# Patient Record
Sex: Male | Born: 1950 | Race: Black or African American | Hispanic: No | Marital: Married | Smoking: Former smoker
Health system: Southern US, Community
[De-identification: ages and names within clinical notes are randomized; demographics above are authoritative.]

## PROBLEM LIST (undated history)

## (undated) DIAGNOSIS — I509 Heart failure, unspecified: Secondary | ICD-10-CM

## (undated) DIAGNOSIS — K5792 Diverticulitis of intestine, part unspecified, without perforation or abscess without bleeding: Secondary | ICD-10-CM

## (undated) DIAGNOSIS — C801 Malignant (primary) neoplasm, unspecified: Secondary | ICD-10-CM

## (undated) DIAGNOSIS — I10 Essential (primary) hypertension: Secondary | ICD-10-CM

## (undated) DIAGNOSIS — I452 Bifascicular block: Secondary | ICD-10-CM

## (undated) DIAGNOSIS — K635 Polyp of colon: Secondary | ICD-10-CM

## (undated) DIAGNOSIS — M1990 Unspecified osteoarthritis, unspecified site: Secondary | ICD-10-CM

## (undated) DIAGNOSIS — I2510 Atherosclerotic heart disease of native coronary artery without angina pectoris: Secondary | ICD-10-CM

## (undated) DIAGNOSIS — Z87442 Personal history of urinary calculi: Secondary | ICD-10-CM

## (undated) DIAGNOSIS — K589 Irritable bowel syndrome without diarrhea: Secondary | ICD-10-CM

## (undated) DIAGNOSIS — I200 Unstable angina: Secondary | ICD-10-CM

## (undated) DIAGNOSIS — E119 Type 2 diabetes mellitus without complications: Secondary | ICD-10-CM

## (undated) HISTORY — DX: Polyp of colon: K63.5

## (undated) HISTORY — DX: Diverticulitis of intestine, part unspecified, without perforation or abscess without bleeding: K57.92

## (undated) HISTORY — DX: Essential (primary) hypertension: I10

## (undated) HISTORY — PX: OTHER SURGICAL HISTORY: SHX169

## (undated) HISTORY — PX: BREAST SURGERY: SHX581

## (undated) HISTORY — DX: Irritable bowel syndrome, unspecified: K58.9

## (undated) HISTORY — DX: Atherosclerotic heart disease of native coronary artery without angina pectoris: I25.10

## (undated) HISTORY — PX: PENILE PROSTHESIS IMPLANT: SHX240

## (undated) HISTORY — PX: APPENDECTOMY: SHX54

## (undated) HISTORY — PX: COLON SURGERY: SHX602

## (undated) HISTORY — PX: ABDOMINAL SURGERY: SHX537

---

## 1998-08-24 ENCOUNTER — Encounter: Admission: RE | Admit: 1998-08-24 | Discharge: 1998-11-22 | Payer: Self-pay | Admitting: Anesthesiology

## 2003-01-04 ENCOUNTER — Inpatient Hospital Stay (HOSPITAL_COMMUNITY): Admission: AD | Admit: 2003-01-04 | Discharge: 2003-01-07 | Payer: Self-pay | Admitting: Internal Medicine

## 2003-02-20 ENCOUNTER — Ambulatory Visit (HOSPITAL_COMMUNITY): Admission: RE | Admit: 2003-02-20 | Discharge: 2003-02-20 | Payer: Self-pay | Admitting: Internal Medicine

## 2004-11-12 ENCOUNTER — Ambulatory Visit: Payer: Self-pay | Admitting: Internal Medicine

## 2004-11-18 ENCOUNTER — Ambulatory Visit: Payer: Self-pay | Admitting: Internal Medicine

## 2004-12-06 ENCOUNTER — Ambulatory Visit: Payer: Self-pay | Admitting: Internal Medicine

## 2004-12-06 ENCOUNTER — Ambulatory Visit (HOSPITAL_COMMUNITY): Admission: RE | Admit: 2004-12-06 | Discharge: 2004-12-06 | Payer: Self-pay | Admitting: Internal Medicine

## 2004-12-20 ENCOUNTER — Ambulatory Visit: Payer: Self-pay | Admitting: Internal Medicine

## 2005-12-22 ENCOUNTER — Encounter: Payer: Self-pay | Admitting: Internal Medicine

## 2005-12-25 ENCOUNTER — Ambulatory Visit: Payer: Self-pay | Admitting: Cardiology

## 2005-12-30 ENCOUNTER — Ambulatory Visit: Payer: Self-pay | Admitting: Cardiology

## 2006-01-12 ENCOUNTER — Ambulatory Visit: Payer: Self-pay | Admitting: Cardiology

## 2006-01-29 ENCOUNTER — Ambulatory Visit: Payer: Self-pay | Admitting: Cardiology

## 2007-10-08 ENCOUNTER — Encounter: Payer: Self-pay | Admitting: Family Medicine

## 2011-03-06 ENCOUNTER — Ambulatory Visit (INDEPENDENT_AMBULATORY_CARE_PROVIDER_SITE_OTHER): Payer: BC Managed Care – PPO | Admitting: Internal Medicine

## 2011-03-06 ENCOUNTER — Encounter (INDEPENDENT_AMBULATORY_CARE_PROVIDER_SITE_OTHER): Payer: Self-pay | Admitting: Internal Medicine

## 2011-03-06 ENCOUNTER — Telehealth (INDEPENDENT_AMBULATORY_CARE_PROVIDER_SITE_OTHER): Payer: Self-pay | Admitting: *Deleted

## 2011-03-06 ENCOUNTER — Other Ambulatory Visit (INDEPENDENT_AMBULATORY_CARE_PROVIDER_SITE_OTHER): Payer: Self-pay | Admitting: *Deleted

## 2011-03-06 ENCOUNTER — Encounter (INDEPENDENT_AMBULATORY_CARE_PROVIDER_SITE_OTHER): Payer: Self-pay | Admitting: *Deleted

## 2011-03-06 VITALS — BP 102/62 | Temp 98.1°F | Ht 67.5 in | Wt 173.4 lb

## 2011-03-06 DIAGNOSIS — K625 Hemorrhage of anus and rectum: Secondary | ICD-10-CM

## 2011-03-06 DIAGNOSIS — D126 Benign neoplasm of colon, unspecified: Secondary | ICD-10-CM

## 2011-03-06 DIAGNOSIS — Z800 Family history of malignant neoplasm of digestive organs: Secondary | ICD-10-CM

## 2011-03-06 DIAGNOSIS — K635 Polyp of colon: Secondary | ICD-10-CM

## 2011-03-06 MED ORDER — PEG-KCL-NACL-NASULF-NA ASC-C 100 G PO SOLR: 1.0000 | Freq: Once | ORAL | Status: DC

## 2011-03-06 NOTE — Progress Notes (Signed)
Subjective:     Patient ID: Ronald Faulkner, male   DOB: 1950-09-21, 61 y.o.   MRN: [id number]  HPI Ronald Faulkner is a 60 yr old male referred to our office by Samuel Jester for rectal bleeding. He tells me he has seen a streak of blood down his stool and he has seen blood when he wiped. N Sometimes hiis stools are very hard. Noticed 2-3 months ago and last for a couple of days.  He saw the blood a couple of days ago.  There is a family hx of colon cancer in a first degree relative ((mother age 74).  He tells me that 2007 he has 12 inches of his colon removed due to diverticulitis by Dr. Cleotis Nipper.   His last colonoscopy was in 2008: no colonic or rectal polyps. Normal colon otherwise. Mild diverticulosis in the transverse and left colon. .  He has a hx of colon adenomas polyps.   Appetite is good. No weight loss. Sometimes if he eats he will become nauseated and sometimes if he doesn't eat he becomes nauseated. His BMs are normal. Stools are normal caliber.  Sometimes it hurts to have a BM.  He occasionally has diarrhea.  Review of Systems  See hpi Current Outpatient Prescriptions  Medication Sig Dispense Refill  . diphenhydrAMINE (BENADRYL) 25 mg capsule Take 25 mg by mouth every 6 (six) hours as needed.        Marland Kitchen ibuprofen (ADVIL,MOTRIN) 200 MG tablet Take 200 mg by mouth every 6 (six) hours as needed.        Marland Kitchen olmesartan-hydrochlorothiazide (BENICAR HCT) 20-12.5 MG per tablet Take 1 tablet by mouth daily.         Past Surgical History  Procedure Date  . Appendectomy   . Colon surgery   . Breast surgery     benign   Past Medical History  Diagnosis Date  . Hypertension    No family history on file. Family Status  Relation Status Death Age  . Mother Deceased     colon cancer  . Father Deceased     COPD  . Sister Alive     good health  . Brother Deceased     MVC  . Child Alive     son is a diabetic   History   Social History  . Marital Status: Married    Spouse Name: N/A    Number  of Children: N/A  . Years of Education: N/A   Occupational History  . Not on file.   Social History Main Topics  . Smoking status: Never Smoker   . Smokeless tobacco: Not on file  . Alcohol Use: Yes     One beer a day  . Drug Use: No  . Sexually Active: Not on file   Other Topics Concern  . Not on file   Social History Narrative  . No narrative on file   Allergies  Allergen Reactions  . Fish Allergy        Objective:   Physical Exam   Filed Vitals:   03/06/11 1538  BP: 102/62  Temp: 98.1 F (36.7 C)  Height: 5' 7.5" (1.715 m)  Weight: 173 lb 6.4 oz (78.654 kg)    Alert and oriented. Skin warm and dry. Oral mucosa is moist.  Dentures. Sclera anicteric, conjunctivae is pink. Thyroid not enlarged. No cervical lymphadenopathy. Lungs clear. Heart regular rate and rhythm.  Abdomen is soft. Bowel sounds are positive. No hepatomegaly. No abdominal masses felt.  Stool brown and guaiac negative. No tenderness.  No edema to lower extremities. Patient is alert and oriented.     Assessment:    Rectal bleeding. Family hx of colon cancer. Colonic neoplasm needs to be ruled out. Also has hx of colonic polyps.     Plan:      Colonoscopy.   The risks and benefits such as perforation, bleeding, and infection were reviewed with the patient and is agreeable.   Patient states he is satisfied with the care he received today.

## 2011-03-07 NOTE — Telephone Encounter (Signed)
rx filled by Delrae Rend

## 2011-03-13 ENCOUNTER — Encounter (INDEPENDENT_AMBULATORY_CARE_PROVIDER_SITE_OTHER): Payer: Self-pay | Admitting: *Deleted

## 2011-03-13 ENCOUNTER — Other Ambulatory Visit (INDEPENDENT_AMBULATORY_CARE_PROVIDER_SITE_OTHER): Payer: Self-pay | Admitting: Internal Medicine

## 2011-03-13 MED ORDER — SODIUM CHLORIDE 0.45 % IV SOLN: Freq: Once | INTRAVENOUS | Status: DC

## 2011-03-13 MED ORDER — PEG-KCL-NACL-NASULF-NA ASC-C 100 G PO SOLR: 1.0000 | Freq: Once | ORAL | Status: DC

## 2011-03-13 NOTE — Telephone Encounter (Signed)
This encounter was created in error - please disregard.

## 2011-03-14 ENCOUNTER — Encounter (HOSPITAL_COMMUNITY): Payer: Self-pay | Admitting: *Deleted

## 2011-03-14 ENCOUNTER — Ambulatory Visit (HOSPITAL_COMMUNITY)
Admission: RE | Admit: 2011-03-14 | Discharge: 2011-03-14 | Disposition: A | Discharge status: Home / Self Care | Payer: BC Managed Care – PPO | Source: Ambulatory Visit | Attending: Internal Medicine | Admitting: Internal Medicine

## 2011-03-14 ENCOUNTER — Encounter (HOSPITAL_COMMUNITY)
Admission: RE | Disposition: A | Discharge status: Home / Self Care | Payer: Self-pay | Source: Ambulatory Visit | Attending: Internal Medicine

## 2011-03-14 DIAGNOSIS — K5730 Diverticulosis of large intestine without perforation or abscess without bleeding: Secondary | ICD-10-CM

## 2011-03-14 DIAGNOSIS — Z860100 Personal history of colon polyps, unspecified: Secondary | ICD-10-CM | POA: Insufficient documentation

## 2011-03-14 DIAGNOSIS — I10 Essential (primary) hypertension: Secondary | ICD-10-CM | POA: Insufficient documentation

## 2011-03-14 DIAGNOSIS — Z800 Family history of malignant neoplasm of digestive organs: Secondary | ICD-10-CM

## 2011-03-14 DIAGNOSIS — K921 Melena: Secondary | ICD-10-CM | POA: Insufficient documentation

## 2011-03-14 DIAGNOSIS — K625 Hemorrhage of anus and rectum: Secondary | ICD-10-CM

## 2011-03-14 DIAGNOSIS — Z79899 Other long term (current) drug therapy: Secondary | ICD-10-CM | POA: Insufficient documentation

## 2011-03-14 DIAGNOSIS — K644 Residual hemorrhoidal skin tags: Secondary | ICD-10-CM

## 2011-03-14 DIAGNOSIS — K648 Other hemorrhoids: Secondary | ICD-10-CM | POA: Insufficient documentation

## 2011-03-14 HISTORY — DX: Unspecified osteoarthritis, unspecified site: M19.90

## 2011-03-14 HISTORY — PX: COLONOSCOPY: SHX5424

## 2011-03-14 SURGERY — COLONOSCOPY
Anesthesia: Moderate Sedation

## 2011-03-14 MED ORDER — MEPERIDINE HCL 50 MG/ML IJ SOLN
INTRAMUSCULAR | Status: AC
  Filled 2011-03-14: qty 1

## 2011-03-14 MED ORDER — MIDAZOLAM HCL 5 MG/5ML IJ SOLN
INTRAMUSCULAR | Status: AC
  Filled 2011-03-14: qty 10

## 2011-03-14 MED ORDER — STERILE WATER FOR IRRIGATION IR SOLN
Status: DC | PRN
  Administered 2011-03-14: 15:00:00

## 2011-03-14 MED ORDER — MEPERIDINE HCL 50 MG/ML IJ SOLN
INTRAMUSCULAR | Status: DC | PRN
  Administered 2011-03-14 (×2): 25 mg via INTRAVENOUS

## 2011-03-14 MED ORDER — MIDAZOLAM HCL 5 MG/5ML IJ SOLN
INTRAMUSCULAR | Status: DC | PRN
  Administered 2011-03-14 (×4): 2 mg via INTRAVENOUS

## 2011-03-14 NOTE — Op Note (Signed)
COLONOSCOPY PROCEDURE REPORT  PATIENT:  Ronald Faulkner  MR#:  [id number] Birthdate:  02-22-1951, 60 y.o., male Endoscopist:  Dr. Malissa Hippo, MD Referred By:  Dr. Samuel Jester,  DO Procedure Date: 03/14/2011  Procedure:   Colonoscopy  Indications:  Patient is 60 year old African American male with intermittent hematochezia history of colonic polyps and family history of colon carcinoma. His last colonoscopy was about 4 years ago.  Informed Consent: Procedure and risks were reviewed with the patient. His questions were answered and informed consent was obtained. Medications:  Demerol 50 mg IV Versed 10 mg IV  Description of procedure:  After a digital rectal exam was performed, that colonoscope was advanced from the anus through the rectum and colon to the area of the cecum, ileocecal valve and appendiceal orifice. The cecum was deeply intubated. These structures were well-seen and photographed for the record. From the level of the cecum and ileocecal valve, the scope was slowly and cautiously withdrawn. The mucosal surfaces were carefully surveyed utilizing scope tip to flexion to facilitate fold flattening as needed. The scope was pulled down into the rectum where a thorough exam including retroflexion was performed. TI was also examined.  Findings:   Prep excellent. Normal terminal ileum. Scattered diverticula throughout the colon. Wide open colonic anastomosis at 18 cm from the anal margin. Hemorrhoids below the dentate line.  Therapeutic/Diagnostic Maneuvers Performed:  None  Complications:  None  Cecal Withdrawal Time:  8 minutes  Impression:  Normal terminal ileum.[place] colonic diverticulosis. Wide open colonic anastomosis at 18 cm from anal margin. External hemorrhoids.  Recommendations:  Standard instructions given. High fiber diet plus fiber supplement 3-4 g daily. Next colonoscopy would be in 5 years.  REHMAN,NAJEEB U  03/14/2011 2:52 PM  CC: Dr.  Samuel Jester, DO, DO & Dr. No ref. provider found

## 2011-03-14 NOTE — H&P (Signed)
This is an update to history and physical from last week.. Since medications are history not changed since his last visit. He has intermittent hematochezia constipation, or street of colonic polyps and family history of colon carcinoma. His mother died at age 61 within 3 months of diagnosis was she had advanced disease

## 2011-03-25 ENCOUNTER — Encounter (HOSPITAL_COMMUNITY): Payer: Self-pay | Admitting: Internal Medicine

## 2011-10-13 ENCOUNTER — Encounter (INDEPENDENT_AMBULATORY_CARE_PROVIDER_SITE_OTHER): Payer: Self-pay

## 2011-11-03 ENCOUNTER — Other Ambulatory Visit: Payer: Self-pay | Admitting: *Deleted

## 2011-12-08 ENCOUNTER — Encounter: Payer: Self-pay | Admitting: Cardiology

## 2011-12-08 ENCOUNTER — Ambulatory Visit (INDEPENDENT_AMBULATORY_CARE_PROVIDER_SITE_OTHER): Payer: BC Managed Care – PPO | Admitting: Cardiology

## 2011-12-08 ENCOUNTER — Encounter: Payer: Self-pay | Admitting: *Deleted

## 2011-12-08 VITALS — BP 123/83 | HR 103 | Ht 67.5 in | Wt 176.8 lb

## 2011-12-08 DIAGNOSIS — R079 Chest pain, unspecified: Secondary | ICD-10-CM

## 2011-12-08 DIAGNOSIS — I10 Essential (primary) hypertension: Secondary | ICD-10-CM | POA: Insufficient documentation

## 2011-12-08 DIAGNOSIS — Z136 Encounter for screening for cardiovascular disorders: Secondary | ICD-10-CM

## 2011-12-08 DIAGNOSIS — R0789 Other chest pain: Secondary | ICD-10-CM

## 2011-12-08 DIAGNOSIS — R9431 Abnormal electrocardiogram [ECG] [EKG]: Secondary | ICD-10-CM

## 2011-12-08 HISTORY — DX: Abnormal electrocardiogram (ECG) (EKG): R94.31

## 2011-12-08 HISTORY — DX: Other chest pain: R07.89

## 2011-12-08 HISTORY — DX: Essential (primary) hypertension: I10

## 2011-12-08 NOTE — Assessment & Plan Note (Signed)
I will have a low threshold for an echocardiogram given the abnormal EKG.

## 2011-12-08 NOTE — Assessment & Plan Note (Signed)
The blood pressure is at target. No change in medications is indicated. We will continue with therapeutic lifestyle changes (TLC).  

## 2011-12-08 NOTE — Assessment & Plan Note (Signed)
His chest pain has some atypical greater than typical features. He does need screening with a stress test. Given the baseline abnormal EKG he will need a Lexiscan Myoview.

## 2011-12-08 NOTE — Progress Notes (Signed)
HPI The patient presents for evaluation of chest discomfort. His only past history includes palpitations and arrhythmia some years ago. He reports wearing the monitor in the past. He is referred now for evaluation of chest discomfort. This has been going on for a few weeks. He describes a left-sided pain under his left breast and into the left axilla.  This happens sporadically. He had a severe episode of a Saturday night. It lasted for about 5 minutes. He was a little bit nauseated and diaphoretic. He has some left hand numbness. 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 He thinks this is slowly progressive.  He does not describe substernal pressure, shoulder or arm discomfort or jaw discomfort. He does get some dyspnea with activity but is not describing PND or orthopnea. She's not noticing any palpitations, presyncope or syncope.  Allergies  Allergen Reactions  . Bee Venom   . Fish Allergy   . Ivp Dye (Iodinated Diagnostic Agents) Swelling    Current Outpatient Prescriptions  Medication Sig Dispense Refill  . allopurinol (ZYLOPRIM) 100 MG tablet Take 100 mg by mouth daily.       . Cholecalciferol 5000 UNITS TABS Take 5,000 Units by mouth daily.      . diphenhydrAMINE (BENADRYL) 25 mg capsule Take 50 mg by mouth as needed.       Marland Kitchen ibuprofen (ADVIL,MOTRIN) 200 MG tablet Take 200 mg by mouth every 6 (six) hours as needed.        Marland Kitchen olmesartan-hydrochlorothiazide (BENICAR HCT) 20-12.5 MG per tablet Take 1 tablet by mouth daily.        . simvastatin (ZOCOR) 40 MG tablet Take 40 mg by mouth at bedtime.         Past Medical History  Diagnosis Date  . Hypertension   . Arthritis   . Colon polyps   . Diverticulitis   . IBS (irritable bowel syndrome)     Past Surgical History  Procedure Date  . Appendectomy   . Colon surgery   . Breast surgery     benign lump at lt side  . Colonoscopy  03/14/2011    Procedure: COLONOSCOPY;  Surgeon: Malissa Hippo, MD;  Location: [facility];  Service: Endoscopy;  Laterality: N/A;  1:00    Family History  Problem Relation Age of Onset  . Colon cancer Mother   . Anesthesia problems Neg Hx   . Hypotension Neg Hx   . Malignant hyperthermia Neg Hx   . Pseudochol deficiency Neg Hx   . Coronary artery disease Maternal Grandmother     History   Social History  . Marital Status: Married    Spouse Name: N/A    Number of Children: 2  . Years of Education: N/A   Occupational History  .  [employer]   Social History Main Topics  . Smoking status: Former Smoker -- 1.0 packs/day for 35 years    Types: Cigarettes    Quit date: 06/03/1999  . Smokeless tobacco: Not on file  . Alcohol Use: 5.4 oz/week    5 Cans of beer, 1 Glasses of wine, 3 Shots of liquor per week  . Drug Use: No     hx of use in his colege days  . Sexually Active: Not on file   Other Topics Concern  . Not on file   Social History Narrative   Lives at home with  wife and daughter and her three children.      ROS:  As stated in the HPI and negative for all other systems.   PHYSICAL EXAM BP 123/83  Pulse 103  Ht 5' 7.5" (1.715 m)  Wt 176 lb 12.8 oz (80.196 kg)  BMI 27.28 kg/m2  SpO2 96% GENERAL:  Well appearing HEENT:  Pupils equal round and reactive, fundi not visualized, oral mucosa unremarkable, , upper denture NECK:  No jugular venous distention, waveform within normal limits, carotid upstroke brisk and symmetric, no bruits, no thyromegaly LYMPHATICS:  No cervical, inguinal adenopathy LUNGS:  Clear to auscultation bilaterally BACK:  No CVA tenderness CHEST:  Unremarkable HEART:  PMI not displaced or sustained,S1 and S2 within normal limits, no S3, no S4, no clicks, no rubs, no murmurs ABD:  Flat, positive bowel sounds normal in frequency in pitch, no bruits, no rebound, no guarding, no midline pulsatile mass, no hepatomegaly, no  splenomegaly EXT:  2 plus pulses throughout, no edema, no cyanosis no clubbing SKIN:  No rashes no nodules NEURO:  Cranial nerves II through XII grossly intact, motor grossly intact throughout [facility]:  Cognitively intact, oriented to person place and time  EKG:  NSR rate 103, RBBB.  12/08/2011  ASSESSMENT AND PLAN

## 2011-12-08 NOTE — Patient Instructions (Addendum)
Your physician recommends that you continue on your current medications as directed. Please refer to the Current Medication list given to you today. Your physician has requested that you have a lexiscan myoview. For further information please visit [email or web address]. Please follow instruction sheet, as given. We will call you with your results. 

## 2011-12-09 ENCOUNTER — Telehealth: Payer: Self-pay

## 2011-12-09 ENCOUNTER — Other Ambulatory Visit: Payer: Self-pay | Admitting: Cardiology

## 2011-12-09 DIAGNOSIS — R079 Chest pain, unspecified: Secondary | ICD-10-CM

## 2011-12-09 DIAGNOSIS — R9431 Abnormal electrocardiogram [ECG] [EKG]: Secondary | ICD-10-CM

## 2011-12-09 NOTE — Telephone Encounter (Signed)
LEXISCAN MYOVIEW scheduled for 7-[facility] Checking percert

## 2011-12-10 NOTE — Telephone Encounter (Signed)
Auth # [id number] exp 12/10/12. Per [place] @ [phone].

## 2011-12-11 DIAGNOSIS — R079 Chest pain, unspecified: Secondary | ICD-10-CM

## 2011-12-16 ENCOUNTER — Telehealth: Payer: Self-pay | Admitting: *Deleted

## 2011-12-16 DIAGNOSIS — R079 Chest pain, unspecified: Secondary | ICD-10-CM

## 2011-12-16 DIAGNOSIS — R9431 Abnormal electrocardiogram [ECG] [EKG]: Secondary | ICD-10-CM

## 2011-12-16 NOTE — Telephone Encounter (Signed)
Left message for patient to call office.  

## 2011-12-16 NOTE — Telephone Encounter (Signed)
Patient informed. Awaiting echo to be scheduled.

## 2011-12-16 NOTE — Telephone Encounter (Signed)
Message copied by Eustace Moore on Tue Dec 16, 2011 10:56 AM ------      Message from: Rollene Rotunda      Created: Fri Dec 12, 2011  4:08 PM       No evidence of ischemia.  However, abnormal EKG and possible RVH on myoview.  Check echocardiogram.

## 2011-12-31 ENCOUNTER — Other Ambulatory Visit: Payer: Self-pay

## 2011-12-31 ENCOUNTER — Other Ambulatory Visit (INDEPENDENT_AMBULATORY_CARE_PROVIDER_SITE_OTHER): Payer: BC Managed Care – PPO

## 2011-12-31 DIAGNOSIS — R9431 Abnormal electrocardiogram [ECG] [EKG]: Secondary | ICD-10-CM

## 2011-12-31 DIAGNOSIS — R079 Chest pain, unspecified: Secondary | ICD-10-CM

## 2012-04-05 ENCOUNTER — Ambulatory Visit (INDEPENDENT_AMBULATORY_CARE_PROVIDER_SITE_OTHER): Payer: BC Managed Care – PPO | Admitting: Internal Medicine

## 2012-04-05 ENCOUNTER — Encounter (INDEPENDENT_AMBULATORY_CARE_PROVIDER_SITE_OTHER): Payer: Self-pay | Admitting: Internal Medicine

## 2012-04-05 VITALS — BP 102/74 | HR 80 | Temp 97.6°F | Ht 67.5 in | Wt 174.8 lb

## 2012-04-05 DIAGNOSIS — K625 Hemorrhage of anus and rectum: Secondary | ICD-10-CM

## 2012-04-05 DIAGNOSIS — K219 Gastro-esophageal reflux disease without esophagitis: Secondary | ICD-10-CM

## 2012-04-05 HISTORY — DX: Hemorrhage of anus and rectum: K62.5

## 2012-04-05 MED ORDER — LANSOPRAZOLE 30 MG PO CPDR: 30.0000 mg | DELAYED_RELEASE_CAPSULE | Freq: Every day | ORAL | Status: DC

## 2012-04-05 MED ORDER — HYDROCORTISONE ACETATE 25 MG RE SUPP: 25.0000 mg | Freq: Two times a day (BID) | RECTAL | Status: AC

## 2012-04-05 NOTE — Patient Instructions (Addendum)
OV in 3 weeks. Anusol supp x 2 weeks. Stool diary.

## 2012-04-05 NOTE — Progress Notes (Signed)
Subjective:     Patient ID: Ronald Faulkner, male   DOB: Mar 27, 1951, 61 y.o.   MRN: [id number]  HPIPresents today with c/o that he has been seeing blood streaks in his stool. He also says when he wipes he sometimes sees blood. He has seen streaks of blood in his stools for a couple of months. This comes and goes. Stools are dark brown to light brown.  He has left lower quadrant pain off and on . He has the pain once or twice a week. No fever associated with the pain. He also has lower back pain. Appetite is good. No weight loss. Sometimes when he eats he becomes nauseated. He also tells me when he has a good bowel movement he becomes hot. He then may have diarrhea. He has a hx of polyps. Also c/o acid reflux. Mother deceased from colon cancer at age 4.  03/14/2011 Colonoscopy for rectal bleeding: Findings:  Prep excellent.  Normal terminal ileum.  Scattered diverticula throughout the colon.  Wide open colonic anastomosis at 18 cm from the anal margin.  Hemorrhoids below the dentate line.   He tells me that 2007 he has 12 inches of his colon removed due to diverticulitis by Dr. Cleotis Nipper.      Review of Systems see hpi Current Outpatient Prescriptions  Medication Sig Dispense Refill  . allopurinol (ZYLOPRIM) 100 MG tablet Take 100 mg by mouth daily.       . diphenhydrAMINE (BENADRYL) 25 mg capsule Take 50 mg by mouth as needed.       Marland Kitchen ibuprofen (ADVIL,MOTRIN) 200 MG tablet Take 200 mg by mouth every 6 (six) hours as needed.        Marland Kitchen olmesartan-hydrochlorothiazide (BENICAR HCT) 20-12.5 MG per tablet Take 1 tablet by mouth daily.        . Cholecalciferol 5000 UNITS TABS Take 5,000 Units by mouth daily.      . simvastatin (ZOCOR) 40 MG tablet Take 40 mg by mouth at bedtime.        Past Medical History  Diagnosis Date  . Hypertension   . Arthritis   . Colon polyps   . Diverticulitis   . IBS (irritable bowel syndrome)    History   Social History  . Marital Status: Married   Spouse Name: N/A    Number of Children: 2  . Years of Education: N/A   Occupational History  .  [employer]   Social History Main Topics  . Smoking status: Former Smoker -- 1.0 packs/day for 35 years    Types: Cigarettes    Quit date: 06/03/1999  . Smokeless tobacco: Not on file  . Alcohol Use: 5.4 oz/week    5 Cans of beer, 1 Glasses of wine, 3 Shots of liquor per week  . Drug Use: No     Comment: hx of use in his colege days  . Sexually Active: Not on file   Other Topics Concern  . Not on file   Social History Narrative   Lives at home with wife and daughter and her three children.     Allergies  Allergen Reactions  . Bee Venom   . Fish Allergy   . Ivp Dye (Iodinated Diagnostic Agents) Swelling   Family Status  Relation Status Death Age  . Mother Deceased     colon cancer  . Father Deceased     COPD  . Sister Alive     good health  . Brother Deceased  MVC  . Child Alive     son is a diabetic        Objective:   Physical Exam  Filed Vitals:   04/05/12 1120  BP: 102/74  Pulse: 80  Temp: 97.6 F (36.4 C)  Height: 5' 7.5" (1.715 m)  Weight: 174 lb 12.8 oz (79.289 kg)   Alert and oriented. Skin warm and dry. Oral mucosa is moist.   . Sclera anicteric, conjunctivae is pink. Thyroid not enlarged. No cervical lymphadenopathy. Lungs clear. Heart regular rate and rhythm.  Abdomen is soft. Bowel sounds are positive. No hepatomegaly. No abdominal masses felt. No tenderness.  No edema to lower extremities.      Assessment:    Rectal bleeding on and off. Possible hemorrhoidal. I discussed with Dr Karilyn Cota   GERD Plan:    Rx for Prevacid to his pharmacy. Anusol supp x 2 weeks. Stool diary. If remains asymptomatic will try Canasa. OV in 3 weeks.

## 2012-04-26 ENCOUNTER — Ambulatory Visit (INDEPENDENT_AMBULATORY_CARE_PROVIDER_SITE_OTHER): Payer: BC Managed Care – PPO | Admitting: Internal Medicine

## 2013-03-02 ENCOUNTER — Other Ambulatory Visit: Payer: Self-pay | Admitting: *Deleted

## 2013-03-02 DIAGNOSIS — M542 Cervicalgia: Secondary | ICD-10-CM

## 2013-03-02 DIAGNOSIS — M5126 Other intervertebral disc displacement, lumbar region: Secondary | ICD-10-CM

## 2013-03-02 DIAGNOSIS — M545 Low back pain, unspecified: Secondary | ICD-10-CM

## 2013-03-08 ENCOUNTER — Ambulatory Visit
Admission: RE | Admit: 2013-03-08 | Discharge: 2013-03-08 | Disposition: A | Discharge status: Home / Self Care | Payer: BC Managed Care – PPO | Source: Ambulatory Visit | Attending: *Deleted | Admitting: *Deleted

## 2013-03-08 DIAGNOSIS — M545 Low back pain, unspecified: Secondary | ICD-10-CM

## 2013-03-08 DIAGNOSIS — M5126 Other intervertebral disc displacement, lumbar region: Secondary | ICD-10-CM

## 2013-03-08 DIAGNOSIS — M542 Cervicalgia: Secondary | ICD-10-CM

## 2014-04-17 DIAGNOSIS — I452 Bifascicular block: Secondary | ICD-10-CM | POA: Insufficient documentation

## 2014-04-21 DIAGNOSIS — C61 Malignant neoplasm of prostate: Secondary | ICD-10-CM | POA: Insufficient documentation

## 2014-05-26 ENCOUNTER — Emergency Department (HOSPITAL_COMMUNITY)
Admission: EM | Admit: 2014-05-26 | Discharge: 2014-05-26 | Disposition: A | Discharge status: Home / Self Care | Payer: BC Managed Care – PPO | Attending: Emergency Medicine | Admitting: Emergency Medicine

## 2014-05-26 ENCOUNTER — Encounter (HOSPITAL_COMMUNITY): Payer: Self-pay | Admitting: Emergency Medicine

## 2014-05-26 DIAGNOSIS — E119 Type 2 diabetes mellitus without complications: Secondary | ICD-10-CM | POA: Diagnosis not present

## 2014-05-26 DIAGNOSIS — I10 Essential (primary) hypertension: Secondary | ICD-10-CM | POA: Insufficient documentation

## 2014-05-26 DIAGNOSIS — Z8719 Personal history of other diseases of the digestive system: Secondary | ICD-10-CM | POA: Diagnosis not present

## 2014-05-26 DIAGNOSIS — Z86010 Personal history of colonic polyps: Secondary | ICD-10-CM | POA: Insufficient documentation

## 2014-05-26 DIAGNOSIS — T814XXA Infection following a procedure, initial encounter: Secondary | ICD-10-CM | POA: Insufficient documentation

## 2014-05-26 DIAGNOSIS — Z87891 Personal history of nicotine dependence: Secondary | ICD-10-CM | POA: Diagnosis not present

## 2014-05-26 DIAGNOSIS — Y838 Other surgical procedures as the cause of abnormal reaction of the patient, or of later complication, without mention of misadventure at the time of the procedure: Secondary | ICD-10-CM | POA: Diagnosis not present

## 2014-05-26 DIAGNOSIS — Z79899 Other long term (current) drug therapy: Secondary | ICD-10-CM | POA: Diagnosis not present

## 2014-05-26 DIAGNOSIS — M1990 Unspecified osteoarthritis, unspecified site: Secondary | ICD-10-CM | POA: Diagnosis not present

## 2014-05-26 DIAGNOSIS — Z859 Personal history of malignant neoplasm, unspecified: Secondary | ICD-10-CM | POA: Insufficient documentation

## 2014-05-26 DIAGNOSIS — G8918 Other acute postprocedural pain: Secondary | ICD-10-CM | POA: Diagnosis present

## 2014-05-26 DIAGNOSIS — IMO0001 Reserved for inherently not codable concepts without codable children: Secondary | ICD-10-CM

## 2014-05-26 HISTORY — DX: Type 2 diabetes mellitus without complications: E11.9

## 2014-05-26 HISTORY — DX: Malignant (primary) neoplasm, unspecified: C80.1

## 2014-05-26 LAB — CBG MONITORING, ED: Glucose-Capillary: 118 mg/dL — ABNORMAL HIGH (ref 70–99)

## 2014-05-26 MED ORDER — DOXYCYCLINE HYCLATE 100 MG PO CAPS: 100.0000 mg | ORAL_CAPSULE | Freq: Two times a day (BID) | ORAL | Status: DC

## 2014-05-26 NOTE — Discharge Instructions (Signed)

## 2014-05-26 NOTE — ED Provider Notes (Signed)
CSN: [id number]     Arrival date & time 05/26/14  1359 History  This chart was scribed for [employer]. Ronald Chapel, MD by Ronald Faulkner, ED Scribe. This patient was seen in room [facility] and the patient's care was started at 3:04 PM.    Chief Complaint  Patient presents with  . Post-op Problem   The history is provided by the patient. No language interpreter was used.    HPI Comments: Ronald Faulkner is a 63 y.o. male who presents to the Emergency Department complaining of abdominal pain to a surgical site just above his naval status post laparoscopic surgery to treat prostate problem on 11/20. He states he has been doing well until he he had a sharp pain 3 days ago; he reports gradually increasing erythema and tenderness since then. He states there was a scab that fell off, after which he cleaned with antibiotic ointment, and then another scab formed; he states he had purulent drainage from it 2 days ago. He reports history of diabetes and states his blood sugar readings have been good recently. He states he has not recently used any antibiotics though he finished a 30 day course of Cipro when first diagnosed with cancer some time ago. He denies fever.   Past Medical History  Diagnosis Date  . Hypertension   . Arthritis   . Colon polyps   . Diverticulitis   . IBS (irritable bowel syndrome)   . Cancer   . Diabetes mellitus without complication    Past Surgical History  Procedure Laterality Date  . Appendectomy    . Colon surgery    . Breast surgery      benign lump at lt side  . Colonoscopy  03/14/2011    Procedure: COLONOSCOPY;  Surgeon: Ronald Houston, MD;  Location: [facility];  Service: Endoscopy;  Laterality: N/A;  1:00  . Abdominal surgery     Family History  Problem Relation Age of Onset  . Colon cancer Mother   . Anesthesia problems Neg Hx   . Hypotension Neg Hx   . Malignant hyperthermia Neg Hx   . Pseudochol deficiency Neg Hx   . Coronary artery disease Maternal  Grandmother    History  Substance Use Topics  . Smoking status: Former Smoker -- 1.00 packs/day for 35 years    Types: Cigarettes    Quit date: 06/03/1999  . Smokeless tobacco: Never Used  . Alcohol Use: 5.4 oz/week    1 Glasses of wine, 5 Cans of beer, 3 Shots of liquor per week    Review of Systems  Constitutional: Negative for fever and fatigue.  Respiratory: Negative for shortness of breath.   Gastrointestinal: Positive for abdominal pain. Negative for nausea and vomiting.  Skin: Positive for color change and wound.  All other systems reviewed and are negative.     Allergies  Bee venom; Fish allergy; and Ivp dye  Home Medications   Prior to Admission medications   Medication Sig Start Date End Date Taking? Authorizing Provider  allopurinol (ZYLOPRIM) 100 MG tablet Take 100 mg by mouth at bedtime.  11/10/11  Yes Historical Provider, MD  HYDROcodone-acetaminophen (NORCO/VICODIN) 5-325 MG per tablet Take 1 tablet by mouth daily as needed for moderate pain.  04/25/14  Yes Historical Provider, MD  ibuprofen (ADVIL,MOTRIN) 200 MG tablet Take 200 mg by mouth every 6 (six) hours as needed for moderate pain.    Yes Historical Provider, MD  metFORMIN (GLUCOPHAGE) 500 MG tablet Take 1 tablet  by mouth at bedtime.  05/21/14  Yes Historical Provider, MD  valsartan-hydrochlorothiazide (DIOVAN-HCT) 160-12.5 MG per tablet Take 1 tablet by mouth at bedtime.  05/13/14  Yes Historical Provider, MD  lansoprazole (PREVACID) 30 MG capsule Take 1 capsule (30 mg total) by mouth daily. Patient not taking: Reported on 05/26/2014 04/05/12   Ronald Penny, NP  simvastatin (ZOCOR) 40 MG tablet Take 40 mg by mouth at bedtime.  11/10/11   Historical Provider, MD   BP 122/76 mmHg  Pulse 99  Temp(Src) 97.8 F (36.6 C) (Oral)  Resp 16  SpO2 100% Physical Exam  Constitutional: He appears well-developed.  Cardiovascular: Normal rate.   Abdominal: Soft. There is no tenderness.  Skin:  Scab in  supraumbilical area. Mild purulent drainage. No fluctuance. Slight fullness in the area.     ED Course  Procedures (including critical care time)  COORDINATION OF CARE: 3:13 PM Discussed treatment plan to treat with antibiotics with patient at beside. Instructed him to use warm soaks and apply mild pressure to remove pus. Discussed with him that his lab results are not acute and that I do not think further workup is necessary. The patient agrees with the plan and has no further questions at this time.  Labs Review Labs Reviewed  CBG MONITORING, ED - Abnormal; Notable for the following:    Glucose-Capillary 118 (*)    All other components within normal limits    Imaging Review No results found.   EKG Interpretation None      MDM   Final diagnoses:  None    Patient with infection at one of the sites from his prostate surgery. Some slight fullness and slight purulent drainage of which culture was sent. Does not appear to be a large abscess. There is slight surrounding redness. Will treat with antibiotics. Appears to be superficially on the skin and doubt this is very deep. Will follow-up as needed with his surgeon.  I personally performed the services described in this documentation, which was scribed in my presence. The recorded information has been reviewed and is accurate.     Ronald Faulkner. Ronald Chapel, MD 05/26/14 [phone]

## 2014-05-26 NOTE — ED Notes (Signed)
Patient c/o pain in abd above naval. Per patient had surgery in laparoscopic surgery in that area in Novermber. Healing wound noted. Area red, warm, and tender with touch. Patient does report some drainage.

## 2014-05-29 LAB — WOUND CULTURE

## 2014-05-30 ENCOUNTER — Telehealth (HOSPITAL_BASED_OUTPATIENT_CLINIC_OR_DEPARTMENT_OTHER): Payer: Self-pay | Admitting: Emergency Medicine

## 2014-05-30 NOTE — Telephone Encounter (Signed)
Post ED Visit - Positive Culture Follow-up  Culture report reviewed by antimicrobial stewardship pharmacist: []  Wes Dulaney, Pharm.D., BCPS [x]  Heide Guile, Pharm.D., BCPS []  Alycia Rossetti, Pharm.D., BCPS []  [place], Pharm.D., BCPS, AAHIVP []  Legrand Como, Pharm.D., BCPS, AAHIVP []  [name], Pharm.D., BCPS  Positive wound culture Staph Aureus Treated with doxycycline, organism sensitive to the same and no further patient follow-up is required at this time.  Hazle Nordmann 05/30/2014, 11:55 AM

## 2015-01-23 DIAGNOSIS — N393 Stress incontinence (female) (male): Secondary | ICD-10-CM | POA: Insufficient documentation

## 2015-01-23 DIAGNOSIS — N5231 Erectile dysfunction following radical prostatectomy: Secondary | ICD-10-CM | POA: Insufficient documentation

## 2015-04-13 DIAGNOSIS — J309 Allergic rhinitis, unspecified: Secondary | ICD-10-CM | POA: Insufficient documentation

## 2015-06-12 DIAGNOSIS — M5030 Other cervical disc degeneration, unspecified cervical region: Secondary | ICD-10-CM | POA: Diagnosis not present

## 2015-06-12 DIAGNOSIS — K589 Irritable bowel syndrome without diarrhea: Secondary | ICD-10-CM | POA: Diagnosis not present

## 2015-06-12 DIAGNOSIS — E785 Hyperlipidemia, unspecified: Secondary | ICD-10-CM | POA: Diagnosis not present

## 2015-06-12 DIAGNOSIS — I10 Essential (primary) hypertension: Secondary | ICD-10-CM | POA: Diagnosis not present

## 2015-06-12 DIAGNOSIS — E119 Type 2 diabetes mellitus without complications: Secondary | ICD-10-CM | POA: Diagnosis not present

## 2015-06-12 DIAGNOSIS — K219 Gastro-esophageal reflux disease without esophagitis: Secondary | ICD-10-CM | POA: Diagnosis not present

## 2015-06-12 DIAGNOSIS — M9901 Segmental and somatic dysfunction of cervical region: Secondary | ICD-10-CM | POA: Diagnosis not present

## 2015-06-12 DIAGNOSIS — M109 Gout, unspecified: Secondary | ICD-10-CM | POA: Diagnosis not present

## 2015-06-19 DIAGNOSIS — M2550 Pain in unspecified joint: Secondary | ICD-10-CM | POA: Diagnosis not present

## 2015-06-19 DIAGNOSIS — M79671 Pain in right foot: Secondary | ICD-10-CM | POA: Diagnosis not present

## 2015-06-19 DIAGNOSIS — M1A09X0 Idiopathic chronic gout, multiple sites, without tophus (tophi): Secondary | ICD-10-CM | POA: Diagnosis not present

## 2015-06-19 DIAGNOSIS — M79672 Pain in left foot: Secondary | ICD-10-CM | POA: Diagnosis not present

## 2015-07-03 DIAGNOSIS — M1A09X0 Idiopathic chronic gout, multiple sites, without tophus (tophi): Secondary | ICD-10-CM | POA: Diagnosis not present

## 2015-07-03 DIAGNOSIS — M79671 Pain in right foot: Secondary | ICD-10-CM | POA: Diagnosis not present

## 2015-07-03 DIAGNOSIS — M79672 Pain in left foot: Secondary | ICD-10-CM | POA: Diagnosis not present

## 2015-07-03 DIAGNOSIS — M2550 Pain in unspecified joint: Secondary | ICD-10-CM | POA: Diagnosis not present

## 2015-07-31 DIAGNOSIS — M2550 Pain in unspecified joint: Secondary | ICD-10-CM | POA: Diagnosis not present

## 2015-07-31 DIAGNOSIS — M79672 Pain in left foot: Secondary | ICD-10-CM | POA: Diagnosis not present

## 2015-07-31 DIAGNOSIS — M1A09X0 Idiopathic chronic gout, multiple sites, without tophus (tophi): Secondary | ICD-10-CM | POA: Diagnosis not present

## 2015-07-31 DIAGNOSIS — M79671 Pain in right foot: Secondary | ICD-10-CM | POA: Diagnosis not present

## 2015-08-28 DIAGNOSIS — I10 Essential (primary) hypertension: Secondary | ICD-10-CM | POA: Diagnosis not present

## 2015-08-28 DIAGNOSIS — M5136 Other intervertebral disc degeneration, lumbar region: Secondary | ICD-10-CM | POA: Diagnosis not present

## 2015-08-28 DIAGNOSIS — C61 Malignant neoplasm of prostate: Secondary | ICD-10-CM | POA: Diagnosis not present

## 2015-08-28 DIAGNOSIS — M109 Gout, unspecified: Secondary | ICD-10-CM | POA: Diagnosis not present

## 2015-09-18 ENCOUNTER — Ambulatory Visit (INDEPENDENT_AMBULATORY_CARE_PROVIDER_SITE_OTHER): Payer: PPO | Admitting: Internal Medicine

## 2015-09-18 ENCOUNTER — Other Ambulatory Visit (INDEPENDENT_AMBULATORY_CARE_PROVIDER_SITE_OTHER): Payer: Self-pay | Admitting: Internal Medicine

## 2015-09-18 ENCOUNTER — Encounter (INDEPENDENT_AMBULATORY_CARE_PROVIDER_SITE_OTHER): Payer: Self-pay | Admitting: Internal Medicine

## 2015-09-18 VITALS — BP 122/72 | HR 60 | Temp 97.8°F | Ht 67.0 in | Wt 168.5 lb

## 2015-09-18 DIAGNOSIS — N393 Stress incontinence (female) (male): Secondary | ICD-10-CM | POA: Diagnosis not present

## 2015-09-18 DIAGNOSIS — N5231 Erectile dysfunction following radical prostatectomy: Secondary | ICD-10-CM | POA: Diagnosis not present

## 2015-09-18 DIAGNOSIS — Z800 Family history of malignant neoplasm of digestive organs: Secondary | ICD-10-CM

## 2015-09-18 DIAGNOSIS — E119 Type 2 diabetes mellitus without complications: Secondary | ICD-10-CM | POA: Diagnosis not present

## 2015-09-18 DIAGNOSIS — Z86010 Personal history of colonic polyps: Secondary | ICD-10-CM

## 2015-09-18 DIAGNOSIS — K921 Melena: Secondary | ICD-10-CM | POA: Diagnosis not present

## 2015-09-18 DIAGNOSIS — Z8546 Personal history of malignant neoplasm of prostate: Secondary | ICD-10-CM | POA: Diagnosis not present

## 2015-09-18 DIAGNOSIS — M109 Gout, unspecified: Secondary | ICD-10-CM | POA: Insufficient documentation

## 2015-09-18 NOTE — Progress Notes (Signed)
Subjective:    Patient ID: Ronald Faulkner, male    DOB: 01/14/1951, 65 y.o.   MRN: [id number]  HPI  Here today for scheduled visit. His last colonoscopy was in 2012. Family hx of colon cancer. He tells me he has hx of colonic polyps. He is very concerned because mother had colon cancer at age 69.  He tells me that 2007 he has 12 inches of his colon removed due to diverticulitis by Dr. Lindalou Hose.     There is a family hx of colon cancer in a first degree relative ((mother age 78).   Appetite is good.  He does have some nausea after eating. Occasionally has acid reflux He usually has a BM daily if he forces himself. He alternates between constipation and diarrhea. If he eats a big salad he may have a good BM.   His stools are usually normal size and sometimes they look like little slivers.  No melena or BRRB.   03/14/2011   Colonoscopy  Indications:  Patient is 65 year old African American male with intermittent hematochezia history of colonic polyps and family history of colon carcinoma. His last colonoscopy was about 4 years ago.  Impression:   Normal terminal ileum.Linward Headland colonic diverticulosis. Wide open colonic anastomosis at 18 cm from anal margin. External hemorrhoids.     Review of Systems Past Medical History  Diagnosis Date  . Hypertension   . Arthritis   . Colon polyps   . Diverticulitis   . IBS (irritable bowel syndrome)   . Cancer ([place])   . Diabetes mellitus without complication [facility])     Past Surgical History  Procedure Laterality Date  . Appendectomy    . Colon surgery    . Breast surgery      benign lump at lt side  . Colonoscopy  03/14/2011    Procedure: COLONOSCOPY;  Surgeon: [name], MD;  [facility];  Service: Endoscopy;  Laterality: N/A;  1:00  . Abdominal surgery    . Prostate cancer      2015. prostectomy  . Penile prosthesis implant      2016     Allergies  Allergen Reactions  . Bee Venom   . Fish Allergy   . Ivp Dye  [Iodinated Diagnostic Agents] Swelling    Current Outpatient Prescriptions on File Prior to Visit  Medication Sig Dispense Refill  . allopurinol (ZYLOPRIM) 100 MG tablet Take 100 mg by mouth at bedtime.     Marland Kitchen HYDROcodone-acetaminophen (NORCO/VICODIN) 5-325 MG per tablet Take 1 tablet by mouth daily as needed for moderate pain.     Marland Kitchen ibuprofen (ADVIL,MOTRIN) 200 MG tablet Take 200 mg by mouth every 6 (six) hours as needed for moderate pain.     . metFORMIN (GLUCOPHAGE) 500 MG tablet Take 1 tablet by mouth 2 (two) times daily with a meal.     . valsartan-hydrochlorothiazide (DIOVAN-HCT) 160-12.5 MG per tablet Take 1 tablet by mouth at bedtime.     . lansoprazole (PREVACID) 30 MG capsule Take 1 capsule (30 mg total) by mouth daily. (Patient not taking: Reported on 05/26/2014) 90 capsule 3   No current facility-administered medications on file prior to visit.        Objective:   Physical Exam Blood pressure 122/72, pulse 60, temperature 97.8 F (36.6 C), height 5\' 7"  (1.702 m), weight 168 lb 8 oz (76.431 kg). Alert and oriented. Skin warm and dry. Oral mucosa is moist.   . Sclera anicteric, conjunctivae  is pink. Thyroid not enlarged. No cervical lymphadenopathy. Lungs clear. Heart regular rate and rhythm.  Abdomen is soft. Bowel sounds are positive. No hepatomegaly. No abdominal masses felt. No tenderness.  No edema to lower extremities.           Assessment & Plan:  Family hx of colon cancer. Needs surveillance. Constipation. Will give him samples of Linzess and see if he helps.

## 2015-09-18 NOTE — Patient Instructions (Addendum)
Colonoscopy. The risks and benefits such as perforation, bleeding, and infection were reviewed with the patient and is agreeable. Samples of Liness given to patient. Take one a day.

## 2015-09-19 ENCOUNTER — Other Ambulatory Visit (INDEPENDENT_AMBULATORY_CARE_PROVIDER_SITE_OTHER): Payer: Self-pay | Admitting: *Deleted

## 2015-09-19 ENCOUNTER — Encounter (INDEPENDENT_AMBULATORY_CARE_PROVIDER_SITE_OTHER): Payer: Self-pay | Admitting: *Deleted

## 2015-09-19 NOTE — Telephone Encounter (Signed)
Patient needs trilyte 

## 2015-09-25 MED ORDER — PEG 3350-KCL-NA BICARB-NACL 420 G PO SOLR: 4000.0000 mL | Freq: Once | ORAL | Status: DC

## 2015-09-28 DIAGNOSIS — G609 Hereditary and idiopathic neuropathy, unspecified: Secondary | ICD-10-CM | POA: Diagnosis not present

## 2015-09-28 DIAGNOSIS — M1A09X0 Idiopathic chronic gout, multiple sites, without tophus (tophi): Secondary | ICD-10-CM | POA: Diagnosis not present

## 2015-09-28 DIAGNOSIS — M2550 Pain in unspecified joint: Secondary | ICD-10-CM | POA: Diagnosis not present

## 2015-09-28 DIAGNOSIS — K210 Gastro-esophageal reflux disease with esophagitis: Secondary | ICD-10-CM | POA: Diagnosis not present

## 2015-10-16 ENCOUNTER — Encounter (HOSPITAL_COMMUNITY): Payer: Self-pay

## 2015-10-16 ENCOUNTER — Emergency Department (HOSPITAL_COMMUNITY)
Admission: EM | Admit: 2015-10-16 | Discharge: 2015-10-16 | Disposition: A | Discharge status: Home / Self Care | Payer: PPO | Attending: Emergency Medicine | Admitting: Emergency Medicine

## 2015-10-16 DIAGNOSIS — Z791 Long term (current) use of non-steroidal anti-inflammatories (NSAID): Secondary | ICD-10-CM | POA: Diagnosis not present

## 2015-10-16 DIAGNOSIS — M542 Cervicalgia: Secondary | ICD-10-CM | POA: Diagnosis not present

## 2015-10-16 DIAGNOSIS — I10 Essential (primary) hypertension: Secondary | ICD-10-CM | POA: Insufficient documentation

## 2015-10-16 DIAGNOSIS — M1990 Unspecified osteoarthritis, unspecified site: Secondary | ICD-10-CM | POA: Insufficient documentation

## 2015-10-16 DIAGNOSIS — E119 Type 2 diabetes mellitus without complications: Secondary | ICD-10-CM | POA: Insufficient documentation

## 2015-10-16 DIAGNOSIS — M5030 Other cervical disc degeneration, unspecified cervical region: Secondary | ICD-10-CM

## 2015-10-16 DIAGNOSIS — Z7984 Long term (current) use of oral hypoglycemic drugs: Secondary | ICD-10-CM | POA: Diagnosis not present

## 2015-10-16 DIAGNOSIS — Z79899 Other long term (current) drug therapy: Secondary | ICD-10-CM | POA: Insufficient documentation

## 2015-10-16 DIAGNOSIS — M5412 Radiculopathy, cervical region: Secondary | ICD-10-CM | POA: Diagnosis not present

## 2015-10-16 DIAGNOSIS — Z87891 Personal history of nicotine dependence: Secondary | ICD-10-CM | POA: Insufficient documentation

## 2015-10-16 MED ORDER — DIAZEPAM 10 MG PO TABS: 10.0000 mg | ORAL_TABLET | Freq: Four times a day (QID) | ORAL | Status: DC | PRN

## 2015-10-16 MED ORDER — PREDNISONE 20 MG PO TABS: 20.0000 mg | ORAL_TABLET | Freq: Two times a day (BID) | ORAL | Status: DC

## 2015-10-16 MED ORDER — HYDROCODONE-ACETAMINOPHEN 5-325 MG PO TABS: 1.0000 | ORAL_TABLET | ORAL | Status: DC | PRN

## 2015-10-16 NOTE — ED Notes (Signed)
EDP at bedside  

## 2015-10-16 NOTE — ED Provider Notes (Signed)
CSN: [id number]     Arrival date & time 10/16/15  1322 History   First MD Initiated Contact with Patient 10/16/15 1409     Chief Complaint  Patient presents with  . Neck Pain     (Consider location/radiation/quality/duration/timing/severity/associated sxs/prior Treatment) HPI   Ronald Faulkner is a 65 y.o. male was here for evaluation of left-sided neck pain radiating to the left arm and left upper chest, for 4 days. The pain is persistent. The pain is worse with movement of his neck but not his left shoulder. No recent trauma. He has been told that he has degenerative changes in the cervical spine. No prior neck surgery. No recent fever or chills. He has not tried anything for the pain, yet. There are no other known modifying factors.   Past Medical History  Diagnosis Date  . Hypertension   . Arthritis   . Colon polyps   . Diverticulitis   . IBS (irritable bowel syndrome)   . Cancer ([place])   . Diabetes mellitus without complication [facility])    Past Surgical History  Procedure Laterality Date  . Appendectomy    . Colon surgery    . Breast surgery      benign lump at lt side  . Colonoscopy  03/14/2011    Procedure: COLONOSCOPY;  Surgeon: [name], MD;  Location[facility];  Service: Endoscopy;  Laterality: N/A;  1:00  . Abdominal surgery    . Prostate cancer      2015. prostectomy  . Penile prosthesis implant      2016    Family History  Problem Relation Age of Onset  . Colon cancer Mother   . Anesthesia problems Neg Hx   . Hypotension Neg Hx   . Malignant hyperthermia Neg Hx   . Pseudochol deficiency Neg Hx   . Coronary artery disease Maternal Grandmother    Social History  Substance Use Topics  . Smoking status: Former Smoker -- 1.00 packs/day for 35 years    Types: Cigarettes    Quit date: 06/03/1999  . Smokeless tobacco: Never Used  . Alcohol Use: Yes     Comment: occ    Review of Systems  All other systems reviewed and are  negative.     Allergies  Bee venom; Fish allergy; and Ivp dye  Home Medications   Prior to Admission medications   Medication Sig Start Date End Date Taking? Authorizing Provider  allopurinol (ZYLOPRIM) 100 MG tablet Take 300 mg by mouth 2 (two) times daily.  11/10/11  Yes Historical Provider, MD  gabapentin (NEURONTIN) 100 MG capsule Take 300 mg by mouth 2 (two) times daily.    Yes Historical Provider, MD  ibuprofen (ADVIL,MOTRIN) 200 MG tablet Take 200 mg by mouth every 6 (six) hours as needed for moderate pain.    Yes Historical Provider, MD  linaclotide (LINZESS) 145 MCG CAPS capsule Take 145 mcg by mouth daily before breakfast.   Yes Historical Provider, MD  loratadine (CLARITIN) 10 MG tablet Take 10 mg by mouth daily as needed for allergies.   Yes Historical Provider, MD  metFORMIN (GLUCOPHAGE) 500 MG tablet Take 1 tablet by mouth 2 (two) times daily with a meal.  05/21/14  Yes Historical Provider, MD  oxymetazoline (12 HOUR NASAL SPRAY) 0.05 % nasal spray Place 1 spray into both nostrils 2 (two) times daily.    Yes Historical Provider, MD  valsartan-hydrochlorothiazide (DIOVAN-HCT) 160-12.5 MG per tablet Take 1 tablet by mouth daily.  05/13/14  Yes Historical Provider, MD  diazepam (VALIUM) 10 MG tablet Take 1 tablet (10 mg total) by mouth every 6 (six) hours as needed (Muscle spasm). 10/16/15   Daleen Bo, MD  HYDROcodone-acetaminophen (NORCO) 5-325 MG tablet Take 1 tablet by mouth every 4 (four) hours as needed. 10/16/15   Daleen Bo, MD  lansoprazole (PREVACID) 30 MG capsule Take 1 capsule (30 mg total) by mouth daily. Patient not taking: Reported on 05/26/2014 04/05/12   Butch Penny, NP  polyethylene glycol-electrolytes (NULYTELY/GOLYTELY) 420 g solution Take 4,000 mLs by mouth once. 09/25/15   Butch Penny, NP  predniSONE (DELTASONE) 20 MG tablet Take 1 tablet (20 mg total) by mouth 2 (two) times daily. 10/16/15   Daleen Bo, MD   BP 132/84 mmHg  Pulse 84  Temp(Src)  98.4 F (36.9 C) (Temporal)  Resp 16  Ht 5\' 7"  (1.702 m)  Wt 168 lb (76.204 kg)  BMI 26.31 kg/m2  SpO2 96% Physical Exam  Constitutional: He is oriented to person, place, and time. He appears well-developed and well-nourished. No distress.  HENT:  Head: Normocephalic and atraumatic.  Right Ear: External ear normal.  Left Ear: External ear normal.  Eyes: Conjunctivae and EOM are normal. Pupils are equal, round, and reactive to light.  Neck: Normal range of motion and phonation normal. Neck supple.  Cardiovascular: Normal rate, regular rhythm and normal heart sounds.   Pulmonary/Chest: Effort normal and breath sounds normal. He exhibits no bony tenderness.  Abdominal: Soft. There is no tenderness.  Musculoskeletal: Normal range of motion.  Mild tenderness of the left trapezius, and left deltoid. Somewhat diminished range of motion of the neck with left rotation, and bending. Normal range of motion, arms and legs bilaterally.  Neurological: He is alert and oriented to person, place, and time. No cranial nerve deficit or sensory deficit. He exhibits normal muscle tone. Coordination normal.  Normal sensation left hand.  Skin: Skin is warm, dry and intact.  Psychiatric: He has a normal mood and affect. His behavior is normal. Judgment and thought content normal.  Nursing note and vitals reviewed.   ED Course  Procedures (including critical care time)  Medications - No data to display  Patient Vitals for the past 24 hrs:  BP Temp Temp src Pulse Resp SpO2 Height Weight  10/16/15 1610 132/84 mmHg - - 84 16 96 % - -  10/16/15 1400 127/80 mmHg - - 106 17 98 % - -  10/16/15 1327 134/92 mmHg 98.4 F (36.9 C) Temporal 108 20 99 % 5\' 7"  (1.702 m) 168 lb (76.204 kg)    4:25 PM Reevaluation with update and discussion. After initial assessment and treatment, an updated evaluation reveals No further complaints. Findings discussed with patient in a male companion, all questions answered.  [place] Review Labs Reviewed - No data to display  Imaging Review No results found. I have personally reviewed and evaluated these images and lab results as part of my medical decision-making.   EKG Interpretation   Date/Time:  Tuesday Oct 16 2015 13:33:59 EDT Ventricular Rate:  105 PR Interval:  151 QRS Duration: 161 QT Interval:  375 QTC Calculation: 496 R Axis:   124 Text Interpretation:  Sinus tachycardia RBBB and LPFB Baseline wander in  lead(s) III aVF No old tracing to compare Confirmed by [facility]  MD, Creola Krotz  [phone]) on 10/16/2015 2:11:08 PM      MDM   Final diagnoses:  Cervical radiculopathy  Degenerative disc disease, cervical    Left neck and arm pain, likely related to cervical radiculopathy, with known degenerative disc disease and foraminal narrowing, cervical. Doubt spinal myelopathy, discitis, fracture.  Nursing Notes Reviewed/ Care Coordinated Applicable Imaging Reviewed Interpretation of Laboratory Data incorporated into ED treatment  The patient appears reasonably screened and/or stabilized for discharge and I doubt any other medical condition or other [facility] requiring further screening, evaluation, or treatment in the ED at this time prior to discharge.  Plan: Home Medications- Valium, Norco, prednisone; Home Treatments- heat treatments; return here if the recommended treatment, does not improve the symptoms; Recommended follow up- PCP when necessary with possible referral for advanced treatment with PT, spinal injection or surgery.    Daleen Bo, MD 10/16/15 1630

## 2015-10-16 NOTE — Discharge Instructions (Signed)
Use heat on the sore area 3 or 4 times a day.   Cervical Radiculopathy Cervical radiculopathy happens when a nerve in the neck (cervical nerve) is pinched or bruised. This condition can develop because of an injury or as part of the normal aging process. Pressure on the cervical nerves can cause pain or numbness that runs from the neck all the way down into the arm and fingers. Usually, this condition gets better with rest. Treatment may be needed if the condition does not improve.  CAUSES This condition may be caused by:  Injury.  Slipped (herniated) disk.  Muscle tightness in the neck because of overuse.  Arthritis.  Breakdown or degeneration in the bones and joints of the spine (spondylosis) due to aging.  Bone spurs that may develop near the cervical nerves. SYMPTOMS Symptoms of this condition include:  Pain that runs from the neck to the arm and hand. The pain can be severe or irritating. It may be worse when the neck is moved.  Numbness or weakness in the affected arm and hand. DIAGNOSIS This condition may be diagnosed based on symptoms, medical history, and a physical exam. You may also have tests, including:  X-rays.  CT scan.  MRI.  Electromyogram (EMG).  Nerve conduction tests. TREATMENT In many cases, treatment is not needed for this condition. With rest, the condition usually gets better over time. If treatment is needed, options may include:  Wearing a soft neck collar for short periods of time.  Physical therapy to strengthen your neck muscles.  Medicines, such as NSAIDs, oral corticosteroids, or spinal injections.  Surgery. This may be needed if other treatments do not help. Various types of surgery may be done depending on the cause of your problems. HOME CARE INSTRUCTIONS Managing Pain  Take over-the-counter and prescription medicines only as told by your health care provider.  If directed, apply ice to the affected area.  Put ice in a plastic  bag.  Place a towel between your skin and the bag.  Leave the ice on for 20 minutes, 2-3 times per day.  If ice does not help, you can try using heat. Take a warm shower or warm bath, or use a heat pack as told by your health care provider.  Try a gentle neck and shoulder massage to help relieve symptoms. Activity  Rest as needed. Follow instructions from your health care provider about any restrictions on activities.  Do stretching and strengthening exercises as told by your health care provider or physical therapist. General Instructions  If you were given a soft collar, wear it as told by your health care provider.  Use a flat pillow when you sleep.  Keep all follow-up visits as told by your health care provider. This is important. SEEK MEDICAL CARE IF:  Your condition does not improve with treatment. SEEK IMMEDIATE MEDICAL CARE IF:  Your pain gets much worse and cannot be controlled with medicines.  You have weakness or numbness in your hand, arm, face, or leg.  You have a high fever.  You have a stiff, rigid neck.  You lose control of your bowels or your bladder (have incontinence).  You have trouble with walking, balance, or speaking.   This information is not intended to replace advice given to you by your health care provider. Make sure you discuss any questions you have with your health care provider.   Document Released: 02/11/2001 Document Revised: 02/07/2015 Document Reviewed: 07/13/2014 Elsevier Interactive Patient Education [employer].  Degenerative Disk Disease Degenerative disk disease is a condition caused by the changes that occur in spinal disks as you grow older. Spinal disks are soft and compressible disks located between the bones of your spine (vertebrae). These disks act like shock absorbers. Degenerative disk disease can affect the whole spine. However, the neck and lower back are most commonly affected. Many changes can occur in the spinal  disks with aging, such as:  The spinal disks may dry and shrink.  Small tears may occur in the tough, outer covering of the disk (annulus).  The disk space may become smaller due to loss of water.  Abnormal growths in the bone (spurs) may occur. This can put pressure on the nerve roots exiting the spinal canal, causing pain.  The spinal canal may become narrowed. RISK FACTORS   Being overweight.  Having a family history of degenerative disk disease.  Smoking.  There is increased risk if you are doing heavy lifting or have a sudden injury. SIGNS AND SYMPTOMS  Symptoms vary from person to person and may include:  Pain that varies in intensity. Some people have no pain, while others have severe pain. The location of the pain depends on the part of your backbone that is affected.  You will have neck or arm pain if a disk in the neck area is affected.  You will have pain in your back, buttocks, or legs if a disk in the lower back is affected.  Pain that becomes worse while bending, reaching up, or with twisting movements.  Pain that may start gradually and then get worse as time passes. It may also start after a major or minor injury.  Numbness or tingling in the arms or legs. DIAGNOSIS  Your health care provider will ask you about your symptoms and about activities or habits that may cause the pain. He or she may also ask about any injuries, diseases, or treatments you have had. Your health care provider will examine you to check for the range of movement that is possible in the affected area, to check for strength in your extremities, and to check for sensation in the areas of the arms and legs supplied by different nerve roots. You may also have:   An X-ray of the spine.  Other imaging tests, such as MRI. TREATMENT  Your health care provider will advise you on the best plan for treatment. Treatment may include:  Medicines.  Rehabilitation exercises. HOME CARE INSTRUCTIONS     Follow proper lifting and walking techniques as advised by your health care provider.  Maintain good posture.  Exercise regularly as advised by your health care provider.  Perform relaxation exercises.  Change your sitting, standing, and sleeping habits as advised by your health care provider.  Change positions frequently.  Lose weight or maintain a healthy weight as advised by your health care provider.  Do not use any tobacco products, including cigarettes, chewing tobacco, or electronic cigarettes. If you need help quitting, ask your health care provider.  Wear supportive footwear.  Take medicines only as directed by your health care provider. SEEK MEDICAL CARE IF:   Your pain does not go away within 1-4 weeks.  You have significant appetite or weight loss. SEEK IMMEDIATE MEDICAL CARE IF:   Your pain is severe.  You notice weakness in your arms, hands, or legs.  You begin to lose control of your bladder or bowel movements.  You have fevers or night sweats. MAKE SURE YOU:  Understand these instructions.  Will watch your condition.  Will get help right away if you are not doing well or get worse.   This information is not intended to replace advice given to you by your health care provider. Make sure you discuss any questions you have with your health care provider.   Document Released: 03/16/2007 Document Revised: 06/09/2014 Document Reviewed: 09/20/2013 Elsevier Interactive Patient Education [employer].

## 2015-10-16 NOTE — ED Notes (Signed)
Pt reports pain in left side of neck radiating down left arm x 4 days.  Reports intermittent chest pain.  Pt says feels better when he lays down.  Reports HR has also been elevated recently.

## 2015-10-19 DIAGNOSIS — C61 Malignant neoplasm of prostate: Secondary | ICD-10-CM | POA: Diagnosis not present

## 2015-10-19 DIAGNOSIS — M5136 Other intervertebral disc degeneration, lumbar region: Secondary | ICD-10-CM | POA: Diagnosis not present

## 2015-10-19 DIAGNOSIS — E119 Type 2 diabetes mellitus without complications: Secondary | ICD-10-CM | POA: Diagnosis not present

## 2015-10-19 DIAGNOSIS — M109 Gout, unspecified: Secondary | ICD-10-CM | POA: Diagnosis not present

## 2015-10-19 DIAGNOSIS — T402X5A Adverse effect of other opioids, initial encounter: Secondary | ICD-10-CM | POA: Diagnosis not present

## 2015-10-19 DIAGNOSIS — K219 Gastro-esophageal reflux disease without esophagitis: Secondary | ICD-10-CM | POA: Diagnosis not present

## 2015-10-19 DIAGNOSIS — K589 Irritable bowel syndrome without diarrhea: Secondary | ICD-10-CM | POA: Diagnosis not present

## 2015-10-19 DIAGNOSIS — F5101 Primary insomnia: Secondary | ICD-10-CM | POA: Diagnosis not present

## 2015-10-19 DIAGNOSIS — E785 Hyperlipidemia, unspecified: Secondary | ICD-10-CM | POA: Diagnosis not present

## 2015-10-19 DIAGNOSIS — M5030 Other cervical disc degeneration, unspecified cervical region: Secondary | ICD-10-CM | POA: Diagnosis not present

## 2015-10-19 DIAGNOSIS — I10 Essential (primary) hypertension: Secondary | ICD-10-CM | POA: Diagnosis not present

## 2015-10-25 DIAGNOSIS — R000 Tachycardia, unspecified: Secondary | ICD-10-CM | POA: Diagnosis not present

## 2015-10-25 DIAGNOSIS — M9901 Segmental and somatic dysfunction of cervical region: Secondary | ICD-10-CM | POA: Diagnosis not present

## 2015-10-25 DIAGNOSIS — E785 Hyperlipidemia, unspecified: Secondary | ICD-10-CM | POA: Diagnosis not present

## 2015-10-25 DIAGNOSIS — I10 Essential (primary) hypertension: Secondary | ICD-10-CM | POA: Diagnosis not present

## 2015-10-25 DIAGNOSIS — C61 Malignant neoplasm of prostate: Secondary | ICD-10-CM | POA: Diagnosis not present

## 2015-10-25 DIAGNOSIS — M109 Gout, unspecified: Secondary | ICD-10-CM | POA: Diagnosis not present

## 2015-11-08 ENCOUNTER — Encounter (INDEPENDENT_AMBULATORY_CARE_PROVIDER_SITE_OTHER): Payer: Self-pay | Admitting: *Deleted

## 2015-11-15 ENCOUNTER — Encounter (HOSPITAL_COMMUNITY)
Admission: RE | Disposition: A | Discharge status: Home / Self Care | Payer: Self-pay | Source: Ambulatory Visit | Attending: Internal Medicine

## 2015-11-15 ENCOUNTER — Encounter (HOSPITAL_COMMUNITY): Payer: Self-pay | Admitting: *Deleted

## 2015-11-15 ENCOUNTER — Ambulatory Visit (HOSPITAL_COMMUNITY)
Admission: RE | Admit: 2015-11-15 | Discharge: 2015-11-15 | Disposition: A | Discharge status: Home / Self Care | Payer: PPO | Source: Ambulatory Visit | Attending: Internal Medicine | Admitting: Internal Medicine

## 2015-11-15 DIAGNOSIS — Z79899 Other long term (current) drug therapy: Secondary | ICD-10-CM | POA: Diagnosis not present

## 2015-11-15 DIAGNOSIS — Z7984 Long term (current) use of oral hypoglycemic drugs: Secondary | ICD-10-CM | POA: Diagnosis not present

## 2015-11-15 DIAGNOSIS — Z87891 Personal history of nicotine dependence: Secondary | ICD-10-CM | POA: Insufficient documentation

## 2015-11-15 DIAGNOSIS — Z86010 Personal history of colonic polyps: Secondary | ICD-10-CM | POA: Diagnosis not present

## 2015-11-15 DIAGNOSIS — K644 Residual hemorrhoidal skin tags: Secondary | ICD-10-CM | POA: Insufficient documentation

## 2015-11-15 DIAGNOSIS — Z800 Family history of malignant neoplasm of digestive organs: Secondary | ICD-10-CM | POA: Insufficient documentation

## 2015-11-15 DIAGNOSIS — Z09 Encounter for follow-up examination after completed treatment for conditions other than malignant neoplasm: Secondary | ICD-10-CM | POA: Diagnosis not present

## 2015-11-15 DIAGNOSIS — M1990 Unspecified osteoarthritis, unspecified site: Secondary | ICD-10-CM | POA: Diagnosis not present

## 2015-11-15 DIAGNOSIS — Z1211 Encounter for screening for malignant neoplasm of colon: Secondary | ICD-10-CM | POA: Diagnosis not present

## 2015-11-15 DIAGNOSIS — K5730 Diverticulosis of large intestine without perforation or abscess without bleeding: Secondary | ICD-10-CM | POA: Diagnosis not present

## 2015-11-15 DIAGNOSIS — E119 Type 2 diabetes mellitus without complications: Secondary | ICD-10-CM | POA: Diagnosis not present

## 2015-11-15 DIAGNOSIS — Z8546 Personal history of malignant neoplasm of prostate: Secondary | ICD-10-CM | POA: Insufficient documentation

## 2015-11-15 DIAGNOSIS — I10 Essential (primary) hypertension: Secondary | ICD-10-CM | POA: Insufficient documentation

## 2015-11-15 DIAGNOSIS — K633 Ulcer of intestine: Secondary | ICD-10-CM

## 2015-11-15 HISTORY — PX: COLONOSCOPY: SHX5424

## 2015-11-15 LAB — GLUCOSE, CAPILLARY: GLUCOSE-CAPILLARY: 115 mg/dL — AB (ref 65–99)

## 2015-11-15 SURGERY — COLONOSCOPY
Anesthesia: Moderate Sedation

## 2015-11-15 MED ORDER — MIDAZOLAM HCL 5 MG/5ML IJ SOLN
INTRAMUSCULAR | Status: DC | PRN
  Administered 2015-11-15: 2 mg via INTRAVENOUS
  Administered 2015-11-15: 3 mg via INTRAVENOUS

## 2015-11-15 MED ORDER — MEPERIDINE HCL 50 MG/ML IJ SOLN
INTRAMUSCULAR | Status: AC
  Filled 2015-11-15: qty 1

## 2015-11-15 MED ORDER — MIDAZOLAM HCL 5 MG/5ML IJ SOLN
INTRAMUSCULAR | Status: AC
  Filled 2015-11-15: qty 10

## 2015-11-15 MED ORDER — MEPERIDINE HCL 50 MG/ML IJ SOLN
INTRAMUSCULAR | Status: DC | PRN
  Administered 2015-11-15 (×2): 25 mg

## 2015-11-15 MED ORDER — SODIUM CHLORIDE 0.9 % IV SOLN
INTRAVENOUS | Status: DC
  Administered 2015-11-15: 1000 mL via INTRAVENOUS

## 2015-11-15 NOTE — Progress Notes (Signed)
Please excuse Ronald Faulkner from work on 11/15/2015 and 11/16/2015. He may return to work on 11/17/2015.  He may not drive, operate heavy machinery or sign legal documents for 24 hours.

## 2015-11-15 NOTE — Discharge Instructions (Signed)
Resume usual medications and high fiber diet. Keep ibuprofen use to minimum. No driving for 24 hours. Next colonoscopy in 5 years.       Colonoscopy, Care After These instructions give you information on caring for yourself after your procedure. Your doctor may also give you more specific instructions. Call your doctor if you have any problems or questions after your procedure. HOME CARE  Do not drive for 24 hours.  Do not sign important papers or use machinery for 24 hours.  You may shower.  You may go back to your usual activities, but go slower for the first 24 hours.  Take rest breaks often during the first 24 hours.  Walk around or use warm packs on your belly (abdomen) if you have belly cramping or gas.  Drink enough fluids to keep your pee (urine) clear or pale yellow.  Resume your normal diet. Avoid heavy or fried foods.  Avoid drinking alcohol for 24 hours or as told by your doctor.  Only take medicines as told by your doctor. If a tissue sample (biopsy) was taken during the procedure:   Do not take aspirin or blood thinners for 7 days, or as told by your doctor.  Do not drink alcohol for 7 days, or as told by your doctor.  Eat soft foods for the first 24 hours. GET HELP IF: You still have a small amount of blood in your poop (stool) 2-3 days after the procedure. GET HELP RIGHT AWAY IF:  You have more than a small amount of blood in your poop.  You see clumps of tissue (blood clots) in your poop.  Your belly is puffy (swollen).  You feel sick to your stomach (nauseous) or throw up (vomit).  You have a fever.  You have belly pain that gets worse and medicine does not help. MAKE SURE YOU:  Understand these instructions.  Will watch your condition.  Will get help right away if you are not doing well or get worse.   This information is not intended to replace advice given to you by your health care provider. Make sure you discuss any questions you  have with your health care provider.   Document Released: 06/21/2010 Document Revised: 05/24/2013 Document Reviewed: 01/24/2013 Elsevier Interactive Patient Education [employer].   Diverticulosis Diverticulosis is the condition that develops when small pouches (diverticula) form in the wall of your colon. Your colon, or large intestine, is where water is absorbed and stool is formed. The pouches form when the inside layer of your colon pushes through weak spots in the outer layers of your colon. CAUSES  No one knows exactly what causes diverticulosis. RISK FACTORS  Being older than 39. Your risk for this condition increases with age. Diverticulosis is rare in people younger than 40 years. By age 44, almost everyone has it.  Eating a low-fiber diet.  Being frequently constipated.  Being overweight.  Not getting enough exercise.  Smoking.  Taking over-the-counter pain medicines, like aspirin and ibuprofen. SYMPTOMS  Most people with diverticulosis do not have symptoms. DIAGNOSIS  Because diverticulosis often has no symptoms, health care providers often discover the condition during an exam for other colon problems. In many cases, a health care provider will diagnose diverticulosis while using a flexible scope to examine the colon (colonoscopy). TREATMENT  If you have never developed an infection related to diverticulosis, you may not need treatment. If you have had an infection before, treatment may include:  Eating more fruits, vegetables,  and grains.  Taking a fiber supplement.  Taking a live bacteria supplement (probiotic).  Taking medicine to relax your colon. HOME CARE INSTRUCTIONS   Drink at least 6-8 glasses of water each day to prevent constipation.  Try not to strain when you have a bowel movement.  Keep all follow-up appointments. If you have had an infection before:  Increase the fiber in your diet as directed by your health care provider or  dietitian.  Take a dietary fiber supplement if your health care provider approves.  Only take medicines as directed by your health care provider. SEEK MEDICAL CARE IF:   You have abdominal pain.  You have bloating.  You have cramps.  You have not gone to the bathroom in 3 days. SEEK IMMEDIATE MEDICAL CARE IF:   Your pain gets worse.  Yourbloating becomes very bad.  You have a fever or chills, and your symptoms suddenly get worse.  You begin vomiting.  You have bowel movements that are bloody or black. MAKE SURE YOU:  Understand these instructions.  Will watch your condition.  Will get help right away if you are not doing well or get worse.   This information is not intended to replace advice given to you by your health care provider. Make sure you discuss any questions you have with your health care provider.   Document Released: 02/14/2004 Document Revised: 05/24/2013 Document Reviewed: 04/13/2013 Elsevier Interactive Patient Education 2016 Elsevier Inc.   High-Fiber Diet Fiber, also called dietary fiber, is a type of carbohydrate found in fruits, vegetables, whole grains, and beans. A high-fiber diet can have many health benefits. Your health care provider may recommend a high-fiber diet to help:  Prevent constipation. Fiber can make your bowel movements more regular.  Lower your cholesterol.  Relieve hemorrhoids, uncomplicated diverticulosis, or irritable bowel syndrome.  Prevent overeating as part of a weight-loss plan.  Prevent heart disease, type 2 diabetes, and certain cancers. WHAT IS MY PLAN? The recommended daily intake of fiber includes:  38 grams for men under age 77.  79 grams for men over age 59.  42 grams for women under age 43.  68 grams for women over age 44. You can get the recommended daily intake of dietary fiber by eating a variety of fruits, vegetables, grains, and beans. Your health care provider may also recommend a fiber  supplement if it is not possible to get enough fiber through your diet. WHAT DO I NEED TO KNOW ABOUT A HIGH-FIBER DIET?  Fiber supplements have not been widely studied for their effectiveness, so it is better to get fiber through food sources.  Always check the fiber content on thenutrition facts label of any prepackaged food. Look for foods that contain at least 5 grams of fiber per serving.  Ask your dietitian if you have questions about specific foods that are related to your condition, especially if those foods are not listed in the following section.  Increase your daily fiber consumption gradually. Increasing your intake of dietary fiber too quickly may cause bloating, cramping, or gas.  Drink plenty of water. Water helps you to digest fiber. WHAT FOODS CAN I EAT? Grains Whole-grain breads. Multigrain cereal. Oats and oatmeal. Brown rice. Barley. Bulgur wheat. [place]. Bran muffins. Popcorn. Rye wafer crackers. Vegetables Sweet potatoes. Spinach. Kale. Artichokes. Cabbage. Broccoli. Green peas. Carrots. Squash. Fruits Berries. Pears. Apples. Oranges. Avocados. Prunes and raisins. Dried figs. Meats and Other Protein Sources Navy, kidney, pinto, and soy beans. Split peas. Lentils. Nuts  and seeds. Dairy Fiber-fortified yogurt. Beverages Fiber-fortified soy milk. Fiber-fortified orange juice. Other Fiber bars. The items listed above may not be a complete list of recommended foods or beverages. Contact your dietitian for more options. WHAT FOODS ARE NOT RECOMMENDED? Grains White bread. Pasta made with refined flour. White rice. Vegetables Fried potatoes. Canned vegetables. Well-cooked vegetables.  Fruits Fruit juice. Cooked, strained fruit. Meats and Other Protein Sources Fatty cuts of meat. Fried Sales executive or fried fish. Dairy Milk. Yogurt. Cream cheese. Sour cream. Beverages Soft drinks. Other Cakes and pastries. Butter and oils. The items listed above may not be a  complete list of foods and beverages to avoid. Contact your dietitian for more information. WHAT ARE SOME TIPS FOR INCLUDING HIGH-FIBER FOODS IN MY DIET?  Eat a wide variety of high-fiber foods.  Make sure that half of all grains consumed each day are whole grains.  Replace breads and cereals made from refined flour or white flour with whole-grain breads and cereals.  Replace white rice with brown rice, bulgur wheat, or millet.  Start the day with a breakfast that is high in fiber, such as a cereal that contains at least 5 grams of fiber per serving.  Use beans in place of meat in soups, salads, or pasta.  Eat high-fiber snacks, such as berries, raw vegetables, nuts, or popcorn.   This information is not intended to replace advice given to you by your health care provider. Make sure you discuss any questions you have with your health care provider.   Document Released: 05/19/2005 Document Revised: 06/09/2014 Document Reviewed: 11/01/2013 Elsevier Interactive Patient Education [employer].

## 2015-11-15 NOTE — H&P (Signed)
Ronald Faulkner is an 66 y.o. male.   Chief Complaint: Patient is here for colonoscopy. HPI: She is 65 year old African-American male who has history of tonic adenomas and family history of CRC and is here for surveillance colonoscopy. He has chronic constipation. He is on low-dose Linzess which is helping. He denies rectal bleeding. He did not have colonic polyps on his last exam which was in October 2012. GI history is also significant for a sigmoid colon resection in 2007 4 diverticulitis. History significant for CRC in mother was 61 at the time of diagnosis and died within 3 months due to complications of chemotherapy.   Past Medical History  Diagnosis Date  . Hypertension   . Arthritis   . Colon polyps   . Diverticulitis   . IBS (irritable bowel syndrome)   . Cancer ([place])   . Diabetes mellitus without complication [facility])     Past Surgical History  Procedure Laterality Date  . Appendectomy    . Colon surgery    . Breast surgery      benign lump at lt side  . Colonoscopy  03/14/2011    Procedure: COLONOSCOPY;  Surgeon: [name], MD;  Location: [facility];  Service: Endoscopy;  Laterality: N/A;  1:00  . Abdominal surgery    . Prostate cancer      2015. prostectomy  . Penile prosthesis implant      2016     Family History  Problem Relation Age of Onset  . Colon cancer Mother   . Anesthesia problems Neg Hx   . Hypotension Neg Hx   . Malignant hyperthermia Neg Hx   . Pseudochol deficiency Neg Hx   . Coronary artery disease Maternal Grandmother    Social History:  reports that he quit smoking about 16 years ago. His smoking use included Cigarettes. He has a 35 pack-year smoking history. He has never used smokeless tobacco. He reports that he drinks alcohol. He reports that he does not use illicit drugs.  Allergies:  Allergies  Allergen Reactions  . Bee Venom   . Fish Allergy   . Ivp Dye [Iodinated Diagnostic Agents] Swelling    Medications Prior to Admission   Medication Sig Dispense Refill  . allopurinol (ZYLOPRIM) 100 MG tablet Take 300 mg by mouth 2 (two) times daily.     . diazepam (VALIUM) 10 MG tablet Take 1 tablet (10 mg total) by mouth every 6 (six) hours as needed (Muscle spasm). 20 tablet 0  . gabapentin (NEURONTIN) 100 MG capsule Take 300 mg by mouth 2 (two) times daily.     Marland Kitchen HYDROcodone-acetaminophen (NORCO) 5-325 MG tablet Take 1 tablet by mouth every 4 (four) hours as needed. 20 tablet 0  . linaclotide (LINZESS) 145 MCG CAPS capsule Take 145 mcg by mouth daily before breakfast.    . metFORMIN (GLUCOPHAGE) 500 MG tablet Take 1 tablet by mouth 2 (two) times daily with a meal.     . Na Sulfate-K Sulfate-Mg Sulf (SUPREP BOWEL PREP KIT) 17.5-3.13-1.6 GM/180ML SOLN Take by mouth.    Marland Kitchen oxymetazoline (12 HOUR NASAL SPRAY) 0.05 % nasal spray Place 1 spray into both nostrils 2 (two) times daily.     . valsartan-hydrochlorothiazide (DIOVAN-HCT) 160-12.5 MG per tablet Take 1 tablet by mouth daily.     Marland Kitchen ibuprofen (ADVIL,MOTRIN) 200 MG tablet Take 200 mg by mouth every 6 (six) hours as needed for moderate pain.     Marland Kitchen loratadine (CLARITIN) 10 MG tablet Take 10  mg by mouth daily as needed for allergies.    . polyethylene glycol-electrolytes (NULYTELY/GOLYTELY) 420 g solution Take 4,000 mLs by mouth once. 4000 mL 0  . predniSONE (DELTASONE) 20 MG tablet Take 1 tablet (20 mg total) by mouth 2 (two) times daily. (Patient not taking: Reported on 11/08/2015) 10 tablet 0    Results for orders placed or performed during the hospital encounter of 11/15/15 (from the past 48 hour(s))  Glucose, capillary     Status: Abnormal   Collection Time: 11/15/15 10:59 AM  Result Value Ref Range   Glucose-Capillary 115 (H) 65 - 99 mg/dL   No results found.  ROS  Blood pressure 130/91, pulse 103, temperature 98.3 F (36.8 C), temperature source Oral, resp. rate 14, height '5\' 7"'  (1.702 m), weight 164 lb (74.39 kg), SpO2 100 %. Physical Exam  Constitutional: He  appears well-developed and well-nourished.  HENT:  Mouth/Throat: Oropharynx is clear and moist.  Eyes: Conjunctivae are normal. No scleral icterus.  Neck: No thyromegaly present.  Cardiovascular: Normal rate, regular rhythm and normal heart sounds.   No murmur heard. Respiratory: Effort normal and breath sounds normal.  GI:  Full abdomen with upper midline scar. Small hernia middle of the scar. Abdomen soft and nontender without organomegaly or masses.  Musculoskeletal: He exhibits no edema.  Lymphadenopathy:    He has no cervical adenopathy.  Neurological: He is alert.  Skin: Skin is warm and dry.     Assessment/Plan History of colonic polyps. Family history of CRC in mother.  Hildred Laser, MD 11/15/2015, 11:38 AM

## 2015-11-15 NOTE — Op Note (Signed)
[facility] Patient Name: Ronald Faulkner Procedure Date: 11/15/2015 11:32 AM MRN: [id number] Date of Birth: 1951/01/06 Attending MD: Hildred Laser , MD CSN: [id number] Age: 65 Admit Type: Outpatient Procedure:                Colonoscopy Indications:              High risk colon cancer surveillance: Personal                            history of colonic polyps, Family history of colon                            cancer in a first-degree relative Providers:                Hildred Laser, MD, Janeece Riggers, RN, Bonnetta Barry,                            Technician Referring MD:             [name], DO Medicines:                Meperidine 50 mg IV, Midazolam 5 mg IV Complications:            No immediate complications. Estimated Blood Loss:     Estimated blood loss: none. Procedure:                Pre-Anesthesia Assessment:                           - Prior to the procedure, a History and Physical                            was performed, and patient medications and                            allergies were reviewed. The patient's tolerance of                            previous anesthesia was also reviewed. The risks                            and benefits of the procedure and the sedation                            options and risks were discussed with the patient.                            All questions were answered, and informed consent                            was obtained. Prior Anticoagulants: The patient                            last took ibuprofen 14 days prior to the procedure.  ASA Grade Assessment: III - A patient with severe                            systemic disease. After reviewing the risks and                            benefits, the patient was deemed in satisfactory                            condition to undergo the procedure.                           After obtaining informed consent, the colonoscope                            was  passed under direct vision. Throughout the                            procedure, the patient's blood pressure, pulse, and                            oxygen saturations were monitored continuously. The                            was introduced through the anus and advanced to the                            the terminal ileum. The colonoscopy was performed                            without difficulty. The patient tolerated the                            procedure well. The quality of the bowel                            preparation was excellent. The terminal ileum,                            ileocecal valve, appendiceal orifice, and rectum                            were photographed. Scope In: 11:51:32 AM Scope Out: 12:05:42 PM Scope Withdrawal Time: 0 hours 11 minutes 59 seconds  Total Procedure Duration: 0 hours 14 minutes 10 seconds  Findings:      The terminal ileum appeared normal.      A single non-bleeding erosion was found in the cecum. No stigmata of       recent bleeding were seen. single erosion most like secondary to NSAID       use.      Scattered small and large-mouthed diverticula were found in the       descending colon, transverse colon and ascending colon.      External hemorrhoids were found during retroflexion. The hemorrhoids  were small. Impression:               - The examined portion of the ileum was normal.                           - A single erosion in the cecum most likely                            secondary to NSAID use.                           - Diverticulosis in the descending colon, in the                            transverse colon and in the ascending colon.                           - External hemorrhoids.                           - No specimens collected. Moderate Sedation:      Moderate (conscious) sedation was administered by the endoscopy nurse       and supervised by the endoscopist. The following parameters were       monitored:  oxygen saturation, heart rate, blood pressure, CO2       capnography and response to care. Total physician intraservice time was       20 minutes. Recommendation:           - Patient has a contact number available for                            emergencies. The signs and symptoms of potential                            delayed complications were discussed with the                            patient. Return to normal activities tomorrow.                            Written discharge instructions were provided to the                            patient.                           - High fiber diet today.                           - Continue present medications.                           - Repeat colonoscopy in 5 years for surveillance. Procedure Code(s):        --- Professional ---  [phone], Colonoscopy, flexible; diagnostic, including                            collection of specimen(s) by brushing or washing,                            when performed (separate procedure)                           99152, Moderate sedation services provided by the                            same physician or other qualified health care                            professional performing the diagnostic or                            therapeutic service that the sedation supports,                            requiring the presence of an independent trained                            observer to assist in the monitoring of the                            patient's level of consciousness and physiological                            status; initial 15 minutes of intraservice time,                            patient age 59 years or older Diagnosis Code(s):        --- Professional ---                           Z86.010, Personal history of colonic polyps                           K64.4, Residual hemorrhoidal skin tags                           K63.3, Ulcer of intestine                            Z80.0, Family history of malignant neoplasm of                            digestive organs                           K57.30, Diverticulosis of large intestine without                            perforation or  abscess without bleeding CPT copyright 2016 [employer]. All rights reserved. The codes documented in this report are preliminary and upon coder review may  be revised to meet current compliance requirements. Hildred Laser, MD Hildred Laser, MD 11/15/2015 12:19:27 PM This report has been signed electronically. Number of Addenda: 0

## 2015-11-16 ENCOUNTER — Encounter (HOSPITAL_COMMUNITY): Payer: Self-pay | Admitting: Internal Medicine

## 2015-11-28 DIAGNOSIS — C61 Malignant neoplasm of prostate: Secondary | ICD-10-CM | POA: Diagnosis not present

## 2015-12-06 DIAGNOSIS — N393 Stress incontinence (female) (male): Secondary | ICD-10-CM | POA: Diagnosis not present

## 2015-12-06 DIAGNOSIS — N528 Other male erectile dysfunction: Secondary | ICD-10-CM | POA: Diagnosis not present

## 2015-12-06 DIAGNOSIS — C61 Malignant neoplasm of prostate: Secondary | ICD-10-CM | POA: Diagnosis not present

## 2015-12-17 DIAGNOSIS — C61 Malignant neoplasm of prostate: Secondary | ICD-10-CM | POA: Diagnosis not present

## 2015-12-17 DIAGNOSIS — M109 Gout, unspecified: Secondary | ICD-10-CM | POA: Diagnosis not present

## 2015-12-17 DIAGNOSIS — E785 Hyperlipidemia, unspecified: Secondary | ICD-10-CM | POA: Diagnosis not present

## 2015-12-17 DIAGNOSIS — I10 Essential (primary) hypertension: Secondary | ICD-10-CM | POA: Diagnosis not present

## 2015-12-28 DIAGNOSIS — M1A09X0 Idiopathic chronic gout, multiple sites, without tophus (tophi): Secondary | ICD-10-CM | POA: Diagnosis not present

## 2015-12-28 DIAGNOSIS — M2550 Pain in unspecified joint: Secondary | ICD-10-CM | POA: Diagnosis not present

## 2015-12-28 DIAGNOSIS — G609 Hereditary and idiopathic neuropathy, unspecified: Secondary | ICD-10-CM | POA: Diagnosis not present

## 2015-12-28 DIAGNOSIS — M5136 Other intervertebral disc degeneration, lumbar region: Secondary | ICD-10-CM | POA: Diagnosis not present

## 2015-12-28 DIAGNOSIS — G894 Chronic pain syndrome: Secondary | ICD-10-CM | POA: Diagnosis not present

## 2015-12-28 DIAGNOSIS — K210 Gastro-esophageal reflux disease with esophagitis: Secondary | ICD-10-CM | POA: Diagnosis not present

## 2016-01-07 DIAGNOSIS — S098XXA Other specified injuries of head, initial encounter: Secondary | ICD-10-CM | POA: Diagnosis not present

## 2016-01-07 DIAGNOSIS — G4489 Other headache syndrome: Secondary | ICD-10-CM | POA: Diagnosis not present

## 2016-01-09 DIAGNOSIS — S161XXD Strain of muscle, fascia and tendon at neck level, subsequent encounter: Secondary | ICD-10-CM | POA: Diagnosis not present

## 2016-03-11 DIAGNOSIS — C61 Malignant neoplasm of prostate: Secondary | ICD-10-CM | POA: Diagnosis not present

## 2016-03-25 DIAGNOSIS — C61 Malignant neoplasm of prostate: Secondary | ICD-10-CM | POA: Diagnosis not present

## 2016-03-31 DIAGNOSIS — M2550 Pain in unspecified joint: Secondary | ICD-10-CM | POA: Diagnosis not present

## 2016-03-31 DIAGNOSIS — G894 Chronic pain syndrome: Secondary | ICD-10-CM | POA: Diagnosis not present

## 2016-03-31 DIAGNOSIS — M1A09X0 Idiopathic chronic gout, multiple sites, without tophus (tophi): Secondary | ICD-10-CM | POA: Diagnosis not present

## 2016-03-31 DIAGNOSIS — G609 Hereditary and idiopathic neuropathy, unspecified: Secondary | ICD-10-CM | POA: Diagnosis not present

## 2016-03-31 DIAGNOSIS — K210 Gastro-esophageal reflux disease with esophagitis: Secondary | ICD-10-CM | POA: Diagnosis not present

## 2016-03-31 DIAGNOSIS — M5136 Other intervertebral disc degeneration, lumbar region: Secondary | ICD-10-CM | POA: Diagnosis not present

## 2016-05-06 DIAGNOSIS — F5101 Primary insomnia: Secondary | ICD-10-CM | POA: Diagnosis not present

## 2016-05-06 DIAGNOSIS — G629 Polyneuropathy, unspecified: Secondary | ICD-10-CM | POA: Diagnosis not present

## 2016-05-06 DIAGNOSIS — K5901 Slow transit constipation: Secondary | ICD-10-CM | POA: Diagnosis not present

## 2016-05-06 DIAGNOSIS — M5136 Other intervertebral disc degeneration, lumbar region: Secondary | ICD-10-CM | POA: Diagnosis not present

## 2016-05-06 DIAGNOSIS — M797 Fibromyalgia: Secondary | ICD-10-CM | POA: Diagnosis not present

## 2016-05-06 DIAGNOSIS — M109 Gout, unspecified: Secondary | ICD-10-CM | POA: Diagnosis not present

## 2016-05-06 DIAGNOSIS — E785 Hyperlipidemia, unspecified: Secondary | ICD-10-CM | POA: Diagnosis not present

## 2016-05-06 DIAGNOSIS — K219 Gastro-esophageal reflux disease without esophagitis: Secondary | ICD-10-CM | POA: Diagnosis not present

## 2016-05-06 DIAGNOSIS — K589 Irritable bowel syndrome without diarrhea: Secondary | ICD-10-CM | POA: Diagnosis not present

## 2016-05-06 DIAGNOSIS — M5030 Other cervical disc degeneration, unspecified cervical region: Secondary | ICD-10-CM | POA: Diagnosis not present

## 2016-05-06 DIAGNOSIS — I10 Essential (primary) hypertension: Secondary | ICD-10-CM | POA: Diagnosis not present

## 2016-06-06 DIAGNOSIS — J40 Bronchitis, not specified as acute or chronic: Secondary | ICD-10-CM | POA: Diagnosis not present

## 2016-06-06 DIAGNOSIS — J069 Acute upper respiratory infection, unspecified: Secondary | ICD-10-CM | POA: Diagnosis not present

## 2016-07-24 DIAGNOSIS — E119 Type 2 diabetes mellitus without complications: Secondary | ICD-10-CM | POA: Diagnosis not present

## 2016-07-24 DIAGNOSIS — Z79899 Other long term (current) drug therapy: Secondary | ICD-10-CM | POA: Diagnosis not present

## 2016-07-24 DIAGNOSIS — M109 Gout, unspecified: Secondary | ICD-10-CM | POA: Diagnosis not present

## 2016-07-24 DIAGNOSIS — R5382 Chronic fatigue, unspecified: Secondary | ICD-10-CM | POA: Diagnosis not present

## 2016-08-26 DIAGNOSIS — E663 Overweight: Secondary | ICD-10-CM | POA: Diagnosis not present

## 2016-08-26 DIAGNOSIS — G609 Hereditary and idiopathic neuropathy, unspecified: Secondary | ICD-10-CM | POA: Diagnosis not present

## 2016-08-26 DIAGNOSIS — Z79899 Other long term (current) drug therapy: Secondary | ICD-10-CM | POA: Diagnosis not present

## 2016-08-26 DIAGNOSIS — M1A09X0 Idiopathic chronic gout, multiple sites, without tophus (tophi): Secondary | ICD-10-CM | POA: Diagnosis not present

## 2016-08-26 DIAGNOSIS — M2550 Pain in unspecified joint: Secondary | ICD-10-CM | POA: Diagnosis not present

## 2016-08-26 DIAGNOSIS — Z6826 Body mass index (BMI) 26.0-26.9, adult: Secondary | ICD-10-CM | POA: Diagnosis not present

## 2016-08-26 DIAGNOSIS — G894 Chronic pain syndrome: Secondary | ICD-10-CM | POA: Diagnosis not present

## 2016-09-03 DIAGNOSIS — K219 Gastro-esophageal reflux disease without esophagitis: Secondary | ICD-10-CM | POA: Diagnosis not present

## 2016-09-03 DIAGNOSIS — I10 Essential (primary) hypertension: Secondary | ICD-10-CM | POA: Diagnosis not present

## 2016-09-03 DIAGNOSIS — C61 Malignant neoplasm of prostate: Secondary | ICD-10-CM | POA: Diagnosis not present

## 2016-09-03 DIAGNOSIS — M5136 Other intervertebral disc degeneration, lumbar region: Secondary | ICD-10-CM | POA: Diagnosis not present

## 2016-09-03 DIAGNOSIS — Z6826 Body mass index (BMI) 26.0-26.9, adult: Secondary | ICD-10-CM | POA: Diagnosis not present

## 2016-09-03 DIAGNOSIS — K5901 Slow transit constipation: Secondary | ICD-10-CM | POA: Diagnosis not present

## 2016-09-03 DIAGNOSIS — Z7984 Long term (current) use of oral hypoglycemic drugs: Secondary | ICD-10-CM | POA: Diagnosis not present

## 2016-09-03 DIAGNOSIS — M1A0790 Idiopathic chronic gout, unspecified ankle and foot, without tophus (tophi): Secondary | ICD-10-CM | POA: Diagnosis not present

## 2016-09-03 DIAGNOSIS — E109 Type 1 diabetes mellitus without complications: Secondary | ICD-10-CM | POA: Diagnosis not present

## 2016-09-03 DIAGNOSIS — E119 Type 2 diabetes mellitus without complications: Secondary | ICD-10-CM | POA: Diagnosis not present

## 2016-09-18 DIAGNOSIS — N5231 Erectile dysfunction following radical prostatectomy: Secondary | ICD-10-CM | POA: Diagnosis not present

## 2016-09-18 DIAGNOSIS — N393 Stress incontinence (female) (male): Secondary | ICD-10-CM | POA: Diagnosis not present

## 2016-09-18 DIAGNOSIS — C61 Malignant neoplasm of prostate: Secondary | ICD-10-CM | POA: Diagnosis not present

## 2016-12-11 DIAGNOSIS — K219 Gastro-esophageal reflux disease without esophagitis: Secondary | ICD-10-CM | POA: Diagnosis not present

## 2016-12-11 DIAGNOSIS — Z7984 Long term (current) use of oral hypoglycemic drugs: Secondary | ICD-10-CM | POA: Diagnosis not present

## 2016-12-11 DIAGNOSIS — E782 Mixed hyperlipidemia: Secondary | ICD-10-CM | POA: Diagnosis not present

## 2016-12-11 DIAGNOSIS — E119 Type 2 diabetes mellitus without complications: Secondary | ICD-10-CM | POA: Diagnosis not present

## 2016-12-16 DIAGNOSIS — K219 Gastro-esophageal reflux disease without esophagitis: Secondary | ICD-10-CM | POA: Diagnosis not present

## 2016-12-16 DIAGNOSIS — Z7984 Long term (current) use of oral hypoglycemic drugs: Secondary | ICD-10-CM | POA: Diagnosis not present

## 2016-12-16 DIAGNOSIS — Z6826 Body mass index (BMI) 26.0-26.9, adult: Secondary | ICD-10-CM | POA: Diagnosis not present

## 2016-12-16 DIAGNOSIS — E119 Type 2 diabetes mellitus without complications: Secondary | ICD-10-CM | POA: Diagnosis not present

## 2016-12-16 DIAGNOSIS — M5136 Other intervertebral disc degeneration, lumbar region: Secondary | ICD-10-CM | POA: Diagnosis not present

## 2016-12-16 DIAGNOSIS — C61 Malignant neoplasm of prostate: Secondary | ICD-10-CM | POA: Diagnosis not present

## 2016-12-16 DIAGNOSIS — I10 Essential (primary) hypertension: Secondary | ICD-10-CM | POA: Diagnosis not present

## 2016-12-16 DIAGNOSIS — E109 Type 1 diabetes mellitus without complications: Secondary | ICD-10-CM | POA: Diagnosis not present

## 2016-12-16 DIAGNOSIS — M1A0790 Idiopathic chronic gout, unspecified ankle and foot, without tophus (tophi): Secondary | ICD-10-CM | POA: Diagnosis not present

## 2016-12-16 DIAGNOSIS — E782 Mixed hyperlipidemia: Secondary | ICD-10-CM | POA: Diagnosis not present

## 2016-12-16 DIAGNOSIS — K5901 Slow transit constipation: Secondary | ICD-10-CM | POA: Diagnosis not present

## 2016-12-16 DIAGNOSIS — J301 Allergic rhinitis due to pollen: Secondary | ICD-10-CM | POA: Diagnosis not present

## 2017-02-26 DIAGNOSIS — G609 Hereditary and idiopathic neuropathy, unspecified: Secondary | ICD-10-CM | POA: Diagnosis not present

## 2017-02-26 DIAGNOSIS — M2550 Pain in unspecified joint: Secondary | ICD-10-CM | POA: Diagnosis not present

## 2017-02-26 DIAGNOSIS — M1A09X0 Idiopathic chronic gout, multiple sites, without tophus (tophi): Secondary | ICD-10-CM | POA: Diagnosis not present

## 2017-02-26 DIAGNOSIS — Z79899 Other long term (current) drug therapy: Secondary | ICD-10-CM | POA: Diagnosis not present

## 2017-02-26 DIAGNOSIS — G894 Chronic pain syndrome: Secondary | ICD-10-CM | POA: Diagnosis not present

## 2017-02-26 DIAGNOSIS — Z6824 Body mass index (BMI) 24.0-24.9, adult: Secondary | ICD-10-CM | POA: Diagnosis not present

## 2017-02-26 DIAGNOSIS — E663 Overweight: Secondary | ICD-10-CM | POA: Diagnosis not present

## 2017-03-18 DIAGNOSIS — Z7984 Long term (current) use of oral hypoglycemic drugs: Secondary | ICD-10-CM | POA: Diagnosis not present

## 2017-03-18 DIAGNOSIS — I10 Essential (primary) hypertension: Secondary | ICD-10-CM | POA: Diagnosis not present

## 2017-03-18 DIAGNOSIS — M5136 Other intervertebral disc degeneration, lumbar region: Secondary | ICD-10-CM | POA: Diagnosis not present

## 2017-03-18 DIAGNOSIS — E782 Mixed hyperlipidemia: Secondary | ICD-10-CM | POA: Diagnosis not present

## 2017-03-18 DIAGNOSIS — E109 Type 1 diabetes mellitus without complications: Secondary | ICD-10-CM | POA: Diagnosis not present

## 2017-03-18 DIAGNOSIS — K219 Gastro-esophageal reflux disease without esophagitis: Secondary | ICD-10-CM | POA: Diagnosis not present

## 2017-03-18 DIAGNOSIS — M1A0790 Idiopathic chronic gout, unspecified ankle and foot, without tophus (tophi): Secondary | ICD-10-CM | POA: Diagnosis not present

## 2017-03-18 DIAGNOSIS — K5901 Slow transit constipation: Secondary | ICD-10-CM | POA: Diagnosis not present

## 2017-03-18 DIAGNOSIS — C61 Malignant neoplasm of prostate: Secondary | ICD-10-CM | POA: Diagnosis not present

## 2017-03-18 DIAGNOSIS — J301 Allergic rhinitis due to pollen: Secondary | ICD-10-CM | POA: Diagnosis not present

## 2017-03-18 DIAGNOSIS — Z6826 Body mass index (BMI) 26.0-26.9, adult: Secondary | ICD-10-CM | POA: Diagnosis not present

## 2017-03-18 DIAGNOSIS — E119 Type 2 diabetes mellitus without complications: Secondary | ICD-10-CM | POA: Diagnosis not present

## 2017-03-23 DIAGNOSIS — Z8546 Personal history of malignant neoplasm of prostate: Secondary | ICD-10-CM | POA: Diagnosis not present

## 2017-04-09 DIAGNOSIS — Z8546 Personal history of malignant neoplasm of prostate: Secondary | ICD-10-CM | POA: Diagnosis not present

## 2017-04-09 DIAGNOSIS — E119 Type 2 diabetes mellitus without complications: Secondary | ICD-10-CM | POA: Diagnosis not present

## 2017-04-09 DIAGNOSIS — N5231 Erectile dysfunction following radical prostatectomy: Secondary | ICD-10-CM | POA: Diagnosis not present

## 2017-04-09 DIAGNOSIS — N393 Stress incontinence (female) (male): Secondary | ICD-10-CM | POA: Diagnosis not present

## 2017-05-26 ENCOUNTER — Emergency Department (HOSPITAL_COMMUNITY): Payer: PPO

## 2017-05-26 ENCOUNTER — Observation Stay (HOSPITAL_COMMUNITY)
Admission: EM | Admit: 2017-05-26 | Discharge: 2017-05-27 | Disposition: A | Discharge status: Home / Self Care | Payer: PPO | Attending: Internal Medicine | Admitting: Internal Medicine

## 2017-05-26 ENCOUNTER — Encounter (HOSPITAL_COMMUNITY): Payer: Self-pay

## 2017-05-26 ENCOUNTER — Other Ambulatory Visit: Payer: Self-pay

## 2017-05-26 DIAGNOSIS — Z992 Dependence on renal dialysis: Secondary | ICD-10-CM

## 2017-05-26 DIAGNOSIS — Z23 Encounter for immunization: Secondary | ICD-10-CM | POA: Insufficient documentation

## 2017-05-26 DIAGNOSIS — N186 End stage renal disease: Secondary | ICD-10-CM

## 2017-05-26 DIAGNOSIS — I10 Essential (primary) hypertension: Secondary | ICD-10-CM | POA: Diagnosis not present

## 2017-05-26 DIAGNOSIS — Z79899 Other long term (current) drug therapy: Secondary | ICD-10-CM | POA: Insufficient documentation

## 2017-05-26 DIAGNOSIS — Z87891 Personal history of nicotine dependence: Secondary | ICD-10-CM | POA: Diagnosis not present

## 2017-05-26 DIAGNOSIS — R0602 Shortness of breath: Secondary | ICD-10-CM | POA: Diagnosis not present

## 2017-05-26 DIAGNOSIS — R079 Chest pain, unspecified: Secondary | ICD-10-CM | POA: Diagnosis not present

## 2017-05-26 DIAGNOSIS — Z7984 Long term (current) use of oral hypoglycemic drugs: Secondary | ICD-10-CM | POA: Diagnosis not present

## 2017-05-26 DIAGNOSIS — M109 Gout, unspecified: Secondary | ICD-10-CM | POA: Diagnosis present

## 2017-05-26 DIAGNOSIS — R600 Localized edema: Secondary | ICD-10-CM | POA: Diagnosis not present

## 2017-05-26 DIAGNOSIS — R0789 Other chest pain: Principal | ICD-10-CM

## 2017-05-26 DIAGNOSIS — R0609 Other forms of dyspnea: Secondary | ICD-10-CM

## 2017-05-26 DIAGNOSIS — E119 Type 2 diabetes mellitus without complications: Secondary | ICD-10-CM | POA: Insufficient documentation

## 2017-05-26 DIAGNOSIS — E1122 Type 2 diabetes mellitus with diabetic chronic kidney disease: Secondary | ICD-10-CM

## 2017-05-26 LAB — I-STAT TROPONIN, ED
Troponin i, poc: 0 ng/mL (ref 0.00–0.08)
Troponin i, poc: 0 ng/mL (ref 0.00–0.08)

## 2017-05-26 LAB — CBC
HEMATOCRIT: 38 % — AB (ref 39.0–52.0)
Hemoglobin: 12.5 g/dL — ABNORMAL LOW (ref 13.0–17.0)
MCH: 29.1 pg (ref 26.0–34.0)
MCHC: 32.9 g/dL (ref 30.0–36.0)
MCV: 88.6 fL (ref 78.0–100.0)
Platelets: 256 10*3/uL (ref 150–400)
RBC: 4.29 MIL/uL (ref 4.22–5.81)
RDW: 15.4 % (ref 11.5–15.5)
WBC: 6.1 10*3/uL (ref 4.0–10.5)

## 2017-05-26 LAB — BASIC METABOLIC PANEL
Anion gap: 10 (ref 5–15)
BUN: 11 mg/dL (ref 6–20)
CHLORIDE: 108 mmol/L (ref 101–111)
CO2: 24 mmol/L (ref 22–32)
Calcium: 9.1 mg/dL (ref 8.9–10.3)
Creatinine, Ser: 0.97 mg/dL (ref 0.61–1.24)
GFR calc Af Amer: >60 mL/min (ref >60)
GFR calc non Af Amer: >60 mL/min (ref >60)
GLUCOSE: 103 mg/dL — AB (ref 65–99)
POTASSIUM: 3.5 mmol/L (ref 3.5–5.1)
Sodium: 142 mmol/L (ref 135–145)

## 2017-05-26 LAB — GLUCOSE, CAPILLARY: Glucose-Capillary: 78 mg/dL (ref 65–99)

## 2017-05-26 LAB — BRAIN NATRIURETIC PEPTIDE: B Natriuretic Peptide: 64.4 pg/mL (ref 0.0–100.0)

## 2017-05-26 LAB — TROPONIN I: Troponin I: <0.03 ng/mL (ref <0.03)

## 2017-05-26 MED ORDER — SODIUM CHLORIDE 0.9% FLUSH: 3.0000 mL | INTRAVENOUS | Status: DC | PRN

## 2017-05-26 MED ORDER — ACETAMINOPHEN 325 MG PO TABS: 650.0000 mg | ORAL_TABLET | Freq: Four times a day (QID) | ORAL | Status: DC | PRN

## 2017-05-26 MED ORDER — HYDROCHLOROTHIAZIDE 12.5 MG PO CAPS
12.5000 mg | ORAL_CAPSULE | Freq: Every day | ORAL | Status: DC
  Administered 2017-05-26 – 2017-05-27 (×2): 12.5 mg via ORAL
  Filled 2017-05-26 (×2): qty 1

## 2017-05-26 MED ORDER — ALLOPURINOL 300 MG PO TABS
300.0000 mg | ORAL_TABLET | Freq: Two times a day (BID) | ORAL | Status: DC
  Administered 2017-05-26 – 2017-05-27 (×2): 300 mg via ORAL
  Filled 2017-05-26 (×2): qty 1

## 2017-05-26 MED ORDER — INSULIN ASPART 100 UNIT/ML ~~LOC~~ SOLN: 0.0000 [IU] | Freq: Three times a day (TID) | SUBCUTANEOUS | Status: DC

## 2017-05-26 MED ORDER — LINACLOTIDE 145 MCG PO CAPS
145.0000 ug | ORAL_CAPSULE | Freq: Every day | ORAL | Status: DC
  Filled 2017-05-26: qty 1

## 2017-05-26 MED ORDER — ONDANSETRON HCL 4 MG/2ML IJ SOLN: 4.0000 mg | Freq: Four times a day (QID) | INTRAMUSCULAR | Status: DC | PRN

## 2017-05-26 MED ORDER — LORATADINE 10 MG PO TABS
10.0000 mg | ORAL_TABLET | Freq: Every day | ORAL | Status: DC | PRN
  Administered 2017-05-26: 10 mg via ORAL
  Filled 2017-05-26: qty 1

## 2017-05-26 MED ORDER — SODIUM CHLORIDE 0.9% FLUSH
3.0000 mL | Freq: Two times a day (BID) | INTRAVENOUS | Status: DC
  Administered 2017-05-26 – 2017-05-27 (×2): 3 mL via INTRAVENOUS

## 2017-05-26 MED ORDER — INFLUENZA VAC SPLIT HIGH-DOSE 0.5 ML IM SUSY
0.5000 mL | PREFILLED_SYRINGE | INTRAMUSCULAR | Status: AC
  Administered 2017-05-27: 0.5 mL via INTRAMUSCULAR
  Filled 2017-05-26: qty 0.5

## 2017-05-26 MED ORDER — ZOLPIDEM TARTRATE 5 MG PO TABS: 5.0000 mg | ORAL_TABLET | Freq: Every evening | ORAL | Status: DC | PRN

## 2017-05-26 MED ORDER — HYDROCODONE-ACETAMINOPHEN 5-325 MG PO TABS: 1.0000 | ORAL_TABLET | ORAL | Status: DC | PRN

## 2017-05-26 MED ORDER — DIAZEPAM 5 MG PO TABS: 10.0000 mg | ORAL_TABLET | Freq: Four times a day (QID) | ORAL | Status: DC | PRN

## 2017-05-26 MED ORDER — ASPIRIN 325 MG PO TABS
325.0000 mg | ORAL_TABLET | Freq: Every day | ORAL | Status: DC
  Administered 2017-05-26 – 2017-05-27 (×2): 325 mg via ORAL
  Filled 2017-05-26 (×2): qty 1

## 2017-05-26 MED ORDER — ONDANSETRON HCL 4 MG PO TABS: 4.0000 mg | ORAL_TABLET | Freq: Four times a day (QID) | ORAL | Status: DC | PRN

## 2017-05-26 MED ORDER — ENOXAPARIN SODIUM 40 MG/0.4ML ~~LOC~~ SOLN
40.0000 mg | SUBCUTANEOUS | Status: DC
  Administered 2017-05-26: 40 mg via SUBCUTANEOUS
  Filled 2017-05-26: qty 0.4

## 2017-05-26 MED ORDER — OXYMETAZOLINE HCL 0.05 % NA SOLN: 1.0000 | Freq: Two times a day (BID) | NASAL | Status: DC

## 2017-05-26 MED ORDER — SODIUM CHLORIDE 0.9 % IV SOLN: 250.0000 mL | INTRAVENOUS | Status: DC | PRN

## 2017-05-26 MED ORDER — METFORMIN HCL 500 MG PO TABS
500.0000 mg | ORAL_TABLET | Freq: Two times a day (BID) | ORAL | Status: DC
  Administered 2017-05-27: 500 mg via ORAL
  Filled 2017-05-26 (×2): qty 1

## 2017-05-26 MED ORDER — IRBESARTAN 150 MG PO TABS
150.0000 mg | ORAL_TABLET | Freq: Every day | ORAL | Status: DC
  Administered 2017-05-26 – 2017-05-27 (×2): 150 mg via ORAL
  Filled 2017-05-26 (×2): qty 1

## 2017-05-26 MED ORDER — ACETAMINOPHEN 650 MG RE SUPP: 650.0000 mg | Freq: Four times a day (QID) | RECTAL | Status: DC | PRN

## 2017-05-26 MED ORDER — LOSARTAN POTASSIUM-HCTZ 100-25 MG PO TABS: 1.0000 | ORAL_TABLET | Freq: Every day | ORAL | Status: DC

## 2017-05-26 NOTE — ED Provider Notes (Signed)
[facility] EMERGENCY DEPARTMENT Provider Note   CSN: [id number] Arrival date & time: 05/26/17  1430     History   Chief Complaint Chief Complaint  Patient presents with  . Chest Pain    HPI Ronald Faulkner is a 66 y.o. male with history of arthritis, prostate cancer status post prostatectomy, colon polyps and diverticulitis status post partial colectomy, DM, HTN, HLD presents today with chief complaint acute onset, progressively worsening shortness of breath for 4 weeks and  intermittent chest pain.  He notes most recently he developed left-sided substernal chest pain which he describes as a stabbing burning sensation at around noon today.  He states the pain radiates to his back.  No aggravating or alleviating factors noted.  He denies associated diaphoresis, nausea, lightheadedness, or vomiting.  He did not do anything for his symptoms when they began.  He also notes bilateral lower extremity edema which began earlier today.  Shortness of breath worsens with exertion and he also endorses orthopnea and PND.  He denies recent travel or surgeries, hemoptysis, or prior history of DVT or PE.  He has not taken his blood pressure medications for 3 weeks now due to waiting for his new insurance to be active in the new year.  He states that he drinks approximately 2-4 liquor beverages daily.  He is a former smoker and quit 2 decades ago.  He states he does drink a fair amount of coffee and sodas daily.  He was evaluated by cardiology several years ago but has never had a stress test.  He does state that he was told that he had a "blockage in my heart "during preop clearance for his partial colectomy. He has chronic abdominal pain which he states is unchanged today.  The history is provided by the patient.    Past Medical History:  Diagnosis Date  . Arthritis   . Cancer ([place])   . Colon polyps   . Diabetes mellitus without complication ([place])   . Diverticulitis   . Hypertension     . IBS (irritable bowel syndrome)     Patient Active Problem List   Diagnosis Date Noted  . Family hx of colon cancer 09/18/2015  . Gout 09/18/2015  . Rectal bleeding 04/05/2012  . GERD (gastroesophageal reflux disease) 04/05/2012  . Chest pain 12/08/2011  . Abnormal EKG 12/08/2011  . HTN (hypertension) 12/08/2011    Past Surgical History:  Procedure Laterality Date  . ABDOMINAL SURGERY    . APPENDECTOMY    . BREAST SURGERY     benign lump at lt side  . COLON SURGERY    . COLONOSCOPY  03/14/2011   Procedure: COLONOSCOPY;  Surgeon: [name], MD;  Location[facility];  Service: Endoscopy;  Laterality: N/A;  1:00  . COLONOSCOPY N/A 11/15/2015   Procedure: COLONOSCOPY;  Surgeon: [name], MD;  Location: [facility];  Service: Endoscopy;  Laterality: N/A;  11:15  . PENILE PROSTHESIS IMPLANT     2016   . prostate cancer     2015. prostectomy       Home Medications    Prior to Admission medications   Medication Sig Start Date End Date Taking? Authorizing Provider  allopurinol (ZYLOPRIM) 300 MG tablet Take 300 mg by mouth 2 (two) times daily.  11/10/11  Yes [provider]  ibuprofen (ADVIL,MOTRIN) 800 MG tablet Take 800 mg by mouth every 6 (six) hours as needed for moderate pain.    Yes  [provider]  linaclotide (LINZESS) 145 MCG CAPS capsule Take 145 mcg by mouth daily before breakfast.   Yes [provider]  losartan-hydrochlorothiazide (HYZAAR) 100-25 MG tablet Take 1 tablet by mouth daily. 04/01/17  Yes [provider]  metFORMIN (GLUCOPHAGE) 500 MG tablet Take 1 tablet by mouth 2 (two) times daily with a meal.  05/21/14  Yes [provider]  omeprazole (PRILOSEC) 20 MG capsule Take 20 mg by mouth daily. 03/27/17  Yes [provider]  oxymetazoline (12 HOUR NASAL SPRAY) 0.05 % nasal spray Place 1 spray into both nostrils 2 (two) times daily.    Yes [provider]  diazepam (VALIUM) 10 MG  tablet Take 1 tablet (10 mg total) by mouth every 6 (six) hours as needed (Muscle spasm). Patient not taking: Reported on 05/26/2017 10/16/15   Daleen Bo, MD  HYDROcodone-acetaminophen [facility]) 5-325 MG tablet Take 1 tablet by mouth every 4 (four) hours as needed. Patient not taking: Reported on 05/26/2017 10/16/15   Daleen Bo, MD  polyethylene glycol-electrolytes (NULYTELY/GOLYTELY) 420 g solution Take 4,000 mLs by mouth once. Patient not taking: Reported on 05/26/2017 09/25/15   Butch Penny, NP    Family History Family History  Problem Relation Age of Onset  . Colon cancer Mother   . Coronary artery disease Maternal Grandmother   . Anesthesia problems Neg Hx   . Hypotension Neg Hx   . Malignant hyperthermia Neg Hx   . Pseudochol deficiency Neg Hx     Social History Social History   Tobacco Use  . Smoking status: Former Smoker    Packs/day: 1.00    Years: 35.00    Pack years: 35.00    Types: Cigarettes    Last attempt to quit: 06/03/1999    Years since quitting: 17.9  . Smokeless tobacco: Never Used  . Tobacco comment: quit 16 years ago  Substance Use Topics  . Alcohol use: Yes    Comment: occ  . Drug use: No    Comment: hx of use in his colege days     Allergies   Bee venom; Fish allergy; and Ivp dye [iodinated diagnostic agents]   Review of Systems Review of Systems  Constitutional: Negative for chills, diaphoresis and fever.  Respiratory: Positive for shortness of breath. Negative for cough.   Cardiovascular: Positive for chest pain and leg swelling. Negative for palpitations.  Gastrointestinal: Positive for abdominal pain (chronic, unchanged). Negative for nausea and vomiting.  Neurological: Negative for syncope and light-headedness.  All other systems reviewed and are negative.    Physical Exam Updated Vital Signs BP (!) 165/92   Pulse 70   Temp 98.2 F (36.8 C) (Oral)   Resp 15   Ht 5\' 7"  (1.702 m)   Wt 74.8 kg (165 lb)   SpO2 100%   BMI  25.84 kg/m   Physical Exam  Constitutional: He appears well-developed and well-nourished. No distress.  HENT:  Head: Normocephalic and atraumatic.  Eyes: Conjunctivae are normal. Right eye exhibits no discharge. Left eye exhibits no discharge.  Neck: Normal range of motion. Neck supple. No JVD present. No tracheal deviation present.  Cardiovascular: Normal rate and regular rhythm.  Pulses:      Radial pulses are 2+ on the right side, and 2+ on the left side.       Dorsalis pedis pulses are 2+ on the right side, and 2+ on the left side.       Posterior tibial pulses are 2+ on the  right side, and 2+ on the left side.  2+ radial and DP/PT pulses bl, negative Homan's bl, no pain with calf squeeze   Pulmonary/Chest: Effort normal.  Abdominal: Soft. Bowel sounds are normal. He exhibits no distension. There is tenderness.  Mild LUQ, epigastric, and RLQ pain. Patient states this is chronic and unchanged.   Musculoskeletal:       Right lower leg: He exhibits edema.       Left lower leg: He exhibits edema.  1+ pitting edema of the bilateral lower extremities around the ankles, right worse than left  Neurological: He is alert.  Skin: Skin is warm and dry. No erythema.  Psychiatric: He has a normal mood and affect. His behavior is normal.  Nursing note and vitals reviewed.    ED Treatments / Results  Labs (all labs ordered are listed, but only abnormal results are displayed) Labs Reviewed  BASIC METABOLIC PANEL - Abnormal; Notable for the following components:      Result Value   Glucose, Bld 103 (*)    All other components within normal limits  CBC - Abnormal; Notable for the following components:   Hemoglobin 12.5 (*)    HCT 38.0 (*)    All other components within normal limits  BRAIN NATRIURETIC PEPTIDE  I-STAT TROPONIN, ED  I-STAT TROPONIN, ED    EKG  EKG Interpretation  Date/Time:  Tuesday May 26 2017 14:37:11 EST Ventricular Rate:  77 PR Interval:  152 QRS  Duration: 148 QT Interval:  394 QTC Calculation: 445 R Axis:   122 Text Interpretation:  Normal sinus rhythm with sinus arrhythmia Right bundle branch block Left posterior fascicular block Abnormal ECG No significant change since last tracing Confirmed by Duffy Bruce [phone]) on 05/26/2017 6:57:08 PM       Radiology Dg Chest 2 View  Result Date: 05/26/2017 CLINICAL DATA:  Bilateral lower extremity swelling left chest pain. Patient reports symptoms for "a while". EXAM: CHEST  2 VIEW COMPARISON:  None. FINDINGS: Minimal linear atelectasis or scar is present in the lung bases. Lungs otherwise clear. No pneumothorax or pleural effusion. Heart size is upper normal. No acute bony abnormality. IMPRESSION: No acute disease. Electronically Signed   By: Inge Rise M.D.   On: 05/26/2017 15:25    Procedures Procedures (including critical care time)  Medications Ordered in ED Medications  irbesartan (AVAPRO) tablet 150 mg (150 mg Oral Given 05/26/17 1942)  hydrochlorothiazide (MICROZIDE) capsule 12.5 mg (12.5 mg Oral Given 05/26/17 1942)     Initial Impression / Assessment and Plan / ED Course  I have reviewed the triage vital signs and the nursing notes.  Pertinent labs & imaging results that were available during my care of the patient were reviewed by me and considered in my medical decision making (see chart for details).     Patient presents with progressive worsening shortness of breath for several weeks, chest pain which began earlier today as well as bilateral lower extremity edema which began earlier today.  Afebrile, hypertensive while in the ED.  He has not been taking his home medications for the past 3 weeks.  EKG shows no evidence of ST segment abnormality or arrhythmia no significant change from last tracing.  Serial troponins are negative.  Chest x-ray without evidence of acute cardia pulmonary abnormality.  BNP is within normal limits and remainder of lab work is  reassuring.  However, with a heart score of 5, I think it is reasonable to admit for observation and further  workup. Spoke with Dr. Laren Everts who agrees to assume care of patient and bring him into the hospital for further workup.  Patient seen and evaluated by Dr. Ellender Hose who agrees with assessment and plan at this time.  Final Clinical Impressions(s) / ED Diagnoses   Final diagnoses:  Atypical chest pain  SOB (shortness of breath)  Bilateral lower extremity edema    ED Discharge Orders    None       Debroah Baller 05/26/17 2031    Duffy Bruce, MD 05/27/17 [phone]

## 2017-05-26 NOTE — ED Triage Notes (Signed)
Pt reports left sided chest pain that began today at around 1200. Denies diaphoresis, radiation, dizziness. Endorses SOB x several weeks, and BLE swelling,

## 2017-05-26 NOTE — H&P (Signed)
[facility]                                                                                    Patient Demographics  Ronald Faulkner, is a 66 y.o. male  CSN: [id number]  MRN: [id number]  DOB - 1950/07/20  Admit Date - 05/26/2017  Outpatient Primary MD for the patient is [name], DO   With History of -  Past Medical History:  Diagnosis Date  . Arthritis   . Cancer ([place])   . Colon polyps   . Diabetes mellitus without complication ([place])   . Diverticulitis   . Hypertension   . IBS (irritable bowel syndrome)       Past Surgical History:  Procedure Laterality Date  . ABDOMINAL SURGERY    . APPENDECTOMY    . BREAST SURGERY     benign lump at lt side  . COLON SURGERY    . COLONOSCOPY  03/14/2011   Procedure: COLONOSCOPY;  Surgeon: [name], MD;  Location[facility];  Service: Endoscopy;  Laterality: N/A;  1:00  . COLONOSCOPY N/A 11/15/2015   Procedure: COLONOSCOPY;  Surgeon: [name], MD;  Location: [facility];  Service: Endoscopy;  Laterality: N/A;  11:15  . PENILE PROSTHESIS IMPLANT     2016   . prostate cancer     2015. prostectomy    in for   Chief Complaint  Patient presents with  . Chest Pain     HPI  Ronald Faulkner  is a 66 y.o. male, with past medical history is significant for hypertension, diabetes mellitus, colitis and prostate cancer status post prostatectomy in 2015 presenting today with increasing shortness of breath for the last 4 weeks getting worse with orthopnea. Patient reported the left-sided substernal chest pain described as stabbing/palpitations radiating to the back. No associated nausea and cold sweats and lightheadedness or loss of consciousness . He reports lower extremity edema as well. Has history of smoking in the past no history of drug abuse no history of cardiac stress test or cardiac cath.    Review of Systems    In addition to the HPI above,  No Fever-chills, No Headache, No changes with  Vision or hearing, No problems swallowing food or Liquids, No Abdominal pain, No Nausea or Vommitting, Bowel movements are regular, No Blood in stool or Urine, No dysuria, No new skin rashes or bruises, No new joints pains-aches,  No new weakness, tingling, numbness in any extremity, No recent weight gain or loss, No polyuria, polydypsia or polyphagia, No significant Mental Stressors.  A full 10 point Review of Systems was done, except as stated above, all other Review of Systems were negative.   Social History Social History   Tobacco Use  . Smoking status: Former Smoker    Packs/day: 1.00    Years: 35.00    Pack years: 35.00    Types: Cigarettes    Last attempt to quit: 06/03/1999    Years since quitting: 17.9  . Smokeless tobacco: Never Used  . Tobacco comment: quit 16 years ago  Substance Use Topics  . Alcohol use: Yes    Comment: occ  Family History Family History  Problem Relation Age of Onset  . Colon cancer Mother   . Coronary artery disease Maternal Grandmother   . Anesthesia problems Neg Hx   . Hypotension Neg Hx   . Malignant hyperthermia Neg Hx   . Pseudochol deficiency Neg Hx      Prior to Admission medications   Medication Sig Start Date End Date Taking? Authorizing Provider  allopurinol (ZYLOPRIM) 300 MG tablet Take 300 mg by mouth 2 (two) times daily.  11/10/11  Yes [provider]  ibuprofen (ADVIL,MOTRIN) 800 MG tablet Take 800 mg by mouth every 6 (six) hours as needed for moderate pain.    Yes [provider]  linaclotide (LINZESS) 145 MCG CAPS capsule Take 145 mcg by mouth daily before breakfast.   Yes [provider]  losartan-hydrochlorothiazide (HYZAAR) 100-25 MG tablet Take 1 tablet by mouth daily. 04/01/17  Yes [provider]  metFORMIN (GLUCOPHAGE) 500 MG tablet Take 1 tablet by mouth 2 (two) times daily with a meal.  05/21/14  Yes [provider]  omeprazole (PRILOSEC) 20 MG capsule Take 20  mg by mouth daily. 03/27/17  Yes [provider]  oxymetazoline (12 HOUR NASAL SPRAY) 0.05 % nasal spray Place 1 spray into both nostrils 2 (two) times daily.    Yes [provider]  diazepam (VALIUM) 10 MG tablet Take 1 tablet (10 mg total) by mouth every 6 (six) hours as needed (Muscle spasm). Patient not taking: Reported on 05/26/2017 10/16/15   Daleen Bo, MD  HYDROcodone-acetaminophen [facility]) 5-325 MG tablet Take 1 tablet by mouth every 4 (four) hours as needed. Patient not taking: Reported on 05/26/2017 10/16/15   Daleen Bo, MD  polyethylene glycol-electrolytes (NULYTELY/GOLYTELY) 420 g solution Take 4,000 mLs by mouth once. Patient not taking: Reported on 05/26/2017 09/25/15   Butch Penny, NP    Allergies  Allergen Reactions  . Bee Venom   . Fish Allergy   . Ivp Dye [Iodinated Diagnostic Agents] Swelling    Physical Exam  Vitals  Blood pressure (!) 165/92, pulse 70, temperature 98.2 F (36.8 C), temperature source Oral, resp. rate 15, height 5\' 7"  (1.702 m), weight 74.8 kg (165 lb), SpO2 100 %.   1. General extremely pleasant male well-developed, well-nourished  2. Normal affect and insight, Not Suicidal or Homicidal, Awake Alert, Oriented X 3.  3. No F.N deficits, grossly patient moving all extremities.  4. Ears and Eyes appear Normal, Conjunctivae clear, PERRLA. Moist Oral Mucosa.  5. Supple Neck, No JVD, No cervical lymphadenopathy appriciated, No Carotid Bruits.  6. Symmetrical Chest wall movement, Good air movement bilaterally, CTAB.  7. RRR, No Gallops, Rubs or Murmurs, No Parasternal Heave.  8. Positive Bowel Sounds, Abdomen Soft, protuberant, Scott a fall wound well-healed.  9.  No Cyanosis, Normal Skin Turgor, No Skin Rash or Bruise.  10. Good muscle tone,  joints appear normal , no effusions, Normal ROM.   Data Review  CBC Recent Labs  Lab 05/26/17 1447  WBC 6.1  HGB 12.5*  HCT 38.0*  PLT 256  MCV 88.6  MCH 29.1   MCHC 32.9  RDW 15.4   ------------------------------------------------------------------------------------------------------------------  Chemistries  Recent Labs  Lab 05/26/17 1447  NA 142  K 3.5  CL 108  CO2 24  GLUCOSE 103*  BUN 11  CREATININE 0.97  CALCIUM 9.1   ------------------------------------------------------------------------------------------------------------------ estimated creatinine clearance is 69.1 mL/min (by C-G formula based on SCr of 0.97 mg/dL). ------------------------------------------------------------------------------------------------------------------ No results for input(s): TSH,  T4TOTAL, T3FREE, THYROIDAB in the last 72 hours.  Invalid input(s): FREET3   Coagulation profile No results for input(s): INR, PROTIME in the last 168 hours. ------------------------------------------------------------------------------------------------------------------- No results for input(s): DDIMER in the last 72 hours. -------------------------------------------------------------------------------------------------------------------  Cardiac Enzymes No results for input(s): CKMB, TROPONINI, MYOGLOBIN in the last 168 hours.  Invalid input(s): CK ------------------------------------------------------------------------------------------------------------------ Invalid input(s): POCBNP   ---------------------------------------------------------------------------------------------------------------  Urinalysis No results found for: COLORURINE, APPEARANCEUR, LABSPEC, [place], GLUCOSEU, [place], BILIRUBINUR, KETONESUR, PROTEINUR, UROBILINOGEN, NITRITE, LEUKOCYTESUR  ----------------------------------------------------------------------------------------------------------------   Imaging results:   Dg Chest 2 View  Result Date: 05/26/2017 CLINICAL DATA:  Bilateral lower extremity swelling left chest pain. Patient reports symptoms for "a while". EXAM: CHEST  2  VIEW COMPARISON:  None. FINDINGS: Minimal linear atelectasis or scar is present in the lung bases. Lungs otherwise clear. No pneumothorax or pleural effusion. Heart size is upper normal. No acute bony abnormality. IMPRESSION: No acute disease. Electronically Signed   By: Inge Rise M.D.   On: 05/26/2017 15:25    My personal review of EKG: Sinus arrhythmia with a right bundle branch block at 77 bpm with left posterior fascicular block    Assessment & Plan  1. Chest pain      Serial troponins      Consider stress test in a.m.      Check EKG in a.m. 2. Hypertension      Change blood pressure medication to Avapro plus hydrochlorothiazide 3. Diabetes mellitus type 2     Continue with metformin and insulin sliding scale 4. Hyperlipidemia 5. History of diverticulitis and IBS 6. History of prostate cancer status post prostatectomy  DVT Prophylaxis Lovenox  AM Labs Ordered, also please review Full Orders  Family Communication: Admission, patients condition and plan of care including tests being ordered have been discussed with the patient and wife who indicate understanding and agree with the plan and Code Status.  Code Status full  Disposition Plan: Home  Time spent in minutes : 32 minutes  Condition GUARDED   @SIGNATURE @

## 2017-05-26 NOTE — ED Notes (Signed)
ED Provider at bedside. 

## 2017-05-27 ENCOUNTER — Other Ambulatory Visit: Payer: Self-pay | Admitting: Cardiology

## 2017-05-27 ENCOUNTER — Observation Stay (HOSPITAL_BASED_OUTPATIENT_CLINIC_OR_DEPARTMENT_OTHER): Payer: PPO

## 2017-05-27 DIAGNOSIS — Z7984 Long term (current) use of oral hypoglycemic drugs: Secondary | ICD-10-CM | POA: Diagnosis not present

## 2017-05-27 DIAGNOSIS — E119 Type 2 diabetes mellitus without complications: Secondary | ICD-10-CM | POA: Diagnosis not present

## 2017-05-27 DIAGNOSIS — R079 Chest pain, unspecified: Secondary | ICD-10-CM

## 2017-05-27 DIAGNOSIS — R0789 Other chest pain: Secondary | ICD-10-CM | POA: Diagnosis not present

## 2017-05-27 DIAGNOSIS — R0602 Shortness of breath: Secondary | ICD-10-CM | POA: Diagnosis not present

## 2017-05-27 DIAGNOSIS — I10 Essential (primary) hypertension: Secondary | ICD-10-CM

## 2017-05-27 DIAGNOSIS — M109 Gout, unspecified: Secondary | ICD-10-CM | POA: Diagnosis not present

## 2017-05-27 DIAGNOSIS — M1000 Idiopathic gout, unspecified site: Secondary | ICD-10-CM | POA: Diagnosis not present

## 2017-05-27 DIAGNOSIS — Z87891 Personal history of nicotine dependence: Secondary | ICD-10-CM | POA: Diagnosis not present

## 2017-05-27 DIAGNOSIS — R600 Localized edema: Secondary | ICD-10-CM | POA: Diagnosis not present

## 2017-05-27 DIAGNOSIS — Z23 Encounter for immunization: Secondary | ICD-10-CM | POA: Diagnosis not present

## 2017-05-27 DIAGNOSIS — Z79899 Other long term (current) drug therapy: Secondary | ICD-10-CM | POA: Diagnosis not present

## 2017-05-27 LAB — BASIC METABOLIC PANEL
Anion gap: 8 (ref 5–15)
BUN: 10 mg/dL (ref 6–20)
CHLORIDE: 106 mmol/L (ref 101–111)
CO2: 23 mmol/L (ref 22–32)
CREATININE: 0.82 mg/dL (ref 0.61–1.24)
Calcium: 8.6 mg/dL — ABNORMAL LOW (ref 8.9–10.3)
GFR calc Af Amer: >60 mL/min (ref >60)
GFR calc non Af Amer: >60 mL/min (ref >60)
GLUCOSE: 176 mg/dL — AB (ref 65–99)
Potassium: 3.1 mmol/L — ABNORMAL LOW (ref 3.5–5.1)
Sodium: 137 mmol/L (ref 135–145)

## 2017-05-27 LAB — CBC WITH DIFFERENTIAL/PLATELET
Basophils Absolute: 0 10*3/uL (ref 0.0–0.1)
Basophils Relative: 0 %
EOS PCT: 4 %
Eosinophils Absolute: 0.2 10*3/uL (ref 0.0–0.7)
HCT: 36.9 % — ABNORMAL LOW (ref 39.0–52.0)
HEMOGLOBIN: 12.3 g/dL — AB (ref 13.0–17.0)
LYMPHS ABS: 1.8 10*3/uL (ref 0.7–4.0)
LYMPHS PCT: 37 %
MCH: 29.2 pg (ref 26.0–34.0)
MCHC: 33.3 g/dL (ref 30.0–36.0)
MCV: 87.6 fL (ref 78.0–100.0)
Monocytes Absolute: 0.4 10*3/uL (ref 0.1–1.0)
Monocytes Relative: 8 %
Neutro Abs: 2.5 10*3/uL (ref 1.7–7.7)
Neutrophils Relative %: 51 %
Platelets: 242 10*3/uL (ref 150–400)
RBC: 4.21 MIL/uL — AB (ref 4.22–5.81)
RDW: 15.6 % — ABNORMAL HIGH (ref 11.5–15.5)
WBC: 4.9 10*3/uL (ref 4.0–10.5)

## 2017-05-27 LAB — LIPID PANEL
CHOLESTEROL: 195 mg/dL (ref 0–200)
HDL: 45 mg/dL (ref >40)
LDL Cholesterol: 99 mg/dL (ref 0–99)
TRIGLYCERIDES: 253 mg/dL — AB (ref <150)
Total CHOL/HDL Ratio: 4.3 RATIO
VLDL: 51 mg/dL — AB (ref 0–40)

## 2017-05-27 LAB — HEMOGLOBIN A1C
Hgb A1c MFr Bld: 6.3 % — ABNORMAL HIGH (ref 4.8–5.6)
Mean Plasma Glucose: 134.11 mg/dL

## 2017-05-27 LAB — TROPONIN I
Troponin I: <0.03 ng/mL (ref <0.03)
Troponin I: <0.03 ng/mL (ref <0.03)

## 2017-05-27 LAB — ECHOCARDIOGRAM COMPLETE
Height: 67 in
Weight: 2636.8 oz

## 2017-05-27 LAB — GLUCOSE, CAPILLARY
GLUCOSE-CAPILLARY: 100 mg/dL — AB (ref 65–99)
Glucose-Capillary: 105 mg/dL — ABNORMAL HIGH (ref 65–99)

## 2017-05-27 MED ORDER — LOSARTAN POTASSIUM-HCTZ 100-25 MG PO TABS: 1.0000 | ORAL_TABLET | Freq: Every day | ORAL | 0 refills | Status: DC

## 2017-05-27 MED ORDER — ATORVASTATIN CALCIUM 40 MG PO TABS: 40.0000 mg | ORAL_TABLET | Freq: Every day | ORAL | Status: DC

## 2017-05-27 MED ORDER — ATORVASTATIN CALCIUM 40 MG PO TABS: 40.0000 mg | ORAL_TABLET | Freq: Every day | ORAL | 0 refills | Status: DC

## 2017-05-27 MED ORDER — POTASSIUM CHLORIDE CRYS ER 20 MEQ PO TBCR
40.0000 meq | EXTENDED_RELEASE_TABLET | Freq: Two times a day (BID) | ORAL | Status: DC
Start: 2017-05-27 — End: 2017-05-27
  Administered 2017-05-27: 40 meq via ORAL
  Filled 2017-05-27: qty 2

## 2017-05-27 MED ORDER — ASPIRIN EC 81 MG PO TBEC: 81.0000 mg | DELAYED_RELEASE_TABLET | Freq: Every day | ORAL | 0 refills | Status: AC

## 2017-05-27 NOTE — Consult Note (Signed)
Cardiology Consultation:   Patient ID: Ronald Faulkner; [id number]; 1951/02/07   Admit date: 05/26/2017 Date of Consult: 05/27/2017  Primary Care Provider: [name], DO Primary Cardiologist: New (Dr. Marlou Porch)  Patient Profile:   Ronald Faulkner is a 66 y.o. male with a hx of DM, HTN, RBBB, nonischemic NST in 2013  who is being seen today for the evaluation of chest pain at the request of Dr. Alfredia Ferguson, Internal Medicine.  History of Present Illness:   Mr. Hence was seen once by Dr. Percival Spanish in 2013 for evaluation of chest pain. Per documentation, symptoms were mixed with some atypical greater than typical features. Given baseline abnormal EKG with RBBB, he was ordered to undergo a pharmacologic NST. This was done 12/2011 and was negative for ischemia. 2D Echo was also normal with normal LVEF. Pt was lost to f/u. Not seen by our practice since.   His cardiac risk factors include DM and HTN. Other PMH includes h/o prostate CA s/p prostectomy in 2015, IBS and diverticulosis.   He presented to the  [facility] ED on 05/26/17 with a complaint of dyspnea and CP. He has had intermitted exertional dyspnea x 4 weeks. Improves with rest. Also mild orthopnea. Sleeping with head of bed elevated. He has also noticed some LEE around his ankles. He admits that he stopped taking his BP meds about 3 weeks ago. He will have new medical insurance in Jan and was waiting until then to get refills. He was on a diuretic. He denies any excess sodium to his knowledge. No resting dyspnea.  His CP is atypical. Left sided sharp stabbing pain, lasting secs at a time. Some radiation to the back. No exacerbating factors.   Pt admitted by IM. EKG shows Normal sinus rhythm Right bundle branch block Left posterior fascicular block. Troponins are negative x 3. BNP at 64. CXR negative. BP initially was elevated at 187/102. IM restarted antihypertensives. He is no Avapro and HCTZ. BP is improved and back WNL.      Past Medical  History:  Diagnosis Date  . Arthritis   . Cancer ([place])   . Colon polyps   . Diabetes mellitus without complication ([place])   . Diverticulitis   . Hypertension   . IBS (irritable bowel syndrome)     Past Surgical History:  Procedure Laterality Date  . ABDOMINAL SURGERY    . APPENDECTOMY    . BREAST SURGERY     benign lump at lt side  . COLON SURGERY    . COLONOSCOPY  03/14/2011   Procedure: COLONOSCOPY;  Surgeon: [name], MD;  Location: [facility];  Service: Endoscopy;  Laterality: N/A;  1:00  . COLONOSCOPY N/A 11/15/2015   Procedure: COLONOSCOPY;  Surgeon: [name], MD;  Location: [facility];  Service: Endoscopy;  Laterality: N/A;  11:15  . PENILE PROSTHESIS IMPLANT     2016   . prostate cancer     2015. prostectomy     Home Medications:  Prior to Admission medications   Medication Sig Start Date End Date Taking? Authorizing Provider  allopurinol (ZYLOPRIM) 300 MG tablet Take 300 mg by mouth 2 (two) times daily.  11/10/11  Yes [provider]  ibuprofen (ADVIL,MOTRIN) 800 MG tablet Take 800 mg by mouth every 6 (six) hours as needed for moderate pain.    Yes [provider]  linaclotide (LINZESS) 145 MCG CAPS capsule Take 145 mcg by mouth daily before breakfast.   Yes [provider]  losartan-hydrochlorothiazide (HYZAAR) 100-25 MG tablet Take 1 tablet by mouth daily. 04/01/17  Yes [provider]  metFORMIN (GLUCOPHAGE) 500 MG tablet Take 1 tablet by mouth 2 (two) times daily with a meal.  05/21/14  Yes [provider]  omeprazole (PRILOSEC) 20 MG capsule Take 20 mg by mouth daily. 03/27/17  Yes [provider]  oxymetazoline (12 HOUR NASAL SPRAY) 0.05 % nasal spray Place 1 spray into both nostrils 2 (two) times daily.    Yes [provider]  diazepam (VALIUM) 10 MG tablet Take 1 tablet (10 mg total) by mouth every 6 (six) hours as needed (Muscle spasm). Patient not taking: Reported on 05/26/2017  10/16/15   Daleen Bo, MD  HYDROcodone-acetaminophen [facility]) 5-325 MG tablet Take 1 tablet by mouth every 4 (four) hours as needed. Patient not taking: Reported on 05/26/2017 10/16/15   Daleen Bo, MD  polyethylene glycol-electrolytes (NULYTELY/GOLYTELY) 420 g solution Take 4,000 mLs by mouth once. Patient not taking: Reported on 05/26/2017 09/25/15   Butch Penny, NP    Inpatient Medications: Scheduled Meds: . allopurinol  300 mg Oral BID  . aspirin  325 mg Oral Daily  . enoxaparin (LOVENOX) injection  40 mg Subcutaneous Q24H  . hydrochlorothiazide  12.5 mg Oral Daily  . Influenza vac split quadrivalent PF  0.5 mL Intramuscular Tomorrow-1000  . insulin aspart  0-9 Units Subcutaneous TID WC  . irbesartan  150 mg Oral Daily  . linaclotide  145 mcg Oral QAC breakfast  . metFORMIN  500 mg Oral BID WC  . sodium chloride flush  3 mL Intravenous Q12H   Continuous Infusions: . sodium chloride     PRN Meds: sodium chloride, acetaminophen **OR** acetaminophen, diazepam, HYDROcodone-acetaminophen, loratadine, ondansetron **OR** ondansetron (ZOFRAN) IV, sodium chloride flush, zolpidem  Allergies:    Allergies  Allergen Reactions  . Bee Venom   . Fish Allergy   . Ivp Dye [Iodinated Diagnostic Agents] Swelling    Social History:   Social History   Socioeconomic History  . Marital status: Married    Spouse name: Not on file  . Number of children: 2  . Years of education: Not on file  . Highest education level: Not on file  Social Needs  . Financial resource strain: Not on file  . Food insecurity - worry: Not on file  . Food insecurity - inability: Not on file  . Transportation needs - medical: Not on file  . Transportation needs - non-medical: Not on file  Occupational History    Employer: [employer]  Tobacco Use  . Smoking status: Former Smoker    Packs/day: 1.00    Years: 35.00    Pack years: 35.00    Types: Cigarettes    Last attempt to quit: 06/03/1999     Years since quitting: 17.9  . Smokeless tobacco: Never Used  . Tobacco comment: quit 16 years ago  Substance and Sexual Activity  . Alcohol use: Yes    Comment: occ  . Drug use: No    Comment: hx of use in his colege days  . Sexual activity: Not on file  Other Topics Concern  . Not on file  Social History Narrative   Lives at home with wife and daughter and her three children.      Family History:    Family History  Problem Relation Age of Onset  . Colon cancer Mother   . Coronary artery disease Maternal Grandmother   . Anesthesia problems Neg Hx   .  Hypotension Neg Hx   . Malignant hyperthermia Neg Hx   . Pseudochol deficiency Neg Hx      ROS:  Please see the history of present illness.  ROS  All other ROS reviewed and negative.     Physical Exam/Data:   Vitals:   05/26/17 2000 05/26/17 2030 05/26/17 2104 05/27/17 0534  BP: (!) 173/97 (!) 152/87 (!) 152/77 137/86  Pulse: 72 73 78 80  Resp: (!) 23 19 18 17   Temp:   98.1 F (36.7 C) 97.9 F (36.6 C)  TempSrc:   Oral Oral  SpO2: 100% 99% 98% 96%  Weight:   164 lb 12.8 oz (74.8 kg)   Height:   5\' 7"  (1.702 m)     Intake/Output Summary (Last 24 hours) at 05/27/2017 0746 Last data filed at 05/27/2017 0535 Gross per 24 hour  Intake -  Output 800 ml  Net -800 ml   Filed Weights   05/26/17 1436 05/26/17 2104  Weight: 165 lb (74.8 kg) 164 lb 12.8 oz (74.8 kg)   Body mass index is 25.81 kg/m.  General:  Well nourished, well developed, in no acute distress HEENT: normal Lymph: no adenopathy Neck: no JVD Endocrine:  No thryomegaly Vascular: No carotid bruits; FA pulses 2+ bilaterally without bruits  Cardiac:  normal S1, S2; RRR; no murmur  Lungs:  clear to auscultation bilaterally, no wheezing, rhonchi or rales  Abd: soft, nontender, no hepatomegaly  Ext: no edema Musculoskeletal:  No deformities, BUE and BLE strength normal and equal Skin: warm and dry  Neuro:  CNs 2-12 intact, no focal abnormalities  noted Psych:  Normal affect   EKG:  The EKG was personally reviewed and demonstrates:  Normal sinus rhythm Right bundle branch block Left posterior fascicular block Telemetry:  Telemetry was personally reviewed and demonstrates:  NSR   Relevant CV Studies:  NST 12/11/2011 Normal. No ischemia. See scanned report under imaging.   2D Echo 12/31/2011 Study Conclusions  - Left ventricle: The cavity size was normal. Wall thickness was increased in a pattern of mild LVH. Systolic function was normal. The estimated ejection fraction was in the range of 60% to 65%. Wall motion was normal; there were no regional wall motion abnormalities. Doppler parameters are consistent with abnormal left ventricular relaxation (grade 1 diastolic dysfunction). - Ventricular septum: Septal motion showed paradox. - Mitral valve: Trivial regurgitation. - Tricuspid valve: Trivial regurgitation. Peak RV-RA gradient: 47mm Hg (S). - Pericardium, extracardiac: There was no pericardial effusion. Transthoracic echocardiography. M-mode, complete 2D, spectral Doppler, and color Doppler. Height: Height: 172.7cm. Height: 68in. Weight: Weight: 79.8kg. Weight: 175.6lb. Body mass index: BMI: 26.8kg/m^2. Body surface area:  BSA: 1.8m^2. Blood pressure:   120/80  Laboratory Data:  Chemistry Recent Labs  Lab 05/26/17 1447 05/27/17 0342  NA 142 137  K 3.5 3.1*  CL 108 106  CO2 24 23  GLUCOSE 103* 176*  BUN 11 10  CREATININE 0.97 0.82  CALCIUM 9.1 8.6*  GFRNONAA >60 >60  GFRAA >60 >60  ANIONGAP 10 8    No results for input(s): PROT, ALBUMIN, AST, ALT, ALKPHOS, BILITOT in the last 168 hours. Hematology Recent Labs  Lab 05/26/17 1447  WBC 6.1  RBC 4.29  HGB 12.5*  HCT 38.0*  MCV 88.6  MCH 29.1  MCHC 32.9  RDW 15.4  PLT 256   Cardiac Enzymes Recent Labs  Lab 05/26/17 2143 05/27/17 0342  TROPONINI <0.03 <0.03    Recent Labs  Lab 05/26/17 1457 05/26/17 1803  TROPIPOC 0.00 0.00    BNP Recent Labs  Lab 05/26/17 1447  BNP 64.4    DDimer No results for input(s): DDIMER in the last 168 hours.  Radiology/Studies:  Dg Chest 2 View  Result Date: 05/26/2017 CLINICAL DATA:  Bilateral lower extremity swelling left chest pain. Patient reports symptoms for "a while". EXAM: CHEST  2 VIEW COMPARISON:  None. FINDINGS: Minimal linear atelectasis or scar is present in the lung bases. Lungs otherwise clear. No pneumothorax or pleural effusion. Heart size is upper normal. No acute bony abnormality. IMPRESSION: No acute disease. Electronically Signed   By: Inge Rise M.D.   On: 05/26/2017 15:25    Assessment and Plan:   1. Exertional Dyspnea: BNP is WNL at 64. CXR negative. He does not appear volume overloaded. H/H is WNL. Cardiac enzymes are negative. CP is very atypical. BP on admit was elevated, in the setting of medication noncompliance. BP improved after re initiation of BP meds. At minimum, would check a 2D echo to assess LVF and valvular anatomy. We will also consider noninvasive ischemic testing to r/o CAD.   2. Chest Pain: atypical. Left sided, sharp, shooting pain, lasting <1 min at a time. Cardiac enzymes are negative x 3. EKG with chronic RBBB. NST in 2013 was normal. Given risk factors including DM and HTN, may consider risk stratification with repeat NST vs coronary CTA. Ischemic w/u can be completed at an outpatient. Continue management of other cardiac risk factors. We will also check a FLP to assess lipids. Recommend daily 81 mg ASA.    3. HTN: initially elevated in the ED in the 180s/100s, in the setting of medication noncompliance. Stopped BP meds 3 weeks ago. Waiting for new insurance in Jan to refill meds. ARB and diuretic restarted. BP improved.   4. DM: per IM. Recommend initiation of statin therapy. Will add Lipitor 40 mg. Can follow as outpatient.   5. Hypokalemia: K 3.1. Kdur ordered.    For questions or updates, please contact  [place] Please consult [email or web address] for contact info under Cardiology/STEMI.   Signed, Lyda Jester, PA-C  05/27/2017 7:46 AM   Personally seen and examined. Agree with above. 66 year old male with diabetes, essential hypertension, diabetes with hypertension with long-standing right bundle branch block prior nuclear stress test in 2013 low risk here with episodes of exertional dyspnea over the last several days, normal BNP, normal chest x-ray, previously normal EF as well as a "feeling" in his chest usually sharp shooting pain lasting several seconds.  Cardiac markers have been negative.  Atypical features.  He has been out of his medications and came in quite hypertensive.  He noted some edema in his lower extremities which have resolved overnight.  Exam reveals alert male with no neurologic features, moves all extremities, heart is regular rate and rhythm no murmurs rubs or gallops lungs are clear abdomen is soft no edema.  He and his wife both have Holiday representative on.  Assessment: Diabetes with hypertension, atypical chest pain, right bundle branch block, hypertension poorly controlled initially due to medication nonadherence. -We will set him up for an outpatient coronary CT scan to evaluate his coronary anatomy.  Prior stress test was unremarkable in 2013.  Atypical features.  Diabetes is a coronary artery disease equivalent.  Give metoprolol 50 mg x1 prior to CT, on day of CT scan.  We will order this test and have it scheduled possibly next week. -We will check an echocardiogram here in the hospital to  make sure there is nothing grossly abnormal structurally. - We have added a statin given his concomitant diabetes.  Checking lipid profile. - Comfortable with discharge.  We will follow-up with results of CT scan.  [name], MD

## 2017-05-27 NOTE — Discharge Summary (Signed)
Physician Discharge Summary  Ronald Faulkner QMV:[id number] DOB: [birth year] DOA: 05/26/2017  PCP: [name], DO  Admit date: 05/26/2017 Discharge date: 05/27/2017  Admitted From: Home Disposition:  Home  Recommendations for Outpatient Follow-up:  1. Follow up with PCP in 1-2 weeks 2. Folow up with Cardiology Dr. Marlou Porch as an outpatient. Cardiology office will call to schedule an outpatient Coronary CT and will be given 50 mg x1 prior to CT on the day of CT 3. Please obtain CMP/CBC, Mag, Phos in one week 4. Please follow up on the following pending results:  Home Health: No Equipment/Devices: None    Discharge Condition: Stable CODE STATUS: FULL CODE  Diet recommendation: Heart Healthy Carb Modified Diet  Brief/Interim Summary: Ronald Faulkner  is a 66 y.o. male, with past medical history is significant for hypertension, diabetes mellitus, colitis and prostate cancer status post prostatectomy in 2015 presenting today with increasing shortness of breath for the last 4 weeks getting worse with orthopnea. Patient reported the left-sided substernal chest pain described as stabbing/palpitations radiating to the back. No associated nausea and cold sweats and lightheadedness or loss of consciousness . He reports lower extremity edema as well. Has history of smoking in the past no history of drug abuse no history of cardiac stress test or cardiac cath.  Discharge Diagnoses:  Active Problems:   Atypical chest pain   Bilateral lower extremity edema   SOB (shortness of breath)  Chest Pain r/o'd ACS -Atypical in Nature -Has Risk [place] with HTN, HLD, and DM -Given ASA 325 mg while Hospitalized -C/w Atorvastatin at D/C and start 81 mg ASA -Troponin x3 Negative -ECHOCardiogram with EF of 50-55% with Normal Wall Motion -Cardiology recommending outpatient Coronary CTA and will be arranged as an outpatient -Lipid Panel as below -Cardiology cleared patient for D/C and will need to follow up  with Cardiology and PCP at D/C   Hypertension -Has Hx of Non-Compliance  -BP now improved to 137/86 -C/w Home Medications of Lisinopril-HCTZ and will refill  -Follow up with PCP and Cariology as an outpatient for further BP Management   Diabetes Mellitus Type 2 -HbA1c of 6.3 -Continue with Metformin 500 mg po BID -Was placed on Sensitive Novolog SSI AC -CBG's ranging from 78-105  Hyperlipidemia -Lipid Panel Done and showed Cholesterol Level of 195, HDL of 45, LDL of 99, TG of 253, and VLDL of 51 -C/w Atorvastatin 40 mg po Daily   History of Diverticulitis and IBS -C/w Home Linaclotide 145 mcg po Daily   History of Prostrate Cancer s/p Prostatectomy  -Follow up with Oncology and Urology as an outpatient  Normocytic Anemia -Hb/Hct went from 12.5/38.0 -> 12.3/36.9 -Check Anemia Panel as an outpatient -Follow up with PCP for further workup  Hypokalemia -Patient's K+ was 3.1 -Replete with po KCl 40 mEQ x2 -Continue to Monitor and Replete as necessary -Repeat CMP as an outpatient.  Hx of Gout -C/w Allopurinol 300 mg po BID  Diastolic Dysfunction -Grade 1 per ECHO -Not in Exacerbation and LE edema has improved -Follow up with Cardiology as an outpatient for further evaluation and management   Discharge Instructions  Discharge Instructions    Call MD for:  difficulty breathing, headache or visual disturbances   Complete by:  As directed    Call MD for:  extreme fatigue   Complete by:  As directed    Call MD for:  hives   Complete by:  As directed    Call MD for:  persistant dizziness or light-headedness  Complete by:  As directed    Call MD for:  persistant nausea and vomiting   Complete by:  As directed    Call MD for:  redness, tenderness, or signs of infection (pain, swelling, redness, odor or green/yellow discharge around incision site)   Complete by:  As directed    Call MD for:  severe uncontrolled pain   Complete by:  As directed    Call MD for:   temperature >100.4   Complete by:  As directed    Diet - low sodium heart healthy   Complete by:  As directed    Diet Carb Modified   Complete by:  As directed    Discharge instructions   Complete by:  As directed    Follow up with PCP and Cardiology as an outpatient for Coronary CTA and evaluation of Diastolic Dysfunction. Take all medications as prescribed. If symptoms change or worsen please return to the ED for Evaluation.   Increase activity slowly   Complete by:  As directed      Allergies as of 05/27/2017      Reactions   Bee Venom    Fish Allergy    Ivp Dye [iodinated Diagnostic Agents] Swelling      Medication List    STOP taking these medications   polyethylene glycol-electrolytes 420 g solution Commonly known as:  NuLYTELY/GoLYTELY     TAKE these medications   12 HOUR NASAL SPRAY 0.05 % nasal spray Generic drug:  oxymetazoline Place 1 spray into both nostrils 2 (two) times daily.   allopurinol 300 MG tablet Commonly known as:  ZYLOPRIM Take 300 mg by mouth 2 (two) times daily.   aspirin EC 81 MG tablet Take 1 tablet (81 mg total) by mouth daily.   atorvastatin 40 MG tablet Commonly known as:  LIPITOR Take 1 tablet (40 mg total) by mouth daily at 6 PM.   diazepam 10 MG tablet Commonly known as:  VALIUM Take 1 tablet (10 mg total) by mouth every 6 (six) hours as needed (Muscle spasm).   HYDROcodone-acetaminophen 5-325 MG tablet Commonly known as:  NORCO Take 1 tablet by mouth every 4 (four) hours as needed.   ibuprofen 800 MG tablet Commonly known as:  ADVIL,MOTRIN Take 800 mg by mouth every 6 (six) hours as needed for moderate pain.   LINZESS 145 MCG Caps capsule Generic drug:  linaclotide Take 145 mcg by mouth daily before breakfast.   losartan-hydrochlorothiazide 100-25 MG tablet Commonly known as:  HYZAAR Take 1 tablet by mouth daily.   metFORMIN 500 MG tablet Commonly known as:  GLUCOPHAGE Take 1 tablet by mouth 2 (two) times daily with a  meal.   omeprazole 20 MG capsule Commonly known as:  PRILOSEC Take 20 mg by mouth daily.      Follow-up Information    [place] [facility] Follow up.   Specialty:  Cardiology Why:  cardiology will call you with an appointment to get heart scan study in our office to check for blockages.  Contact information: [address], [address] [place]       [name], DO. Call in 1 week(s).   Why:  Follow up within 1 week for Hospital D/C Contact information: [address] [place] [address] [phone]          Allergies  Allergen Reactions  . Bee Venom   . Fish Allergy   . Ivp Dye [Iodinated Diagnostic Agents] Swelling  Consultations:  Cardiology Dr. [name]  Procedures/Studies: Dg Chest 2 View  Result Date: 05/26/2017 CLINICAL DATA:  Bilateral lower extremity swelling left chest pain. Patient reports symptoms for "a while". EXAM: CHEST  2 VIEW COMPARISON:  None. FINDINGS: Minimal linear atelectasis or scar is present in the lung bases. Lungs otherwise clear. No pneumothorax or pleural effusion. Heart size is upper normal. No acute bony abnormality. IMPRESSION: No acute disease. Electronically Signed   By: Inge Rise M.D.   On: 05/26/2017 15:25   ECHOCARDIOGRAM ------------------------------------------------------------------- Study Conclusions  - Left ventricle: The cavity size was mildly dilated. Wall   thickness was normal. Systolic function was normal. The estimated   ejection fraction was in the range of 50% to 55%. Wall motion was   normal; there were no regional wall motion abnormalities. Doppler   parameters are consistent with abnormal left ventricular   relaxation (grade 1 diastolic dysfunction).  Impressions:  - Normal LV systolic function; mild diastolic dysfunction; mild   LVE; trace MR and TR.  Subjective: Seen and examined and was CP free and leg swelling had  improved. No SOB. Understands he needs to comply with medications and follow up with Cardiology for Coronary CTA.  Discharge Exam: Vitals:   05/26/17 2104 05/27/17 0534  BP: (!) 152/77 137/86  Pulse: 78 80  Resp: 18 17  Temp: 98.1 F (36.7 C) 97.9 F (36.6 C)  SpO2: 98% 96%   Vitals:   05/26/17 2000 05/26/17 2030 05/26/17 2104 05/27/17 0534  BP: (!) 173/97 (!) 152/87 (!) 152/77 137/86  Pulse: 72 73 78 80  Resp: (!) 23 19 18 17   Temp:   98.1 F (36.7 C) 97.9 F (36.6 C)  TempSrc:   Oral Oral  SpO2: 100% 99% 98% 96%  Weight:   74.8 kg (164 lb 12.8 oz)   Height:   5\' 7"  (1.702 m)    General: Pt is alert, awake, not in acute distress Cardiovascular: RRR, S1/S2 +, no rubs, no gallops Respiratory: CTA bilaterally, no wheezing, no rhonchi Abdominal: Soft, NT, ND, bowel sounds + Extremities: no appreciable edema, no cyanosis  The results of significant diagnostics from this hospitalization (including imaging, microbiology, ancillary and laboratory) are listed below for reference.    Microbiology: No results found for this or any previous visit (from the past 240 hour(s)).   Labs: BNP (last 3 results) Recent Labs    05/26/17 1447  BNP 31.4   Basic Metabolic Panel: Recent Labs  Lab 05/26/17 1447 05/27/17 0342  NA 142 137  K 3.5 3.1*  CL 108 106  CO2 24 23  GLUCOSE 103* 176*  BUN 11 10  CREATININE 0.97 0.82  CALCIUM 9.1 8.6*   Liver Function Tests: No results for input(s): AST, ALT, ALKPHOS, BILITOT, PROT, ALBUMIN in the last 168 hours. No results for input(s): LIPASE, AMYLASE in the last 168 hours. No results for input(s): AMMONIA in the last 168 hours. CBC: Recent Labs  Lab 05/26/17 1447 05/27/17 0846  WBC 6.1 4.9  NEUTROABS  --  2.5  HGB 12.5* 12.3*  HCT 38.0* 36.9*  MCV 88.6 87.6  PLT 256 242   Cardiac Enzymes: Recent Labs  Lab 05/26/17 2143 05/27/17 0342 05/27/17 0846  TROPONINI <0.03 <0.03 <0.03   BNP: Invalid input(s):  POCBNP CBG: Recent Labs  Lab 05/26/17 2153 05/27/17 0740 05/27/17 1144  GLUCAP 78 105* 100*   D-Dimer No results for input(s): DDIMER in the last 72 hours. Hgb A1c Recent Labs  05/27/17 0844  HGBA1C 6.3*   Lipid Profile Recent Labs    05/27/17 0844  CHOL 195  HDL 45  LDLCALC 99  TRIG 253*  CHOLHDL 4.3   Thyroid function studies No results for input(s): TSH, T4TOTAL, T3FREE, THYROIDAB in the last 72 hours.  Invalid input(s): FREET3 Anemia work up No results for input(s): VITAMINB12, FOLATE, FERRITIN, TIBC, IRON, RETICCTPCT in the last 72 hours. Urinalysis No results found for: COLORURINE, APPEARANCEUR, LABSPEC, [place], GLUCOSEU, HGBUR, BILIRUBINUR, KETONESUR, PROTEINUR, UROBILINOGEN, NITRITE, LEUKOCYTESUR Sepsis Labs Invalid input(s): PROCALCITONIN,  WBC,  LACTICIDVEN Microbiology No results found for this or any previous visit (from the past 240 hour(s)).  Time coordinating discharge: 35 minutes  SIGNED:  Kerney Elbe, DO [facility] 05/27/2017, 12:56 PM Pager [phone]  If 7PM-7AM, please contact night-coverage [email or web address] Password [email or web address]

## 2017-05-27 NOTE — Discharge Instructions (Signed)
Chest Wall Pain °Chest wall pain is pain in or around the bones and muscles of your chest. Sometimes, an injury causes this pain. Sometimes, the cause may not be known. This pain may take several weeks or longer to get better. °Follow these instructions at home: °Pay attention to any changes in your symptoms. Take these actions to help with your pain: °· Rest as told by your doctor. °· Avoid activities that cause pain. Try not to use your chest, belly (abdominal), or side muscles to lift heavy things. °· If directed, apply ice to the painful area: °? Put ice in a plastic bag. °? Place a towel between your skin and the bag. °? Leave the ice on for 20 minutes, 2-3 times per day. °· Take over-the-counter and prescription medicines only as told by your doctor. °· Do not use tobacco products, including cigarettes, chewing tobacco, and e-cigarettes. If you need help quitting, ask your doctor. °· Keep all follow-up visits as told by your doctor. This is important. ° °Contact a doctor if: °· You have a fever. °· Your chest pain gets worse. °· You have new symptoms. °Get help right away if: °· You feel sick to your stomach (nauseous) or you throw up (vomit). °· You feel sweaty or light-headed. °· You have a cough with phlegm (sputum) or you cough up blood. °· You are short of breath. °This information is not intended to replace advice given to you by your health care provider. Make sure you discuss any questions you have with your health care provider. °Document Released: 11/05/2007 Document Revised: 10/25/2015 Document Reviewed: 08/14/2014 °Elsevier Interactive Patient Education © 2018 Elsevier Inc. ° °

## 2017-05-27 NOTE — Progress Notes (Signed)
Order for coronary CT placed. Our office will call pt to schedule appt.  Lyda Jester, PA-C

## 2017-05-27 NOTE — Care Management Obs Status (Signed)
[facility] NOTIFICATION   Patient Details  Name: Ronald Faulkner MRN: [id number] Date of Birth: [birth year]   Medicare Observation Status Notification Given:  Yes    Carles Collet, RN 05/27/2017, 8:59 AM

## 2017-06-25 ENCOUNTER — Telehealth: Payer: Self-pay | Admitting: *Deleted

## 2017-06-25 DIAGNOSIS — R0789 Other chest pain: Secondary | ICD-10-CM

## 2017-06-25 DIAGNOSIS — I10 Essential (primary) hypertension: Secondary | ICD-10-CM

## 2017-06-25 DIAGNOSIS — Z01818 Encounter for other preprocedural examination: Secondary | ICD-10-CM

## 2017-06-25 DIAGNOSIS — R9431 Abnormal electrocardiogram [ECG] [EKG]: Secondary | ICD-10-CM

## 2017-06-25 NOTE — Telephone Encounter (Signed)
Lajuana Matte, RN        Mr. Andal is schedule for Cardiac Ct on 07-03-17  With Follow up with Claiborne Billings on 07-07-17.  Patient need 13 hr. Prep for Cardiac Ct due to he is allergic to Contrast- Patient will also need [employer] letter sent to him.  He stated that he use [facility] in [place].     Will contact patient in regards to testing

## 2017-06-29 MED ORDER — METOPROLOL TARTRATE 50 MG PO TABS: 50.0000 mg | ORAL_TABLET | Freq: Once | ORAL | 0 refills | Status: DC

## 2017-06-29 MED ORDER — PREDNISONE 50 MG PO TABS: ORAL_TABLET | ORAL | 0 refills | Status: DC

## 2017-06-29 NOTE — Telephone Encounter (Addendum)
SPOKE TO PATIENT. INSTRUCTION GIVEN TO PATIENT IN REGARDS TO UPCOMING CORONARY CTA ON 2/19.  PATIENT VERBALIZED UNDERSTANDING. PATIENT WILL GO BY  Ronald Faulkner TO PICK UP WRITTEN INSTRUCTION AS WELL.  PATIENT IS AWARE TO PICK UP PREDNISONE 50 MG AND METOPROLOL FROM PHARMACY. And for how long to [place] 07/03/17 APPOINTMENT .

## 2017-06-30 NOTE — Telephone Encounter (Signed)
Spoke to patient - patient states he has written information and prescriptions from the pharmacy. Patient does not need BMP - LABS DONE 05/27/17

## 2017-07-03 ENCOUNTER — Ambulatory Visit (HOSPITAL_COMMUNITY): Payer: Medicare HMO

## 2017-07-03 DIAGNOSIS — I200 Unstable angina: Secondary | ICD-10-CM

## 2017-07-03 HISTORY — DX: Unstable angina: I20.0

## 2017-07-07 ENCOUNTER — Ambulatory Visit: Payer: Medicare HMO | Admitting: Cardiovascular Disease

## 2017-07-07 ENCOUNTER — Encounter (INDEPENDENT_AMBULATORY_CARE_PROVIDER_SITE_OTHER): Payer: Self-pay

## 2017-07-07 ENCOUNTER — Telehealth: Payer: Self-pay | Admitting: Cardiovascular Disease

## 2017-07-07 ENCOUNTER — Encounter: Payer: Self-pay | Admitting: Cardiovascular Disease

## 2017-07-07 VITALS — BP 124/77 | HR 105 | Resp 16 | Ht 67.0 in | Wt 162.8 lb

## 2017-07-07 DIAGNOSIS — Z79899 Other long term (current) drug therapy: Secondary | ICD-10-CM

## 2017-07-07 DIAGNOSIS — I452 Bifascicular block: Secondary | ICD-10-CM

## 2017-07-07 DIAGNOSIS — Z8546 Personal history of malignant neoplasm of prostate: Secondary | ICD-10-CM | POA: Diagnosis not present

## 2017-07-07 DIAGNOSIS — G4719 Other hypersomnia: Secondary | ICD-10-CM | POA: Diagnosis not present

## 2017-07-07 DIAGNOSIS — E782 Mixed hyperlipidemia: Secondary | ICD-10-CM | POA: Diagnosis not present

## 2017-07-07 DIAGNOSIS — E119 Type 2 diabetes mellitus without complications: Secondary | ICD-10-CM | POA: Diagnosis not present

## 2017-07-07 DIAGNOSIS — R0789 Other chest pain: Secondary | ICD-10-CM | POA: Diagnosis not present

## 2017-07-07 DIAGNOSIS — R000 Tachycardia, unspecified: Secondary | ICD-10-CM | POA: Diagnosis not present

## 2017-07-07 MED ORDER — ATORVASTATIN CALCIUM 40 MG PO TABS: 40.0000 mg | ORAL_TABLET | Freq: Every day | ORAL | 5 refills | Status: DC

## 2017-07-07 MED ORDER — METOPROLOL SUCCINATE ER 25 MG PO TB24: 25.0000 mg | ORAL_TABLET | Freq: Every day | ORAL | 5 refills | Status: DC

## 2017-07-07 NOTE — Progress Notes (Signed)
Cardiology Office Note    Date:  07/12/2017   ID:  Ronald Faulkner, DOB Aug 08, 1950, MRN [id number]  PCP:  [name], DO  Cardiologist:  Shelva Majestic, MD   Chief Complaint  Patient presents with  . New Patient (Initial Visit)    chest pain   Initial evaluation with me  History of Present Illness:  Ronald Faulkner is a 67 y.o. male who has a history of hypertension, diabetes mellitus, prostate cancer, status post prostatectomy, colitis, and had been recently admitted to [facility] hospital on 05/26/2017 and discharged on 05/27/2017.  During that evaluation, he was seen by Dr. [name] for chest pain that was felt to be atypical.  His chest pain was left-sided, sharp and stabbing and lasted seconds.  There was some radiation to his back.  There are no exacerbating factors.  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  Troponins were negative.  BMP was normal at 64.  Chest x-ray was negative.  He initially was significantly hypertensive at 187/102 and type hypertensive medications were resumed.  An echo Doppler study on 05/27/2017 showed an EF of 50-55%.  There were no segmental wall motion abnormalities.  There was grade 1 diastolic dysfunction.  There was mild MR and TR.  He was scheduled to undergo an outpatient CT scan, but apparently, on January 1 the insurance changed and the CT scan was canceled since it had been set up with his old insurance.  For some reason, he was put on my schedule today for follow-up evaluation.  Presently, he denies exertional chest tightness.  He has history of diabetes mellitus since 2007.  He has a history of hypertension for 9 years but had not taken medications for at least 3 weeks prior to his hospitalization.  There is a family history for heart disease.  At times he notes that when he lifts 40 pound propane cans.  He does experience some chest wall like sensation.  He denies presyncope or syncope.  He presents for  evaluation.   Past Medical History:  Diagnosis Date  . Arthritis   . Cancer ([place])   . Colon polyps   . Diabetes mellitus without complication ([place])   . Diverticulitis   . Hypertension   . IBS (irritable bowel syndrome)     Past Surgical History:  Procedure Laterality Date  . ABDOMINAL SURGERY    . APPENDECTOMY    . BREAST SURGERY     benign lump at lt side  . COLON SURGERY    . COLONOSCOPY  03/14/2011   Procedure: COLONOSCOPY;  Surgeon: [name], MD;  [facility];  Service: Endoscopy;  Laterality: N/A;  1:00  . COLONOSCOPY N/A 11/15/2015   Procedure: COLONOSCOPY;  Surgeon: [name], MD;  Location: [facility];  Service: Endoscopy;  Laterality: N/A;  11:15  . PENILE PROSTHESIS IMPLANT     2016   . prostate cancer     2015. prostectomy    Current Medications: Outpatient Medications Prior to Visit  Medication Sig Dispense Refill  . allopurinol (ZYLOPRIM) 300 MG tablet Take 300 mg by mouth 2 (two) times daily.     Marland Kitchen aspirin EC 81 MG tablet Take 1 tablet (81 mg total) by mouth daily. 30 tablet 0  . ibuprofen (ADVIL,MOTRIN) 800 MG tablet Take 800 mg by mouth every 6 (six) hours as needed for moderate pain.     Marland Kitchen linaclotide (LINZESS) 145 MCG CAPS capsule Take  145 mcg by mouth daily before breakfast. As needed    . metFORMIN (GLUCOPHAGE) 500 MG tablet Take 1 tablet by mouth 2 (two) times daily with a meal.     . omeprazole (PRILOSEC) 20 MG capsule Take 20 mg by mouth daily.    Marland Kitchen oxymetazoline (12 HOUR NASAL SPRAY) 0.05 % nasal spray Place 1 spray into both nostrils 2 (two) times daily.     Marland Kitchen atorvastatin (LIPITOR) 40 MG tablet Take 1 tablet (40 mg total) by mouth daily at 6 PM. 30 tablet 0  . irbesartan-hydrochlorothiazide (AVALIDE) 300-12.5 MG tablet     . metoprolol tartrate (LOPRESSOR) 50 MG tablet Take 1 tablet (50 mg total) by mouth once for 1 dose. TAKE ONE HOUR PRIOR TO  SCHEDULE CARDAIC TEST (Patient not taking: Reported on 07/07/2017) 1 tablet 0   . diazepam (VALIUM) 10 MG tablet Take 1 tablet (10 mg total) by mouth every 6 (six) hours as needed (Muscle spasm). (Patient not taking: Reported on 05/26/2017) 20 tablet 0  . HYDROcodone-acetaminophen (NORCO) 5-325 MG tablet Take 1 tablet by mouth every 4 (four) hours as needed. (Patient not taking: Reported on 05/26/2017) 20 tablet 0  . losartan-hydrochlorothiazide (HYZAAR) 100-25 MG tablet Take 1 tablet by mouth daily. 30 tablet 0  . predniSONE (DELTASONE) 50 MG tablet TAKE ONE TABLET AT 10 PM  ON 07/02/17, TAKE ONE TABLET AT 7:30 AM ON 07/03/17, AND LAST TABLET 1:30 PM ON 07/03/17. TAKE 50 MG BENADRYL TABLET WITH LAST DOSE OF PREDNISONE AT 1:30 PM 3 tablet 0   No facility-administered medications prior to visit.      Allergies:   Bee venom; Fish allergy; and Ivp dye [iodinated diagnostic agents]   Social History   Socioeconomic History  . Marital status: Married    Spouse name: None  . Number of children: 2  . Years of education: None  . Highest education level: None  Social Needs  . Financial resource strain: None  . Food insecurity - worry: None  . Food insecurity - inability: None  . Transportation needs - medical: None  . Transportation needs - non-medical: None  Occupational History    Employer: [employer]  Tobacco Use  . Smoking status: Former Smoker    Packs/day: 1.00    Years: 35.00    Pack years: 35.00    Types: Cigarettes    Last attempt to quit: 06/03/1999    Years since quitting: 18.1  . Smokeless tobacco: Never Used  . Tobacco comment: quit 16 years ago  Substance and Sexual Activity  . Alcohol use: Yes    Comment: occ  . Drug use: No    Comment: hx of use in his colege days  . Sexual activity: None  Other Topics Concern  . None  Social History Narrative   Lives at home with wife and daughter and her three children.      He is married for 20 years.  He has 2 children, 7 grandchildren, and one great-grandchild.  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  He completed 2 years of college.  He drinks occasional beer, at least 4 per week.  He does not routinely exercise.  Family History:  The patient's family history includes Colon cancer in his mother; Coronary artery disease in his maternal grandmother.  His sisters age 24 and a brother is 31.  He has 2 children, ages 9 and 36 and 1 has diabetes mellitus.  ROS General: Negative; No fevers, chills, or night sweats;  HEENT: Negative; No changes in vision or hearing, sinus congestion, difficulty swallowing Pulmonary: Negative; No cough, wheezing, shortness of breath, hemoptysis Cardiovascular: Negative; No chest pain, presyncope, syncope, palpitations GI: Negative; No nausea, vomiting, diarrhea, or abdominal pain GU: Negative; No dysuria, hematuria, or difficulty voiding Musculoskeletal: Negative; no myalgias, joint pain, or weakness Hematologic/Oncology: Negative; no easy bruising, bleeding Endocrine: Negative; no heat/cold intolerance; no diabetes Neuro: Negative; no changes in balance, headaches Skin: Negative; No rashes or skin lesions Psychiatric: Negative; No behavioral problems, depression Sleep: As of her fatigue and daytime sleepiness.  I calculated an Epworth Sleepiness Scale score in the office today and this endorsed at 15.; no bruxism, restless legs, hypnogognic hallucinations, no cataplexy Other comprehensive 14 point system review is negative.   PHYSICAL EXAM:   VS:  BP 124/77   Pulse (!) 105   Resp 16   Ht 5\' 7"  (1.702 m)   Wt 162 lb 12.8 oz (73.8 kg)   SpO2 95%   BMI 25.50 kg/m     Repeat blood pressure was 118/78.  Wt Readings from Last 3 Encounters:  07/07/17 162 lb 12.8 oz (73.8 kg)  05/26/17 164 lb 12.8 oz (74.8 kg)  11/15/15 164 lb (74.4 kg)    General: Alert, oriented, no distress.  Skin: normal turgor, no rashes, warm and dry HEENT: Normocephalic, atraumatic. Pupils equal round and reactive to light; sclera  anicteric; extraocular muscles intact; Fundi arteriolar narrowing.  No hemorrhages or exudates.  Disc flat. Nose without nasal septal hypertrophy Mouth/Parynx benign; Mallinpatti scale 3 Neck: No JVD, no carotid bruits; normal carotid upstroke Lungs: clear to ausculatation and percussion; no wheezing or rales Chest wall: without tenderness to palpitation Heart: PMI not displaced, RRR, s1 s2 normal, 1/6 systolic murmur, no diastolic murmur, no rubs, gallops, thrills, or heaves Abdomen: soft, nontender; no hepatosplenomehaly, BS+; abdominal aorta nontender and not dilated by palpation. Back: no CVA tenderness Pulses 2+ Musculoskeletal: full range of motion, normal strength, no joint deformities Extremities: no clubbing cyanosis or edema, Homan's sign negative  Neurologic: grossly nonfocal; Cranial nerves grossly wnl Psychologic: Normal mood and affect   Studies/Labs Reviewed:   EKG:  EKG is ordered today.  ECG (independently read by me): Sinus tachycardia 104 bpm.  Right bundle branch block, left posterior hemiblock.  QTc interval 512 milliseconds.  PR interval 134 ms.  Recent Labs: BMP Latest Ref Rng & Units 05/27/2017 05/26/2017  Glucose 65 - 99 mg/dL 176(H) 103(H)  BUN 6 - 20 mg/dL 10 11  Creatinine 0.61 - 1.24 mg/dL 0.82 0.97  Sodium 135 - 145 mmol/L 137 142  Potassium 3.5 - 5.1 mmol/L 3.1(L) 3.5  Chloride 101 - 111 mmol/L 106 108  CO2 22 - 32 mmol/L 23 24  Calcium 8.9 - 10.3 mg/dL 8.6(L) 9.1     No flowsheet data found.  CBC Latest Ref Rng & Units 05/27/2017 05/26/2017  WBC 4.0 - 10.5 K/uL 4.9 6.1  Hemoglobin 13.0 - 17.0 g/dL 12.3(L) 12.5(L)  Hematocrit 39.0 - 52.0 % 36.9(L) 38.0(L)  Platelets 150 - 400 K/uL 242 256   Lab Results  Component Value Date   MCV 87.6 05/27/2017   MCV 88.6 05/26/2017   No results found for: TSH Lab Results  Component Value Date   HGBA1C 6.3 (H) 05/27/2017     BNP    Component Value Date/Time   BNP 64.4 05/26/2017 1447     ProBNP No results found for: PROBNP  Lipid Panel     Component Value Date/Time   CHOL 195 05/27/2017 0844   TRIG 253 (H) 05/27/2017 0844   HDL 45 05/27/2017 0844   CHOLHDL 4.3 05/27/2017 0844   VLDL 51 (H) 05/27/2017 0844   LDLCALC 99 05/27/2017 0844     RADIOLOGY: No results found.   Additional studies/ records that were reviewed today include:  I reviewed the patient's most recent hospitalization, echo Doppler study and laboratory.    ASSESSMENT:    1. Atypical chest pain   2. Mixed hyperlipidemia   3. Sinus tachycardia   4. Bifascicular bundle branch block   5. Excessive daytime sleepiness   6. Medication management   7. Personal history of prostate cancer   8. Type 2 diabetes mellitus without complication, without long-term current use of insulin [facility])      PLAN:  Mr. 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  Remotely, he had seen Dr. Percival Spanish in 2013 for chest pain.  A nuclear stress test was negative for ischemia.  A 2-D echo Doppler study showed normal ejection fraction.  The patient had not been seen by cardiology for many years but was admitted to the hospital by the medicine service with atypical chest pain.  On Christmas Day 2018.  He ruled out for myocardial infarction.  His chest pain sounded atypical for CAD, but with his cardiac vascular comorbidities.  He was scheduled to undergo a coronary CT scan.  This was scheduled but unfortunately his insurance has changed, which led to ultimate denial and need for rescheduled.  As result, this CT scan has not yet been done.  Presently, his blood pressure is controlled on irbesartan/HCTZ 300/12.5. ,  However, he has a resting tachycardia with a pulse of 105 in the office today.  I am adding Toprol-XL 25 mg daily for his tachycardia which also be  helpful to reduce the likelihood for labile hypertension.  He has bifascicular block noted on ECG.  He previously was on atorvastatin, but has not been taking this.  I have recommended resumption of his 40 mg prior dosing.  LDL cholesterol in December 2018 was 99.  His triglycerides and VLDL were elevated consistent with a mixed hyperlipidemic atherogenic dyslipidemic profile.  Typically this has been associated with small dense LDL sub-particles.  TSH in March 2018 was 2.2.  I am rechecking laboratory prior to his CT scan.  I will also recheck fasting lipid panel in 2 months.  I discussed the importance of exercise.  He does have excessive daytime sleepiness.  I discussed the importance of adequate sleep duration.  A future evaluation for sleep apnea may be indicated.  He is diabetic on metformin.  Most recent hemoglobin A1c was 6.3.  I will contact him regarding the results of his CT scan when available and see him back in the office for follow-up evaluation.   Medication Adjustments/Labs and Tests Ordered: Current medicines are reviewed at length with the patient today.  Concerns regarding medicines are outlined above.  Medication changes, Labs and Tests ordered today are listed in the Patient Instructions below. Patient Instructions  Medication Instructions:  START metoprolol succinate 25 mg daily  --the day of the CT, do NOT take metoprolol succinate, take metoprolol tartrate (Lopressor) 50 mg one hour prior to CT.  Take Prednisone and Benadryl as instructed  Labwork: 1  week prior to CT-BMET lab order provided.  Please return for FASTING labs in 2 months (CMET, Lipid)  Testing/Procedures: Reschedule cardiac CTA with new insurance-refer to instruction sheet that was given.  Follow-Up: After testing with Dr. Claiborne Billings   Any Other Special Instructions Will Be Listed Below (If Applicable).     If you need a refill on your cardiac medications before your next appointment, please call your  pharmacy.      Signed, Shelva Majestic, MD  07/12/2017 11:42 AM    [facility] [address], [place], [place], [place]  [address] Phone: [phone]

## 2017-07-07 NOTE — Telephone Encounter (Signed)
Shaniece calling with Holland Falling at [facility] with important info regarding patient

## 2017-07-07 NOTE — Patient Instructions (Addendum)
Medication Instructions:  START metoprolol succinate 25 mg daily  --the day of the CT, do NOT take metoprolol succinate, take metoprolol tartrate (Lopressor) 50 mg one hour prior to CT.  Take Prednisone and Benadryl as instructed  Labwork: 1 week prior to CT-BMET lab order provided.  Please return for FASTING labs in 2 months (CMET, Lipid)  Testing/Procedures: Reschedule cardiac CTA with new insurance-refer to instruction sheet that was given.  Follow-Up: After testing with Dr. Claiborne Billings   Any Other Special Instructions Will Be Listed Below (If Applicable).     If you need a refill on your cardiac medications before your next appointment, please call your pharmacy.

## 2017-07-07 NOTE — Telephone Encounter (Signed)
Attempted to return call. There was no extension for Ronald Faulkner available. Patient has MD OV today @ 2:40pm

## 2017-07-12 ENCOUNTER — Encounter: Payer: Self-pay | Admitting: Cardiovascular Disease

## 2017-07-23 ENCOUNTER — Telehealth: Payer: Self-pay | Admitting: Cardiovascular Disease

## 2017-07-23 NOTE — Telephone Encounter (Signed)
Spoke to [place] in precert-will call to speak to patient in regards.

## 2017-07-23 NOTE — Telephone Encounter (Signed)
I called Ronald Faulkner and advised him that his cardiac cta was approved previously but unfortunately his new insurance has denied the test.    I have started the appeal process on the cardiac ct denial and advised him it can take up to 45 days to receive a decision from the insurance company.  He stated he was still having chest pain and did not want to wait on insurance any longer because he is afraid.  I assured him I would put in a phone note and route to the nurses so we could get some advice on this.   He thanked me for the return phone call and reassurance.

## 2017-07-23 NOTE — Telephone Encounter (Signed)
Returned call to patient,  Patient reports he started straightening up his house and started having chest pain, got hot , lightheaded, nauseous, SOB, pain radiating into his back.  Currently 6/10. States the pain resolves with rest, only occurs when he gets up to do something.   States he takes his BP daily and this has been good, did not take this AM.   Patient is concerns and is worried something is wrong with his heart and wants to get the cardiac CT completed.    Advised if he is actively having symptoms he needs to proceed to ER for evaluation.   CT is in the process of precert (see previous telephone note) and this could take a while to get this done.  If he is having symptoms he needs to be evaluated for acute issues.    Patient aware and verbalized understanding.

## 2017-07-23 NOTE — Telephone Encounter (Signed)
Patient calling in reference to CT Coronary  scan, states that his new insurance denied coverage for the test. Insurance letter states that he did not have three qualifying conditions. Patient states that he needs test and does not know what he should do.  Patient states that he did contact insurance.

## 2017-07-29 ENCOUNTER — Other Ambulatory Visit: Payer: Self-pay

## 2017-07-29 ENCOUNTER — Emergency Department (HOSPITAL_COMMUNITY): Payer: Medicare HMO

## 2017-07-29 ENCOUNTER — Inpatient Hospital Stay (HOSPITAL_COMMUNITY)
Admission: EM | Admit: 2017-07-29 | Discharge: 2017-08-01 | DRG: 247 | Disposition: A | Discharge status: Home / Self Care | Payer: Medicare HMO | Attending: Internal Medicine | Admitting: Internal Medicine

## 2017-07-29 ENCOUNTER — Encounter (HOSPITAL_COMMUNITY): Payer: Self-pay | Admitting: Emergency Medicine

## 2017-07-29 DIAGNOSIS — I10 Essential (primary) hypertension: Secondary | ICD-10-CM | POA: Diagnosis present

## 2017-07-29 DIAGNOSIS — Z86010 Personal history of colonic polyps: Secondary | ICD-10-CM | POA: Diagnosis not present

## 2017-07-29 DIAGNOSIS — E876 Hypokalemia: Secondary | ICD-10-CM | POA: Diagnosis present

## 2017-07-29 DIAGNOSIS — I25110 Atherosclerotic heart disease of native coronary artery with unstable angina pectoris: Secondary | ICD-10-CM | POA: Diagnosis present

## 2017-07-29 DIAGNOSIS — Z91013 Allergy to seafood: Secondary | ICD-10-CM

## 2017-07-29 DIAGNOSIS — Z9079 Acquired absence of other genital organ(s): Secondary | ICD-10-CM | POA: Diagnosis not present

## 2017-07-29 DIAGNOSIS — R0602 Shortness of breath: Secondary | ICD-10-CM | POA: Diagnosis not present

## 2017-07-29 DIAGNOSIS — Z91041 Radiographic dye allergy status: Secondary | ICD-10-CM | POA: Diagnosis not present

## 2017-07-29 DIAGNOSIS — I209 Angina pectoris, unspecified: Secondary | ICD-10-CM

## 2017-07-29 DIAGNOSIS — Z91030 Bee allergy status: Secondary | ICD-10-CM | POA: Diagnosis not present

## 2017-07-29 DIAGNOSIS — Z8249 Family history of ischemic heart disease and other diseases of the circulatory system: Secondary | ICD-10-CM | POA: Diagnosis not present

## 2017-07-29 DIAGNOSIS — Z960 Presence of urogenital implants: Secondary | ICD-10-CM | POA: Diagnosis present

## 2017-07-29 DIAGNOSIS — Z800 Family history of malignant neoplasm of digestive organs: Secondary | ICD-10-CM | POA: Diagnosis not present

## 2017-07-29 DIAGNOSIS — M1A00X0 Idiopathic chronic gout, unspecified site, without tophus (tophi): Secondary | ICD-10-CM | POA: Diagnosis present

## 2017-07-29 DIAGNOSIS — E785 Hyperlipidemia, unspecified: Secondary | ICD-10-CM | POA: Diagnosis present

## 2017-07-29 DIAGNOSIS — I452 Bifascicular block: Secondary | ICD-10-CM | POA: Diagnosis present

## 2017-07-29 DIAGNOSIS — Z955 Presence of coronary angioplasty implant and graft: Secondary | ICD-10-CM

## 2017-07-29 DIAGNOSIS — K589 Irritable bowel syndrome without diarrhea: Secondary | ICD-10-CM | POA: Diagnosis present

## 2017-07-29 DIAGNOSIS — Z8546 Personal history of malignant neoplasm of prostate: Secondary | ICD-10-CM | POA: Diagnosis not present

## 2017-07-29 DIAGNOSIS — E119 Type 2 diabetes mellitus without complications: Secondary | ICD-10-CM

## 2017-07-29 DIAGNOSIS — K219 Gastro-esophageal reflux disease without esophagitis: Secondary | ICD-10-CM | POA: Diagnosis present

## 2017-07-29 DIAGNOSIS — Z9049 Acquired absence of other specified parts of digestive tract: Secondary | ICD-10-CM | POA: Diagnosis not present

## 2017-07-29 DIAGNOSIS — Z7982 Long term (current) use of aspirin: Secondary | ICD-10-CM | POA: Diagnosis not present

## 2017-07-29 DIAGNOSIS — I200 Unstable angina: Secondary | ICD-10-CM | POA: Diagnosis present

## 2017-07-29 DIAGNOSIS — Z7984 Long term (current) use of oral hypoglycemic drugs: Secondary | ICD-10-CM

## 2017-07-29 DIAGNOSIS — Z87891 Personal history of nicotine dependence: Secondary | ICD-10-CM | POA: Diagnosis not present

## 2017-07-29 DIAGNOSIS — R079 Chest pain, unspecified: Secondary | ICD-10-CM | POA: Diagnosis not present

## 2017-07-29 DIAGNOSIS — R0609 Other forms of dyspnea: Secondary | ICD-10-CM | POA: Diagnosis present

## 2017-07-29 DIAGNOSIS — Z79899 Other long term (current) drug therapy: Secondary | ICD-10-CM | POA: Diagnosis not present

## 2017-07-29 HISTORY — DX: Bifascicular block: I45.2

## 2017-07-29 HISTORY — DX: Unstable angina: I20.0

## 2017-07-29 LAB — BASIC METABOLIC PANEL
Anion gap: 13 (ref 5–15)
BUN: 18 mg/dL (ref 6–20)
CHLORIDE: 104 mmol/L (ref 101–111)
CO2: 21 mmol/L — AB (ref 22–32)
CREATININE: 1.05 mg/dL (ref 0.61–1.24)
Calcium: 9.6 mg/dL (ref 8.9–10.3)
GFR calc non Af Amer: >60 mL/min (ref >60)
Glucose, Bld: 188 mg/dL — ABNORMAL HIGH (ref 65–99)
Potassium: 3.9 mmol/L (ref 3.5–5.1)
Sodium: 138 mmol/L (ref 135–145)

## 2017-07-29 LAB — PROTIME-INR
INR: 0.95
PROTHROMBIN TIME: 12.6 s (ref 11.4–15.2)

## 2017-07-29 LAB — CBC
HEMATOCRIT: 41.8 % (ref 39.0–52.0)
Hemoglobin: 13.2 g/dL (ref 13.0–17.0)
MCH: 27.4 pg (ref 26.0–34.0)
MCHC: 31.6 g/dL (ref 30.0–36.0)
MCV: 86.9 fL (ref 78.0–100.0)
PLATELETS: 262 10*3/uL (ref 150–400)
RBC: 4.81 MIL/uL (ref 4.22–5.81)
RDW: 14.5 % (ref 11.5–15.5)
WBC: 6.3 10*3/uL (ref 4.0–10.5)

## 2017-07-29 LAB — APTT: APTT: 33 s (ref 24–36)

## 2017-07-29 LAB — TROPONIN I
Troponin I: <0.03 ng/mL (ref <0.03)
Troponin I: <0.03 ng/mL (ref <0.03)

## 2017-07-29 MED ORDER — INSULIN ASPART 100 UNIT/ML ~~LOC~~ SOLN
0.0000 [IU] | Freq: Three times a day (TID) | SUBCUTANEOUS | Status: DC
  Administered 2017-07-30 (×2): 2 [IU] via SUBCUTANEOUS
  Administered 2017-07-31: 1 [IU] via SUBCUTANEOUS
  Administered 2017-07-31: 19:00:00 3 [IU] via SUBCUTANEOUS
  Administered 2017-07-31: 2 [IU] via SUBCUTANEOUS
  Administered 2017-08-01: 07:00:00 1 [IU] via SUBCUTANEOUS
  Filled 2017-07-29: qty 1

## 2017-07-29 MED ORDER — ASPIRIN EC 81 MG PO TBEC: 81.0000 mg | DELAYED_RELEASE_TABLET | Freq: Every day | ORAL | Status: DC

## 2017-07-29 MED ORDER — POLYETHYLENE GLYCOL 3350 17 G PO PACK: 17.0000 g | PACK | Freq: Every day | ORAL | Status: DC | PRN

## 2017-07-29 MED ORDER — HEPARIN (PORCINE) IN NACL 100-0.45 UNIT/ML-% IJ SOLN
1000.0000 [IU]/h | INTRAMUSCULAR | Status: DC
  Administered 2017-07-30: 1000 [IU]/h via INTRAVENOUS
  Filled 2017-07-29 (×2): qty 250

## 2017-07-29 MED ORDER — ASPIRIN EC 81 MG PO TBEC
81.0000 mg | DELAYED_RELEASE_TABLET | Freq: Every day | ORAL | Status: DC
  Administered 2017-07-30 – 2017-08-01 (×3): 81 mg via ORAL
  Filled 2017-07-29 (×4): qty 1

## 2017-07-29 MED ORDER — HEPARIN BOLUS VIA INFUSION
4000.0000 [IU] | Freq: Once | INTRAVENOUS | Status: AC
  Administered 2017-07-29: 4000 [IU] via INTRAVENOUS

## 2017-07-29 MED ORDER — ONDANSETRON HCL 4 MG PO TABS: 4.0000 mg | ORAL_TABLET | Freq: Four times a day (QID) | ORAL | Status: DC | PRN

## 2017-07-29 MED ORDER — ACETAMINOPHEN 650 MG RE SUPP: 650.0000 mg | Freq: Four times a day (QID) | RECTAL | Status: DC | PRN

## 2017-07-29 MED ORDER — ONDANSETRON HCL 4 MG/2ML IJ SOLN: 4.0000 mg | Freq: Four times a day (QID) | INTRAMUSCULAR | Status: DC | PRN

## 2017-07-29 MED ORDER — ASPIRIN 81 MG PO CHEW
324.0000 mg | CHEWABLE_TABLET | Freq: Once | ORAL | Status: AC
  Administered 2017-07-29: 324 mg via ORAL
  Filled 2017-07-29: qty 4

## 2017-07-29 MED ORDER — ATORVASTATIN CALCIUM 40 MG PO TABS
40.0000 mg | ORAL_TABLET | Freq: Every day | ORAL | Status: DC
  Administered 2017-07-29 – 2017-07-31 (×3): 40 mg via ORAL
  Filled 2017-07-29 (×4): qty 1

## 2017-07-29 MED ORDER — POTASSIUM CHLORIDE IN NACL 20-0.9 MEQ/L-% IV SOLN
INTRAVENOUS | Status: DC
  Administered 2017-07-29 – 2017-07-31 (×4): via INTRAVENOUS
  Filled 2017-07-29 (×6): qty 1000

## 2017-07-29 MED ORDER — TRAMADOL HCL 50 MG PO TABS: 50.0000 mg | ORAL_TABLET | Freq: Four times a day (QID) | ORAL | Status: DC | PRN

## 2017-07-29 MED ORDER — ALLOPURINOL 300 MG PO TABS
300.0000 mg | ORAL_TABLET | Freq: Two times a day (BID) | ORAL | Status: DC
  Administered 2017-07-29 – 2017-08-01 (×5): 300 mg via ORAL
  Filled 2017-07-29 (×8): qty 1

## 2017-07-29 MED ORDER — NITROGLYCERIN 0.4 MG SL SUBL
0.4000 mg | SUBLINGUAL_TABLET | SUBLINGUAL | Status: DC | PRN
  Filled 2017-07-29: qty 1

## 2017-07-29 MED ORDER — ASPIRIN 81 MG PO CHEW: 324.0000 mg | CHEWABLE_TABLET | ORAL | Status: DC

## 2017-07-29 MED ORDER — IRBESARTAN-HYDROCHLOROTHIAZIDE 300-12.5 MG PO TABS: 1.0000 | ORAL_TABLET | Freq: Every day | ORAL | Status: DC

## 2017-07-29 MED ORDER — LINACLOTIDE 145 MCG PO CAPS
145.0000 ug | ORAL_CAPSULE | Freq: Every day | ORAL | Status: DC | PRN
  Filled 2017-07-29: qty 1

## 2017-07-29 MED ORDER — ASPIRIN 300 MG RE SUPP: 300.0000 mg | RECTAL | Status: DC

## 2017-07-29 MED ORDER — SODIUM CHLORIDE 0.9% FLUSH
3.0000 mL | Freq: Two times a day (BID) | INTRAVENOUS | Status: DC
  Administered 2017-07-29 – 2017-07-31 (×4): 3 mL via INTRAVENOUS

## 2017-07-29 MED ORDER — MORPHINE SULFATE (PF) 2 MG/ML IV SOLN: 2.0000 mg | INTRAVENOUS | Status: DC | PRN

## 2017-07-29 MED ORDER — METOPROLOL SUCCINATE ER 25 MG PO TB24
25.0000 mg | ORAL_TABLET | Freq: Every day | ORAL | Status: DC
  Administered 2017-07-30 – 2017-08-01 (×3): 25 mg via ORAL
  Filled 2017-07-29 (×7): qty 1

## 2017-07-29 MED ORDER — ACETAMINOPHEN 325 MG PO TABS
650.0000 mg | ORAL_TABLET | Freq: Four times a day (QID) | ORAL | Status: DC | PRN
  Administered 2017-07-30: 650 mg via ORAL
  Filled 2017-07-29: qty 2

## 2017-07-29 MED ORDER — METFORMIN HCL 500 MG PO TABS: 500.0000 mg | ORAL_TABLET | Freq: Two times a day (BID) | ORAL | Status: DC

## 2017-07-29 MED ORDER — NITROGLYCERIN 2 % TD OINT
1.0000 [in_us] | TOPICAL_OINTMENT | Freq: Four times a day (QID) | TRANSDERMAL | Status: DC
  Administered 2017-07-30 – 2017-07-31 (×4): 1 [in_us] via TOPICAL
  Filled 2017-07-29: qty 30
  Filled 2017-07-29: qty 1

## 2017-07-29 MED ORDER — PANTOPRAZOLE SODIUM 40 MG PO TBEC
40.0000 mg | DELAYED_RELEASE_TABLET | Freq: Every day | ORAL | Status: DC
  Administered 2017-07-30 – 2017-08-01 (×3): 40 mg via ORAL
  Filled 2017-07-29 (×3): qty 1

## 2017-07-29 NOTE — Progress Notes (Signed)
ANTICOAGULATION CONSULT NOTE - Initial Consult  Pharmacy Consult for Heparin Indication: chest pain/ACS  Allergies  Allergen Reactions  . Bee Venom   . Fish Allergy Hives and Swelling  . Ivp Dye [Iodinated Diagnostic Agents] Swelling   Patient Measurements: Height: 5\' 7"  (170.2 cm) Weight: 164 lb (74.4 kg) IBW/kg (Calculated) : 66.1 HEPARIN DW (KG): 74.4   Vital Signs: Temp: 97.8 F (36.6 C) (02/27 1421) Temp Source: Oral (02/27 1421) BP: 128/78 (02/27 1700) Pulse Rate: 79 (02/27 1700)  Labs: Recent Labs    07/29/17 1445  HGB 13.2  HCT 41.8  PLT 262  CREATININE 1.05  TROPONINI <0.03    Estimated Creatinine Clearance: 63.8 mL/min (by C-G formula based on SCr of 1.05 mg/dL).   Medical History: Past Medical History:  Diagnosis Date  . Arthritis   . Cancer ([place])   . Colon polyps   . Diabetes mellitus without complication ([place])   . Diverticulitis   . Hypertension   . IBS (irritable bowel syndrome)   . 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  Not currently taking oral anticoagulant.  Assessment: Okay for Protocol, baseline anticoag labs pending.  Likely Tx to [facility] for further cardiac work-up.  Goal of Therapy:  Heparin level 0.3-0.7 units/ml Monitor platelets by anticoagulation protocol: Yes   Plan:  Give 4000 units bolus x 1 Start heparin infusion at 850 units/hr Check anti-Xa level in 6-8 hours and daily while on heparin Continue to monitor H&H and platelets  Pricilla Larsson 07/29/2017,7:13 PM

## 2017-07-29 NOTE — H&P (Signed)
History and Physical    Ronald Faulkner EVO:[id number] DOB: [birth year] DOA: 07/29/2017  PCP: [name], DO   Patient coming from: Home  I have personally briefly reviewed patient's old medical records in [place]  Chief Complaint: Chest pain progressively worse for 3 months  HPI: Ronald Faulkner is a 67 y.o. male with medical history significant of diabetes, hypertension, hyperlipidemia, former smoker who presents with chest pains.  The patient had admission to our facility in December where he underwent an echocardiogram which was unremarkable.  He ruled out for myocardial infarction and was referred to cardiology for outpatient evaluation.  He saw Dr. Marlou Porch who wanted to perform a coronary CT scan.  His insurance has been giving him all kinds of trouble in terms of approval.  They have been denying the coronary CT scan and the patient has had to submit multiple requests.  Since that time however his chest pain has been progressing and it has been increasing in frequency and severity.  Today the patient had an episode at rest and he had an episode in the car.  Presented to the emergency department with his chest pain and also complained of feeling lightheaded and nauseated.  Patient describes his pain is sharp it goes right through from his chest to his back it is associated with nausea and lightheadedness, the pain is intermittent however the frequency has been increasing.  He does not have any left arm numbness or tingling he denies throat pain neck pain or vomiting.     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  This makes it rather impossible to determine if he is having any acute changes.  On my evaluation in the emergency department the patient was resting comfortably and having no difficulties however when I asked him to sit  up to listen to his lungs the simple act of reaching forward and pulling himself up in the stretcher caused him to have chest pain which required 3 minutes of rest to recover from.  Given this episode we rechecked an EKG which of course was difficult to interpret and I started the patient on heparin.  He is also started on nitroglycerin paste.  I am extremely concerned about this patient and have a very high suspicion for real coronary disease therefore the patient will be admitted to the stepdown unit on heparin pending cardiac catheterization in the a.m.  Dr. Thurnell Garbe from the emergency department spoke with Dr. Illene Bolus who is Dr. Marlou Porch partner and is in agreement with the plan.  Review of Systems: As per HPI otherwise complete review of systems negative.    Past Medical History:  Diagnosis Date  . Arthritis   . Cancer ([place])   . Colon polyps   . Diabetes mellitus without complication ([place])   . Diverticulitis   . Hypertension   . IBS (irritable bowel syndrome)   . 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 ABDOMINAL SURGERY    . APPENDECTOMY    . BREAST SURGERY     benign lump at lt side  . COLON SURGERY    . COLONOSCOPY  03/14/2011   Procedure: COLONOSCOPY;  Surgeon: [name], MD;  [facility];  Service: Endoscopy;  Laterality: N/A;  1:00  . COLONOSCOPY N/A 11/15/2015   Procedure: COLONOSCOPY;  Surgeon: [name], MD;  [facility];  Service: Endoscopy;  Laterality: N/A;  11:15  . PENILE PROSTHESIS IMPLANT     2016   . prostate cancer     2015. prostectomy     reports that he quit smoking about 18 years ago. His smoking use included cigarettes. He has a 35.00 pack-year smoking history. he has never used smokeless tobacco. He reports that he drinks alcohol. He reports that he does not use drugs.   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  He toured the entire [place] with many very famous Pharmacist, hospital singers when he was a young man  Allergies  Allergen Reactions  . Bee Venom   . Fish Allergy Hives and Swelling  . Ivp Dye [Iodinated Diagnostic Agents] Swelling    Family History  Problem Relation Age of Onset  . Colon cancer Mother   . Coronary artery disease Maternal Grandmother   . Anesthesia problems Neg Hx   . Hypotension Neg Hx   . Malignant hyperthermia Neg Hx   . Pseudochol deficiency Neg Hx     Prior to Admission medications   Medication Sig Start Date End Date Taking? Authorizing Provider  allopurinol (ZYLOPRIM) 300 MG tablet Take 300 mg by mouth 2 (two) times daily.  11/10/11  Yes [provider]  aspirin EC 81 MG tablet Take 1 tablet (81 mg total) by mouth daily. 05/27/17 05/27/18 Yes Sheikh, Omair Latif, DO  atorvastatin (LIPITOR) 40 MG tablet Take 1 tablet (40 mg total) by mouth daily at 6 PM. 07/07/17  Yes Troy Sine, MD  ibuprofen (ADVIL,MOTRIN) 800 MG tablet Take 800 mg by mouth every 6 (six) hours as needed for moderate pain.    Yes [provider]  irbesartan-hydrochlorothiazide (AVALIDE) 300-12.5 MG tablet Take 1 tablet by mouth daily.  07/05/17  Yes [provider]  linaclotide (LINZESS) 145 MCG CAPS capsule Take 145 mcg by mouth daily as needed (for constipation). As needed   Yes [provider]  metFORMIN (GLUCOPHAGE) 500 MG tablet Take 1 tablet by mouth 2 (two) times daily with a meal.  05/21/14  Yes [provider]  metoprolol succinate (TOPROL XL) 25 MG 24 hr tablet Take 1 tablet (25 mg total) by mouth daily. 07/07/17  Yes Troy Sine, MD  omeprazole (PRILOSEC) 20 MG capsule Take 20 mg by mouth daily. 03/27/17  Yes [provider]  oxymetazoline (12 HOUR NASAL SPRAY) 0.05 % nasal spray Place 1 spray into both nostrils 2 (two) times daily.    Yes [provider]  metoprolol tartrate (LOPRESSOR) 50 MG tablet Take 1 tablet (50  mg total) by mouth once for 1 dose. TAKE ONE HOUR PRIOR TO  SCHEDULE CARDAIC TEST Patient not taking: Reported on 07/07/2017 06/29/17 06/29/17  Consuelo Pandy, PA-C    Physical Exam: Vitals:   07/29/17 1421 07/29/17 1422 07/29/17 1700 07/29/17 1938  BP: 137/89  128/78 (!) 142/96  Pulse: 87  79 82  Resp: 18  17 14   Temp: 97.8 F (36.6 C)     TempSrc: Oral     SpO2: 100%  97% 98%  Weight:  74.4 kg (164 lb)    Height:  5\' 7"  (1.702 m)     .TCS Constitutional: NAD, calm, comfortable Vitals:   07/29/17 1421 07/29/17 1422 07/29/17 1700 07/29/17 1938  BP: 137/89  128/78 Marland Kitchen)  142/96  Pulse: 87  79 82  Resp: 18  17 14   Temp: 97.8 F (36.6 C)     TempSrc: Oral     SpO2: 100%  97% 98%  Weight:  74.4 kg (164 lb)    Height:  5\' 7"  (1.702 m)     Eyes: PERRL, lids and conjunctivae normal ENMT: Mucous membranes are moist. Posterior pharynx clear of any exudate or lesions.Normal dentition.  Neck: normal, supple, no masses, no thyromegaly Respiratory: clear to auscultation bilaterally, no wheezing, no crackles. Normal respiratory effort. No accessory muscle use.  Cardiovascular: Regular rate and rhythm, no murmurs / rubs / gallops. No extremity edema. 2+ pedal pulses. No carotid bruits.  Abdomen: no tenderness, no masses palpated. No hepatosplenomegaly. Bowel sounds positive.  Musculoskeletal: no clubbing / cyanosis. No joint deformity upper and lower extremities. Good ROM, no contractures. Normal muscle tone.  Skin: no rashes, lesions, ulcers. No induration Neurologic: CN 2-12 grossly intact. Sensation intact, DTR normal. Strength 5/5 in all 4.  Psychiatric: Normal judgment and insight. Alert and oriented x 3. Normal mood.    Labs on Admission: I have personally reviewed following labs and imaging studies  CBC: Recent Labs  Lab 07/29/17 1445  WBC 6.3  HGB 13.2  HCT 41.8  MCV 86.9  PLT 833   Basic Metabolic Panel: Recent Labs  Lab 07/29/17 1445  NA 138  K 3.9  CL 104  CO2  21*  GLUCOSE 188*  BUN 18  CREATININE 1.05  CALCIUM 9.6   GFR: Estimated Creatinine Clearance: 63.8 mL/min (by C-G formula based on SCr of 1.05 mg/dL). Liver Function Tests: No results for input(s): AST, ALT, ALKPHOS, BILITOT, PROT, ALBUMIN in the last 168 hours. No results for input(s): LIPASE, AMYLASE in the last 168 hours. No results for input(s): AMMONIA in the last 168 hours. Coagulation Profile: Recent Labs  Lab 07/29/17 1908  INR 0.95   Cardiac Enzymes: Recent Labs  Lab 07/29/17 1445 07/29/17 1943  TROPONINI <0.03 <0.03   BNP (last 3 results) No results for input(s): PROBNP in the last 8760 hours. HbA1C: No results for input(s): HGBA1C in the last 72 hours. CBG: No results for input(s): GLUCAP in the last 168 hours. Lipid Profile: No results for input(s): CHOL, HDL, LDLCALC, TRIG, CHOLHDL, LDLDIRECT in the last 72 hours. Thyroid Function Tests: No results for input(s): TSH, T4TOTAL, FREET4, T3FREE, THYROIDAB in the last 72 hours. Anemia Panel: No results for input(s): VITAMINB12, FOLATE, FERRITIN, TIBC, IRON, RETICCTPCT in the last 72 hours. Urine analysis: No results found for: COLORURINE, APPEARANCEUR, LABSPEC, PHURINE, GLUCOSEU, HGBUR, BILIRUBINUR, KETONESUR, PROTEINUR, UROBILINOGEN, NITRITE, LEUKOCYTESUR  Radiological Exams on Admission: Dg Chest 2 View  Result Date: 07/29/2017 CLINICAL DATA:  Mid to left-sided chest pain intermittent for 2 months. History of diabetes and hypertension. EXAM: CHEST  2 VIEW COMPARISON:  Radiographs 05/26/2017. FINDINGS: The heart size and mediastinal contours are stable. There are linear opacities at both lung bases, similar to previous study, and most consistent with scarring. There is no edema, confluent airspace opacity, pleural effusion or pneumothorax. Mild degenerative changes are present throughout the thoracic spine. IMPRESSION: Stable chest with bibasilar scarring. No acute cardiopulmonary process. Electronically Signed    By: Richardean Sale M.D.   On: 07/29/2017 17:47    EKG: Independently reviewed.  Both EKGs obtained in the emergency department revealed normal sinus rhythm with a right bundle branch block and a left anterior fascicular block  Assessment/Plan Principal Problem:   Unstable angina ([place]) Active  Problems:   HTN (hypertension)   SOB (shortness of breath)   Type 2 diabetes mellitus (HCC)   GERD (gastroesophageal reflux disease)  1.  Unstable angina: I believe the patient has an acute coronary syndrome.  Given his impressive symptoms and concerning presentation in the emergency department as well as his episode of chest pain witnessed by me on going to start him on a heparin drip and transfer him to a stepdown unit at [facility] in [place].  I have spoken to Dr. Thurnell Garbe who communicated with Dr. Lenna Sciara very nasty from cardiology.  Will for the patient to have a cardiac catheterization in the morning.  He is n.p.o. after midnight.  2.  Hypertension: Blood pressures are currently stable we will continue home medications to include irbesartan hydrochlorothiazide and metoprolol  3.  Shortness of breath: This is a symptom associated with his unstable angina hopefully will improve once we treat his underlying disease.  4.  Type 2 diabetes mellitus: We will order fingerstick blood glucoses hold his metformin and monitor sugars on the protocol.  5.  Gastroesophageal reflux disease: Continue omeprazole.  DVT prophylaxis: On a heparin drip Code Status: Full code Family Communication: Wife present at bedside Disposition Plan: Likely home in 3 days Consults called: [place] with cardiology Admission status: Inpatient   Lady Deutscher MD [place] [facility] Pager [phone]  If 7PM-7AM, please contact night-coverage [email or web address] Password [facility]  07/29/2017, 9:59 PM

## 2017-07-29 NOTE — ED Triage Notes (Signed)
Chest pain since December. Pt seen by Cardiologist beginning of February for same. Pt states needs a test but insurance is not cooperating.

## 2017-07-29 NOTE — ED Provider Notes (Signed)
[facility] EMERGENCY DEPARTMENT Provider Note   CSN: [id number] Arrival date & time: 07/29/17  1411     History   Chief Complaint Chief Complaint  Patient presents with  . Chest Pain    HPI Ronald Faulkner is a 67 y.o. male.  HPI  Pt was seen at 1700.  Per pt, c/o gradual onset and worsening of multiple intermittent episodes of chest "pain" for the past 3 months. Pt describes the left chest pain as "aching," with occasional radiation into his left neck. Has been associated with SOB, lightheadedness, nausea, and diaphoresis. Symptoms have been occurring with exertion (ie: straightening up the house), and improving with resting; lasting approximately 20 minutes per episode. Pt states now the CP has been coming on occasionally at rest, lasting 5 minutes each episode before resolving. Pt has hx myoview 2013 and CP admission 05/2017. Pt was due to have cardiac CT, but has been denied by insurance. Denies palpitations, no cough, no abd pain, no vomiting/diarrhea, no rash, no fevers, no back pain.    Past Medical History:  Diagnosis Date  . Arthritis   . Cancer ([place])   . Colon polyps   . Diabetes mellitus without complication ([place])   . Diverticulitis   . Hypertension   . IBS (irritable bowel syndrome)   . 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 Type 2 diabetes mellitus ([place]) 05/27/2017  . Bilateral lower extremity edema   . SOB (shortness of breath)   . Family hx of colon cancer 09/18/2015  . Gout 09/18/2015  . Rectal bleeding 04/05/2012  . GERD (gastroesophageal reflux disease) 04/05/2012  . Atypical chest pain 12/08/2011  . Abnormal EKG 12/08/2011  . HTN (hypertension) 12/08/2011    Past Surgical History:  Procedure Laterality Date  . ABDOMINAL SURGERY    . APPENDECTOMY    . BREAST SURGERY     benign lump at lt side  . COLON SURGERY    . COLONOSCOPY  03/14/2011   Procedure:  COLONOSCOPY;  Surgeon: [name], MD;  [facility];  Service: Endoscopy;  Laterality: N/A;  1:00  . COLONOSCOPY N/A 11/15/2015   Procedure: COLONOSCOPY;  Surgeon: [name], MD;  [facility];  Service: Endoscopy;  Laterality: N/A;  11:15  . PENILE PROSTHESIS IMPLANT     2016   . prostate cancer     2015. prostectomy       Home Medications    Prior to Admission medications   Medication Sig Start Date End Date Taking? Authorizing Provider  allopurinol (ZYLOPRIM) 300 MG tablet Take 300 mg by mouth 2 (two) times daily.  11/10/11  Yes [provider]  aspirin EC 81 MG tablet Take 1 tablet (81 mg total) by mouth daily. 05/27/17 05/27/18 Yes Sheikh, Omair Latif, DO  atorvastatin (LIPITOR) 40 MG tablet Take 1 tablet (40 mg total) by mouth daily at 6 PM. 07/07/17  Yes Troy Sine, MD  ibuprofen (ADVIL,MOTRIN) 800 MG tablet Take 800 mg by mouth every 6 (six) hours as needed for moderate pain.    Yes [provider]  irbesartan-hydrochlorothiazide (AVALIDE) 300-12.5 MG tablet Take 1 tablet by mouth daily.  07/05/17  Yes [provider]  linaclotide (LINZESS) 145 MCG CAPS capsule Take 145 mcg by mouth daily as needed (for constipation). As needed   Yes [provider]  metFORMIN (GLUCOPHAGE) 500 MG  tablet Take 1 tablet by mouth 2 (two) times daily with a meal.  05/21/14  Yes [provider]  metoprolol succinate (TOPROL XL) 25 MG 24 hr tablet Take 1 tablet (25 mg total) by mouth daily. 07/07/17  Yes Troy Sine, MD  omeprazole (PRILOSEC) 20 MG capsule Take 20 mg by mouth daily. 03/27/17  Yes [provider]  oxymetazoline (12 HOUR NASAL SPRAY) 0.05 % nasal spray Place 1 spray into both nostrils 2 (two) times daily.    Yes [provider]  metoprolol tartrate (LOPRESSOR) 50 MG tablet Take 1 tablet (50 mg total) by mouth once for 1 dose. TAKE ONE HOUR PRIOR TO  SCHEDULE CARDAIC TEST Patient not taking:  Reported on 07/07/2017 06/29/17 06/29/17  Consuelo Pandy, PA-C    Family History Family History  Problem Relation Age of Onset  . Colon cancer Mother   . Coronary artery disease Maternal Grandmother   . Anesthesia problems Neg Hx   . Hypotension Neg Hx   . Malignant hyperthermia Neg Hx   . Pseudochol deficiency Neg Hx     Social History Social History   Tobacco Use  . Smoking status: Former Smoker    Packs/day: 1.00    Years: 35.00    Pack years: 35.00    Types: Cigarettes    Last attempt to quit: 06/03/1999    Years since quitting: 18.1  . Smokeless tobacco: Never Used  . Tobacco comment: quit 16 years ago  Substance Use Topics  . Alcohol use: Yes    Comment: occ  . Drug use: No    Comment: hx of use in his colege days     Allergies   Bee venom; Fish allergy; and Ivp dye [iodinated diagnostic agents]   Review of Systems Review of Systems ROS: Statement: All systems negative except as marked or noted in the HPI; Constitutional: Negative for fever and chills. ; ; Eyes: Negative for eye pain, redness and discharge. ; ; ENMT: Negative for ear pain, hoarseness, nasal congestion, sinus pressure and sore throat. ; ; Cardiovascular: +CP, SOB, diaphoresis. Negative for palpitations, and peripheral edema. ; ; Respiratory: Negative for cough, wheezing and stridor. ; ; Gastrointestinal: +nausea. Negative for vomiting, diarrhea, abdominal pain, blood in stool, hematemesis, jaundice and rectal bleeding. . ; ; Genitourinary: Negative for dysuria, flank pain and hematuria. ; ; Musculoskeletal: Negative for back pain and neck pain. Negative for swelling and trauma.; ; Skin: Negative for pruritus, rash, abrasions, blisters, bruising and skin lesion.; ; Neuro: +lightheadedness. Negative for headache and neck stiffness. Negative for weakness, altered level of consciousness, altered mental status, extremity weakness, paresthesias, involuntary movement, seizure and syncope.       Physical  Exam Updated Vital Signs BP 128/78   Pulse 79   Temp 97.8 F (36.6 C) (Oral)   Resp 17   Ht 5\' 7"  (1.702 m)   Wt 74.4 kg (164 lb)   SpO2 97%   BMI 25.69 kg/m   Physical Exam 1705: Physical examination:  Nursing notes reviewed; Vital signs and O2 SAT reviewed;  Constitutional: Well developed, Well nourished, Well hydrated, In no acute distress; Head:  Normocephalic, atraumatic; Eyes: EOMI, PERRL, No scleral icterus; ENMT: Mouth and pharynx normal, Mucous membranes moist; Neck: Supple, Full range of motion, No lymphadenopathy; Cardiovascular: Regular rate and rhythm, No murmur, rub, or gallop; Respiratory: Breath sounds clear & equal bilaterally, No rales, rhonchi, wheezes.  Speaking full sentences with ease, Normal respiratory effort/excursion; Chest: Nontender, Movement normal; Abdomen:  Soft, Nontender, Nondistended, Normal bowel sounds; Genitourinary: No CVA tenderness; Extremities: Pulses normal, No tenderness, No edema, No calf edema or asymmetry.; Neuro: AA&Ox3, Major CN grossly intact.  Speech clear. No gross focal motor or sensory deficits in extremities.; Skin: Color normal, Warm, Dry.   ED Treatments / Results  Labs (all labs ordered are listed, but only abnormal results are displayed)   EKG  EKG Interpretation  Date/Time:  Wednesday July 29 2017 14:30:22 EST Ventricular Rate:  84 PR Interval:  144 QRS Duration: 166 QT Interval:  412 QTC Calculation: 486 R Axis:   162 Text Interpretation:  Normal sinus rhythm Right bundle branch block Left posterior fascicular block  Bifascicular block  Inferior infarct , age undetermined When compared with ECG of 05/27/2017 No significant change was found Confirmed by Francine Graven [phone]) on 07/29/2017 5:13:01 PM       Radiology   Procedures Procedures (including critical care time)  Medications Ordered in ED Medications - No data to display   Initial Impression / Assessment and Plan / ED Course  I have reviewed the  triage vital signs and the nursing notes.  Pertinent labs & imaging results that were available during my care of the patient were reviewed by me and considered in my medical decision making (see chart for details).  MDM Reviewed: previous chart, nursing note and vitals Reviewed previous: labs and ECG Interpretation: labs, ECG and x-ray   Results for orders placed or performed during the hospital encounter of 24/58/09  Basic metabolic panel  Result Value Ref Range   Sodium 138 135 - 145 mmol/L   Potassium 3.9 3.5 - 5.1 mmol/L   Chloride 104 101 - 111 mmol/L   CO2 21 (L) 22 - 32 mmol/L   Glucose, Bld 188 (H) 65 - 99 mg/dL   BUN 18 6 - 20 mg/dL   Creatinine, Ser 1.05 0.61 - 1.24 mg/dL   Calcium 9.6 8.9 - 10.3 mg/dL   GFR calc non Af Amer >60 >60 mL/min   GFR calc Af Amer >60 >60 mL/min   Anion gap 13 5 - 15  CBC  Result Value Ref Range   WBC 6.3 4.0 - 10.5 K/uL   RBC 4.81 4.22 - 5.81 MIL/uL   Hemoglobin 13.2 13.0 - 17.0 g/dL   HCT 41.8 39.0 - 52.0 %   MCV 86.9 78.0 - 100.0 fL   MCH 27.4 26.0 - 34.0 pg   MCHC 31.6 30.0 - 36.0 g/dL   RDW 14.5 11.5 - 15.5 %   Platelets 262 150 - 400 K/uL  Troponin I  Result Value Ref Range   Troponin I <0.03 <0.03 ng/mL   Dg Chest 2 View Result Date: 07/29/2017 CLINICAL DATA:  Mid to left-sided chest pain intermittent for 2 months. History of diabetes and hypertension. EXAM: CHEST  2 VIEW COMPARISON:  Radiographs 05/26/2017. FINDINGS: The heart size and mediastinal contours are stable. There are linear opacities at both lung bases, similar to previous study, and most consistent with scarring. There is no edema, confluent airspace opacity, pleural effusion or pneumothorax. Mild degenerative changes are present throughout the thoracic spine. IMPRESSION: Stable chest with bibasilar scarring. No acute cardiopulmonary process. Electronically Signed   By: Richardean Sale M.D.   On: 07/29/2017 17:47    1730:  Escalating symptoms over the past 3 months,  including symptoms at rest. Hx IV dye allergy; would need prep for CT scan or cardiac cath. Given this, I believe pt needs admission for definitive  cardiac testing. T/C returned from [facility] Dr. Beau Fanny, case discussed, including:  HPI, pertinent PM/SHx, VS/PE, dx testing, ED course and treatment:  Agrees with EDP, pt will need transfer to [facility] under Hospitalist service and Cards can consult regarding next best testing.  1835:  T/C returned from [facility] Dr. Evangeline Gula, case discussed, including:  HPI, pertinent PM/SHx, VS/PE, dx testing, ED course and treatment:  Agreeable to admit.     Final Clinical Impressions(s) / ED Diagnoses   Final diagnoses:  None    ED Discharge Orders    None       Francine Graven, DO 08/02/17 1701

## 2017-07-30 ENCOUNTER — Encounter (HOSPITAL_COMMUNITY): Payer: Self-pay | Admitting: General Practice

## 2017-07-30 ENCOUNTER — Inpatient Hospital Stay (HOSPITAL_COMMUNITY): Payer: Medicare HMO

## 2017-07-30 DIAGNOSIS — R079 Chest pain, unspecified: Secondary | ICD-10-CM

## 2017-07-30 DIAGNOSIS — I200 Unstable angina: Secondary | ICD-10-CM

## 2017-07-30 DIAGNOSIS — K219 Gastro-esophageal reflux disease without esophagitis: Secondary | ICD-10-CM

## 2017-07-30 DIAGNOSIS — E119 Type 2 diabetes mellitus without complications: Secondary | ICD-10-CM

## 2017-07-30 DIAGNOSIS — R0602 Shortness of breath: Secondary | ICD-10-CM

## 2017-07-30 DIAGNOSIS — I10 Essential (primary) hypertension: Secondary | ICD-10-CM

## 2017-07-30 LAB — BASIC METABOLIC PANEL
Anion gap: 12 (ref 5–15)
BUN: 18 mg/dL (ref 6–20)
CALCIUM: 9.3 mg/dL (ref 8.9–10.3)
CHLORIDE: 104 mmol/L (ref 101–111)
CO2: 23 mmol/L (ref 22–32)
CREATININE: 0.9 mg/dL (ref 0.61–1.24)
GFR calc non Af Amer: >60 mL/min (ref >60)
GLUCOSE: 209 mg/dL — AB (ref 65–99)
Potassium: 3.4 mmol/L — ABNORMAL LOW (ref 3.5–5.1)
Sodium: 139 mmol/L (ref 135–145)

## 2017-07-30 LAB — CBC
HEMATOCRIT: 41.4 % (ref 39.0–52.0)
Hemoglobin: 13.4 g/dL (ref 13.0–17.0)
MCH: 28 pg (ref 26.0–34.0)
MCHC: 32.4 g/dL (ref 30.0–36.0)
MCV: 86.6 fL (ref 78.0–100.0)
PLATELETS: 243 10*3/uL (ref 150–400)
RBC: 4.78 MIL/uL (ref 4.22–5.81)
RDW: 14.5 % (ref 11.5–15.5)
WBC: 5.5 10*3/uL (ref 4.0–10.5)

## 2017-07-30 LAB — ECHOCARDIOGRAM COMPLETE
Ao-asc: 27 cm
E decel time: 225 msec
E/e' ratio: 9.72
FS: 29 % (ref 28–44)
Height: 67 in
IVS/LV PW RATIO, ED: 0.78
LA ID, A-P, ES: 35 mm
LA vol A4C: 31.1 ml
LA vol index: 18 mL/m2
LADIAMINDEX: 1.85 cm/m2
LAVOL: 34 mL
LDCA: 3.46 cm2
LEFT ATRIUM END SYS DIAM: 35 mm
LV E/e' medial: 9.72
LV E/e'average: 9.72
LV PW d: 12.1 mm — AB (ref 0.6–1.1)
LVELAT: 6.09 cm/s
LVOTD: 21 mm
MV Dec: 225
MVPKAVEL: 64.5 m/s
MVPKEVEL: 59.2 m/s
RV LATERAL S' VELOCITY: 7.94 cm/s
RV TAPSE: 17.5 mm
TDI e' lateral: 6.09
TDI e' medial: 6.09
WEIGHTICAEL: 2624 [oz_av]

## 2017-07-30 LAB — TROPONIN I: Troponin I: <0.03 ng/mL (ref <0.03)

## 2017-07-30 LAB — GLUCOSE, CAPILLARY
Glucose-Capillary: 112 mg/dL — ABNORMAL HIGH (ref 65–99)
Glucose-Capillary: 187 mg/dL — ABNORMAL HIGH (ref 65–99)

## 2017-07-30 LAB — HEPARIN LEVEL (UNFRACTIONATED)
HEPARIN UNFRACTIONATED: 0.5 [IU]/mL (ref 0.30–0.70)
Heparin Unfractionated: 0.17 IU/mL — ABNORMAL LOW (ref 0.30–0.70)

## 2017-07-30 LAB — MRSA PCR SCREENING: MRSA by PCR: NEGATIVE

## 2017-07-30 LAB — HEMOGLOBIN A1C
HEMOGLOBIN A1C: 7.4 % — AB (ref 4.8–5.6)
MEAN PLASMA GLUCOSE: 165.68 mg/dL

## 2017-07-30 LAB — CBG MONITORING, ED: GLUCOSE-CAPILLARY: 157 mg/dL — AB (ref 65–99)

## 2017-07-30 MED ORDER — OXYMETAZOLINE HCL 0.05 % NA SOLN
1.0000 | Freq: Two times a day (BID) | NASAL | Status: DC | PRN
  Administered 2017-07-30: 1 via NASAL
  Filled 2017-07-30 (×2): qty 15

## 2017-07-30 MED ORDER — IRBESARTAN 300 MG PO TABS
300.0000 mg | ORAL_TABLET | Freq: Every day | ORAL | Status: DC
  Administered 2017-07-30 – 2017-08-01 (×3): 300 mg via ORAL
  Filled 2017-07-30 (×3): qty 1

## 2017-07-30 MED ORDER — HYDROCHLOROTHIAZIDE 12.5 MG PO CAPS
12.5000 mg | ORAL_CAPSULE | Freq: Every day | ORAL | Status: DC
  Administered 2017-07-30 – 2017-08-01 (×3): 12.5 mg via ORAL
  Filled 2017-07-30 (×3): qty 1

## 2017-07-30 MED ORDER — HEPARIN BOLUS VIA INFUSION
2000.0000 [IU] | Freq: Once | INTRAVENOUS | Status: AC
Start: 2017-07-30 — End: 2017-07-30
  Administered 2017-07-30: 2000 [IU] via INTRAVENOUS

## 2017-07-30 MED ORDER — FAMOTIDINE 20 MG PO TABS
20.0000 mg | ORAL_TABLET | Freq: Once | ORAL | Status: AC
  Administered 2017-07-31: 20 mg via ORAL
  Filled 2017-07-30 (×2): qty 1

## 2017-07-30 NOTE — Progress Notes (Signed)
ANTICOAGULATION CONSULT NOTE - follow up [place] for Heparin Indication: chest pain/ACS  Allergies  Allergen Reactions  . Bee Venom   . Fish Allergy Hives and Swelling  . Ivp Dye [Iodinated Diagnostic Agents] Swelling   Patient Measurements: Height: 5\' 7"  (170.2 cm) Weight: 164 lb 14.4 oz (74.8 kg) IBW/kg (Calculated) : 66.1 HEPARIN DW (KG): 74.4   Vital Signs: Temp: 98 F (36.7 C) (02/28 1135) Temp Source: Oral (02/28 1135) BP: 127/81 (02/28 1600) Pulse Rate: 93 (02/28 1109)  Labs: Recent Labs    07/29/17 1445 07/29/17 1908 07/29/17 1943 07/30/17 0230 07/30/17 1604  HGB 13.2  --   --  13.4  --   HCT 41.8  --   --  41.4  --   PLT 262  --   --  243  --   APTT  --  33  --   --   --   LABPROT  --  12.6  --   --   --   INR  --  0.95  --   --   --   HEPARINUNFRC  --   --   --  0.17* 0.50  CREATININE 1.05  --   --  0.90  --   TROPONINI <0.03  --  <0.03 <0.03  --     Estimated Creatinine Clearance: 74.5 mL/min (by C-G formula based on SCr of 0.9 mg/dL).   Medical History: Past Medical History:  Diagnosis Date  . Arthritis   . Cancer ([place])   . Colon polyps   . Diabetes mellitus without complication ([place])   . Diverticulitis   . Hypertension   . IBS (irritable bowel syndrome)   . 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 Unstable angina ([place]) 07/2017    Assessment: Ronald Faulkner is a 67 y.o. male who is being seen today for the evaluation of chest pain. HE has been having recurrent chest pain, substernal, occurs with exertion and relieved at rest. Plan for cardiac cath 3/1. Heparin per pharmacy protocol. CBC wnl  Heparin level this evening is therapeutic.  Goal of Therapy:  Heparin level 0.3-0.7 units/ml Monitor platelets by anticoagulation protocol: Yes   Plan:  Continue heparin infusion at 1000 units/hr Check anti-Xa level in 6 hours and daily while on heparin Continue to monitor H&H and  platelets   Thank you for allowing [place] to participate in this patients care.  Jens Som, PharmD Clinical phone for 07/30/2017: [phone] If after 10:30p, please call main pharmacy at: [phone] 07/30/2017 5:57 PM

## 2017-07-30 NOTE — Progress Notes (Signed)
Cardiology MD at bedside, new orders received for patient to receive food today and NPO at midnight for cath on 07/31/16

## 2017-07-30 NOTE — Progress Notes (Signed)
ANTICOAGULATION CONSULT NOTE - follow up Ronald Faulkner for Heparin Indication: chest pain/ACS  Allergies  Allergen Reactions  . Bee Venom   . Fish Allergy Hives and Swelling  . Ivp Dye [Iodinated Diagnostic Agents] Swelling   Patient Measurements: Height: 5\' 7"  (170.2 cm) Weight: 164 lb (74.4 kg) IBW/kg (Calculated) : 66.1 HEPARIN DW (KG): 74.4   Vital Signs: BP: 130/81 (02/28 0700) Pulse Rate: 79 (02/28 0700)  Labs: Recent Labs    07/29/17 1445 07/29/17 1908 07/29/17 1943 07/30/17 0230  HGB 13.2  --   --  13.4  HCT 41.8  --   --  41.4  PLT 262  --   --  243  APTT  --  33  --   --   LABPROT  --  12.6  --   --   INR  --  0.95  --   --   HEPARINUNFRC  --   --   --  0.17*  CREATININE 1.05  --   --  0.90  TROPONINI <0.03  --  <0.03 <0.03    Estimated Creatinine Clearance: 74.5 mL/min (by C-G formula based on SCr of 0.9 mg/dL).   Medical History: Past Medical History:  Diagnosis Date  . Arthritis   . Cancer ([place])   . Colon polyps   . Diabetes mellitus without complication ([place])   . Diverticulitis   . Hypertension   . IBS (irritable bowel syndrome)   . 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  Not currently taking oral anticoagulant.  Assessment: Okay for Protocol, baseline anticoag labs pending.  Likely Tx to [facility] for further cardiac work-up. Heparin level this AM is subtherapeutic.  Goal of Therapy:  Heparin level 0.3-0.7 units/ml Monitor platelets by anticoagulation protocol: Yes   Plan:  Give 2000 units bolus x 1 Increase heparin infusion at 1000 units/hr Check anti-Xa level in 6-8 hours and daily while on heparin Continue to monitor H&H and platelets  Ronald Faulkner, BS Ronald Faulkner, BCPS Clinical Pharmacist Pager [phone] 07/30/2017,7:32 AM

## 2017-07-30 NOTE — ED Notes (Signed)
Dr Manuella Ghazi is ok with pt staying in ER here until he gets a tele[facility] bed [facility].

## 2017-07-30 NOTE — Progress Notes (Signed)
  Echocardiogram 2D Echocardiogram has been performed.  Lurine Imel G Keyarra Rendall 07/30/2017, 11:40 AM

## 2017-07-30 NOTE — Progress Notes (Signed)
Patient ID: Ronald Faulkner, male   DOB: [birth year], 67 y.o.   MRN: [id number]  PROGRESS NOTE    Ronald Faulkner  STM:[id number] DOB: 1951/01/11 DOA: 07/29/2017 PCP: [name], DO   Brief Narrative:  67 year old male with history of diabetes, hypertension, hyperlipidemia, former smoker presented on 07/29/2017 to [facility] for chest pain progressively worsening for 2 months. Patient was admitted with unstable angina and started on heparin drip and transferred to [facility] as per cardiology recommendations.  Assessment & Plan:   Principal Problem:   Unstable angina (HCC) Active Problems:   HTN (hypertension)   GERD (gastroesophageal reflux disease)   SOB (shortness of breath)   Type 2 diabetes mellitus ([place])   1.  Unstable angina:  - Currently on heparin drip. Cardiology called. Currently chest pain-free  - Continue aspirin, statin and metoprolol  - 2-D echo  - Continue nothing by mouth for probable cardiac catheterization today  2.  Hypertension:  - Stable. Continue metoprolol, hydrochlorothiazide and irbesartan  3.  Type 2 diabetes mellitus: - Hold metformin. Accu-Cheks with coverage.   4.  Gastroesophageal reflux disease: Continue omeprazole.  5. Hypokalemia -Continue replacement in IV fluids. Repeat a.m. labs   DVT prophylaxis: Heparin drip Code Status:  Full Family Communication: None at bedside Disposition Plan: Depends on clinical outcome  Consultants: Cardiology  Procedures: None  Antimicrobials: None    Subjective: Patient seen and examined at bedside. he denies any current chest pain. No overnight fever or vomiting.  Objective: Vitals:   07/30/17 0645 07/30/17 0646 07/30/17 0700 07/30/17 0730  BP: 123/75 123/75 130/81 128/85  Pulse: 79 80 79 77  Resp: (!) 23 14 20 20   Temp:      TempSrc:      SpO2: 98% 98% 96% 96%  Weight:      Height:       No intake or output data in the 24 hours ending 07/30/17 0900 Filed Weights     07/29/17 1422  Weight: 74.4 kg (164 lb)    Examination:  General exam: Appears calm and comfortable. No distress Respiratory system: Bilateral decreased breath sound at bases Cardiovascular system: S1 & S2 heard, rate controlled  Gastrointestinal system: Abdomen is nondistended, soft and nontender. Normal bowel sounds heard. Central nervous system: Alert and oriented. No focal neurological deficits. Moving extremities Extremities: No cyanosis, clubbing, edema  Skin: No rashes, lesions or ulcers Psychiatry: Mood & affect appropriate.     Data Reviewed: I have personally reviewed following labs and imaging studies  CBC: Recent Labs  Lab 07/29/17 1445 07/30/17 0230  WBC 6.3 5.5  HGB 13.2 13.4  HCT 41.8 41.4  MCV 86.9 86.6  PLT 262 892   Basic Metabolic Panel: Recent Labs  Lab 07/29/17 1445 07/30/17 0230  NA 138 139  K 3.9 3.4*  CL 104 104  CO2 21* 23  GLUCOSE 188* 209*  BUN 18 18  CREATININE 1.05 0.90  CALCIUM 9.6 9.3   GFR: Estimated Creatinine Clearance: 74.5 mL/min (by C-G formula based on SCr of 0.9 mg/dL). Liver Function Tests: No results for input(s): AST, ALT, ALKPHOS, BILITOT, PROT, ALBUMIN in the last 168 hours. No results for input(s): LIPASE, AMYLASE in the last 168 hours. No results for input(s): AMMONIA in the last 168 hours. Coagulation Profile: Recent Labs  Lab 07/29/17 1908  INR 0.95   Cardiac Enzymes: Recent Labs  Lab 07/29/17 1445 07/29/17 1943 07/30/17 0230  TROPONINI <0.03 <0.03 <0.03   BNP (last  3 results) No results for input(s): PROBNP in the last 8760 hours. HbA1C: No results for input(s): HGBA1C in the last 72 hours. CBG: Recent Labs  Lab 07/30/17 0734  GLUCAP 157*   Lipid Profile: No results for input(s): CHOL, HDL, LDLCALC, TRIG, CHOLHDL, LDLDIRECT in the last 72 hours. Thyroid Function Tests: No results for input(s): TSH, T4TOTAL, FREET4, T3FREE, THYROIDAB in the last 72 hours. Anemia Panel: No results for  input(s): VITAMINB12, FOLATE, FERRITIN, TIBC, IRON, RETICCTPCT in the last 72 hours. Sepsis Labs: No results for input(s): PROCALCITON, LATICACIDVEN in the last 168 hours.  No results found for this or any previous visit (from the past 240 hour(s)).       Radiology Studies: Dg Chest 2 View  Result Date: 07/29/2017 CLINICAL DATA:  Mid to left-sided chest pain intermittent for 2 months. History of diabetes and hypertension. EXAM: CHEST  2 VIEW COMPARISON:  Radiographs 05/26/2017. FINDINGS: The heart size and mediastinal contours are stable. There are linear opacities at both lung bases, similar to previous study, and most consistent with scarring. There is no edema, confluent airspace opacity, pleural effusion or pneumothorax. Mild degenerative changes are present throughout the thoracic spine. IMPRESSION: Stable chest with bibasilar scarring. No acute cardiopulmonary process. Electronically Signed   By: Richardean Sale M.D.   On: 07/29/2017 17:47        Scheduled Meds: . allopurinol  300 mg Oral BID  . aspirin EC  81 mg Oral Daily  . atorvastatin  40 mg Oral q1800  . irbesartan  300 mg Oral Daily   And  . hydrochlorothiazide  12.5 mg Oral Daily  . insulin aspart  0-9 Units Subcutaneous TID WC  . metFORMIN  500 mg Oral BID WC  . metoprolol succinate  25 mg Oral Daily  . nitroGLYCERIN  1 inch Topical Q6H  . pantoprazole  40 mg Oral Daily  . sodium chloride flush  3 mL Intravenous Q12H   Continuous Infusions: . 0.9 % NaCl with KCl 20 mEq / L 125 mL/hr at 07/29/17 2334  . heparin 1,000 Units/hr (07/30/17 0746)     LOS: 1 day        Aline August, MD [facility] Pager [phone]  If 7PM-7AM, please contact night-coverage [email or web address] Password [email or web address] 07/30/2017, 9:00 AM

## 2017-07-30 NOTE — Progress Notes (Signed)
MD paged for pt request of afrin for nasal stuffiness that he reports causes him to have headaches, will admin tylenol and new order received for afrin

## 2017-07-30 NOTE — ED Notes (Signed)
[facility] arrived to transport pt 

## 2017-07-30 NOTE — Consult Note (Signed)
Cardiology Consultation:   Patient ID: Ronald Faulkner; [id number]; Nov 02, 1950   Admit date: 07/29/2017 Date of Consult: 07/30/2017  Primary Care Provider: [name], DO Primary Cardiologist: Dr. Claiborne Billings Primary Electrophysiologist:  None   Patient Profile:   Ronald Faulkner is a 67 y.o. male with a PMH of HTN, HLD, DM type 2, GERD, prostate CA s/p prostatectomy, and former smoker who is being seen today for the evaluation of chest pain at the request of Dr. Evangeline Gula.  History of Present Illness:   Ronald Faulkner was recently admitted to [facility] 05/2017 with complaints of chest pain. Given atypical presentation and normal echocardiogram, patient was recommended for outpatient Coronary CT scan. Unfortunately his insurance has been denying coverage for this test and he is in the appeals process.   He was evaluated outpatient by Dr. Claiborne Billings 07/07/17 and was chest pain free at that visit. BP was stable and he was continued on irbesartan/HCTZ. He was started on Toprol-XL 25mg  daily for tachycardia and labile HTN and recommended to restart atorvastatin 40mg  daily (self-d/c'd). Efforts were continued at this time to pursue Coronary CT scan.   Patient states he has had continuous intermittent chest pain since prior to his admission 05/2017. He notes the frequency of episodes has increased over the past several weeks with 2-3 episodes occurring each day. He typically notes an achy chest pain with exertion which is substernal, radiating to his back, and associated with a flushing sensation, SOB, and lightheadedness. Over the past several weeks he has noticed intermittent neck pain associated with chest pain, as well as episodes of chest pain at rest. He states the episodes last 5-10 minutes and are relieved with rest. He notes bendopnea, orthopnea, and intermittent LE edema. He expresses frustration with difficulty getting insurance coverage for coronary CT scan and would like an answer to what's causing his chest  pain. His last episode of chest pain occurred early this AM and was shorter in duration. He is unsure if the nitroglycerin is helping.  Hospital course: VSS. Labs notable for electrolytes wnl, Cr 1.05, Hgb 13.2, PLT 262, Troponin negative x3. EKG with sinus rhythm, RBBB, LPFB (both chronic). CXR with stable bibasilar scarring, no acute changes. Given concern for unstable angina patient was transferred from [facility] to [facility] for further evaluation. Cardiology consulted for further recommendations.   Past Medical History:  Diagnosis Date  . Arthritis   . Cancer ([place])   . Colon polyps   . Diabetes mellitus without complication ([place])   . Diverticulitis   . Hypertension   . IBS (irritable bowel syndrome)   . 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 ABDOMINAL SURGERY    . APPENDECTOMY    . BREAST SURGERY     benign lump at lt side  . COLON SURGERY    . COLONOSCOPY  03/14/2011   Procedure: COLONOSCOPY;  Surgeon: [name], MD;  Location: [facility];  Service: Endoscopy;  Laterality: N/A;  1:00  . COLONOSCOPY N/A 11/15/2015   Procedure: COLONOSCOPY;  Surgeon: [name], MD;  Location: [facility];  Service: Endoscopy;  Laterality: N/A;  11:15  . PENILE PROSTHESIS IMPLANT     2016   . prostate cancer     2015. prostectomy     Home Medications:  Prior to Admission medications   Medication Sig Start Date End Date Taking? Authorizing Provider  allopurinol (ZYLOPRIM)  300 MG tablet Take 300 mg by mouth 2 (two) times daily.  11/10/11  Yes [provider]  aspirin EC 81 MG tablet Take 1 tablet (81 mg total) by mouth daily. 05/27/17 05/27/18 Yes Sheikh, Omair Latif, DO  atorvastatin (LIPITOR) 40 MG tablet Take 1 tablet (40 mg total) by mouth daily at 6 PM. 07/07/17  Yes Troy Sine, MD  ibuprofen (ADVIL,MOTRIN) 800 MG tablet Take 800 mg by mouth every 6 (six) hours as needed for  moderate pain.    Yes [provider]  irbesartan-hydrochlorothiazide (AVALIDE) 300-12.5 MG tablet Take 1 tablet by mouth daily.  07/05/17  Yes [provider]  linaclotide (LINZESS) 145 MCG CAPS capsule Take 145 mcg by mouth daily as needed (for constipation). As needed   Yes [provider]  metFORMIN (GLUCOPHAGE) 500 MG tablet Take 1 tablet by mouth 2 (two) times daily with a meal.  05/21/14  Yes [provider]  metoprolol succinate (TOPROL XL) 25 MG 24 hr tablet Take 1 tablet (25 mg total) by mouth daily. 07/07/17  Yes Troy Sine, MD  omeprazole (PRILOSEC) 20 MG capsule Take 20 mg by mouth daily. 03/27/17  Yes [provider]  oxymetazoline (12 HOUR NASAL SPRAY) 0.05 % nasal spray Place 1 spray into both nostrils 2 (two) times daily.    Yes [provider]  metoprolol tartrate (LOPRESSOR) 50 MG tablet Take 1 tablet (50 mg total) by mouth once for 1 dose. TAKE ONE HOUR PRIOR TO  SCHEDULE CARDAIC TEST Patient not taking: Reported on 07/07/2017 06/29/17 06/29/17  Consuelo Pandy, PA-C    Inpatient Medications: Scheduled Meds: . allopurinol  300 mg Oral BID  . aspirin EC  81 mg Oral Daily  . atorvastatin  40 mg Oral q1800  . irbesartan  300 mg Oral Daily   And  . hydrochlorothiazide  12.5 mg Oral Daily  . insulin aspart  0-9 Units Subcutaneous TID WC  . metoprolol succinate  25 mg Oral Daily  . nitroGLYCERIN  1 inch Topical Q6H  . pantoprazole  40 mg Oral Daily  . sodium chloride flush  3 mL Intravenous Q12H   Continuous Infusions: . 0.9 % NaCl with KCl 20 mEq / L 75 mL/hr at 07/30/17 0939  . heparin 1,000 Units/hr (07/30/17 0746)   PRN Meds: acetaminophen **OR** acetaminophen, linaclotide, morphine injection, nitroGLYCERIN, ondansetron **OR** ondansetron (ZOFRAN) IV, polyethylene glycol, traMADol  Allergies:    Allergies  Allergen Reactions  . Bee Venom   . Fish Allergy Hives and Swelling  . Ivp Dye [Iodinated Diagnostic  Agents] Swelling    Social History:   Social History   Socioeconomic History  . Marital status: Married    Spouse name: Not on file  . Number of children: 2  . Years of education: Not on file  . Highest education level: Not on file  Social Needs  . Financial resource strain: Not on file  . Food insecurity - worry: Not on file  . Food insecurity - inability: Not on file  . Transportation needs - medical: Not on file  . Transportation needs - non-medical: Not on file  Occupational History    Employer: [employer]  Tobacco Use  . Smoking status: Former Smoker    Packs/day: 1.00    Years: 35.00    Pack years: 35.00    Types: Cigarettes    Last attempt to quit: 06/03/1999    Years since quitting: 18.1  . Smokeless tobacco: Never  Used  . Tobacco comment: quit 16 years ago  Substance and Sexual Activity  . Alcohol use: Yes    Comment: occ  . Drug use: No    Comment: hx of use in his colege days  . Sexual activity: Not on file  Other Topics Concern  . Not on file  Social History Narrative   Lives at home with wife and daughter and her three children.      Family History:    Family History  Problem Relation Age of Onset  . Colon cancer Mother   . Coronary artery disease Maternal Grandmother   . Anesthesia problems Neg Hx   . Hypotension Neg Hx   . Malignant hyperthermia Neg Hx   . Pseudochol deficiency Neg Hx      ROS:  Please see the history of present illness.   All other ROS reviewed and negative.     Physical Exam/Data:   Vitals:   07/30/17 0646 07/30/17 0700 07/30/17 0730 07/30/17 0900  BP: 123/75 130/81 128/85 137/86  Pulse: 80 79 77 69  Resp: 14 20 20 16   Temp:      TempSrc:      SpO2: 98% 96% 96% 98%  Weight:      Height:        Intake/Output Summary (Last 24 hours) at 07/30/2017 1016 Last data filed at 07/30/2017 0939 Gross per 24 hour  Intake 1279.25 ml  Output 0 ml  Net 1279.25 ml   Filed Weights   07/29/17 1422  Weight: 164 lb  (74.4 kg)   Body mass index is 25.69 kg/m.  General:  Well nourished, well developed, laying in bed undergoing echocardiogram at time of evaluation. In no acute distress HEENT: sclera anicteric  Neck: no JVD Endocrine:  No thryomegaly Vascular: No carotid bruits; distal pulses 2+ bilaterally Cardiac:  normal S1, S2; RRR; no murmur, gallops, or rubs Lungs:  clear to auscultation bilaterally, no wheezing, rhonchi or rales  Abd: NABS, soft, nontender, no hepatomegaly Ext: no edema Musculoskeletal:  No deformities, BUE and BLE strength normal and equal Skin: warm and dry  Neuro:  CNs 2-12 intact, no focal abnormalities noted Psych:  Normal affect   EKG:  The EKG was personally reviewed and demonstrates:  Sinus rhythm with chronic RBBB and LPFB Telemetry:  Telemetry was personally reviewed and demonstrates:  Sinus    Relevant CV Studies:  Echocardiogram 05/27/17: Study Conclusions  - Left ventricle: The cavity size was mildly dilated. Wall   thickness was normal. Systolic function was normal. The estimated   ejection fraction was in the range of 50% to 55%. Wall motion was   normal; there were no regional wall motion abnormalities. Doppler   parameters are consistent with abnormal left ventricular   relaxation (grade 1 diastolic dysfunction).  Impressions:  - Normal LV systolic function; mild diastolic dysfunction; mild   LVE; trace MR and TR.  Laboratory Data:  Chemistry Recent Labs  Lab 07/29/17 1445 07/30/17 0230  NA 138 139  K 3.9 3.4*  CL 104 104  CO2 21* 23  GLUCOSE 188* 209*  BUN 18 18  CREATININE 1.05 0.90  CALCIUM 9.6 9.3  GFRNONAA >60 >60  GFRAA >60 >60  ANIONGAP 13 12    No results for input(s): PROT, ALBUMIN, AST, ALT, ALKPHOS, BILITOT in the last 168 hours. Hematology Recent Labs  Lab 07/29/17 1445 07/30/17 0230  WBC 6.3 5.5  RBC 4.81 4.78  HGB 13.2 13.4  HCT 41.8 41.4  MCV 86.9 86.6  MCH 27.4 28.0  MCHC 31.6 32.4  RDW 14.5 14.5  PLT  262 243   Cardiac Enzymes Recent Labs  Lab 07/29/17 1445 07/29/17 1943 07/30/17 0230  TROPONINI <0.03 <0.03 <0.03   No results for input(s): TROPIPOC in the last 168 hours.  BNPNo results for input(s): BNP, PROBNP in the last 168 hours.  DDimer No results for input(s): DDIMER in the last 168 hours.  Radiology/Studies:  Dg Chest 2 View  Result Date: 07/29/2017 CLINICAL DATA:  Mid to left-sided chest pain intermittent for 2 months. History of diabetes and hypertension. EXAM: CHEST  2 VIEW COMPARISON:  Radiographs 05/26/2017. FINDINGS: The heart size and mediastinal contours are stable. There are linear opacities at both lung bases, similar to previous study, and most consistent with scarring. There is no edema, confluent airspace opacity, pleural effusion or pneumothorax. Mild degenerative changes are present throughout the thoracic spine. IMPRESSION: Stable chest with bibasilar scarring. No acute cardiopulmonary process. Electronically Signed   By: Richardean Sale M.D.   On: 07/29/2017 17:47    Assessment and Plan:   1. Unstable Angina: patient continues to have intermittent chest pain with exertion and now with rest since discharge from the hospital 05/2017. He states pain is substernal radiating to his back with occasional radiation to neck. Has associated flushing, SOB, and lightheadedness. Episodes last 5-10 minutes and are relieved with rest. Troponin negative x3. EKG with chronic RBBB and LPFB making it difficult to evaluate for ischemia. Last echocardiogram 05/2017 with normal LVEF, no wall motion abnormalities, and G1DD. Risk factors for ACS include HTN, HLD, DM, family history of CAD, and former smoker status (wife still smokes indoors). Has been trying to obtain outpatient Coronary CT, unfortunately has been having insurance difficulties.  - Echocardiogram pending to evaluate LVEF and wall motion - Currently on heparin gtt - Continue ASA and statin - Continue nitro paste - Would  benefit from further ischemic work up with cardiac catheterization - will plan for LHC 3/1 at 7:30am.  2. HTN: BP stable - Continue home irbesartan, HCTZ, and metoprolol XL  3. Dyslipidema: LDL 99 05/2017 - goal of <70 - Continue atorvastatin 40mg   4. DM type 2: Last Hgb A1C 6.3 05/2017 - at goal of <7 - Continue ISS while inpatient  5. GERD: - Continue omeprazole   For questions or updates, please contact [facility] Please consult [email or web address] for contact info under Cardiology/STEMI.   Signed, Abigail Butts, PA-C  07/30/2017 10:16 AM [phone]

## 2017-07-31 ENCOUNTER — Encounter (HOSPITAL_COMMUNITY): Payer: Self-pay | Admitting: Cardiovascular Disease

## 2017-07-31 ENCOUNTER — Encounter (HOSPITAL_COMMUNITY)
Admission: EM | Disposition: A | Discharge status: Home / Self Care | Payer: Self-pay | Source: Home / Self Care | Attending: Internal Medicine

## 2017-07-31 HISTORY — PX: LEFT HEART CATH AND CORONARY ANGIOGRAPHY: CATH118249

## 2017-07-31 HISTORY — PX: CORONARY STENT INTERVENTION: CATH118234

## 2017-07-31 LAB — POCT ACTIVATED CLOTTING TIME: Activated Clotting Time: 290 seconds

## 2017-07-31 LAB — BASIC METABOLIC PANEL
ANION GAP: 10 (ref 5–15)
BUN: 13 mg/dL (ref 6–20)
CALCIUM: 8.8 mg/dL — AB (ref 8.9–10.3)
CO2: 22 mmol/L (ref 22–32)
Chloride: 108 mmol/L (ref 101–111)
Creatinine, Ser: 0.89 mg/dL (ref 0.61–1.24)
GFR calc Af Amer: >60 mL/min (ref >60)
Glucose, Bld: 129 mg/dL — ABNORMAL HIGH (ref 65–99)
POTASSIUM: 3.8 mmol/L (ref 3.5–5.1)
SODIUM: 140 mmol/L (ref 135–145)

## 2017-07-31 LAB — GLUCOSE, CAPILLARY
GLUCOSE-CAPILLARY: 149 mg/dL — AB (ref 65–99)
GLUCOSE-CAPILLARY: 166 mg/dL — AB (ref 65–99)
GLUCOSE-CAPILLARY: 153 mg/dL — AB (ref 65–99)
GLUCOSE-CAPILLARY: 213 mg/dL — AB (ref 65–99)
GLUCOSE-CAPILLARY: 207 mg/dL — AB (ref 65–99)

## 2017-07-31 LAB — CBC
HCT: 35.3 % — ABNORMAL LOW (ref 39.0–52.0)
Hemoglobin: 11.3 g/dL — ABNORMAL LOW (ref 13.0–17.0)
MCH: 27.4 pg (ref 26.0–34.0)
MCHC: 32 g/dL (ref 30.0–36.0)
MCV: 85.5 fL (ref 78.0–100.0)
PLATELETS: 230 10*3/uL (ref 150–400)
RBC: 4.13 MIL/uL — AB (ref 4.22–5.81)
RDW: 14.4 % (ref 11.5–15.5)
WBC: 6.4 10*3/uL (ref 4.0–10.5)

## 2017-07-31 LAB — HEMOGLOBIN A1C
Hgb A1c MFr Bld: 7.4 % — ABNORMAL HIGH (ref 4.8–5.6)
MEAN PLASMA GLUCOSE: 165.68 mg/dL

## 2017-07-31 LAB — HEPARIN LEVEL (UNFRACTIONATED): HEPARIN UNFRACTIONATED: 0.42 [IU]/mL (ref 0.30–0.70)

## 2017-07-31 LAB — MAGNESIUM: MAGNESIUM: 1.8 mg/dL (ref 1.7–2.4)

## 2017-07-31 SURGERY — LEFT HEART CATH AND CORONARY ANGIOGRAPHY
Anesthesia: LOCAL

## 2017-07-31 MED ORDER — LIDOCAINE HCL (PF) 1 % IJ SOLN
INTRAMUSCULAR | Status: AC
  Filled 2017-07-31: qty 30

## 2017-07-31 MED ORDER — CLOPIDOGREL BISULFATE 300 MG PO TABS
ORAL_TABLET | ORAL | Status: AC
  Filled 2017-07-31: qty 2

## 2017-07-31 MED ORDER — MIDAZOLAM HCL 2 MG/2ML IJ SOLN
INTRAMUSCULAR | Status: DC | PRN
  Administered 2017-07-31: 2 mg via INTRAVENOUS

## 2017-07-31 MED ORDER — DIPHENHYDRAMINE HCL 50 MG/ML IJ SOLN: 50.0000 mg | Freq: Once | INTRAMUSCULAR | Status: DC

## 2017-07-31 MED ORDER — HEPARIN SODIUM (PORCINE) 1000 UNIT/ML IJ SOLN
INTRAMUSCULAR | Status: DC | PRN
  Administered 2017-07-31: 6000 [IU] via INTRAVENOUS
  Administered 2017-07-31: 4000 [IU] via INTRAVENOUS
  Administered 2017-07-31: 2000 [IU] via INTRAVENOUS

## 2017-07-31 MED ORDER — IOPAMIDOL (ISOVUE-370) INJECTION 76%
INTRAVENOUS | Status: AC
  Filled 2017-07-31: qty 100

## 2017-07-31 MED ORDER — SODIUM CHLORIDE 0.9% FLUSH: 3.0000 mL | INTRAVENOUS | Status: DC | PRN

## 2017-07-31 MED ORDER — HEPARIN SODIUM (PORCINE) 1000 UNIT/ML IJ SOLN
INTRAMUSCULAR | Status: AC
  Filled 2017-07-31: qty 1

## 2017-07-31 MED ORDER — VERAPAMIL HCL 2.5 MG/ML IV SOLN
INTRAVENOUS | Status: DC | PRN
  Administered 2017-07-31: 10 mL via INTRA_ARTERIAL

## 2017-07-31 MED ORDER — LIDOCAINE HCL (PF) 1 % IJ SOLN
INTRAMUSCULAR | Status: DC | PRN
  Administered 2017-07-31: 2 mL

## 2017-07-31 MED ORDER — SODIUM CHLORIDE 0.9 % IV SOLN: INTRAVENOUS | Status: AC

## 2017-07-31 MED ORDER — SODIUM CHLORIDE 0.9% FLUSH: 3.0000 mL | Freq: Two times a day (BID) | INTRAVENOUS | Status: DC

## 2017-07-31 MED ORDER — PREDNISONE 20 MG PO TABS
50.0000 mg | ORAL_TABLET | Freq: Four times a day (QID) | ORAL | Status: DC
  Administered 2017-07-31 (×2): 50 mg via ORAL
  Filled 2017-07-31 (×2): qty 2

## 2017-07-31 MED ORDER — HEPARIN (PORCINE) IN NACL 2-0.9 UNIT/ML-% IJ SOLN
INTRAMUSCULAR | Status: AC
  Filled 2017-07-31: qty 1000

## 2017-07-31 MED ORDER — LABETALOL HCL 5 MG/ML IV SOLN: 10.0000 mg | INTRAVENOUS | Status: AC | PRN

## 2017-07-31 MED ORDER — SODIUM CHLORIDE 0.9 % WEIGHT BASED INFUSION
3.0000 mL/kg/h | INTRAVENOUS | Status: DC
Start: 2017-07-31 — End: 2017-07-31
  Administered 2017-07-31: 3 mL/kg/h via INTRAVENOUS

## 2017-07-31 MED ORDER — SODIUM CHLORIDE 0.9% FLUSH
3.0000 mL | Freq: Two times a day (BID) | INTRAVENOUS | Status: DC
  Administered 2017-07-31: 3 mL via INTRAVENOUS

## 2017-07-31 MED ORDER — DIPHENHYDRAMINE HCL 25 MG PO CAPS: 50.0000 mg | ORAL_CAPSULE | Freq: Once | ORAL | Status: DC

## 2017-07-31 MED ORDER — CLOPIDOGREL BISULFATE 300 MG PO TABS
ORAL_TABLET | ORAL | Status: DC | PRN
  Administered 2017-07-31: 600 mg via ORAL

## 2017-07-31 MED ORDER — VERAPAMIL HCL 2.5 MG/ML IV SOLN
INTRAVENOUS | Status: AC
  Filled 2017-07-31: qty 2

## 2017-07-31 MED ORDER — HEPARIN (PORCINE) IN NACL 2-0.9 UNIT/ML-% IJ SOLN
INTRAMUSCULAR | Status: DC | PRN
  Administered 2017-07-31 (×2): 500 mL

## 2017-07-31 MED ORDER — SODIUM CHLORIDE 0.9 % WEIGHT BASED INFUSION: 3.0000 mL/kg/h | INTRAVENOUS | Status: DC

## 2017-07-31 MED ORDER — MIDAZOLAM HCL 2 MG/2ML IJ SOLN
INTRAMUSCULAR | Status: AC
  Filled 2017-07-31: qty 2

## 2017-07-31 MED ORDER — SODIUM CHLORIDE 0.9 % IV SOLN: 250.0000 mL | INTRAVENOUS | Status: DC | PRN

## 2017-07-31 MED ORDER — SODIUM CHLORIDE 0.9 % WEIGHT BASED INFUSION: 1.0000 mL/kg/h | INTRAVENOUS | Status: DC

## 2017-07-31 MED ORDER — HYDRALAZINE HCL 20 MG/ML IJ SOLN: 5.0000 mg | INTRAMUSCULAR | Status: AC | PRN

## 2017-07-31 MED ORDER — FENTANYL CITRATE (PF) 100 MCG/2ML IJ SOLN
INTRAMUSCULAR | Status: DC | PRN
  Administered 2017-07-31: 50 ug via INTRAVENOUS

## 2017-07-31 MED ORDER — CLOPIDOGREL BISULFATE 75 MG PO TABS
75.0000 mg | ORAL_TABLET | Freq: Every day | ORAL | Status: DC
  Administered 2017-08-01: 09:00:00 75 mg via ORAL
  Filled 2017-07-31: qty 1

## 2017-07-31 MED ORDER — IOPAMIDOL (ISOVUE-370) INJECTION 76%
INTRAVENOUS | Status: DC | PRN
  Administered 2017-07-31: 175 mL via INTRA_ARTERIAL

## 2017-07-31 MED ORDER — FENTANYL CITRATE (PF) 100 MCG/2ML IJ SOLN
INTRAMUSCULAR | Status: AC
  Filled 2017-07-31: qty 2

## 2017-07-31 MED ORDER — SODIUM CHLORIDE 0.9 % WEIGHT BASED INFUSION
1.0000 mL/kg/h | INTRAVENOUS | Status: DC
  Administered 2017-07-31: 1 mL/kg/h via INTRAVENOUS

## 2017-07-31 MED ORDER — MORPHINE SULFATE (PF) 4 MG/ML IV SOLN
2.0000 mg | INTRAVENOUS | Status: DC | PRN
Start: 2017-07-31 — End: 2017-08-01

## 2017-07-31 SURGICAL SUPPLY — 18 items
BALLN SAPPHIRE 2.0X12 (BALLOONS) ×2
BALLN SAPPHIRE ~~LOC~~ 2.75X12 (BALLOONS) ×1 IMPLANT
BALLOON SAPPHIRE 2.0X12 (BALLOONS) IMPLANT
CATH IMPULSE 5F ANG/FL3.5 (CATHETERS) ×1 IMPLANT
CATH LAUNCHER 6FR EBU3.5 (CATHETERS) ×1 IMPLANT
CATH VISTA GUIDE 6FR XBLAD3.5 (CATHETERS) ×1 IMPLANT
DEVICE RAD COMP TR BAND LRG (VASCULAR PRODUCTS) ×1 IMPLANT
GLIDESHEATH SLEND SS 6F .021 (SHEATH) ×1 IMPLANT
GUIDEWIRE INQWIRE 1.5J.035X260 (WIRE) IMPLANT
INQWIRE 1.5J .035X260CM (WIRE) ×2
KIT ENCORE 26 ADVANTAGE (KITS) ×1 IMPLANT
KIT HEART LEFT (KITS) ×2 IMPLANT
PACK CARDIAC CATHETERIZATION (CUSTOM PROCEDURE TRAY) ×2 IMPLANT
STENT SIERRA 2.25 X 12 MM (Permanent Stent) ×1 IMPLANT
STENT SIERRA 2.50 X 18 MM (Permanent Stent) ×1 IMPLANT
TRANSDUCER W/STOPCOCK (MISCELLANEOUS) ×2 IMPLANT
TUBING CIL FLEX 10 FLL-RA (TUBING) ×2 IMPLANT
WIRE COUGAR XT STRL 190CM (WIRE) ×1 IMPLANT

## 2017-07-31 NOTE — Progress Notes (Signed)
ANTICOAGULATION CONSULT NOTE - follow up [place] for Heparin Indication: chest pain/ACS  Allergies  Allergen Reactions  . Bee Venom   . Fish Allergy Hives and Swelling  . Ivp Dye [Iodinated Diagnostic Agents] Swelling   Patient Measurements: Height: 5\' 7"  (170.2 cm) Weight: 164 lb 0.4 oz (74.4 kg) IBW/kg (Calculated) : 66.1 HEPARIN DW (KG): 74.4   Vital Signs: Temp: 97.6 F (36.4 C) (03/01 0759) Temp Source: Oral (03/01 0759) BP: 126/78 (03/01 0908) Pulse Rate: 69 (03/01 0908)  Labs: Recent Labs    07/29/17 1445 07/29/17 1908 07/29/17 1943 07/30/17 0230 07/30/17 1604 07/31/17 0330  HGB 13.2  --   --  13.4  --  11.3*  HCT 41.8  --   --  41.4  --  35.3*  PLT 262  --   --  243  --  230  APTT  --  33  --   --   --   --   LABPROT  --  12.6  --   --   --   --   INR  --  0.95  --   --   --   --   HEPARINUNFRC  --   --   --  0.17* 0.50 0.42  CREATININE 1.05  --   --  0.90  --  0.89  TROPONINI <0.03  --  <0.03 <0.03  --   --     Estimated Creatinine Clearance: 75.3 mL/min (by C-G formula based on SCr of 0.89 mg/dL).   Medical History: Past Medical History:  Diagnosis Date  . Arthritis   . Cancer ([place])   . Colon polyps   . Diabetes mellitus without complication ([place])   . Diverticulitis   . Hypertension   . IBS (irritable bowel syndrome)   . 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 Unstable angina ([place]) 07/2017    Assessment: 67 y.o. male with chest pain who remains therapeutic on heparin.  His hemoglobin is down this morning and his platelet count is stable.  No bleeding noted.  Goal of Therapy:  Heparin level 0.3-0.7 units/ml Monitor platelets by anticoagulation protocol: Yes   Plan:  Continue heparin infusion at 1000 units/hr Check anti-Xa level daily while on heparin Continue to monitor H&H and platelets Follow-up after cath  Thank you for allowing [place] to participate in this patients  care.  Legrand Como, Pharm.D., BCPS, BCIDP Clinical Pharmacist Phone: [phone] or [phone] 07/31/2017, 11:17 AM

## 2017-07-31 NOTE — H&P (View-Only) (Signed)
DAILY PROGRESS NOTE   Patient Name: Ronald Faulkner Date of Encounter: 07/31/2017  Chief Complaint   Slight left upper chest discomfort overnight  Patient Profile   Ronald Faulkner is a 67 y.o. male with a PMH of HTN, HLD, DM type 2, GERD, prostate CA s/p prostatectomy, and former smoker who is being seen today for the evaluation of chest pain at the request of Dr. Evangeline Gula.  Subjective   Slight left upper chest discomfort overnight. Labs stable. Troponin negative. Scheduled for cath today.  Objective   Vitals:   07/31/17 0300 07/31/17 0500 07/31/17 0759 07/31/17 0800  BP: 112/67   123/76  Pulse: 76   68  Resp: 16   15  Temp: 97.8 F (36.6 C)  97.6 F (36.4 C)   TempSrc: Oral  Oral   SpO2: 94%   96%  Weight:  164 lb 0.4 oz (74.4 kg)    Height:        Intake/Output Summary (Last 24 hours) at 07/31/2017 4098 Last data filed at 07/31/2017 1191 Gross per 24 hour  Intake 3360.87 ml  Output 300 ml  Net 3060.87 ml   Filed Weights   07/29/17 1422 07/30/17 1751 07/31/17 0500  Weight: 164 lb (74.4 kg) 164 lb 14.4 oz (74.8 kg) 164 lb 0.4 oz (74.4 kg)    Physical Exam   General appearance: alert and no distress Neck: no carotid bruit, no JVD and thyroid not enlarged, symmetric, no tenderness/mass/nodules Lungs: clear to auscultation bilaterally Heart: regular rate and rhythm, S1, S2 normal, no murmur, click, rub or gallop Abdomen: soft, non-tender; bowel sounds normal; no masses,  no organomegaly Extremities: extremities normal, atraumatic, no cyanosis or edema Pulses: 2+ and symmetric Skin: Skin color, texture, turgor normal. No rashes or lesions Neurologic: Grossly normal Psych: Pleasant  Inpatient Medications    Scheduled Meds: . allopurinol  300 mg Oral BID  . aspirin EC  81 mg Oral Daily  . atorvastatin  40 mg Oral q1800  . diphenhydrAMINE  50 mg Oral Once   Or  . diphenhydrAMINE  50 mg Intravenous Once  . famotidine  20 mg Oral Once  . irbesartan  300 mg Oral  Daily   And  . hydrochlorothiazide  12.5 mg Oral Daily  . insulin aspart  0-9 Units Subcutaneous TID WC  . metoprolol succinate  25 mg Oral Daily  . nitroGLYCERIN  1 inch Topical Q6H  . pantoprazole  40 mg Oral Daily  . predniSONE  50 mg Oral Q6H  . sodium chloride flush  3 mL Intravenous Q12H  . sodium chloride flush  3 mL Intravenous Q12H    Continuous Infusions: . sodium chloride    . 0.9 % NaCl with KCl 20 mEq / L 75 mL/hr at 07/30/17 2106  . sodium chloride 1 mL/kg/hr (07/31/17 0700)  . heparin 1,000 Units/hr (07/30/17 1652)    PRN Meds: sodium chloride, acetaminophen **OR** acetaminophen, linaclotide, morphine injection, nitroGLYCERIN, ondansetron **OR** ondansetron (ZOFRAN) IV, oxymetazoline, polyethylene glycol, sodium chloride flush, traMADol   Labs   Results for orders placed or performed during the hospital encounter of 07/29/17 (from the past 48 hour(s))  Hemoglobin A1c     Status: Abnormal   Collection Time: 07/29/17  2:40 PM  Result Value Ref Range   Hgb A1c MFr Bld 7.4 (H) 4.8 - 5.6 %    Comment: (NOTE) Pre diabetes:          5.7%-6.4% Diabetes:              >  6.4% Glycemic control for   <7.0% adults with diabetes    Mean Plasma Glucose 165.68 mg/dL    Comment: Performed at [facility] [address], [place], [place] [address]  Basic metabolic panel     Status: Abnormal   Collection Time: 07/29/17  2:45 PM  Result Value Ref Range   Sodium 138 135 - 145 mmol/L   Potassium 3.9 3.5 - 5.1 mmol/L   Chloride 104 101 - 111 mmol/L   CO2 21 (L) 22 - 32 mmol/L   Glucose, Bld 188 (H) 65 - 99 mg/dL   BUN 18 6 - 20 mg/dL   Creatinine, Ser 1.05 0.61 - 1.24 mg/dL   Calcium 9.6 8.9 - 10.3 mg/dL   GFR calc non Af Amer >60 >60 mL/min   GFR calc Af Amer >60 >60 mL/min    Comment: (NOTE) The eGFR has been calculated using the CKD EPI equation. This calculation has not been validated in all clinical situations. eGFR's persistently <60 mL/min signify possible  Chronic Kidney Disease.    Anion gap 13 5 - 15    Comment: Performed at [facility], [address], [place], [place] [address]  CBC     Status: None   Collection Time: 07/29/17  2:45 PM  Result Value Ref Range   WBC 6.3 4.0 - 10.5 K/uL   RBC 4.81 4.22 - 5.81 MIL/uL   Hemoglobin 13.2 13.0 - 17.0 g/dL   HCT 41.8 39.0 - 52.0 %   MCV 86.9 78.0 - 100.0 fL   MCH 27.4 26.0 - 34.0 pg   MCHC 31.6 30.0 - 36.0 g/dL   RDW 14.5 11.5 - 15.5 %   Platelets 262 150 - 400 K/uL    Comment: Performed at [facility], [address], [place], [place] [address]  Troponin I     Status: None   Collection Time: 07/29/17  2:45 PM  Result Value Ref Range   Troponin I <0.03 <0.03 ng/mL    Comment: Performed at [facility], [address] [place], [place] [address]  APTT     Status: None   Collection Time: 07/29/17  7:08 PM  Result Value Ref Range   aPTT 33 24 - 36 seconds    Comment: Performed at [facility], [address] [place], [place] [address]  Protime-INR     Status: None   Collection Time: 07/29/17  7:08 PM  Result Value Ref Range   Prothrombin Time 12.6 11.4 - 15.2 seconds   INR 0.95     Comment: Performed at [facility], [address], [place], [place] [address]  Troponin I     Status: None   Collection Time: 07/29/17  7:43 PM  Result Value Ref Range   Troponin I <0.03 <0.03 ng/mL    Comment: Performed at [facility], [address] [place], [place] [address]  Heparin level (unfractionated)     Status: Abnormal   Collection Time: 07/30/17  2:30 AM  Result Value Ref Range   Heparin Unfractionated 0.17 (L) 0.30 - 0.70 IU/mL    Comment:        IF HEPARIN RESULTS ARE BELOW EXPECTED VALUES, AND PATIENT DOSAGE HAS BEEN CONFIRMED, SUGGEST FOLLOW UP TESTING OF ANTITHROMBIN III LEVELS. Performed at [facility], [address] [place], [place] [address]   CBC     Status: None   Collection Time: 07/30/17  2:30 AM  Result Value Ref Range   WBC 5.5 4.0 - 10.5 K/uL  RBC 4.78 4.22 - 5.81  MIL/uL   Hemoglobin 13.4 13.0 - 17.0 g/dL   HCT 41.4 39.0 - 52.0 %   MCV 86.6 78.0 - 100.0 fL   MCH 28.0 26.0 - 34.0 pg   MCHC 32.4 30.0 - 36.0 g/dL   RDW 14.5 11.5 - 15.5 %   Platelets 243 150 - 400 K/uL    Comment: Performed at [facility], [address], [place], [place] [address]  Troponin I     Status: None   Collection Time: 07/30/17  2:30 AM  Result Value Ref Range   Troponin I <0.03 <0.03 ng/mL    Comment: Performed at [facility], [address] [place], [place] [address]  Basic metabolic panel     Status: Abnormal   Collection Time: 07/30/17  2:30 AM  Result Value Ref Range   Sodium 139 135 - 145 mmol/L   Potassium 3.4 (L) 3.5 - 5.1 mmol/L   Chloride 104 101 - 111 mmol/L   CO2 23 22 - 32 mmol/L   Glucose, Bld 209 (H) 65 - 99 mg/dL   BUN 18 6 - 20 mg/dL   Creatinine, Ser 0.90 0.61 - 1.24 mg/dL   Calcium 9.3 8.9 - 10.3 mg/dL   GFR calc non Af Amer >60 >60 mL/min   GFR calc Af Amer >60 >60 mL/min    Comment: (NOTE) The eGFR has been calculated using the CKD EPI equation. This calculation has not been validated in all clinical situations. eGFR's persistently <60 mL/min signify possible Chronic Kidney Disease.    Anion gap 12 5 - 15    Comment: Performed at [facility], [address], [place], [place] [address]  CBG monitoring, ED     Status: Abnormal   Collection Time: 07/30/17  7:34 AM  Result Value Ref Range   Glucose-Capillary 157 (H) 65 - 99 mg/dL  MRSA PCR Screening     Status: None   Collection Time: 07/30/17 10:15 AM  Result Value Ref Range   MRSA by PCR NEGATIVE NEGATIVE    Comment:        The GeneXpert MRSA Assay (FDA approved for NASAL specimens only), is one component of a comprehensive MRSA colonization surveillance program. It is not intended to diagnose MRSA infection nor to guide or monitor treatment for MRSA infections. Performed at [facility], [place] [address] [place], [place] [address]   Glucose, capillary     Status: Abnormal     Collection Time: 07/30/17 12:22 PM  Result Value Ref Range   Glucose-Capillary 112 (H) 65 - 99 mg/dL  Heparin level (unfractionated)     Status: None   Collection Time: 07/30/17  4:04 PM  Result Value Ref Range   Heparin Unfractionated 0.50 0.30 - 0.70 IU/mL    Comment:        IF HEPARIN RESULTS ARE BELOW EXPECTED VALUES, AND PATIENT DOSAGE HAS BEEN CONFIRMED, SUGGEST FOLLOW UP TESTING OF ANTITHROMBIN III LEVELS. Performed at [facility], [place] [address], [place], [place] [address]   Glucose, capillary     Status: Abnormal   Collection Time: 07/30/17  4:59 PM  Result Value Ref Range   Glucose-Capillary 187 (H) 65 - 99 mg/dL  Heparin level (unfractionated)     Status: None   Collection Time: 07/31/17  3:30 AM  Result Value Ref Range   Heparin Unfractionated 0.42 0.30 - 0.70 IU/mL    Comment:        IF HEPARIN RESULTS  ARE BELOW EXPECTED VALUES, AND PATIENT DOSAGE HAS BEEN CONFIRMED, SUGGEST FOLLOW UP TESTING OF ANTITHROMBIN III LEVELS. Performed at [facility], [place] [address], [place], [place] [address]   CBC     Status: Abnormal   Collection Time: 07/31/17  3:30 AM  Result Value Ref Range   WBC 6.4 4.0 - 10.5 K/uL   RBC 4.13 (L) 4.22 - 5.81 MIL/uL   Hemoglobin 11.3 (L) 13.0 - 17.0 g/dL   HCT 35.3 (L) 39.0 - 52.0 %   MCV 85.5 78.0 - 100.0 fL   MCH 27.4 26.0 - 34.0 pg   MCHC 32.0 30.0 - 36.0 g/dL   RDW 14.4 11.5 - 15.5 %   Platelets 230 150 - 400 K/uL    Comment: Performed at [facility], [place] [address], [place], [place] [address]  Basic metabolic panel     Status: Abnormal   Collection Time: 07/31/17  3:30 AM  Result Value Ref Range   Sodium 140 135 - 145 mmol/L   Potassium 3.8 3.5 - 5.1 mmol/L   Chloride 108 101 - 111 mmol/L   CO2 22 22 - 32 mmol/L   Glucose, Bld 129 (H) 65 - 99 mg/dL   BUN 13 6 - 20 mg/dL   Creatinine, Ser 0.89 0.61 - 1.24 mg/dL   Calcium 8.8 (L) 8.9 - 10.3 mg/dL   GFR calc non Af Amer >60 >60 mL/min   GFR calc Af Amer  >60 >60 mL/min    Comment: (NOTE) The eGFR has been calculated using the CKD EPI equation. This calculation has not been validated in all clinical situations. eGFR's persistently <60 mL/min signify possible Chronic Kidney Disease.    Anion gap 10 5 - 15    Comment: Performed at [facility] [address], [place], [place] [address]  Magnesium     Status: None   Collection Time: 07/31/17  3:30 AM  Result Value Ref Range   Magnesium 1.8 1.7 - 2.4 mg/dL    Comment: Performed at [facility] [address] [place], [place] [address]  Hemoglobin A1c     Status: Abnormal   Collection Time: 07/31/17  3:30 AM  Result Value Ref Range   Hgb A1c MFr Bld 7.4 (H) 4.8 - 5.6 %    Comment: (NOTE) Pre diabetes:          5.7%-6.4% Diabetes:              >6.4% Glycemic control for   <7.0% adults with diabetes    Mean Plasma Glucose 165.68 mg/dL    Comment: Performed at [facility] [address] [place], [place] [address]  Glucose, capillary     Status: Abnormal   Collection Time: 07/31/17  8:05 AM  Result Value Ref Range   Glucose-Capillary 149 (H) 65 - 99 mg/dL    ECG   N/A  Telemetry   Sinus rhythm - Personally Reviewed  Radiology    Dg Chest 2 View  Result Date: 07/29/2017 CLINICAL DATA:  Mid to left-sided chest pain intermittent for 2 months. History of diabetes and hypertension. EXAM: CHEST  2 VIEW COMPARISON:  Radiographs 05/26/2017. FINDINGS: The heart size and mediastinal contours are stable. There are linear opacities at both lung bases, similar to previous study, and most consistent with scarring. There is no edema, confluent airspace opacity, pleural effusion or pneumothorax. Mild degenerative changes are present throughout the thoracic spine. IMPRESSION: Stable chest with bibasilar scarring.  No acute cardiopulmonary process. Electronically Signed   By: Richardean Sale M.D.   On: 07/29/2017 17:47    Cardiac Studies   N/A  Assessment    1. Principal Problem: 2.   Unstable angina ([place]) 3. Active Problems: 4.   HTN (hypertension) 5.   GERD (gastroesophageal reflux disease) 6.   SOB (shortness of breath) 7.   Type 2 diabetes mellitus ([place]) 8.   Plan   1. Left upper chest pain which was mild overnight- troponins negative. Recent symptoms concerning for unstable angina. Plan [facility] today.  Time Spent Directly with Patient:  I have spent a total of 15 minutes with the patient reviewing hospital notes, telemetry, EKGs, labs and examining the patient as well as establishing an assessment and plan that was discussed personally with the patient. > 50% of time was spent in direct patient care.  Length of Stay:  LOS: 2 days   Pixie Casino, MD, [facility], [place] Director of the [facility] Lipid Disorders &  Cardiovascular Risk Reduction Clinic Diplomate of the American Board of Clinical Lipidology Attending Cardiologist  Direct Dial: [phone]  Fax: [phone]  Website:  [email or web address] [email or web address] Annabeth Tortora 07/31/2017, 8:28 AM

## 2017-07-31 NOTE — Interval H&P Note (Signed)
History and Physical Interval Note:  07/31/2017 12:47 PM  Ronald Faulkner  has presented today for cardiac cath with the diagnosis of unstable angina. The various methods of treatment have been discussed with the patient and family. After consideration of risks, benefits and other options for treatment, the patient has consented to  Procedure(s): LEFT HEART CATH AND CORONARY ANGIOGRAPHY (N/A) as a surgical intervention .  The patient's history has been reviewed, patient examined, no change in status, stable for surgery.  I have reviewed the patient's chart and labs.  Questions were answered to the patient's satisfaction.    Cath Lab Visit (complete for each Cath Lab visit)  Clinical Evaluation Leading to the Procedure:   ACS: No.  Non-ACS:    Anginal Classification: CCS III  Anti-ischemic medical therapy: Minimal Therapy (1 class of medications)  Non-Invasive Test Results: No non-invasive testing performed  Prior CABG: No previous CABG         Lauree Chandler

## 2017-07-31 NOTE — Progress Notes (Signed)
Patient ID: Ronald Faulkner, male   DOB: 1950/06/20, 67 y.o.   MRN: [id number]  PROGRESS NOTE    JOSPH NORFLEET  GLO:[id number] DOB: [birth year] DOA: 07/29/2017 PCP: [name], DO   Brief Narrative:  67 year old male with history of diabetes, hypertension, hyperlipidemia, former smoker presented on 07/29/2017 to [facility] for chest pain progressively worsening for 2 months. Patient was admitted with unstable angina and started on heparin drip and transferred to [facility] as per cardiology recommendations.  Assessment & Plan:   Principal Problem:   Unstable angina (HCC) Active Problems:   HTN (hypertension)   GERD (gastroesophageal reflux disease)   SOB (shortness of breath)   Type 2 diabetes mellitus ([place])   1.  Unstable angina:  - Currently on heparin drip.  -Cardiology following.  Plan for cath today.  He currently complains of mild neck pain - Continue aspirin, statin and metoprolol  -Echo shows EF of 50-55% with grade 1 diastolic dysfunction -N.p.o. for now  2.  Hypertension:  - Stable. Continue metoprolol, hydrochlorothiazide and irbesartan  3.  Type 2 diabetes mellitus: - Hold metformin. Accu-Cheks with coverage.  -Hemoglobin A1c 7.4  4.  Gastroesophageal reflux disease: Continue omeprazole.  5. Hypokalemia -Improving.  Continue replacement in IV fluids. Repeat a.m. labs   DVT prophylaxis: Heparin drip Code Status:  Full Family Communication: None at bedside Disposition Plan: Home in 1-2 days once cleared by cardiology  Consultants: Cardiology  Procedures:  Echo on 07/30/2017 Study Conclusions  - Left ventricle: The cavity size was normal. Systolic function was   at the lower limits of normal. The estimated ejection fraction   was in the range of 50% to 55%. Wall motion was normal; there   were no regional wall motion abnormalities. Doppler parameters   are consistent with abnormal left ventricular relaxation (grade 1   diastolic  dysfunction).   Antimicrobials: None    Subjective: Patient seen and examined at bedside. he complains of slight neck pain. No overnight fever or vomiting or shortness of breath  Objective: Vitals:   07/31/17 0300 07/31/17 0500 07/31/17 0759 07/31/17 0800  BP: 112/67   123/76  Pulse: 76   68  Resp: 16   15  Temp: 97.8 F (36.6 C)  97.6 F (36.4 C)   TempSrc: Oral  Oral   SpO2: 94%   96%  Weight:  74.4 kg (164 lb 0.4 oz)    Height:        Intake/Output Summary (Last 24 hours) at 07/31/2017 0855 Last data filed at 07/31/2017 0646 Gross per 24 hour  Intake 3360.87 ml  Output 300 ml  Net 3060.87 ml   Filed Weights   07/29/17 1422 07/30/17 1751 07/31/17 0500  Weight: 74.4 kg (164 lb) 74.8 kg (164 lb 14.4 oz) 74.4 kg (164 lb 0.4 oz)    Examination:  General exam: No distress.   Respiratory system: Bilateral decreased breath sounds at bases Cardiovascular system: S1-S2 heard, rate controlled  Gastrointestinal system: Abdomen is nondistended, soft and nontender. Normal bowel sounds heard. Extremities: No cyanosis, clubbing, edema   Data Reviewed: I have personally reviewed following labs and imaging studies  CBC: Recent Labs  Lab 07/29/17 1445 07/30/17 0230 07/31/17 0330  WBC 6.3 5.5 6.4  HGB 13.2 13.4 11.3*  HCT 41.8 41.4 35.3*  MCV 86.9 86.6 85.5  PLT 262 243 188   Basic Metabolic Panel: Recent Labs  Lab 07/29/17 1445 07/30/17 0230 07/31/17 0330  NA 138 139 140  K 3.9 3.4* 3.8  CL 104 104 108  CO2 21* 23 22  GLUCOSE 188* 209* 129*  BUN 18 18 13   CREATININE 1.05 0.90 0.89  CALCIUM 9.6 9.3 8.8*  MG  --   --  1.8   GFR: Estimated Creatinine Clearance: 75.3 mL/min (by C-G formula based on SCr of 0.89 mg/dL). Liver Function Tests: No results for input(s): AST, ALT, ALKPHOS, BILITOT, PROT, ALBUMIN in the last 168 hours. No results for input(s): LIPASE, AMYLASE in the last 168 hours. No results for input(s): AMMONIA in the last 168 hours. Coagulation  Profile: Recent Labs  Lab 07/29/17 1908  INR 0.95   Cardiac Enzymes: Recent Labs  Lab 07/29/17 1445 07/29/17 1943 07/30/17 0230  TROPONINI <0.03 <0.03 <0.03   BNP (last 3 results) No results for input(s): PROBNP in the last 8760 hours. HbA1C: Recent Labs    07/29/17 1440 07/31/17 0330  HGBA1C 7.4* 7.4*   CBG: Recent Labs  Lab 07/30/17 0734 07/30/17 1222 07/30/17 1659 07/31/17 0805  GLUCAP 157* 112* 187* 149*   Lipid Profile: No results for input(s): CHOL, HDL, LDLCALC, TRIG, CHOLHDL, LDLDIRECT in the last 72 hours. Thyroid Function Tests: No results for input(s): TSH, T4TOTAL, FREET4, T3FREE, THYROIDAB in the last 72 hours. Anemia Panel: No results for input(s): VITAMINB12, FOLATE, FERRITIN, TIBC, IRON, RETICCTPCT in the last 72 hours. Sepsis Labs: No results for input(s): PROCALCITON, LATICACIDVEN in the last 168 hours.  Recent Results (from the past 240 hour(s))  MRSA PCR Screening     Status: None   Collection Time: 07/30/17 10:15 AM  Result Value Ref Range Status   MRSA by PCR NEGATIVE NEGATIVE Final    Comment:        The GeneXpert MRSA Assay (FDA approved for NASAL specimens only), is one component of a comprehensive MRSA colonization surveillance program. It is not intended to diagnose MRSA infection nor to guide or monitor treatment for MRSA infections. Performed at [facility], [place] [address] [place], [place] [address]          Radiology Studies: Dg Chest 2 View  Result Date: 07/29/2017 CLINICAL DATA:  Mid to left-sided chest pain intermittent for 2 months. History of diabetes and hypertension. EXAM: CHEST  2 VIEW COMPARISON:  Radiographs 05/26/2017. FINDINGS: The heart size and mediastinal contours are stable. There are linear opacities at both lung bases, similar to previous study, and most consistent with scarring. There is no edema, confluent airspace opacity, pleural effusion or pneumothorax. Mild degenerative changes are  present throughout the thoracic spine. IMPRESSION: Stable chest with bibasilar scarring. No acute cardiopulmonary process. Electronically Signed   By: Richardean Sale M.D.   On: 07/29/2017 17:47        Scheduled Meds: . allopurinol  300 mg Oral BID  . aspirin EC  81 mg Oral Daily  . atorvastatin  40 mg Oral q1800  . diphenhydrAMINE  50 mg Oral Once   Or  . diphenhydrAMINE  50 mg Intravenous Once  . famotidine  20 mg Oral Once  . irbesartan  300 mg Oral Daily   And  . hydrochlorothiazide  12.5 mg Oral Daily  . insulin aspart  0-9 Units Subcutaneous TID WC  . metoprolol succinate  25 mg Oral Daily  . nitroGLYCERIN  1 inch Topical Q6H  . pantoprazole  40 mg Oral Daily  . predniSONE  50 mg Oral Q6H  . sodium chloride flush  3 mL Intravenous Q12H  . sodium chloride flush  3 mL Intravenous Q12H   Continuous Infusions: . sodium chloride    . 0.9 % NaCl with KCl 20 mEq / L 75 mL/hr at 07/30/17 2106  . sodium chloride 1 mL/kg/hr (07/31/17 0700)  . heparin 1,000 Units/hr (07/30/17 1652)     LOS: 2 days        Aline August, MD [facility] Pager [phone]  If 7PM-7AM, please contact night-coverage [email or web address] Password [facility] 07/31/2017, 8:55 AM

## 2017-07-31 NOTE — Progress Notes (Signed)
DAILY PROGRESS NOTE   Patient Name: Ronald Faulkner Date of Encounter: 07/31/2017  Chief Complaint   Slight left upper chest discomfort overnight  Patient Profile   Ronald Faulkner is a 67 y.o. male with a PMH of HTN, HLD, DM type 2, GERD, prostate CA s/p prostatectomy, and former smoker who is being seen today for the evaluation of chest pain at the request of Dr. Evangeline Gula.  Subjective   Slight left upper chest discomfort overnight. Labs stable. Troponin negative. Scheduled for cath today.  Objective   Vitals:   07/31/17 0300 07/31/17 0500 07/31/17 0759 07/31/17 0800  BP: 112/67   123/76  Pulse: 76   68  Resp: 16   15  Temp: 97.8 F (36.6 C)  97.6 F (36.4 C)   TempSrc: Oral  Oral   SpO2: 94%   96%  Weight:  164 lb 0.4 oz (74.4 kg)    Height:        Intake/Output Summary (Last 24 hours) at 07/31/2017 7619 Last data filed at 07/31/2017 5093 Gross per 24 hour  Intake 3360.87 ml  Output 300 ml  Net 3060.87 ml   Filed Weights   07/29/17 1422 07/30/17 1751 07/31/17 0500  Weight: 164 lb (74.4 kg) 164 lb 14.4 oz (74.8 kg) 164 lb 0.4 oz (74.4 kg)    Physical Exam   General appearance: alert and no distress Neck: no carotid bruit, no JVD and thyroid not enlarged, symmetric, no tenderness/mass/nodules Lungs: clear to auscultation bilaterally Heart: regular rate and rhythm, S1, S2 normal, no murmur, click, rub or gallop Abdomen: soft, non-tender; bowel sounds normal; no masses,  no organomegaly Extremities: extremities normal, atraumatic, no cyanosis or edema Pulses: 2+ and symmetric Skin: Skin color, texture, turgor normal. No rashes or lesions Neurologic: Grossly normal Psych: Pleasant  Inpatient Medications    Scheduled Meds: . allopurinol  300 mg Oral BID  . aspirin EC  81 mg Oral Daily  . atorvastatin  40 mg Oral q1800  . diphenhydrAMINE  50 mg Oral Once   Or  . diphenhydrAMINE  50 mg Intravenous Once  . famotidine  20 mg Oral Once  . irbesartan  300 mg Oral  Daily   And  . hydrochlorothiazide  12.5 mg Oral Daily  . insulin aspart  0-9 Units Subcutaneous TID WC  . metoprolol succinate  25 mg Oral Daily  . nitroGLYCERIN  1 inch Topical Q6H  . pantoprazole  40 mg Oral Daily  . predniSONE  50 mg Oral Q6H  . sodium chloride flush  3 mL Intravenous Q12H  . sodium chloride flush  3 mL Intravenous Q12H    Continuous Infusions: . sodium chloride    . 0.9 % NaCl with KCl 20 mEq / L 75 mL/hr at 07/30/17 2106  . sodium chloride 1 mL/kg/hr (07/31/17 0700)  . heparin 1,000 Units/hr (07/30/17 1652)    PRN Meds: sodium chloride, acetaminophen **OR** acetaminophen, linaclotide, morphine injection, nitroGLYCERIN, ondansetron **OR** ondansetron (ZOFRAN) IV, oxymetazoline, polyethylene glycol, sodium chloride flush, traMADol   Labs   Results for orders placed or performed during the hospital encounter of 07/29/17 (from the past 48 hour(s))  Hemoglobin A1c     Status: Abnormal   Collection Time: 07/29/17  2:40 PM  Result Value Ref Range   Hgb A1c MFr Bld 7.4 (H) 4.8 - 5.6 %    Comment: (NOTE) Pre diabetes:          5.7%-6.4% Diabetes:              >  6.4% Glycemic control for   <7.0% adults with diabetes    Mean Plasma Glucose 165.68 mg/dL    Comment: Performed at [facility] [address] [place], [place] [address]  Basic metabolic panel     Status: Abnormal   Collection Time: 07/29/17  2:45 PM  Result Value Ref Range   Sodium 138 135 - 145 mmol/L   Potassium 3.9 3.5 - 5.1 mmol/L   Chloride 104 101 - 111 mmol/L   CO2 21 (L) 22 - 32 mmol/L   Glucose, Bld 188 (H) 65 - 99 mg/dL   BUN 18 6 - 20 mg/dL   Creatinine, Ser 1.05 0.61 - 1.24 mg/dL   Calcium 9.6 8.9 - 10.3 mg/dL   GFR calc non Af Amer >60 >60 mL/min   GFR calc Af Amer >60 >60 mL/min    Comment: (NOTE) The eGFR has been calculated using the CKD EPI equation. This calculation has not been validated in all clinical situations. eGFR's persistently <60 mL/min signify possible  Chronic Kidney Disease.    Anion gap 13 5 - 15    Comment: Performed at [facility], [address] [place], [place] [address]  CBC     Status: None   Collection Time: 07/29/17  2:45 PM  Result Value Ref Range   WBC 6.3 4.0 - 10.5 K/uL   RBC 4.81 4.22 - 5.81 MIL/uL   Hemoglobin 13.2 13.0 - 17.0 g/dL   HCT 41.8 39.0 - 52.0 %   MCV 86.9 78.0 - 100.0 fL   MCH 27.4 26.0 - 34.0 pg   MCHC 31.6 30.0 - 36.0 g/dL   RDW 14.5 11.5 - 15.5 %   Platelets 262 150 - 400 K/uL    Comment: Performed at [facility], [address], [place], [place] [address]  Troponin I     Status: None   Collection Time: 07/29/17  2:45 PM  Result Value Ref Range   Troponin I <0.03 <0.03 ng/mL    Comment: Performed at [facility], [address] [place], [place] [address]  APTT     Status: None   Collection Time: 07/29/17  7:08 PM  Result Value Ref Range   aPTT 33 24 - 36 seconds    Comment: Performed at [facility], [address], [place], [place] [address]  Protime-INR     Status: None   Collection Time: 07/29/17  7:08 PM  Result Value Ref Range   Prothrombin Time 12.6 11.4 - 15.2 seconds   INR 0.95     Comment: Performed at [facility], [address], [place], [place] [address]  Troponin I     Status: None   Collection Time: 07/29/17  7:43 PM  Result Value Ref Range   Troponin I <0.03 <0.03 ng/mL    Comment: Performed at [facility], [address], [place], [place] [address]  Heparin level (unfractionated)     Status: Abnormal   Collection Time: 07/30/17  2:30 AM  Result Value Ref Range   Heparin Unfractionated 0.17 (L) 0.30 - 0.70 IU/mL    Comment:        IF HEPARIN RESULTS ARE BELOW EXPECTED VALUES, AND PATIENT DOSAGE HAS BEEN CONFIRMED, SUGGEST FOLLOW UP TESTING OF ANTITHROMBIN III LEVELS. Performed at [facility], [address], [place], [place] [address]   CBC     Status: None   Collection Time: 07/30/17  2:30 AM  Result Value Ref Range   WBC 5.5 4.0 - 10.5 K/uL  RBC 4.78 4.22 - 5.81  MIL/uL   Hemoglobin 13.4 13.0 - 17.0 g/dL   HCT 41.4 39.0 - 52.0 %   MCV 86.6 78.0 - 100.0 fL   MCH 28.0 26.0 - 34.0 pg   MCHC 32.4 30.0 - 36.0 g/dL   RDW 14.5 11.5 - 15.5 %   Platelets 243 150 - 400 K/uL    Comment: Performed at [facility], [address], [place], [place] [address]  Troponin I     Status: None   Collection Time: 07/30/17  2:30 AM  Result Value Ref Range   Troponin I <0.03 <0.03 ng/mL    Comment: Performed at [facility], [address], [place], [place] [address]  Basic metabolic panel     Status: Abnormal   Collection Time: 07/30/17  2:30 AM  Result Value Ref Range   Sodium 139 135 - 145 mmol/L   Potassium 3.4 (L) 3.5 - 5.1 mmol/L   Chloride 104 101 - 111 mmol/L   CO2 23 22 - 32 mmol/L   Glucose, Bld 209 (H) 65 - 99 mg/dL   BUN 18 6 - 20 mg/dL   Creatinine, Ser 0.90 0.61 - 1.24 mg/dL   Calcium 9.3 8.9 - 10.3 mg/dL   GFR calc non Af Amer >60 >60 mL/min   GFR calc Af Amer >60 >60 mL/min    Comment: (NOTE) The eGFR has been calculated using the CKD EPI equation. This calculation has not been validated in all clinical situations. eGFR's persistently <60 mL/min signify possible Chronic Kidney Disease.    Anion gap 12 5 - 15    Comment: Performed at [facility], [address] [place], [place] [address]  CBG monitoring, ED     Status: Abnormal   Collection Time: 07/30/17  7:34 AM  Result Value Ref Range   Glucose-Capillary 157 (H) 65 - 99 mg/dL  MRSA PCR Screening     Status: None   Collection Time: 07/30/17 10:15 AM  Result Value Ref Range   MRSA by PCR NEGATIVE NEGATIVE    Comment:        The GeneXpert MRSA Assay (FDA approved for NASAL specimens only), is one component of a comprehensive MRSA colonization surveillance program. It is not intended to diagnose MRSA infection nor to guide or monitor treatment for MRSA infections. Performed at [facility], [place] [address], [place], [place] [address]   Glucose, capillary     Status: Abnormal     Collection Time: 07/30/17 12:22 PM  Result Value Ref Range   Glucose-Capillary 112 (H) 65 - 99 mg/dL  Heparin level (unfractionated)     Status: None   Collection Time: 07/30/17  4:04 PM  Result Value Ref Range   Heparin Unfractionated 0.50 0.30 - 0.70 IU/mL    Comment:        IF HEPARIN RESULTS ARE BELOW EXPECTED VALUES, AND PATIENT DOSAGE HAS BEEN CONFIRMED, SUGGEST FOLLOW UP TESTING OF ANTITHROMBIN III LEVELS. Performed at [facility], [place] [address] [place], [place] [address]   Glucose, capillary     Status: Abnormal   Collection Time: 07/30/17  4:59 PM  Result Value Ref Range   Glucose-Capillary 187 (H) 65 - 99 mg/dL  Heparin level (unfractionated)     Status: None   Collection Time: 07/31/17  3:30 AM  Result Value Ref Range   Heparin Unfractionated 0.42 0.30 - 0.70 IU/mL    Comment:        IF HEPARIN RESULTS  ARE BELOW EXPECTED VALUES, AND PATIENT DOSAGE HAS BEEN CONFIRMED, SUGGEST FOLLOW UP TESTING OF ANTITHROMBIN III LEVELS. Performed at [facility], [place] [address] [place], [place] [address]   CBC     Status: Abnormal   Collection Time: 07/31/17  3:30 AM  Result Value Ref Range   WBC 6.4 4.0 - 10.5 K/uL   RBC 4.13 (L) 4.22 - 5.81 MIL/uL   Hemoglobin 11.3 (L) 13.0 - 17.0 g/dL   HCT 35.3 (L) 39.0 - 52.0 %   MCV 85.5 78.0 - 100.0 fL   MCH 27.4 26.0 - 34.0 pg   MCHC 32.0 30.0 - 36.0 g/dL   RDW 14.4 11.5 - 15.5 %   Platelets 230 150 - 400 K/uL    Comment: Performed at [facility], [place] [address], [place], [place] [address]  Basic metabolic panel     Status: Abnormal   Collection Time: 07/31/17  3:30 AM  Result Value Ref Range   Sodium 140 135 - 145 mmol/L   Potassium 3.8 3.5 - 5.1 mmol/L   Chloride 108 101 - 111 mmol/L   CO2 22 22 - 32 mmol/L   Glucose, Bld 129 (H) 65 - 99 mg/dL   BUN 13 6 - 20 mg/dL   Creatinine, Ser 0.89 0.61 - 1.24 mg/dL   Calcium 8.8 (L) 8.9 - 10.3 mg/dL   GFR calc non Af Amer >60 >60 mL/min   GFR calc Af Amer  >60 >60 mL/min    Comment: (NOTE) The eGFR has been calculated using the CKD EPI equation. This calculation has not been validated in all clinical situations. eGFR's persistently <60 mL/min signify possible Chronic Kidney Disease.    Anion gap 10 5 - 15    Comment: Performed at [facility] [address] [place], [place] [address]  Magnesium     Status: None   Collection Time: 07/31/17  3:30 AM  Result Value Ref Range   Magnesium 1.8 1.7 - 2.4 mg/dL    Comment: Performed at [name] [address] [place], [place] [address]  Hemoglobin A1c     Status: Abnormal   Collection Time: 07/31/17  3:30 AM  Result Value Ref Range   Hgb A1c MFr Bld 7.4 (H) 4.8 - 5.6 %    Comment: (NOTE) Pre diabetes:          5.7%-6.4% Diabetes:              >6.4% Glycemic control for   <7.0% adults with diabetes    Mean Plasma Glucose 165.68 mg/dL    Comment: Performed at [facility] [address], [place], [place] [address]  Glucose, capillary     Status: Abnormal   Collection Time: 07/31/17  8:05 AM  Result Value Ref Range   Glucose-Capillary 149 (H) 65 - 99 mg/dL    ECG   N/A  Telemetry   Sinus rhythm - Personally Reviewed  Radiology    Dg Chest 2 View  Result Date: 07/29/2017 CLINICAL DATA:  Mid to left-sided chest pain intermittent for 2 months. History of diabetes and hypertension. EXAM: CHEST  2 VIEW COMPARISON:  Radiographs 05/26/2017. FINDINGS: The heart size and mediastinal contours are stable. There are linear opacities at both lung bases, similar to previous study, and most consistent with scarring. There is no edema, confluent airspace opacity, pleural effusion or pneumothorax. Mild degenerative changes are present throughout the thoracic spine. IMPRESSION: Stable chest with bibasilar scarring.  No acute cardiopulmonary process. Electronically Signed   By: Richardean Sale M.D.   On: 07/29/2017 17:47    Cardiac Studies   N/A  Assessment    1. Principal Problem: 2.   Unstable angina ([place]) 3. Active Problems: 4.   HTN (hypertension) 5.   GERD (gastroesophageal reflux disease) 6.   SOB (shortness of breath) 7.   Type 2 diabetes mellitus ([place]) 8.   Plan   1. Left upper chest pain which was mild overnight- troponins negative. Recent symptoms concerning for unstable angina. Plan LHC today.  Time Spent Directly with Patient:  I have spent a total of 15 minutes with the patient reviewing hospital notes, telemetry, EKGs, labs and examining the patient as well as establishing an assessment and plan that was discussed personally with the patient. > 50% of time was spent in direct patient care.  Length of Stay:  LOS: 2 days   Pixie Casino, MD, [facility], [place] Director of the Advanced Lipid Disorders &  Cardiovascular Risk Reduction Clinic Diplomate of the American Board of Clinical Lipidology Attending Cardiologist  Direct Dial: [phone]  Fax: [phone]  Website:  [email or web address] 07/31/2017, 8:28 AM

## 2017-07-31 NOTE — Progress Notes (Signed)
TR BAND REMOVAL  LOCATION:    right radial  DEFLATED PER PROTOCOL:    Yes.    TIME BAND OFF / DRESSING APPLIED:    1900   SITE UPON ARRIVAL:    Level 0  SITE AFTER BAND REMOVAL:    Level 0  CIRCULATION SENSATION AND MOVEMENT:    Within Normal Limits   Yes.    COMMENTS:   Rechecked at [place] with no change in assessment

## 2017-08-01 DIAGNOSIS — Z955 Presence of coronary angioplasty implant and graft: Secondary | ICD-10-CM

## 2017-08-01 DIAGNOSIS — I25110 Atherosclerotic heart disease of native coronary artery with unstable angina pectoris: Principal | ICD-10-CM

## 2017-08-01 LAB — BASIC METABOLIC PANEL
Anion gap: 8 (ref 5–15)
BUN: 13 mg/dL (ref 6–20)
CHLORIDE: 109 mmol/L (ref 101–111)
CO2: 21 mmol/L — ABNORMAL LOW (ref 22–32)
CREATININE: 0.95 mg/dL (ref 0.61–1.24)
Calcium: 8.9 mg/dL (ref 8.9–10.3)
Glucose, Bld: 151 mg/dL — ABNORMAL HIGH (ref 65–99)
Potassium: 3.7 mmol/L (ref 3.5–5.1)
SODIUM: 138 mmol/L (ref 135–145)

## 2017-08-01 LAB — CBC
HEMATOCRIT: 37 % — AB (ref 39.0–52.0)
Hemoglobin: 12.2 g/dL — ABNORMAL LOW (ref 13.0–17.0)
MCH: 27.9 pg (ref 26.0–34.0)
MCHC: 33 g/dL (ref 30.0–36.0)
MCV: 84.7 fL (ref 78.0–100.0)
PLATELETS: 223 10*3/uL (ref 150–400)
RBC: 4.37 MIL/uL (ref 4.22–5.81)
RDW: 14.5 % (ref 11.5–15.5)
WBC: 9.7 10*3/uL (ref 4.0–10.5)

## 2017-08-01 LAB — GLUCOSE, CAPILLARY: GLUCOSE-CAPILLARY: 132 mg/dL — AB (ref 65–99)

## 2017-08-01 LAB — MAGNESIUM: MAGNESIUM: 1.9 mg/dL (ref 1.7–2.4)

## 2017-08-01 MED ORDER — METOPROLOL SUCCINATE ER 25 MG PO TB24: 25.0000 mg | ORAL_TABLET | Freq: Every day | ORAL | 6 refills | Status: DC

## 2017-08-01 MED ORDER — METFORMIN HCL 500 MG PO TABS: 500.0000 mg | ORAL_TABLET | Freq: Two times a day (BID) | ORAL | Status: DC

## 2017-08-01 MED ORDER — ANGIOPLASTY BOOK
Freq: Once | Status: AC
  Administered 2017-08-01: 02:00:00 1
  Filled 2017-08-01: qty 1

## 2017-08-01 MED ORDER — NITROGLYCERIN 0.4 MG SL SUBL: 0.4000 mg | SUBLINGUAL_TABLET | SUBLINGUAL | 4 refills | Status: DC | PRN

## 2017-08-01 MED ORDER — CLOPIDOGREL BISULFATE 75 MG PO TABS: 75.0000 mg | ORAL_TABLET | Freq: Every day | ORAL | 11 refills | Status: DC

## 2017-08-01 MED ORDER — ISOSORBIDE MONONITRATE ER 30 MG PO TB24
30.0000 mg | ORAL_TABLET | Freq: Every day | ORAL | Status: DC
  Administered 2017-08-01: 30 mg via ORAL
  Filled 2017-08-01: qty 1

## 2017-08-01 MED ORDER — ISOSORBIDE MONONITRATE ER 30 MG PO TB24: 30.0000 mg | ORAL_TABLET | Freq: Every day | ORAL | 6 refills | Status: DC

## 2017-08-01 MED ORDER — PANTOPRAZOLE SODIUM 40 MG PO TBEC: 40.0000 mg | DELAYED_RELEASE_TABLET | Freq: Every day | ORAL | 6 refills | Status: DC

## 2017-08-01 NOTE — Discharge Instructions (Signed)
Call [facility] at [phone] if any bleeding, swelling or drainage at cath site.  May shower, no tub baths for 48 hours for groin sticks. No lifting over 5 pounds for 3 days.  No Driving for 3 days.    Heart Healthy Diet  Take 1 NTG, under your tongue, while sitting.  If no relief of pain may repeat NTG, one tab every 5 minutes up to 3 tablets total over 15 minutes.  If no relief CALL 911.  If you have dizziness/lightheadness  while taking NTG, stop taking and call 911.        Call if any questions or concerns  Do not stop plavix and asprin these are to keep stent open.

## 2017-08-01 NOTE — Progress Notes (Signed)
Progress Note  Patient Name: Ronald Faulkner Date of Encounter: 08/01/2017  Primary Cardiologist: Shelva Majestic, MD   Subjective   Mild chest pain not like admit milder.  No worse with exertion.  No SOB.     Inpatient Medications    Scheduled Meds: . allopurinol  300 mg Oral BID  . aspirin EC  81 mg Oral Daily  . atorvastatin  40 mg Oral q1800  . clopidogrel  75 mg Oral Q breakfast  . irbesartan  300 mg Oral Daily   And  . hydrochlorothiazide  12.5 mg Oral Daily  . insulin aspart  0-9 Units Subcutaneous TID WC  . metoprolol succinate  25 mg Oral Daily  . pantoprazole  40 mg Oral Daily  . sodium chloride flush  3 mL Intravenous Q12H  . sodium chloride flush  3 mL Intravenous Q12H   Continuous Infusions: . sodium chloride    . 0.9 % NaCl with KCl 20 mEq / L 75 mL/hr at 08/01/17 0600   PRN Meds: sodium chloride, acetaminophen **OR** acetaminophen, linaclotide, morphine injection, nitroGLYCERIN, ondansetron **OR** ondansetron (ZOFRAN) IV, oxymetazoline, polyethylene glycol, sodium chloride flush, traMADol   Vital Signs    Vitals:   07/31/17 2033 07/31/17 2300 08/01/17 0424 08/01/17 0732  BP: (!) 145/72 (!) 144/87 (!) 146/94 (!) 149/85  Pulse: 97 91 72 76  Resp: 20 (!) 21 18 15   Temp: 97.9 F (36.6 C)  97.6 F (36.4 C) 98 F (36.7 C)  TempSrc: Oral  Oral Oral  SpO2: 97% 96% 100% 100%  Weight:   176 lb 9.4 oz (80.1 kg)   Height:        Intake/Output Summary (Last 24 hours) at 08/01/2017 0758 Last data filed at 08/01/2017 0735 Gross per 24 hour  Intake 2797.3 ml  Output 1800 ml  Net 997.3 ml   Filed Weights   07/30/17 1751 07/31/17 0500 08/01/17 0424  Weight: 164 lb 14.4 oz (74.8 kg) 164 lb 0.4 oz (74.4 kg) 176 lb 9.4 oz (80.1 kg)    Telemetry    SR with RBBB occ PVC - Personally Reviewed  ECG    SR with RBBB and LPFB  - Personally Reviewed  Physical Exam   GEN: No acute distress.   Neck: No JVD Cardiac: RRR, no murmurs, rubs, or gallops. Rt radial cath  site without hematoma Respiratory: Clear to auscultation bilaterally. GI: Soft, nontender, non-distended  MS: No edema; No deformity. Neuro:  Nonfocal  Psych: Normal affect   Labs    Chemistry Recent Labs  Lab 07/30/17 0230 07/31/17 0330 08/01/17 0448  NA 139 140 138  K 3.4* 3.8 3.7  CL 104 108 109  CO2 23 22 21*  GLUCOSE 209* 129* 151*  BUN 18 13 13   CREATININE 0.90 0.89 0.95  CALCIUM 9.3 8.8* 8.9  GFRNONAA >60 >60 >60  GFRAA >60 >60 >60  ANIONGAP 12 10 8      Hematology Recent Labs  Lab 07/30/17 0230 07/31/17 0330 08/01/17 0448  WBC 5.5 6.4 9.7  RBC 4.78 4.13* 4.37  HGB 13.4 11.3* 12.2*  HCT 41.4 35.3* 37.0*  MCV 86.6 85.5 84.7  MCH 28.0 27.4 27.9  MCHC 32.4 32.0 33.0  RDW 14.5 14.4 14.5  PLT 243 230 223    Cardiac Enzymes Recent Labs  Lab 07/29/17 1445 07/29/17 1943 07/30/17 0230  TROPONINI <0.03 <0.03 <0.03   No results for input(s): TROPIPOC in the last 168 hours.   BNPNo results for input(s): BNP, PROBNP  in the last 168 hours.   DDimer No results for input(s): DDIMER in the last 168 hours.   Radiology    No results found.  Cardiac Studies   CORONARY STENT INTERVENTION  LEFT HEART CATH AND CORONARY ANGIOGRAPHY  Conclusion     Prox RCA to Mid RCA lesion is 20% stenosed.  Dist RCA lesion is 50% stenosed.  Dist Cx lesion is 60% stenosed.  Ost Cx to Prox Cx lesion is 20% stenosed.  Ost 1st Mrg to 1st Mrg lesion is 70% stenosed.  Ost LAD to Prox LAD lesion is 20% stenosed.  Mid LAD-1 lesion is 80% stenosed.  Mid LAD-2 lesion is 95% stenosed.  Dist LAD lesion is 70% stenosed.  A drug-eluting stent was successfully placed using a STENT SIERRA 2.25 X 12 MM.  Post intervention, there is a 0% residual stenosis.  A drug-eluting stent was successfully placed using a STENT SIERRA 2.50 X 18 MM.  Post intervention, there is a 0% residual stenosis.   1. Severe stenosis in the mid and distal LAD. Successful PTCA/DES x 1 distal LAD  and PTCA/DES x 1 mid LAD.  2. Modeately severe stenosis in small caliber intermediate branch.  3. Moderate stenosis in the distal RCA  Recommendations: Will continue DAPT with ASA and Plavix for at least one year. Will continue beta blocker and statin. I would recommend medical management of the disease in the small caliber intermediate branch.      Patient Profile     67 y.o. male with a PMH of HTN, HLD, DM type 2, GERD, prostate CA s/p prostatectomy, and former smokeradmitted with chest pain/angina increased to unstable angina.  Underwent cath.   Assessment & Plan    Unstable angina still with some chest discomfort this AM,  neg troponins on admit --similar pain now ? Add imdur for small vessel residual disease.?  CAD -residual disease in small caliber intermediate branch and moderate stenosis in the distal RCA. Treat medically   S/P mid and distal LAD DES in each area- continue ASA and plavix for at least 1 year , continue BB --follow up with Dr. Claiborne Billings or APP in next 2 weeks.   HTN  Stable though a little high 146/94 -149/85 has not had morning meds yet.  Continue home irbesartan, HCTZ, and metoprolol XL  HLD  On atorvastatin goal LDL < 70  DM-2 continue control. Per IM  GERD on PPI discharge with protonix, less interaction with plavix      For questions or updates, please contact [place] Please consult [email or web address] for contact info under Cardiology/STEMI.      Signed, Cecilie Kicks, NP  08/01/2017, 7:58 AM

## 2017-08-01 NOTE — Progress Notes (Signed)
CARDIAC REHAB PHASE I   PRE:  Rate/Rhythm: 66 nsr  BP:  Sitting: 143/76      SaO2: 100 ra  MODE:  Ambulation: 1000 ft   POST:  Rate/Rhythm: 66 nsr  BP:  Sitting: 151/85     SaO2: 100 ra  4920-1007 Patient ambulated in hallway independently. CR RN assisted with IV pole. Patient able to ambulate up incline without recurring chest pain. Post ambulation patient back to room, sitting on side of bed. Education completed with patient and wife including risk factors, nutrition, diabetes, exercise guidelines, antiplatelet, ntg, and phase 2 CR. With patients permission order placed for AP CR. Patient to be discharged this pm.  Victorino Dike, BSN 08/01/2017 9:28 AM

## 2017-08-01 NOTE — Progress Notes (Signed)
Patient has no pain at this time.Stated he has had intermittent chest pain, nothing bad just fleeting. Discussed that post PCI spasms can cause this type of pain. Notified Cecilie Kicks NP.

## 2017-08-01 NOTE — Discharge Summary (Signed)
Physician Discharge Summary  Ronald Faulkner CBU:[id number] DOB: 1950/12/07 DOA: 07/29/2017  PCP: [name], DO  Admit date: 07/29/2017 Discharge date: 08/01/2017  Admitted From: Home  disposition: Home  Recommendations for Outpatient Follow-up:  1. Follow up with PCP in 1 week with repeat BMP 2. Follow-up with cardiology in 1-2 weeks 3. Resume metformin from 08/03/2017   Home Health: No Equipment/Devices: None  Discharge Condition: Stable CODE STATUS: Full Diet recommendation: Heart Healthy / Carb Modified   Brief/Interim Summary: 67 year old male with history of diabetes, hypertension, hyperlipidemia, former smoker presented on 07/29/2017 to [facility] for chest pain progressively worsening for 2 months. Patient was admitted with unstable angina and started on heparin drip and transferred to [facility] as per cardiology recommendations.  Patient underwent cardiac catheterization and stenting.  Cardiology has cleared the patient for discharge.  Outpatient follow-up with cardiology.   Discharge Diagnoses:  Principal Problem:   Unstable angina (HCC) Active Problems:   HTN (hypertension)   GERD (gastroesophageal reflux disease)   SOB (shortness of breath)   Type 2 diabetes mellitus ([place])   1. Unstable angina/CAD:  -Initially treated with heparin drip.  Status post cardiac catheterization drug-eluting stents in the mid and distal LAD -Cardiology has cleared the patient for discharge.  Continue aspirin and Plavix for at least a year.  Continue beta-blocker, statin.  Imdur has been added by cardiology.  Outpatient follow-up with cardiology  -Echo shows EF of 50-55% with grade 1 diastolic dysfunction   2. Hypertension:  - Stable. Continue metoprolol, hydrochlorothiazide and irbesartan and Imdur  3. Type 2 diabetes mellitus: -Resume metformin from 08/03/2017 onwards.  Outpatient follow-up -Hemoglobin A1c 7.4  4. Gastroesophageal reflux disease:  Cardiology has switched omeprazole to pantoprazole.  Continue  5. Hypokalemia -Replaced.     Discharge Instructions  Discharge Instructions    Amb Referral to Cardiac Rehabilitation   Complete by:  As directed    Diagnosis:   Coronary Stents PTCA     Call MD for:  difficulty breathing, headache or visual disturbances   Complete by:  As directed    Call MD for:  extreme fatigue   Complete by:  As directed    Call MD for:  hives   Complete by:  As directed    Call MD for:  persistant dizziness or light-headedness   Complete by:  As directed    Call MD for:  persistant nausea and vomiting   Complete by:  As directed    Call MD for:  severe uncontrolled pain   Complete by:  As directed    Call MD for:  temperature >100.4   Complete by:  As directed    Diet - low sodium heart healthy   Complete by:  As directed    Diet Carb Modified   Complete by:  As directed    Increase activity slowly   Complete by:  As directed      Allergies as of 08/01/2017      Reactions   Bee Venom    Fish Allergy Hives, Swelling   Ivp Dye [iodinated Diagnostic Agents] Swelling      Medication List    STOP taking these medications   ibuprofen 800 MG tablet Commonly known as:  ADVIL,MOTRIN   metoprolol tartrate 50 MG tablet Commonly known as:  LOPRESSOR   omeprazole 20 MG capsule Commonly known as:  PRILOSEC Replaced by:  pantoprazole 40 MG tablet     TAKE these medications   12  HOUR NASAL SPRAY 0.05 % nasal spray Generic drug:  oxymetazoline Place 1 spray into both nostrils 2 (two) times daily.   allopurinol 300 MG tablet Commonly known as:  ZYLOPRIM Take 300 mg by mouth 2 (two) times daily.   aspirin EC 81 MG tablet Take 1 tablet (81 mg total) by mouth daily.   atorvastatin 40 MG tablet Commonly known as:  LIPITOR Take 1 tablet (40 mg total) by mouth daily at 6 PM.   clopidogrel 75 MG tablet Commonly known as:  PLAVIX Take 1 tablet (75 mg total) by mouth daily with  breakfast.   irbesartan-hydrochlorothiazide 300-12.5 MG tablet Commonly known as:  AVALIDE Take 1 tablet by mouth daily.   isosorbide mononitrate 30 MG 24 hr tablet Commonly known as:  IMDUR Take 1 tablet (30 mg total) by mouth daily.   LINZESS 145 MCG Caps capsule Generic drug:  linaclotide Take 145 mcg by mouth daily as needed (for constipation). As needed   metFORMIN 500 MG tablet Commonly known as:  GLUCOPHAGE Take 1 tablet (500 mg total) by mouth 2 (two) times daily with a meal. Start taking on:  08/03/2017 What changed:  These instructions start on 08/03/2017. If you are unsure what to do until then, ask your doctor or other care provider.   metoprolol succinate 25 MG 24 hr tablet Commonly known as:  TOPROL XL Take 1 tablet (25 mg total) by mouth daily. What changed:  Another medication with the same name was added. Make sure you understand how and when to take each.   metoprolol succinate 25 MG 24 hr tablet Commonly known as:  TOPROL XL Take 1 tablet (25 mg total) by mouth daily. What changed:  You were already taking a medication with the same name, and this prescription was added. Make sure you understand how and when to take each.   nitroGLYCERIN 0.4 MG SL tablet Commonly known as:  NITROSTAT Place 1 tablet (0.4 mg total) under the tongue every 5 (five) minutes as needed for chest pain.   pantoprazole 40 MG tablet Commonly known as:  PROTONIX Take 1 tablet (40 mg total) by mouth daily. Replaces:  omeprazole 20 MG capsule      Follow-up Information    Troy Sine, MD Follow up.   Specialty:  Cardiology Why:  the office will call you with date and time if you have not heard by Tuesday please call the office.   Contact information: [address] [place] [address] [phone]        [name], DO. Schedule an appointment as soon as possible for a visit in 1 week(s).   Why:  with repeat bmp Contact information: [address] [place] [address] [phone]          Allergies  Allergen Reactions  . Bee Venom   . Fish Allergy Hives and Swelling  . Ivp Dye [Iodinated Diagnostic Agents] Swelling    Consultations: Cardiology  Procedures/Studies: Dg Chest 2 View  Result Date: 07/29/2017 CLINICAL DATA:  Mid to left-sided chest pain intermittent for 2 months. History of diabetes and hypertension. EXAM: CHEST  2 VIEW COMPARISON:  Radiographs 05/26/2017. FINDINGS: The heart size and mediastinal contours are stable. There are linear opacities at both lung bases, similar to previous study, and most consistent with scarring. There is no edema, confluent airspace opacity, pleural effusion or pneumothorax. Mild degenerative changes are present throughout the thoracic spine. IMPRESSION: Stable chest with bibasilar scarring. No acute cardiopulmonary  process. Electronically Signed   By: Richardean Sale M.D.   On: 07/29/2017 17:47    CORONARY STENT INTERVENTION  LEFT HEART CATH AND CORONARY ANGIOGRAPHY  Conclusion     Prox RCA to Mid RCA lesion is 20% stenosed.  Dist RCA lesion is 50% stenosed.  Dist Cx lesion is 60% stenosed.  Ost Cx to Prox Cx lesion is 20% stenosed.  Ost 1st Mrg to 1st Mrg lesion is 70% stenosed.  Ost LAD to Prox LAD lesion is 20% stenosed.  Mid LAD-1 lesion is 80% stenosed.  Mid LAD-2 lesion is 95% stenosed.  Dist LAD lesion is 70% stenosed.  A drug-eluting stent was successfully placed using a STENT SIERRA 2.25 X 12 MM.  Post intervention, there is a 0% residual stenosis.  A drug-eluting stent was successfully placed using a STENT SIERRA 2.50 X 18 MM.  Post intervention, there is a 0% residual stenosis.  1. Severe stenosis in the mid and distal LAD. Successful PTCA/DES x 1 distal LAD and PTCA/DES x 1 mid LAD.  2. Modeately severe stenosis in small caliber intermediate branch.  3. Moderate stenosis in the distal RCA  Recommendations: Will continue DAPT with ASA  and Plavix for at least one year. Will continue beta blocker and statin. I would recommend medical management of the disease in the small caliber intermediate branch.     Echo on 07/30/2017 Study Conclusions  - Left ventricle: The cavity size was normal. Systolic function was at the lower limits of normal. The estimated ejection fraction was in the range of 50% to 55%. Wall motion was normal; there were no regional wall motion abnormalities. Doppler parameters are consistent with abnormal left ventricular relaxation (grade 1 diastolic dysfunction).     Subjective: Patient seen and examined at bedside.  No overnight fever, nausea or vomiting or shortness of breath.  Discharge Exam: Vitals:   08/01/17 0820 08/01/17 0835  BP: (!) 143/76 (!) 151/85  Pulse: (!) 25 64  Resp: 18 19  Temp:    SpO2: (!) 77% 100%   Vitals:   08/01/17 0424 08/01/17 0732 08/01/17 0820 08/01/17 0835  BP: (!) 146/94 (!) 149/85 (!) 143/76 (!) 151/85  Pulse: 72 76 (!) 25 64  Resp: 18 15 18 19   Temp: 97.6 F (36.4 C) 98 F (36.7 C)    TempSrc: Oral Oral    SpO2: 100% 100% (!) 77% 100%  Weight: 80.1 kg (176 lb 9.4 oz)     Height:        General: Pt is alert, awake, not in acute distress Cardiovascular: Rate controlled, S1/S2 + Respiratory: Bilateral decreased breath sounds at bases Abdominal: Soft, NT, ND, bowel sounds + Extremities: no edema, no cyanosis    The results of significant diagnostics from this hospitalization (including imaging, microbiology, ancillary and laboratory) are listed below for reference.     Microbiology: Recent Results (from the past 240 hour(s))  MRSA PCR Screening     Status: None   Collection Time: 07/30/17 10:15 AM  Result Value Ref Range Status   MRSA by PCR NEGATIVE NEGATIVE Final    Comment:        The GeneXpert MRSA Assay (FDA approved for NASAL specimens only), is one component of a comprehensive MRSA colonization surveillance program. It is  not intended to diagnose MRSA infection nor to guide or monitor treatment for MRSA infections. Performed at [facility], [place] [address] [place], [place] [address]      Labs: BNP (last  3 results) Recent Labs    05/26/17 1447  BNP 62.6   Basic Metabolic Panel: Recent Labs  Lab 07/29/17 1445 07/30/17 0230 07/31/17 0330 08/01/17 0448  NA 138 139 140 138  K 3.9 3.4* 3.8 3.7  CL 104 104 108 109  CO2 21* 23 22 21*  GLUCOSE 188* 209* 129* 151*  BUN 18 18 13 13   CREATININE 1.05 0.90 0.89 0.95  CALCIUM 9.6 9.3 8.8* 8.9  MG  --   --  1.8 1.9   Liver Function Tests: No results for input(s): AST, ALT, ALKPHOS, BILITOT, PROT, ALBUMIN in the last 168 hours. No results for input(s): LIPASE, AMYLASE in the last 168 hours. No results for input(s): AMMONIA in the last 168 hours. CBC: Recent Labs  Lab 07/29/17 1445 07/30/17 0230 07/31/17 0330 08/01/17 0448  WBC 6.3 5.5 6.4 9.7  HGB 13.2 13.4 11.3* 12.2*  HCT 41.8 41.4 35.3* 37.0*  MCV 86.9 86.6 85.5 84.7  PLT 262 243 230 223   Cardiac Enzymes: Recent Labs  Lab 07/29/17 1445 07/29/17 1943 07/30/17 0230  TROPONINI <0.03 <0.03 <0.03   BNP: Invalid input(s): POCBNP CBG: Recent Labs  Lab 07/31/17 1142 07/31/17 1443 07/31/17 1822 07/31/17 2121 08/01/17 0613  GLUCAP 166* 153* 213* 207* 132*   D-Dimer No results for input(s): DDIMER in the last 72 hours. Hgb A1c Recent Labs    07/29/17 1440 07/31/17 0330  HGBA1C 7.4* 7.4*   Lipid Profile No results for input(s): CHOL, HDL, LDLCALC, TRIG, CHOLHDL, LDLDIRECT in the last 72 hours. Thyroid function studies No results for input(s): TSH, T4TOTAL, T3FREE, THYROIDAB in the last 72 hours.  Invalid input(s): FREET3 Anemia work up No results for input(s): VITAMINB12, FOLATE, FERRITIN, TIBC, IRON, RETICCTPCT in the last 72 hours. Urinalysis No results found for: COLORURINE, APPEARANCEUR, [place], [place], [place], [place], [place], [place], PROTEINUR,  UROBILINOGEN, NITRITE, LEUKOCYTESUR Sepsis Labs Invalid input(s): PROCALCITONIN,  WBC,  LACTICIDVEN Microbiology Recent Results (from the past 240 hour(s))  MRSA PCR Screening     Status: None   Collection Time: 07/30/17 10:15 AM  Result Value Ref Range Status   MRSA by PCR NEGATIVE NEGATIVE Final    Comment:        The GeneXpert MRSA Assay (FDA approved for NASAL specimens only), is one component of a comprehensive MRSA colonization surveillance program. It is not intended to diagnose MRSA infection nor to guide or monitor treatment for MRSA infections. Performed at [facility], [place] [address], [place], [place] [address]      Time coordinating discharge: 35 minutes  SIGNED:   Aline August, MD  [facility] 08/01/2017, 10:20 AM Pager: [phone]  If 7PM-7AM, please contact night-coverage [email or web address] Password [email or web address]

## 2017-08-03 ENCOUNTER — Telehealth: Payer: Self-pay | Admitting: Cardiovascular Disease

## 2017-08-03 NOTE — Telephone Encounter (Signed)
New message   Pt c/o medication issue:  1. Name of Medication: ibuprofen  2. How are you currently taking this medication (dosage and times per day)?   3. Are you having a reaction (difficulty breathing--STAT)? no  4. What is your medication issue? Pt verbalized that after he was discharged from having stints put in, his discharge paper told him to stop his ibuprofen, pt wants to know is that for good or just while he was in the hospital. Please call

## 2017-08-03 NOTE — Telephone Encounter (Signed)
Returned call to patient, patient states he is experiencing a headache and was wondering what he could take for this.  Advised to take tylenol but avoid ibuprofen.   Patient verbalized understanding.

## 2017-08-05 ENCOUNTER — Telehealth: Payer: Self-pay | Admitting: Cardiovascular Disease

## 2017-08-05 NOTE — Telephone Encounter (Signed)
New Message:     Pt had a stent put in on 07-31-17,he wants to know if he can go out of town on Tuesday(08-11-17)?

## 2017-08-05 NOTE — Telephone Encounter (Signed)
Patient called. Aware he can travel. He states he has some swelling in arm that was used for cath access site. Explained there may be some swelling - monitor for worsening swelling or swelling that moves to other locations in arm, redness/tenderness at insertion site, new bruising.

## 2017-08-16 NOTE — Progress Notes (Signed)
Cardiology Office Note   Date:  08/17/2017   ID:  Ronald Faulkner, DOB 1950/09/28, MRN [id number]  PCP:  [name], DO  Cardiologist: Evalyn Casco    Chief Complaint  Patient presents with  . Follow-up  . Chest Pain  . Coronary Artery Disease     History of Present Illness: Ronald Faulkner is a 67 y.o. male who presents for posthospitalization follow-up after admission for chest pain, EKG revealed right bundle branch block with left anterior fascicular block.  Patient had cardiac catheterization revealing mid LAD to the lesion 95% stenosed distal LAD 70% stenosed.  He received a drug-eluting stent x 2 to the mid and distal LAD per Dr. [name] on 07/31/2017.  The patient was recommended for aspirin and Plavix for at least one year, to continue beta-blocker, continue irbesartan, HCTZ, metoprolol, and atorvastatin with goal of LDL less than 70.  Other history includes hypertension, hyperlipidemia, diabetes type 2, GERD, and prostate CA status post prostatectomy, remote history of smoking.  He comes today with complaints of bleeding chest pain lasting less than a minute. He denies any shortness of breath, diaphoresis, dizziness, or nausea which he experienced prior to stents of the LAD. He has multiple questions.  Past Medical History:  Diagnosis Date  . Arthritis   . Cancer ([place])   . Colon polyps   . Diabetes mellitus without complication ([place])   . Diverticulitis   . Hypertension   . IBS (irritable bowel syndrome)   . 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 Unstable angina ([place]) 07/2017    Past Surgical History:  Procedure Laterality Date  . ABDOMINAL SURGERY    . APPENDECTOMY    . BREAST SURGERY     benign lump at lt side  . COLON SURGERY    . COLONOSCOPY  03/14/2011   Procedure: COLONOSCOPY;  Surgeon: [name], MD;  Location: [facility];  Service: Endoscopy;  Laterality: N/A;  1:00  . COLONOSCOPY N/A 11/15/2015   Procedure:  COLONOSCOPY;  Surgeon: [name], MD;  Location: [facility];  Service: Endoscopy;  Laterality: N/A;  11:15  . CORONARY STENT INTERVENTION N/A 07/31/2017   Procedure: CORONARY STENT INTERVENTION;  Surgeon: [name], MD;  Location: [facility];  Service: Cardiovascular;  Laterality: N/A;  . LEFT HEART CATH AND CORONARY ANGIOGRAPHY N/A 07/31/2017   Procedure: LEFT HEART CATH AND CORONARY ANGIOGRAPHY;  Surgeon: [name], MD;  Location: [facility];  Service: Cardiovascular;  Laterality: N/A;  . PENILE PROSTHESIS IMPLANT     2016   . prostate cancer     2015. prostectomy     Current Outpatient Medications  Medication Sig Dispense Refill  . allopurinol (ZYLOPRIM) 300 MG tablet Take 300 mg by mouth 2 (two) times daily.     Marland Kitchen aspirin EC 81 MG tablet Take 1 tablet (81 mg total) by mouth daily. 30 tablet 0  . atorvastatin (LIPITOR) 80 MG tablet Take 1 tablet (80 mg total) by mouth daily at 6 PM. 30 tablet 5  . clopidogrel (PLAVIX) 75 MG tablet Take 1 tablet (75 mg total) by mouth daily with breakfast. 30 tablet 11  . irbesartan-hydrochlorothiazide (AVALIDE) 300-12.5 MG tablet Take 1 tablet by mouth daily. 30 tablet 5  . isosorbide mononitrate (IMDUR) 30 MG 24 hr tablet Take 1 tablet (30 mg total) by mouth daily. 30 tablet 6  . linaclotide (LINZESS) 145 MCG CAPS capsule Take  145 mcg by mouth daily as needed (for constipation). As needed    . metFORMIN (GLUCOPHAGE) 500 MG tablet Take 1 tablet (500 mg total) by mouth 2 (two) times daily with a meal.    . metoprolol succinate (TOPROL XL) 25 MG 24 hr tablet Take 1 tablet (25 mg total) by mouth daily. 30 tablet 5  . nitroGLYCERIN (NITROSTAT) 0.4 MG SL tablet Place 1 tablet (0.4 mg total) under the tongue every 5 (five) minutes as needed for chest pain. 25 tablet 4  . oxymetazoline (12 HOUR NASAL SPRAY) 0.05 % nasal spray Place 1 spray into both nostrils 2 (two) times daily.     . pantoprazole (PROTONIX) 40 MG  tablet Take 1 tablet (40 mg total) by mouth daily. 30 tablet 6   No current facility-administered medications for this visit.     Allergies:   Bee venom; Fish allergy; and Ivp dye [iodinated diagnostic agents]    Social History:  The patient  reports that he quit smoking about 18 years ago. His smoking use included cigarettes. He has a 35.00 pack-year smoking history. he has never used smokeless tobacco. He reports that he drinks alcohol. He reports that he does not use drugs.   Family History:  The patient's family history includes Colon cancer in his mother; Coronary artery disease in his maternal grandmother.    ROS: All other systems are reviewed and negative. Unless otherwise mentioned in H&P    PHYSICAL EXAM: VS:  BP 115/78   Pulse 77   Ht 5\' 7"  (1.702 m)   Wt 164 lb (74.4 kg)   BMI 25.69 kg/m  , BMI Body mass index is 25.69 kg/m. GEN: Well nourished, well developed, in no acute distress  HEENT: normal  Neck: no JVD, carotid bruits, or masses Cardiac: RRR; no murmurs, rubs, or gallops,no edema  Respiratory:  clear to auscultation bilaterally, normal work of breathing GI: soft, nontender, nondistended, + BS MS: no deformity or atrophy well healed catheter insertion site right wrist. Skin: warm and dry, no rash Neuro:  Strength and sensation are intact Psych: euthymic mood, full affect   EKG: Normal sinus rhythm, heart rate 77 bpm, right bundle branch block. Recent Labs: 05/26/2017: B Natriuretic Peptide 64.4 08/01/2017: BUN 13; Creatinine, Ser 0.95; Hemoglobin 12.2; Magnesium 1.9; Platelets 223; Potassium 3.7; Sodium 138    Lipid Panel    Component Value Date/Time   CHOL 195 05/27/2017 0844   TRIG 253 (H) 05/27/2017 0844   HDL 45 05/27/2017 0844   CHOLHDL 4.3 05/27/2017 0844   VLDL 51 (H) 05/27/2017 0844   LDLCALC 99 05/27/2017 0844      Wt Readings from Last 3 Encounters:  08/17/17 164 lb (74.4 kg)  08/01/17 176 lb 9.4 oz (80.1 kg)  07/07/17 162 lb 12.8 oz  (73.8 kg)      Other studies Reviewed: CORONARY STENT INTERVENTION  LEFT HEART CATH AND CORONARY ANGIOGRAPHY  08/01/2017    Prox RCA to Mid RCA lesion is 20% stenosed.  Dist RCA lesion is 50% stenosed.  Dist Cx lesion is 60% stenosed.  Ost Cx to Prox Cx lesion is 20% stenosed.  Ost 1st Mrg to 1st Mrg lesion is 70% stenosed.  Ost LAD to Prox LAD lesion is 20% stenosed.  Mid LAD-1 lesion is 80% stenosed.  Mid LAD-2 lesion is 95% stenosed.  Dist LAD lesion is 70% stenosed.  A drug-eluting stent was successfully placed using a STENT SIERRA 2.25 X 12 MM.  Post intervention, there  is a 0% residual stenosis.  A drug-eluting stent was successfully placed using a STENT SIERRA 2.50 X 18 MM.  Post intervention, there is a 0% residual stenosis.  1. Severe stenosis in the mid and distal LAD. Successful PTCA/DES x 1 distal LAD and PTCA/DES x 1 mid LAD.  2. Modeately severe stenosis in small caliber intermediate branch.  3. Moderate stenosis in the distal RCA  Recommendations: Will continue DAPT with ASA and Plavix for at least one year. Will continue beta blocker and statin. I would recommend medical management of the disease in the small caliber intermediate branch.     ASSESSMENT AND PLAN:  1. Coronary artery disease: Status post cardiac catheterization revealing mid and distal LAD stenosis, requiring 2 separate drug-eluting stents. Distal LAD had 70% stenosis, and did not require intervention. Minimal disease in other coronary arteries.  I have given him a in-depth explanation of coronary artery disease, stent placement, medications, and answered multiple questions. He is given a printout illustration of his catheterization, both before and after diagram.. He will continue on aspirin and Plavix as directed for minimum of one year. No elective surgery should be completed within that year, possibly in 6 months if absolutely necessary.  2. Hypertension: Currently  well-controlled on metoprolol, and irbesartan HCTZ along with isosorbide.  3. Hypercholesterolemia: Due to CAD with 2 stents in the LAD I'm going to increase his Lipitor to 80 mg daily from 40 mg daily. Follow-up fasting lipids and LFTs in 3 months.  4. Recurrent chest pain: He states this occurs when he has not had a bowel movement, not with exertion, or any type of physical activity. Will follow.     Current medicines are reviewed at length with the patient today.    Labs/ tests ordered today include: Aspirin lipids and LFTs in 3 months    Phill Myron. West Pugh, ANP, AACC   08/17/2017 12:22 PM    [facility] [address], [place], [place] [address] Phone: [phone]; Fax: [phone]

## 2017-08-17 ENCOUNTER — Encounter: Payer: Self-pay | Admitting: Adult Health

## 2017-08-17 ENCOUNTER — Ambulatory Visit: Payer: Medicare HMO | Admitting: Adult Health

## 2017-08-17 VITALS — BP 115/78 | HR 77 | Ht 67.0 in | Wt 164.0 lb

## 2017-08-17 DIAGNOSIS — I2510 Atherosclerotic heart disease of native coronary artery without angina pectoris: Secondary | ICD-10-CM

## 2017-08-17 DIAGNOSIS — R9431 Abnormal electrocardiogram [ECG] [EKG]: Secondary | ICD-10-CM | POA: Diagnosis not present

## 2017-08-17 DIAGNOSIS — E7800 Pure hypercholesterolemia, unspecified: Secondary | ICD-10-CM

## 2017-08-17 DIAGNOSIS — Z79899 Other long term (current) drug therapy: Secondary | ICD-10-CM

## 2017-08-17 DIAGNOSIS — R0789 Other chest pain: Secondary | ICD-10-CM

## 2017-08-17 DIAGNOSIS — I10 Essential (primary) hypertension: Secondary | ICD-10-CM

## 2017-08-17 MED ORDER — NITROGLYCERIN 0.4 MG SL SUBL: 0.4000 mg | SUBLINGUAL_TABLET | SUBLINGUAL | 4 refills | Status: DC | PRN

## 2017-08-17 MED ORDER — ISOSORBIDE MONONITRATE ER 30 MG PO TB24: 30.0000 mg | ORAL_TABLET | Freq: Every day | ORAL | 6 refills | Status: DC

## 2017-08-17 MED ORDER — METOPROLOL SUCCINATE ER 25 MG PO TB24: 25.0000 mg | ORAL_TABLET | Freq: Every day | ORAL | 5 refills | Status: DC

## 2017-08-17 MED ORDER — CLOPIDOGREL BISULFATE 75 MG PO TABS: 75.0000 mg | ORAL_TABLET | Freq: Every day | ORAL | 11 refills | Status: DC

## 2017-08-17 MED ORDER — ATORVASTATIN CALCIUM 80 MG PO TABS: 80.0000 mg | ORAL_TABLET | Freq: Every day | ORAL | 5 refills | Status: DC

## 2017-08-17 MED ORDER — IRBESARTAN-HYDROCHLOROTHIAZIDE 300-12.5 MG PO TABS: 1.0000 | ORAL_TABLET | Freq: Every day | ORAL | 5 refills | Status: DC

## 2017-08-17 NOTE — Patient Instructions (Signed)
Medication Instructions:  TAKE ATORVASTATIN 80MG  DAILY   If you need a refill on your cardiac medications before your next appointment, please call your pharmacy.  Labwork: FLP AND LFT-BEFORE YOUR NEXT APPT HERE IN OUR OFFICE AT [facility]  Take the provided lab slips for you to take with you to the lab for you blood draw.   You will need to fast. DO NOT EAT OR DRINK PAST MIDNIGHT.   Follow-Up: Your physician wants you to follow-up in: [place].  Thank you for choosing [facility] at [facility]!!

## 2017-10-27 ENCOUNTER — Telehealth: Payer: Self-pay | Admitting: Adult Health

## 2017-10-27 NOTE — Telephone Encounter (Signed)
Spoke with pt, Sunday while getting ready for church he developed a hard, sharpe pain in his chest. There was no radiation but he did have SOB. He took 1 NTG and got relief after a few minutes. Today he is having a fleeting type pain through his chest. He wants to know what to do if it happened again. NTG use and when to call 911 discussed in detail with patient. Patient has a follow up appointment with dr Claiborne Billings 6/26. Offered patient a sooner appointment but he declined and said he will call back if happens again.

## 2017-10-27 NOTE — Telephone Encounter (Signed)
New Message   Pt c/o of Chest Pain: STAT if CP now or developed within 24 hours  1. Are you having CP right now? no  2. Are you experiencing any other symptoms (ex. SOB, nausea, vomiting, sweating)? sob  3. How long have you been experiencing CP? Since Sunday 5/26  4. Is your CP continuous or coming and going? Coming and going  5. Have you taken Nitroglycerin? Yes   ?

## 2017-11-11 ENCOUNTER — Other Ambulatory Visit: Payer: Self-pay

## 2017-11-11 MED ORDER — IRBESARTAN-HYDROCHLOROTHIAZIDE 300-12.5 MG PO TABS: 1.0000 | ORAL_TABLET | Freq: Every day | ORAL | 5 refills | Status: DC

## 2017-11-17 ENCOUNTER — Other Ambulatory Visit: Payer: Self-pay | Admitting: *Deleted

## 2017-11-17 MED ORDER — IRBESARTAN-HYDROCHLOROTHIAZIDE 300-12.5 MG PO TABS: 1.0000 | ORAL_TABLET | Freq: Every day | ORAL | 5 refills | Status: DC

## 2017-11-25 ENCOUNTER — Encounter: Payer: Self-pay | Admitting: Cardiovascular Disease

## 2017-11-25 ENCOUNTER — Ambulatory Visit: Payer: Medicare HMO | Admitting: Cardiovascular Disease

## 2017-11-25 VITALS — BP 118/84 | HR 73 | Ht 67.0 in | Wt 165.2 lb

## 2017-11-25 DIAGNOSIS — I10 Essential (primary) hypertension: Secondary | ICD-10-CM

## 2017-11-25 DIAGNOSIS — Z79899 Other long term (current) drug therapy: Secondary | ICD-10-CM

## 2017-11-25 DIAGNOSIS — E782 Mixed hyperlipidemia: Secondary | ICD-10-CM | POA: Diagnosis not present

## 2017-11-25 DIAGNOSIS — I25119 Atherosclerotic heart disease of native coronary artery with unspecified angina pectoris: Secondary | ICD-10-CM | POA: Diagnosis not present

## 2017-11-25 DIAGNOSIS — Z8546 Personal history of malignant neoplasm of prostate: Secondary | ICD-10-CM

## 2017-11-25 MED ORDER — ISOSORBIDE MONONITRATE ER 60 MG PO TB24: 60.0000 mg | ORAL_TABLET | Freq: Every day | ORAL | 3 refills | Status: DC

## 2017-11-25 MED ORDER — EZETIMIBE 10 MG PO TABS: 10.0000 mg | ORAL_TABLET | Freq: Every day | ORAL | 3 refills | Status: DC

## 2017-11-25 MED ORDER — METOPROLOL SUCCINATE ER 25 MG PO TB24: 37.5000 mg | ORAL_TABLET | Freq: Every day | ORAL | 3 refills | Status: DC

## 2017-11-25 MED ORDER — ATORVASTATIN CALCIUM 40 MG PO TABS: 40.0000 mg | ORAL_TABLET | Freq: Every day | ORAL | 3 refills | Status: DC

## 2017-11-25 NOTE — Patient Instructions (Signed)
Medication Instructions:  INCREASE isosorbide (Imdur) to 60 mg daily INCREASE metoprolol succinate (Toprol XL) to 37.5 mg daily DECREASE atorvastatin to 40 mg daily  START Zetia 10 mg daily START OTC coenzyme Q10 200-300 mg daily  Labwork: Please return for FASTING labs in 3 months (CMET, Lipid, CBC)  Our in office lab hours are Monday-Friday 8:00-4:00, closed for lunch 12:45-1:45 pm.  No appointment needed.  Follow-Up: 3-4 months with Dr. Claiborne Billings  Any Other Special Instructions Will Be Listed Below (If Applicable).     If you need a refill on your cardiac medications before your next appointment, please call your pharmacy.

## 2017-11-25 NOTE — Progress Notes (Signed)
Cardiology Office Note    Date:  11/27/2017   ID:  [facility], DOB Jan 20, 1951, MRN [id number]  PCP:  [name], DO  Cardiologist:  Shelva Majestic, [facility]   Chief Complaint  Patient presents with  . Follow-up    pt reports continued chest pain and shortness of breath - had to take 1 NTG w/ relief "has these pains regularly."   4 month F/U  evaluation with me  History of Present Illness:  [facility] is a 67 y.o. male who has a history of hypertension, diabetes mellitus, prostate cancer, status post prostatectomy, colitis, and had been recently admitted to [facility] on 05/26/2017 and discharged on 05/27/2017.  During that evaluation, he was seen by Dr. [name] for chest pain that was felt to be atypical.  His chest pain was left-sided, sharp and stabbing and lasted seconds.  There was some radiation to his back.  There are no exacerbating factors.  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  Troponins were negative.  BMP was normal at 64.  Chest x-ray was negative.  He initially was significantly hypertensive at 187/102 and type hypertensive medications were resumed.  An echo Doppler study on 05/27/2017 showed an EF of 50-55%.  There were no segmental wall motion abnormalities.  There was grade 1 diastolic dysfunction.  There was mild MR and TR.  He was scheduled to undergo an outpatient CT scan, but apparently, on January 1 the insurance changed and the CT scan was canceled since it had been set up with his old insurance.  For some reason, he was put on my schedule today for follow-up evaluation.  He has history of diabetes mellitus since 2007.  He has a history of hypertension for 9 years but had not taken medications for at least 3 weeks prior to his hospitalization.  There is a family history for heart disease.  When I initially saw him, he was not having any classic exertional chest pain.  However, after his evaluation he was hospitalized on  July 29, 2017 at [facility] with progressive chest pain symptomatology.  He was transferred to [facility] and ultimately underwent cardiac catheterization on March 1 by Dr. [name] and underwent successful PTCA and DES stenting to his distal LAD and mid LAD there was still very apical 70% LAD stenoses, the percent circumflex marginal stenosis and 60% distal circumflex stenosis with 50% RCA stenosis for which medical therapy was recommended.  He saw [name] in follow-up August 17, 2017 and was doing well.  The last several months, he has remained fairly stable.  2 weeks ago he did experience an episode of chest pain which lasted 1 to 2 minutes associated with gasping for breath and this occurred while he was standing and talking.  He is a Company secretary of music.  He denies any recent exertional precipitation to his chest pain.  He has a history of prostate CA in 2015 and underwent prostatectomy.  He has had issues with recent urinary incontinence and has been seen by urology with consideration for possible insertion of a surgical sling.  Since his intervention he has been on atorvastatin 80 mg but admits to recent development of some myalgias.  He presents for evaluation.  Past Medical History:  Diagnosis Date  . Arthritis   . Cancer ([place])   . Colon polyps   . Diabetes mellitus without complication ([facility])   . Diverticulitis   . Hypertension   .  IBS (irritable bowel syndrome)   . 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 Unstable angina ([facility]) 07/2017    Past Surgical History:  Procedure Laterality Date  . ABDOMINAL SURGERY    . APPENDECTOMY    . BREAST SURGERY     benign lump at lt side  . COLON SURGERY    . COLONOSCOPY  03/14/2011   Procedure: COLONOSCOPY;  Surgeon: [name], [facility];  [facility];  Service: Endoscopy;  Laterality: N/A;  1:00  . COLONOSCOPY N/A 11/15/2015   Procedure: COLONOSCOPY;  Surgeon: [name], [facility];   [facility];  Service: Endoscopy;  Laterality: N/A;  11:15  . CORONARY STENT INTERVENTION N/A 07/31/2017   Procedure: CORONARY STENT INTERVENTION;  Surgeon: [facility];  [facility]: [facility];  Service: Cardiovascular;  Laterality: N/A;  . LEFT HEART CATH AND CORONARY ANGIOGRAPHY N/A 07/31/2017   Procedure: LEFT HEART CATH AND CORONARY ANGIOGRAPHY;  Surgeon: [facility];  Location: [facility];  Service: Cardiovascular;  Laterality: N/A;  . PENILE PROSTHESIS IMPLANT     2016   . prostate cancer     2015. prostectomy    Current Medications: Outpatient Medications Prior to Visit  Medication Sig Dispense Refill  . allopurinol (ZYLOPRIM) 300 MG tablet Take 300 mg by mouth 2 (two) times daily.     Marland Kitchen aspirin EC 81 MG tablet Take 1 tablet (81 mg total) by mouth daily. 30 tablet 0  . clopidogrel (PLAVIX) 75 MG tablet Take 1 tablet (75 mg total) by mouth daily with breakfast. 30 tablet 11  . irbesartan-hydrochlorothiazide (AVALIDE) 300-12.5 MG tablet Take 1 tablet by mouth daily. 30 tablet 5  . linaclotide (LINZESS) 145 MCG CAPS capsule Take 145 mcg by mouth daily as needed (for constipation). As needed    . metFORMIN (GLUCOPHAGE) 500 MG tablet Take 1 tablet (500 mg total) by mouth 2 (two) times daily with a meal.    . nitroGLYCERIN (NITROSTAT) 0.4 MG SL tablet Place 1 tablet (0.4 mg total) under the tongue every 5 (five) minutes as needed for chest pain. 25 tablet 4  . oxymetazoline (12 HOUR NASAL SPRAY) 0.05 % nasal spray Place 1 spray into both nostrils 2 (two) times daily.     . pantoprazole (PROTONIX) 40 MG tablet Take 1 tablet (40 mg total) by mouth daily. 30 tablet 6  . atorvastatin (LIPITOR) 80 MG tablet Take 1 tablet (80 mg total) by mouth daily at 6 PM. 30 tablet 5  . isosorbide mononitrate (IMDUR) 30 MG 24 hr tablet Take 1 tablet (30 mg total) by mouth daily. 30 tablet 6  . metoprolol succinate (TOPROL XL) 25 MG 24 hr tablet Take 1  tablet (25 mg total) by mouth daily. 30 tablet 5   No facility-administered medications prior to visit.      Allergies:   Bee venom; Fish allergy; and Ivp dye [iodinated diagnostic agents]   Social History   Socioeconomic History  . Marital status: Married    Spouse name: Not on file  . Number of children: 2  . Years of education: Not on file  . Highest education level: Not on file  Occupational History    Employer: [employer]  Social Needs  . Financial resource strain: Not on file  . Food insecurity:    Worry: Not on file    Inability: Not on file  . Transportation needs:  Medical: Not on file    Non-medical: Not on file  Tobacco Use  . Smoking status: Former Smoker    Packs/day: 1.00    Years: 35.00    Pack years: 35.00    Types: Cigarettes    Last attempt to quit: 06/03/1999    Years since quitting: 18.4  . Smokeless tobacco: Never Used  . Tobacco comment: quit 16 years ago  Substance and Sexual Activity  . Alcohol use: Yes    Comment: occ  . Drug use: No    Comment: hx of use in his colege days  . Sexual activity: Not on file  Lifestyle  . Physical activity:    Days per week: Not on file    Minutes per session: Not on file  . Stress: Not on file  Relationships  . Social connections:    Talks on phone: Not on file    Gets together: Not on file    Attends religious service: Not on file    Active member of club or organization: Not on file    Attends meetings of clubs or organizations: Not on file    Relationship status: Not on file  Other Topics Concern  . Not on file  Social History Narrative   Lives at home with wife and daughter and her three children.      He is married for 20 years.  He has 2 children, 7 grandchildren, and one great-grandchild.  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  He completed 2 years of college.  He drinks occasional beer, at least 4 per week.  He does not routinely  exercise.  Family History:  The patient's family history includes Colon cancer in his mother; Coronary artery disease in his maternal grandmother.  His sisters age 64 and a brother is 49.  He has 2 children, ages 62 and 32 and 1 has diabetes mellitus.  ROS General: Negative; No fevers, chills, or night sweats;  HEENT: Negative; No changes in vision or hearing, sinus congestion, difficulty swallowing Pulmonary: Negative; No cough, wheezing, shortness of breath, hemoptysis Cardiovascular: Negative; No chest pain, presyncope, syncope, palpitations GI: Negative; No nausea, vomiting, diarrhea, or abdominal pain GU: History of prostate CA, status post prostatectomy, recent urinary incontinence Musculoskeletal: Negative; no myalgias, joint pain, or weakness Hematologic/Oncology: Negative; no easy bruising, bleeding Endocrine: Negative; no heat/cold intolerance; no diabetes Neuro: Negative; no changes in balance, headaches Skin: Negative; No rashes or skin lesions Psychiatric: Negative; No behavioral problems, depression Sleep: As of her fatigue and daytime sleepiness.  I calculated an Epworth Sleepiness Scale score in the office today and this endorsed at 15.; no bruxism, restless legs, hypnogognic hallucinations, no cataplexy Other comprehensive 14 point system review is negative.   PHYSICAL EXAM:   VS:  BP 118/84   Pulse 73   Ht 5\' 7"  (1.702 m)   Wt 165 lb 3.2 oz (74.9 kg)   BMI 25.87 kg/m     Repeat blood pressure 122/80 supine and 120/82 standing  Wt Readings from Last 3 Encounters:  11/25/17 165 lb 3.2 oz (74.9 kg)  08/17/17 164 lb (74.4 kg)  08/01/17 176 lb 9.4 oz (80.1 kg)    General: Alert, oriented, no distress.  Skin: normal turgor, no rashes, warm and dry HEENT: Normocephalic, atraumatic. Pupils equal round and reactive to light; sclera anicteric; extraocular muscles intact;  Nose without nasal septal hypertrophy Mouth/Parynx benign; Mallinpatti scale 3 Neck: No JVD, no  carotid bruits; normal carotid upstroke Lungs: clear to ausculatation and percussion; no wheezing or rales Chest wall: without tenderness to palpitation Heart: PMI not displaced, RRR, s1 s2 normal, 1/6 systolic murmur, no diastolic murmur, no rubs, gallops, thrills, or heaves Abdomen: soft, nontender; no hepatosplenomehaly, BS+; abdominal aorta nontender and not dilated by palpation. Back: no CVA tenderness Pulses 2+ Musculoskeletal: full range of motion, normal strength, no joint deformities Extremities: no clubbing cyanosis or edema, Homan's sign negative  Neurologic: grossly nonfocal; Cranial nerves grossly wnl Psychologic: Normal mood and affect   Studies/Labs Reviewed:   EKG:  EKG is ordered today.  ECG (independently read by me): Normal sinus rhythm at 73 bpm.  Right bundle branch block with repolarization changes.  No ectopy.  July 07, 2017 ECG (independently read by me): Sinus tachycardia 104 bpm.  Right bundle branch block, left posterior hemiblock.  QTc interval 512 milliseconds.  PR interval 134 ms.  Recent Labs: BMP Latest Ref Rng & Units 08/01/2017 07/31/2017 07/30/2017  Glucose 65 - 99 mg/dL 151(H) 129(H) 209(H)  BUN 6 - 20 mg/dL 13 13 18   Creatinine 0.61 - 1.24 mg/dL 0.95 0.89 0.90  Sodium 135 - 145 mmol/L 138 140 139  Potassium 3.5 - 5.1 mmol/L 3.7 3.8 3.4(L)  Chloride 101 - 111 mmol/L 109 108 104  CO2 22 - 32 mmol/L 21(L) 22 23  Calcium 8.9 - 10.3 mg/dL 8.9 8.8(L) 9.3     No flowsheet data found.  CBC Latest Ref Rng & Units 08/01/2017 07/31/2017 07/30/2017  WBC 4.0 - 10.5 K/uL 9.7 6.4 5.5  Hemoglobin 13.0 - 17.0 g/dL 12.2(L) 11.3(L) 13.4  Hematocrit 39.0 - 52.0 % 37.0(L) 35.3(L) 41.4  Platelets 150 - 400 K/uL 223 230 243   [facility] Results  Component Value Date   MCV 84.7 08/01/2017   MCV 85.5 07/31/2017   MCV 86.6 07/30/2017   No results found for: TSH [facility] Results  Component Value Date   HGBA1C 7.4 (H) 07/31/2017     BNP    Component Value Date/Time   BNP  64.4 05/26/2017 1447    ProBNP No results found for: PROBNP   Lipid Panel     Component Value Date/Time   CHOL 195 05/27/2017 0844   TRIG 253 (H) 05/27/2017 0844   HDL 45 05/27/2017 0844   CHOLHDL 4.3 05/27/2017 0844   VLDL 51 (H) 05/27/2017 0844   LDLCALC 99 05/27/2017 0844     RADIOLOGY: No results found.   Additional studies/ records that were reviewed today include:  I reviewed the patient's most recent hospitalization, echo Doppler study and laboratory.    ASSESSMENT:    1. Coronary artery disease involving native coronary artery of native heart with angina pectoris ([place])   2. Essential hypertension   3. Mixed hyperlipidemia   4. Medication management   5. History of prostate cancer      PLAN:  Mr. 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  Remotely, he had seen Dr. Percival Spanish in 2013 for chest pain.  A nuclear stress test was negative for ischemia.  A 2-D echo Doppler study showed normal ejection fraction.  He had not been seen by cardiology for many years but was admitted to [facility] by the medicine service with atypical chest pain on Christmas Day 2018.  He ruled out for myocardial infarction.  His chest  pain sounded atypical for CAD, but with his cardiac vascular comorbidities.  He was scheduled to undergo a coronary CT scan.  This was scheduled but unfortunately his insurance has changed, which led to ultimate denial and need for rescheduled.  He developed progressive symptoms worrisome for angina leading to any [facility] presentation at the end of February and ultimate cardiac catheterization which was performed on July 31, 2017.  I reviewed his catheterization findings with him in detail.  He underwent stenting of his mid and distal LAD but has concomitant CAD with residual stenoses of 70  and 60% multiple sites.  I have recommended titration of isosorbide to 60 mg from his present dose of 30 mg and since he is not well beta blocked I will further titrate Toprol to 01.0 mg.  I am not certain that his chest pain 2 weeks ago while he was standing and talking was ischemic chest discomfort.  He has begun to have some issues with atorvastatin and 80 mg and I have suggested dose reduction to 40 mg but will add Zetia 10 mg which should even further lower LDL level.  I have suggested coenzyme Q 10 supplementation.  Since a stent was placed on March 1, I have recommended at minimum that any elective urologic surgery be deferred for at least 6 months so that his DAPT does not need to be interrupted with risk for potential subacute thrombosis.  I will recheck laboratory in 2 to 3 months and see him back in the office for reevaluation.   Medication Adjustments/Labs and Tests Ordered: Current medicines are reviewed at length with the patient today.  Concerns regarding medicines are outlined above.  Medication changes, Labs and Tests ordered today are listed in the Patient Instructions below. Patient Instructions  Medication Instructions:  INCREASE isosorbide (Imdur) to 60 mg daily INCREASE metoprolol succinate (Toprol XL) to 37.5 mg daily DECREASE atorvastatin to 40 mg daily  START Zetia 10 mg daily START OTC coenzyme Q10 200-300 mg daily  Labwork: Please return for FASTING labs in 3 months (CMET, Lipid, CBC)  Our in office [facility] hours are Monday-Friday 8:00-4:00, closed for lunch 12:45-1:45 pm.  No appointment needed.  Follow-Up: 3-4 months with Dr. Claiborne Billings  Any Other Special Instructions Will Be Listed Below (If Applicable).     If you need a refill on your cardiac medications before your next appointment, please call your pharmacy.       Time spent: 25 minutes Signed, Shelva Majestic, [facility]  11/27/2017 8:44 PM    [place] [address], [place],  [place], [place]  [address] Phone: [phone]

## 2017-11-27 ENCOUNTER — Encounter: Payer: Self-pay | Admitting: Cardiovascular Disease

## 2018-02-15 ENCOUNTER — Telehealth: Payer: Self-pay | Admitting: *Deleted

## 2018-02-15 NOTE — Telephone Encounter (Signed)
Patient walked into office on 02/08/2018 - stated that he had a bruise on his arm that his pmd advised him to have someone take a look at.  Stated he was concerned due to him being on ASA and Plavix.   Observation of bruise on right deltoid area appeared to be healing bruising.  Patient did state that he noticed this on his arm about 2 weeks prior, but didn't remember doing anything to it.  Area did not feel warm to touch, no swelling, no knots and no red streaking anywhere on the upper arm.  Advised patient to continue to monitor area.    OV scheduled for 03/31/2018 with Dr. Bronson Ing here in [employer] - transferring from [place].

## 2018-03-02 ENCOUNTER — Ambulatory Visit: Payer: Medicare HMO | Admitting: Cardiovascular Disease

## 2018-03-24 ENCOUNTER — Ambulatory Visit: Payer: Medicare HMO | Admitting: Cardiology

## 2018-03-31 ENCOUNTER — Encounter: Payer: Self-pay | Admitting: Cardiovascular Disease

## 2018-03-31 ENCOUNTER — Ambulatory Visit: Payer: Medicare HMO | Admitting: Cardiovascular Disease

## 2018-03-31 VITALS — BP 115/76 | HR 93 | Ht 67.0 in | Wt 166.2 lb

## 2018-03-31 DIAGNOSIS — Z955 Presence of coronary angioplasty implant and graft: Secondary | ICD-10-CM | POA: Diagnosis not present

## 2018-03-31 DIAGNOSIS — E785 Hyperlipidemia, unspecified: Secondary | ICD-10-CM | POA: Diagnosis not present

## 2018-03-31 DIAGNOSIS — I25118 Atherosclerotic heart disease of native coronary artery with other forms of angina pectoris: Secondary | ICD-10-CM

## 2018-03-31 DIAGNOSIS — I10 Essential (primary) hypertension: Secondary | ICD-10-CM

## 2018-03-31 DIAGNOSIS — Z7182 Exercise counseling: Secondary | ICD-10-CM

## 2018-03-31 MED ORDER — ROSUVASTATIN CALCIUM 5 MG PO TABS: ORAL_TABLET | ORAL | 3 refills | Status: DC

## 2018-03-31 NOTE — Progress Notes (Signed)
SUBJECTIVE: The patient presents to establish care with me in our [place] office.  He has a history of coronary artery disease and underwent drug-eluting stent placement to the mid and distal LAD on 07/31/2017.  He had moderately severe stenosis in a small caliber intermediate branch and medical management was recommended.  There was moderate stenosis in the distal RCA, 50%.  Additional past medical history includes hypertension and type 2 diabetes mellitus.  He very seldom has chest pains.  He stopped taking atorvastatin a few months back as he had muscular pains in his legs as well as joint pains.  He ran out of Imdur sometime back as well.  He used to walk daily after he got his stents but then stopped due to laziness.  He used to play softball and baseball in his younger days.  He denies shortness of breath and palpitations.    Review of Systems: As per "subjective", otherwise negative.  Allergies  Allergen Reactions  . Bee Venom   . Fish Allergy Hives and Swelling  . Ivp Dye [Iodinated Diagnostic Agents] Swelling    Current Outpatient Medications  Medication Sig Dispense Refill  . allopurinol (ZYLOPRIM) 300 MG tablet Take 300 mg by mouth 2 (two) times daily.     Marland Kitchen aspirin EC 81 MG tablet Take 1 tablet (81 mg total) by mouth daily. 30 tablet 0  . clopidogrel (PLAVIX) 75 MG tablet Take 1 tablet (75 mg total) by mouth daily with breakfast. 30 tablet 11  . ezetimibe (ZETIA) 10 MG tablet Take 1 tablet (10 mg total) by mouth daily. 90 tablet 3  . irbesartan-hydrochlorothiazide (AVALIDE) 300-12.5 MG tablet Take 1 tablet by mouth daily. 30 tablet 5  . isosorbide mononitrate (IMDUR) 60 MG 24 hr tablet Take 1 tablet (60 mg total) by mouth daily. 90 tablet 3  . linaclotide (LINZESS) 145 MCG CAPS capsule Take 145 mcg by mouth daily as needed (for constipation). As needed    . metFORMIN (GLUCOPHAGE) 500 MG tablet Take 1 tablet (500 mg total) by mouth 2 (two) times daily with a meal.    .  metoprolol succinate (TOPROL XL) 25 MG 24 hr tablet Take 1.5 tablets (37.5 mg total) by mouth daily. 135 tablet 3  . nitroGLYCERIN (NITROSTAT) 0.4 MG SL tablet Place 1 tablet (0.4 mg total) under the tongue every 5 (five) minutes as needed for chest pain. 25 tablet 4  . oxymetazoline (12 HOUR NASAL SPRAY) 0.05 % nasal spray Place 1 spray into both nostrils as needed.     . pantoprazole (PROTONIX) 40 MG tablet Take 1 tablet (40 mg total) by mouth daily. 30 tablet 6  . Triamcinolone Acetonide (NASACORT AQ NA) Place into the nose as needed.    Marland Kitchen atorvastatin (LIPITOR) 40 MG tablet Take 1 tablet (40 mg total) by mouth daily at 6 PM. (Patient not taking: Reported on 03/31/2018) 90 tablet 3   No current facility-administered medications for this visit.     Past Medical History:  Diagnosis Date  . Arthritis   . Cancer ([place])   . Colon polyps   . Diabetes mellitus without complication ([place])   . Diverticulitis   . Hypertension   . IBS (irritable bowel syndrome)   . 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 Unstable angina ([place]) 07/2017    Past Surgical History:  Procedure Laterality Date  . ABDOMINAL SURGERY    . APPENDECTOMY    . BREAST SURGERY  benign lump at lt side  . COLON SURGERY    . COLONOSCOPY  03/14/2011   Procedure: COLONOSCOPY;  Surgeon: [name], MD;  [facility];  Service: Endoscopy;  Laterality: N/A;  1:00  . COLONOSCOPY N/A 11/15/2015   Procedure: COLONOSCOPY;  Surgeon: [name], MD;  [facility];  Service: Endoscopy;  Laterality: N/A;  11:15  . CORONARY STENT INTERVENTION N/A 07/31/2017   Procedure: CORONARY STENT INTERVENTION;  Surgeon: [name], MD;  Location: [facility];  Service: Cardiovascular;  Laterality: N/A;  . LEFT HEART CATH AND CORONARY ANGIOGRAPHY N/A 07/31/2017   Procedure: LEFT HEART CATH AND CORONARY ANGIOGRAPHY;  Surgeon: [name], MD;  Location: [facility];  Service: Cardiovascular;  Laterality: N/A;  . PENILE PROSTHESIS IMPLANT     2016   . prostate cancer     2015. prostectomy    Social History   Socioeconomic History  . Marital status: Married    Spouse name: Not on file  . Number of children: 2  . Years of education: Not on file  . Highest education level: Not on file  Occupational History    Employer: [employer]  Social Needs  . Financial resource strain: Not on file  . Food insecurity:    Worry: Not on file    Inability: Not on file  . Transportation needs:    Medical: Not on file    Non-medical: Not on file  Tobacco Use  . Smoking status: Former Smoker    Packs/day: 1.00    Years: 35.00    Pack years: 35.00    Types: Cigarettes    Last attempt to quit: 06/03/1999    Years since quitting: 18.8  . Smokeless tobacco: Never Used  . Tobacco comment: quit 16 years ago  Substance and Sexual Activity  . Alcohol use: Yes    Comment: occ  . Drug use: No    Comment: hx of use in his colege days  . Sexual activity: Not on file  Lifestyle  . Physical activity:    Days per week: Not on file    Minutes per session: Not on file  . Stress: Not on file  Relationships  . Social connections:    Talks on phone: Not on file    Gets together: Not on file    Attends religious service: Not on file    Active member of club or organization: Not on file    Attends meetings of clubs or organizations: Not on file    Relationship status: Not on file  . Intimate partner violence:    Fear of current or ex partner: Not on file    Emotionally abused: Not on file    Physically abused: Not on file    Forced sexual activity: Not on file  Other Topics Concern  . Not on file  Social History Narrative   Lives at home with wife and daughter and her three children.       Vitals:   03/31/18 1001  BP: 115/76  Pulse: 93  SpO2: 97%  Weight: 166 lb 3.2 oz (75.4 kg)  Height: 5\' 7"  (1.702 m)    Wt Readings from  Last 3 Encounters:  03/31/18 166 lb 3.2 oz (75.4 kg)  11/25/17 165 lb 3.2 oz (74.9 kg)  08/17/17 164 lb (74.4 kg)     PHYSICAL EXAM General: NAD HEENT: Normal. Neck: No JVD, no thyromegaly. Lungs: Clear  to auscultation bilaterally with normal respiratory effort. CV: Regular rate and rhythm, normal S1/S2, no S3/S4, no murmur. No pretibial or periankle edema.  No carotid bruit.   Abdomen: Soft, nontender, no distention.  Neurologic: Alert and oriented.  Psych: Normal affect. Skin: Normal. Musculoskeletal: No gross deformities.    ECG: Reviewed above under Subjective   Labs: Lab Results  Component Value Date/Time   K 3.7 08/01/2017 04:48 AM   BUN 13 08/01/2017 04:48 AM   CREATININE 0.95 08/01/2017 04:48 AM   HGB 12.2 (L) 08/01/2017 04:48 AM     Lipids: Lab Results  Component Value Date/Time   LDLCALC 99 05/27/2017 08:44 AM   CHOL 195 05/27/2017 08:44 AM   TRIG 253 (H) 05/27/2017 08:44 AM   HDL 45 05/27/2017 08:44 AM       ASSESSMENT AND PLAN: 1.  Coronary artery disease: Coronary angiography details reviewed above with mid and distal LAD stent placement in March 2019.  Continue dual antiplatelet therapy for 1 year.  He did not tolerate atorvastatin 40 mg daily.  I have asked him to try Crestor 5 mg 3 days a week on Monday, Wednesday, and Friday.  He is symptomatically stable and ran out of Imdur sometime back.  Continue Toprol-XL.    2.  Hypertension: Blood pressure is normal.  No changes to therapy.  3.  Hyperlipidemia: LDL at goal at 48 on 09/09/2017.  He did not tolerate atorvastatin 40 mg daily.  I have asked him to try Crestor 5 mg 3 days a week on Monday, Wednesday, and Friday.  I will obtain a copy of most recent lipids.  4.  Exercise counseling: I spoke to him at length about the importance of routine exercise.  I recommended he begin monitoring his daily step count and eventually aim for 10,000 steps daily.   Disposition: Follow up 6 months  Time spent: 40  minutes, of which greater than 50% was spent reviewing symptoms, relevant blood tests and studies, and discussing management plan with the patient.  Kate Sable, M.D., F.A.C.C.

## 2018-03-31 NOTE — Patient Instructions (Addendum)
Medication Instructions:   Stop Lipitor.   Change to Crestor 5mg  daily on Monday, Wednesday, & Friday only.  Remain off of the Imdur.  Continue all other medications.    Labwork: none   Testing/Procedures: none  Follow-Up: Your physician wants you to follow up in: 6 months.  You will receive a reminder letter in the mail one-two months in advance.  If you don't receive a letter, please call our office to schedule the follow up appointment   Any Other Special Instructions Will Be Listed Below (If Applicable).  If you need a refill on your cardiac medications before your next appointment, please call your pharmacy.

## 2018-03-31 NOTE — Addendum Note (Signed)
Addended by: Laurine Blazer on: 03/31/2018 10:36 AM   Modules accepted: Orders

## 2018-04-01 ENCOUNTER — Encounter: Payer: Self-pay | Admitting: *Deleted

## 2018-04-14 ENCOUNTER — Telehealth: Payer: Self-pay | Admitting: *Deleted

## 2018-04-14 NOTE — Telephone Encounter (Signed)
Labs received from pmd - per Dr. Bronson Ing review - LDL too high (132).  Continue Crestor 5mg  3 days a week and repeat Lipids in 3 months.    Patient notified and verbalized understanding.  Will mail reminder when time to repeat.  He will do at [facility].

## 2018-05-27 ENCOUNTER — Telehealth: Payer: Self-pay | Admitting: Cardiovascular Disease

## 2018-05-27 NOTE — Telephone Encounter (Signed)
Patient walk in asking to be seen sooner than April due to having chest pain everyday  Ronald Faulkner they come on quick.  Has not taken Nitro due to not lasting long time when it happens    He was not having chest pain at time he came in

## 2018-05-27 NOTE — Telephone Encounter (Signed)
Call placed to patient - stated that the chest pain does not last long.  Reviewed usage of Nitroglycerin (3 in 15 minute timeframe)  - stated his pain is quick and not prolonged.  Advised patient to go to ED for evaluation if symptoms worsen prior to OV.   Will fwd to provider for any suggestions in the meantime.

## 2018-05-27 NOTE — Telephone Encounter (Signed)
[place] pt, apt made for 06/07/2018 with Arnold Long NP   I will forward to [place]

## 2018-05-28 NOTE — Telephone Encounter (Signed)
Noted  

## 2018-05-28 NOTE — Telephone Encounter (Signed)
Keep Jan 6 appt, if worsening symptoms prior will need to be seen in ER   J Diedra Sinor MD

## 2018-06-04 ENCOUNTER — Ambulatory Visit: Payer: Medicare HMO | Admitting: Cardiovascular Disease

## 2018-06-04 ENCOUNTER — Encounter: Payer: Self-pay | Admitting: Cardiovascular Disease

## 2018-06-04 VITALS — BP 120/80 | HR 70 | Ht 67.0 in | Wt 167.0 lb

## 2018-06-04 DIAGNOSIS — E785 Hyperlipidemia, unspecified: Secondary | ICD-10-CM

## 2018-06-04 DIAGNOSIS — R079 Chest pain, unspecified: Secondary | ICD-10-CM

## 2018-06-04 DIAGNOSIS — R0602 Shortness of breath: Secondary | ICD-10-CM | POA: Diagnosis not present

## 2018-06-04 DIAGNOSIS — Z955 Presence of coronary angioplasty implant and graft: Secondary | ICD-10-CM

## 2018-06-04 DIAGNOSIS — I10 Essential (primary) hypertension: Secondary | ICD-10-CM

## 2018-06-04 DIAGNOSIS — I25118 Atherosclerotic heart disease of native coronary artery with other forms of angina pectoris: Secondary | ICD-10-CM

## 2018-06-04 MED ORDER — ISOSORBIDE MONONITRATE ER 30 MG PO TB24: 30.0000 mg | ORAL_TABLET | Freq: Every day | ORAL | 6 refills | Status: DC

## 2018-06-04 NOTE — Progress Notes (Signed)
SUBJECTIVE: The patient presents for the evaluation of chest pain.  He recently called our office stating he has had frequent chest pains.  I saw him once before on 03/31/2018.  In summary, he has a history of coronary artery disease and underwent drug-eluting stent placement to the mid and distal LAD on 07/31/2017.  He had moderately severe stenosis in a small caliber intermediate branch and medical management was recommended.  There was moderate stenosis in the distal RCA, 50%.  Additional past medical history includes hypertension and type 2 diabetes mellitus.  He tells me that for the past month he has had precordial pains occurring both with and without exertion.  Before he takes a nitroglycerin the symptoms resolve so he has not used any.  He has had associated shortness of breath.  He denies palpitations, orthopnea, and paroxysmal nocturnal dyspnea.  ECG performed in the office today which I ordered and personally interpreted demonstrates normal sinus rhythm with right bundle branch block and left posterior fascicular block.  He has had some muscle aches in both thighs with Crestor but not as severe as when he took Lipitor.    Review of Systems: As per "subjective", otherwise negative.  Allergies  Allergen Reactions  . Bee Venom   . Fish Allergy Hives and Swelling  . Ivp Dye [Iodinated Diagnostic Agents] Swelling    Current Outpatient Medications  Medication Sig Dispense Refill  . allopurinol (ZYLOPRIM) 300 MG tablet Take 300 mg by mouth 2 (two) times daily.     . clopidogrel (PLAVIX) 75 MG tablet Take 1 tablet (75 mg total) by mouth daily with breakfast. 30 tablet 11  . ezetimibe (ZETIA) 10 MG tablet Take 1 tablet (10 mg total) by mouth daily. 90 tablet 3  . irbesartan-hydrochlorothiazide (AVALIDE) 300-12.5 MG tablet Take 1 tablet by mouth daily. 30 tablet 5  . linaclotide (LINZESS) 145 MCG CAPS capsule Take 145 mcg by mouth daily as needed (for constipation). As needed     . metFORMIN (GLUCOPHAGE) 500 MG tablet Take 1 tablet (500 mg total) by mouth 2 (two) times daily with a meal.    . metoprolol succinate (TOPROL XL) 25 MG 24 hr tablet Take 1.5 tablets (37.5 mg total) by mouth daily. 135 tablet 3  . nitroGLYCERIN (NITROSTAT) 0.4 MG SL tablet Place 1 tablet (0.4 mg total) under the tongue every 5 (five) minutes as needed for chest pain. 25 tablet 4  . oxymetazoline (12 HOUR NASAL SPRAY) 0.05 % nasal spray Place 1 spray into both nostrils as needed.     . pantoprazole (PROTONIX) 40 MG tablet Take 1 tablet (40 mg total) by mouth daily. 30 tablet 6  . rosuvastatin (CRESTOR) 5 MG tablet Take 1 tab (5mg ) by mouth daily on Monday, Wednesday, and Friday only. 36 tablet 3  . Triamcinolone Acetonide (NASACORT AQ NA) Place into the nose as needed.     No current facility-administered medications for this visit.     Past Medical History:  Diagnosis Date  . Arthritis   . Cancer ([place])   . Colon polyps   . Diabetes mellitus without complication ([place])   . Diverticulitis   . Hypertension   . IBS (irritable bowel syndrome)   . 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 Unstable angina ([place]) 07/2017    Past Surgical History:  Procedure Laterality Date  . ABDOMINAL SURGERY    . APPENDECTOMY    . BREAST SURGERY  benign lump at lt side  . COLON SURGERY    . COLONOSCOPY  03/14/2011   Procedure: COLONOSCOPY;  Surgeon: [name], MD;  [facility];  Service: Endoscopy;  Laterality: N/A;  1:00  . COLONOSCOPY N/A 11/15/2015   Procedure: COLONOSCOPY;  Surgeon: [name], MD;  [facility];  Service: Endoscopy;  Laterality: N/A;  11:15  . CORONARY STENT INTERVENTION N/A 07/31/2017   Procedure: CORONARY STENT INTERVENTION;  Surgeon: [name], MD;  Location: [facility];  Service: Cardiovascular;  Laterality: N/A;  . LEFT HEART CATH AND CORONARY ANGIOGRAPHY N/A 07/31/2017   Procedure: LEFT  HEART CATH AND CORONARY ANGIOGRAPHY;  Surgeon: [name], MD;  Location: [facility];  Service: Cardiovascular;  Laterality: N/A;  . PENILE PROSTHESIS IMPLANT     2016   . prostate cancer     2015. prostectomy    Social History   Socioeconomic History  . Marital status: Married    Spouse name: Not on file  . Number of children: 2  . Years of education: Not on file  . Highest education level: Not on file  Occupational History    Employer: [employer]  Social Needs  . Financial resource strain: Not on file  . Food insecurity:    Worry: Not on file    Inability: Not on file  . Transportation needs:    Medical: Not on file    Non-medical: Not on file  Tobacco Use  . Smoking status: Former Smoker    Packs/day: 1.00    Years: 35.00    Pack years: 35.00    Types: Cigarettes    Last attempt to quit: 06/03/1999    Years since quitting: 19.0  . Smokeless tobacco: Never Used  . Tobacco comment: quit 16 years ago  Substance and Sexual Activity  . Alcohol use: Yes    Comment: occ  . Drug use: No    Comment: hx of use in his colege days  . Sexual activity: Not on file  Lifestyle  . Physical activity:    Days per week: Not on file    Minutes per session: Not on file  . Stress: Not on file  Relationships  . Social connections:    Talks on phone: Not on file    Gets together: Not on file    Attends religious service: Not on file    Active member of club or organization: Not on file    Attends meetings of clubs or organizations: Not on file    Relationship status: Not on file  . Intimate partner violence:    Fear of current or ex partner: Not on file    Emotionally abused: Not on file    Physically abused: Not on file    Forced sexual activity: Not on file  Other Topics Concern  . Not on file  Social History Narrative   Lives at home with wife and daughter and her three children.       Vitals:   06/04/18 0926  BP: 120/80  Pulse: 70    Weight: 167 lb (75.8 kg)  Height: 5\' 7"  (1.702 m)    Wt Readings from Last 3 Encounters:  06/04/18 167 lb (75.8 kg)  03/31/18 166 lb 3.2 oz (75.4 kg)  11/25/17 165 lb 3.2 oz (74.9 kg)     PHYSICAL EXAM General: NAD HEENT: Normal. Neck: No JVD, no thyromegaly. Lungs: Clear to auscultation bilaterally with  normal respiratory effort. CV: Regular rate and rhythm, normal S1/S2, no S3/S4, no murmur. No pretibial or periankle edema.  No carotid bruit.   Abdomen: Soft, nontender, no distention.  Neurologic: Alert and oriented.  Psych: Normal affect. Skin: Normal. Musculoskeletal: No gross deformities.    ECG: Reviewed above under Subjective   Labs: Lab Results  Component Value Date/Time   K 3.7 08/01/2017 04:48 AM   BUN 13 08/01/2017 04:48 AM   CREATININE 0.95 08/01/2017 04:48 AM   HGB 12.2 (L) 08/01/2017 04:48 AM     Lipids: Lab Results  Component Value Date/Time   LDLCALC 99 05/27/2017 08:44 AM   CHOL 195 05/27/2017 08:44 AM   TRIG 253 (H) 05/27/2017 08:44 AM   HDL 45 05/27/2017 08:44 AM       ASSESSMENT AND PLAN: 1.  Coronary artery disease: He is now experiencing chest pain and shortness of breath.  Coronary angiography details reviewed above with mid and distal LAD stent placement in March 2019.  Continue dual antiplatelet therapy for 1 year.  He did not tolerate atorvastatin 40 mg daily and is taking Crestor 3 days a week along with Zetia.  He does have some leg myalgias with this.  Continue Toprol-XL.   I will add Imdur 30 mg daily.  If symptoms do not subside, I would consider increasing the dose of Imdur as he may be experiencing symptoms related to the stenosis in the small caliber intermediate branch. If medical therapy fails, he would need cardiac catheterization.   2.  Hypertension: Blood pressure is normal.    I will monitor given addition of Imdur.  3.  Hyperlipidemia: LDL 132 on 03/24/2018.  He did not tolerate atorvastatin 40 mg daily.    He takes  Crestor 5 mg every Monday, Wednesday, and Friday.  He is also on Zetia.  He does have some myalgias but they are not as severe as when he took Lipitor.  I will repeat lipids.  He may need PCSK9 inhibitors.    Disposition: Follow up 2 weeks   Kate Sable, M.D., F.A.C.C.

## 2018-06-04 NOTE — Patient Instructions (Addendum)
Medication Instructions:   Begin Imdur 30mg  daily.   Continue all other medications.    Labwork:  FLP - order given today.  Reminder:  Nothing to eat or drink after 12 midnight prior to labs.  Office will contact with results via phone or letter.    Testing/Procedures: none  Follow-Up: 2 weeks     Any Other Special Instructions Will Be Listed Below (If Applicable).  If you need a refill on your cardiac medications before your next appointment, please call your pharmacy.

## 2018-06-07 ENCOUNTER — Ambulatory Visit: Payer: Medicare HMO | Admitting: Adult Health

## 2018-06-07 ENCOUNTER — Encounter

## 2018-06-10 ENCOUNTER — Telehealth: Payer: Self-pay | Admitting: Cardiovascular Disease

## 2018-06-10 NOTE — Telephone Encounter (Signed)
2 wk per Dr. Darden Amber office -needs to be re-scheduled.  1-7 LM asking patient to call back needs to be re-scheduled (staff meeting) 06/10/2018 Called patient, no answer, left message. Mailed a note to patient asking that he call [place].

## 2018-06-15 ENCOUNTER — Telehealth: Payer: Self-pay | Admitting: Student

## 2018-06-15 ENCOUNTER — Encounter: Payer: Self-pay | Admitting: Student

## 2018-06-15 NOTE — Telephone Encounter (Signed)
NEEDS TO HAVE UPCOMING APPOINTMENT RESCHEDULED  Called today trying to reach patient.  No answer - no VM available.

## 2018-06-23 ENCOUNTER — Emergency Department (HOSPITAL_COMMUNITY): Payer: Medicare HMO

## 2018-06-23 ENCOUNTER — Emergency Department (HOSPITAL_COMMUNITY)
Admission: EM | Admit: 2018-06-23 | Discharge: 2018-06-23 | Disposition: A | Discharge status: Home / Self Care | Payer: Medicare HMO | Attending: Emergency Medicine | Admitting: Emergency Medicine

## 2018-06-23 ENCOUNTER — Encounter (HOSPITAL_COMMUNITY): Payer: Self-pay | Admitting: Emergency Medicine

## 2018-06-23 ENCOUNTER — Other Ambulatory Visit: Payer: Self-pay

## 2018-06-23 ENCOUNTER — Telehealth (INDEPENDENT_AMBULATORY_CARE_PROVIDER_SITE_OTHER): Payer: Self-pay | Admitting: Internal Medicine

## 2018-06-23 DIAGNOSIS — K625 Hemorrhage of anus and rectum: Secondary | ICD-10-CM | POA: Diagnosis present

## 2018-06-23 DIAGNOSIS — K5733 Diverticulitis of large intestine without perforation or abscess with bleeding: Secondary | ICD-10-CM | POA: Insufficient documentation

## 2018-06-23 DIAGNOSIS — Z87891 Personal history of nicotine dependence: Secondary | ICD-10-CM | POA: Diagnosis not present

## 2018-06-23 DIAGNOSIS — I10 Essential (primary) hypertension: Secondary | ICD-10-CM | POA: Insufficient documentation

## 2018-06-23 DIAGNOSIS — Z859 Personal history of malignant neoplasm, unspecified: Secondary | ICD-10-CM | POA: Diagnosis not present

## 2018-06-23 DIAGNOSIS — E119 Type 2 diabetes mellitus without complications: Secondary | ICD-10-CM | POA: Insufficient documentation

## 2018-06-23 LAB — CBC WITH DIFFERENTIAL/PLATELET
Abs Immature Granulocytes: 0.02 10*3/uL (ref 0.00–0.07)
BASOS ABS: 0 10*3/uL (ref 0.0–0.1)
Basophils Relative: 1 %
Eosinophils Absolute: 0.1 10*3/uL (ref 0.0–0.5)
Eosinophils Relative: 2 %
HCT: 40.3 % (ref 39.0–52.0)
HEMOGLOBIN: 12.2 g/dL — AB (ref 13.0–17.0)
Immature Granulocytes: 0 %
LYMPHS PCT: 35 %
Lymphs Abs: 2 10*3/uL (ref 0.7–4.0)
MCH: 26 pg (ref 26.0–34.0)
MCHC: 30.3 g/dL (ref 30.0–36.0)
MCV: 85.9 fL (ref 80.0–100.0)
Monocytes Absolute: 0.4 10*3/uL (ref 0.1–1.0)
Monocytes Relative: 8 %
Neutro Abs: 3.1 10*3/uL (ref 1.7–7.7)
Neutrophils Relative %: 54 %
Platelets: 253 10*3/uL (ref 150–400)
RBC: 4.69 MIL/uL (ref 4.22–5.81)
RDW: 14.9 % (ref 11.5–15.5)
WBC: 5.7 10*3/uL (ref 4.0–10.5)
nRBC: 0 % (ref 0.0–0.2)

## 2018-06-23 LAB — URINALYSIS, ROUTINE W REFLEX MICROSCOPIC
Bilirubin Urine: NEGATIVE
Glucose, UA: NEGATIVE mg/dL
HGB URINE DIPSTICK: NEGATIVE
Ketones, ur: NEGATIVE mg/dL
Leukocytes, UA: NEGATIVE
NITRITE: NEGATIVE
Protein, ur: NEGATIVE mg/dL
Specific Gravity, Urine: 1.011 (ref 1.005–1.030)
pH: 7 (ref 5.0–8.0)

## 2018-06-23 LAB — LIPASE, BLOOD: Lipase: 39 U/L (ref 11–51)

## 2018-06-23 LAB — COMPREHENSIVE METABOLIC PANEL
ALT: 21 U/L (ref 0–44)
ANION GAP: 10 (ref 5–15)
AST: 32 U/L (ref 15–41)
Albumin: 3.8 g/dL (ref 3.5–5.0)
Alkaline Phosphatase: 95 U/L (ref 38–126)
BUN: 14 mg/dL (ref 8–23)
CO2: 24 mmol/L (ref 22–32)
Calcium: 9.1 mg/dL (ref 8.9–10.3)
Chloride: 104 mmol/L (ref 98–111)
Creatinine, Ser: 0.98 mg/dL (ref 0.61–1.24)
Glucose, Bld: 116 mg/dL — ABNORMAL HIGH (ref 70–99)
Potassium: 5.2 mmol/L — ABNORMAL HIGH (ref 3.5–5.1)
Sodium: 138 mmol/L (ref 135–145)
Total Bilirubin: 1.8 mg/dL — ABNORMAL HIGH (ref 0.3–1.2)
Total Protein: 7.6 g/dL (ref 6.5–8.1)

## 2018-06-23 LAB — PROTIME-INR
INR: 0.92
Prothrombin Time: 12.2 seconds (ref 11.4–15.2)

## 2018-06-23 LAB — TYPE AND SCREEN
ABO/RH(D): B POS
Antibody Screen: NEGATIVE

## 2018-06-23 MED ORDER — SULFAMETHOXAZOLE-TRIMETHOPRIM 800-160 MG PO TABS: 1.0000 | ORAL_TABLET | Freq: Two times a day (BID) | ORAL | 0 refills | Status: DC

## 2018-06-23 MED ORDER — SULFAMETHOXAZOLE-TRIMETHOPRIM 800-160 MG PO TABS
1.0000 | ORAL_TABLET | Freq: Once | ORAL | Status: AC
  Administered 2018-06-23: 1 via ORAL
  Filled 2018-06-23: qty 1

## 2018-06-23 MED ORDER — HYDROCODONE-ACETAMINOPHEN 5-325 MG PO TABS: 1.0000 | ORAL_TABLET | Freq: Four times a day (QID) | ORAL | 0 refills | Status: AC | PRN

## 2018-06-23 MED ORDER — METRONIDAZOLE 500 MG PO TABS
500.0000 mg | ORAL_TABLET | Freq: Once | ORAL | Status: AC
  Administered 2018-06-23: 500 mg via ORAL
  Filled 2018-06-23: qty 1

## 2018-06-23 MED ORDER — SODIUM CHLORIDE 0.9 % IV BOLUS
500.0000 mL | Freq: Once | INTRAVENOUS | Status: AC
  Administered 2018-06-23: 500 mL via INTRAVENOUS

## 2018-06-23 MED ORDER — METRONIDAZOLE 500 MG PO TABS: 500.0000 mg | ORAL_TABLET | Freq: Three times a day (TID) | ORAL | 0 refills | Status: AC

## 2018-06-23 MED ORDER — ONDANSETRON 4 MG PO TBDP: 4.0000 mg | ORAL_TABLET | Freq: Three times a day (TID) | ORAL | 0 refills | Status: DC | PRN

## 2018-06-23 NOTE — ED Notes (Signed)
Pt verbalized understanding of no driving and to use caution within 4 hours of taking pain meds due to meds cause drowsiness.  Instructed pt to take all of antibiotics as prescribed. 

## 2018-06-23 NOTE — ED Provider Notes (Signed)
Emergency Department Provider Note   I have reviewed the triage vital signs and the nursing notes.   HISTORY  Chief Complaint Rectal Bleeding   HPI Ronald Faulkner is a 68 y.o. male with PMH of DM, HTN, IBS, Diverticulitis, and colon polyps reportedly requiring bowel resection at outside hospital presents with left lower quadrant abdominal pain which is been intermittent over the past month but worsening over the past several days and new rectal bleeding.  The patient states that this morning or getting ready for work he had a sensation that he needed to have a bowel movement.  He went to the toilet and saw a small amount of bright red blood initially.  Shortly afterwards he felt like he needed to go again so sat back down and passed a large volume of bright red blood.  He denies any rectal pain.  No history of rectal bleeding in the past.  He does take Plavix.  He states that he has had a colonoscopy with Dr. Laural Golden in the past.  Denies fevers or chills.  No UTI symptoms.   Past Medical History:  Diagnosis Date  . Arthritis   . Cancer ([place])   . Colon polyps   . Diabetes mellitus without complication ([place])   . Diverticulitis   . Hypertension   . IBS (irritable bowel syndrome)   . 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 Unstable angina ([place]) 07/2017    Patient Active Problem List   Diagnosis Date Noted  . Unstable angina ([place]) 07/29/2017  . Type 2 diabetes mellitus ([name]) 05/27/2017  . Bilateral lower extremity edema   . SOB (shortness of breath)   . Family hx of colon cancer 09/18/2015  . Gout 09/18/2015  . Rectal bleeding 04/05/2012  . GERD (gastroesophageal reflux disease) 04/05/2012  . Atypical chest pain 12/08/2011  . Abnormal EKG 12/08/2011  . HTN (hypertension) 12/08/2011    Past Surgical History:  Procedure Laterality Date  . ABDOMINAL SURGERY    . APPENDECTOMY    . BREAST SURGERY     benign lump at lt side  . COLON SURGERY     . COLONOSCOPY  03/14/2011   Procedure: COLONOSCOPY;  Surgeon: [name], MD;  Location: [facility];  Service: Endoscopy;  Laterality: N/A;  1:00  . COLONOSCOPY N/A 11/15/2015   Procedure: COLONOSCOPY;  Surgeon: [name], MD;  Location: [facility];  Service: Endoscopy;  Laterality: N/A;  11:15  . CORONARY STENT INTERVENTION N/A 07/31/2017   Procedure: CORONARY STENT INTERVENTION;  Surgeon: [name], MD;  Location: [facility];  Service: Cardiovascular;  Laterality: N/A;  . LEFT HEART CATH AND CORONARY ANGIOGRAPHY N/A 07/31/2017   Procedure: LEFT HEART CATH AND CORONARY ANGIOGRAPHY;  Surgeon: [name], MD;  Location: [facility];  Service: Cardiovascular;  Laterality: N/A;  . PENILE PROSTHESIS IMPLANT     2016   . prostate cancer     2015. prostectomy    Allergies Bee venom; Fish allergy; and Ivp dye [iodinated diagnostic agents]  Family History  Problem Relation Age of Onset  . Colon cancer Mother   . Coronary artery disease Maternal Grandmother   . Anesthesia problems Neg Hx   . Hypotension Neg Hx   . Malignant hyperthermia Neg Hx   . Pseudochol deficiency Neg Hx     Social History Social History   Tobacco Use  . Smoking status: Former Smoker  Packs/day: 1.00    Years: 35.00    Pack years: 35.00    Types: Cigarettes    Last attempt to quit: 06/03/1999    Years since quitting: 19.0  . Smokeless tobacco: Never Used  . Tobacco comment: quit 16 years ago  Substance Use Topics  . Alcohol use: Yes    Comment: occ  . Drug use: No    Comment: hx of use in his colege days    Review of Systems  Constitutional: No fever/chills Eyes: No visual changes. ENT: No sore throat. Cardiovascular: Denies chest pain. Respiratory: Denies shortness of breath. Gastrointestinal: Positive LLQ abdominal pain.  No nausea, no vomiting. Positive diarrhea with blood.  No constipation. Genitourinary: Negative for  dysuria. Musculoskeletal: Negative for back pain. Skin: Negative for rash. Neurological: Negative for headaches, focal weakness or numbness.  10-point ROS otherwise negative.  ____________________________________________   PHYSICAL EXAM:  VITAL SIGNS: ED Triage Vitals  Enc Vitals Group     BP 06/23/18 0940 114/73     Pulse Rate 06/23/18 0940 64     Resp 06/23/18 0940 18     Temp 06/23/18 0940 97.8 F (36.6 C)     Temp Source 06/23/18 0940 Oral     SpO2 06/23/18 0940 100 %     Weight 06/23/18 0941 166 lb (75.3 kg)     Height 06/23/18 0941 5\' 7"  (1.702 m)     Pain Score 06/23/18 0940 1   Constitutional: Alert and oriented. Well appearing and in no acute distress. Eyes: Conjunctivae are normal.  Head: Atraumatic. Nose: No congestion/rhinnorhea. Mouth/Throat: Mucous membranes are moist.  Oropharynx non-erythematous. Neck: No stridor.   Cardiovascular: Normal rate, regular rhythm. Good peripheral circulation. Grossly normal heart sounds.   Respiratory: Normal respiratory effort.  No retractions. Lungs CTAB. Gastrointestinal: Soft with focal left sided abdominal tenderness in both the upper and lower quadrants. No distention.  Musculoskeletal: No lower extremity tenderness nor edema. No gross deformities of extremities. Neurologic:  Normal speech and language. No gross focal neurologic deficits are appreciated.  Skin:  Skin is warm, dry and intact. No rash noted.  ____________________________________________   LABS (all labs ordered are listed, but only abnormal results are displayed)  Labs Reviewed  COMPREHENSIVE METABOLIC PANEL - Abnormal; Notable for the following components:      Result Value   Potassium 5.2 (*)    Glucose, Bld 116 (*)    Total Bilirubin 1.8 (*)    All other components within normal limits  CBC WITH DIFFERENTIAL/PLATELET - Abnormal; Notable for the following components:   Hemoglobin 12.2 (*)    All other components within normal limits  LIPASE,  BLOOD  URINALYSIS, ROUTINE W REFLEX MICROSCOPIC  PROTIME-INR  TYPE AND SCREEN   ____________________________________________  RADIOLOGY  Ct Abdomen Pelvis Wo Contrast  Result Date: 06/23/2018 CLINICAL DATA:  Abdominal pain with nausea EXAM: CT ABDOMEN AND PELVIS WITHOUT CONTRAST TECHNIQUE: Multidetector CT imaging of the abdomen and pelvis was performed following the standard protocol without IV contrast. COMPARISON:  None. FINDINGS: Lower chest: Mild atelectatic changes are noted in the bases bilaterally. No effusion or focal infiltrate is seen. Hepatobiliary: No focal liver abnormality is seen. No gallstones, gallbladder wall thickening, or biliary dilatation. Pancreas: Unremarkable. No pancreatic ductal dilatation or surrounding inflammatory changes. Spleen: Normal in size without focal abnormality. Adrenals/Urinary Tract: Adrenal glands are within normal limits bilaterally. Kidneys are well visualized bilaterally with nonobstructing stone in the lower pole of the right kidney measuring 6 mm. 3.1 cm  hypodense lesion is noted within the right kidney consistent with cyst. No left renal calculi are seen. No obstructive changes are noted. The bladder is decompressed. Stomach/Bowel: Scattered diverticular changes noted throughout the colon. There are changes consistent with diverticulitis in the mid transverse colon and proximal ascending colon without abscess formation or are focal perforation. Stomach is decompressed. No small bowel abnormality is seen. The appendix has been surgically removed. Postsurgical changes in the sigmoid colon are noted. Vascular/Lymphatic: Aortic atherosclerosis. No enlarged abdominal or pelvic lymph nodes. Circumaortic left renal vein is noted. Reproductive: Penile implant is noted with reservoir deep within the pelvis. Prostate has been surgically removed. Other: No ascites is noted. Small omental fat containing supraumbilical hernia is noted. Musculoskeletal: Degenerative  changes of lumbar spine are noted. IMPRESSION: Changes consistent with diverticulitis in the mid transverse and proximal ascending colon without evidence of abscess or perforation. Small hernia in the anterior abdominal wall containing omental fat is noted. Nonobstructing right renal stone. Hypodense lesion likely representing right renal cyst. Electronically Signed   By: Inez Catalina M.D.   On: 06/23/2018 15:00    ____________________________________________   PROCEDURES  Procedure(s) performed:   Procedures  None ____________________________________________   INITIAL IMPRESSION / ASSESSMENT AND PLAN / ED COURSE  Pertinent labs & imaging results that were available during my care of the patient were reviewed by me and considered in my medical decision making (see chart for details).  Patient presents to the emergency department with left lower quadrant abdominal pain and blood in the bowel movements.  No fevers or chills.  He does have focal tenderness on exam.  Clinically my suspicion for diverticulitis is elevated.  He does have history of colon polyps requiring resection of small segment of bowel but in the setting of pain I feel this is less likely.  I will obtain a CT scan without contrast given his history of IV contrast reaction.  He states he had reaction last time even after premedication.   CT with acute, uncomplicated diverticulitis. This correlates with the are of pain. No fever. No perforation or abscess noted. Discussed with patient who is feeling overall well. He is comfortable with outpatient treatment trial and GI follow up in 2-3 weeks for consideration of colonoscopy. Discussed ED return precautions in detail.  ____________________________________________  FINAL CLINICAL IMPRESSION(S) / ED DIAGNOSES  Final diagnoses:  Diverticulitis of large intestine without perforation or abscess with bleeding     MEDICATIONS GIVEN DURING THIS VISIT:  Medications  sodium  chloride 0.9 % bolus 500 mL (0 mLs Intravenous Stopped 06/23/18 1533)  sulfamethoxazole-trimethoprim (BACTRIM DS,SEPTRA DS) 800-160 MG per tablet 1 tablet (1 tablet Oral Given 06/23/18 1530)  metroNIDAZOLE (FLAGYL) tablet 500 mg (500 mg Oral Given 06/23/18 1530)     NEW OUTPATIENT MEDICATIONS STARTED DURING THIS VISIT:  Discharge Medication List as of 06/23/2018  3:24 PM    START taking these medications   Details  HYDROcodone-acetaminophen (NORCO/VICODIN) 5-325 MG tablet Take 1 tablet by mouth every 6 (six) hours as needed for up to 3 days for severe pain., Starting Wed 06/23/2018, Until Sat 06/26/2018, Normal    metroNIDAZOLE (FLAGYL) 500 MG tablet Take 1 tablet (500 mg total) by mouth 3 (three) times daily for 7 days., Starting Wed 06/23/2018, Until Wed 06/30/2018, Normal    ondansetron (ZOFRAN ODT) 4 MG disintegrating tablet Take 1 tablet (4 mg total) by mouth every 8 (eight) hours as needed for nausea or vomiting., Starting Wed 06/23/2018, Normal    sulfamethoxazole-trimethoprim (  BACTRIM DS,SEPTRA DS) 800-160 MG tablet Take 1 tablet by mouth 2 (two) times daily for 7 days., Starting Wed 06/23/2018, Until Wed 06/30/2018, Normal        Note:  This document was prepared using Dragon voice recognition software and may include unintentional dictation errors.  Nanda Quinton, MD Emergency Medicine    Long, Wonda [facility], MD 06/23/18 [phone]

## 2018-06-23 NOTE — Discharge Instructions (Signed)
We believe your symptoms are caused by diverticulitis.  Most of the time this condition (please read through the included information) can be cured with outpatient antibiotics.  Please take the full course of prescribed medication(s) and follow up with the doctors recommended above.  Return to the ED if your abdominal pain worsens or fails to improve, you develop bloody vomiting, bloody diarrhea, you are unable to tolerate fluids due to vomiting, fever greater than 101, or other symptoms that concern you.  Take Vicodin as prescribed for severe pain. Do not drink alcohol, drive or participate in any other potentially dangerous activities while taking this medication as it may make you sleepy. Do not take this medication with any other sedating medications, either prescription or over-the-counter. If you were prescribed Percocet or Vicodin, do not take these with acetaminophen (Tylenol) as it is already contained within these medications.   This medication is an opiate (or narcotic) pain medication and can be habit forming.  Use it as little as possible to achieve adequate pain control.  Do not use or use it with extreme caution if you have a history of opiate abuse or dependence. This medication is intended for your use only - do not give any to anyone else and keep it in a secure place where nobody else, especially children, have access to it.  It will also cause or worsen constipation, so you may want to consider taking an over-the-counter stool softener while you are taking this medication.

## 2018-06-23 NOTE — ED Triage Notes (Signed)
Pt states has been having lower abd pain for past few days, nausea but denies vomiting. Pt reports during BM this morning noticed BRB when wiping. NAD noted.

## 2018-06-23 NOTE — Telephone Encounter (Signed)
Patient called stated he went to the bathroom and there was a significant amount of blood in the toilet - was told he probably needs to go to the emergency dept

## 2018-06-23 NOTE — Telephone Encounter (Signed)
noted 

## 2018-06-24 ENCOUNTER — Ambulatory Visit: Payer: Medicare HMO | Admitting: Student

## 2018-06-25 ENCOUNTER — Encounter: Payer: Self-pay | Admitting: Student

## 2018-06-28 ENCOUNTER — Other Ambulatory Visit: Payer: Self-pay

## 2018-06-28 ENCOUNTER — Encounter (HOSPITAL_COMMUNITY): Payer: Self-pay

## 2018-06-28 ENCOUNTER — Emergency Department (HOSPITAL_COMMUNITY)
Admission: EM | Admit: 2018-06-28 | Discharge: 2018-06-28 | Disposition: A | Discharge status: Home / Self Care | Payer: Medicare HMO | Attending: Emergency Medicine | Admitting: Emergency Medicine

## 2018-06-28 DIAGNOSIS — I10 Essential (primary) hypertension: Secondary | ICD-10-CM | POA: Diagnosis not present

## 2018-06-28 DIAGNOSIS — Z79899 Other long term (current) drug therapy: Secondary | ICD-10-CM | POA: Diagnosis not present

## 2018-06-28 DIAGNOSIS — E119 Type 2 diabetes mellitus without complications: Secondary | ICD-10-CM | POA: Insufficient documentation

## 2018-06-28 DIAGNOSIS — K5733 Diverticulitis of large intestine without perforation or abscess with bleeding: Secondary | ICD-10-CM | POA: Diagnosis not present

## 2018-06-28 DIAGNOSIS — R1032 Left lower quadrant pain: Secondary | ICD-10-CM | POA: Diagnosis present

## 2018-06-28 LAB — COMPREHENSIVE METABOLIC PANEL
ALBUMIN: 4 g/dL (ref 3.5–5.0)
ALT: 34 U/L (ref 0–44)
AST: 34 U/L (ref 15–41)
Alkaline Phosphatase: 92 U/L (ref 38–126)
Anion gap: 10 (ref 5–15)
BUN: 21 mg/dL (ref 8–23)
CO2: 22 mmol/L (ref 22–32)
Calcium: 9.3 mg/dL (ref 8.9–10.3)
Chloride: 105 mmol/L (ref 98–111)
Creatinine, Ser: 1.29 mg/dL — ABNORMAL HIGH (ref 0.61–1.24)
GFR calc Af Amer: >60 mL/min (ref >60)
GFR calc non Af Amer: 57 mL/min — ABNORMAL LOW (ref >60)
Glucose, Bld: 100 mg/dL — ABNORMAL HIGH (ref 70–99)
Potassium: 4 mmol/L (ref 3.5–5.1)
Sodium: 137 mmol/L (ref 135–145)
Total Bilirubin: 0.4 mg/dL (ref 0.3–1.2)
Total Protein: 7.7 g/dL (ref 6.5–8.1)

## 2018-06-28 LAB — URINALYSIS, ROUTINE W REFLEX MICROSCOPIC
Bacteria, UA: NONE SEEN
Bilirubin Urine: NEGATIVE
Glucose, UA: NEGATIVE mg/dL
Hgb urine dipstick: NEGATIVE
KETONES UR: NEGATIVE mg/dL
Nitrite: NEGATIVE
Protein, ur: NEGATIVE mg/dL
Specific Gravity, Urine: 1.018 (ref 1.005–1.030)
pH: 5 (ref 5.0–8.0)

## 2018-06-28 LAB — CBC WITH DIFFERENTIAL/PLATELET
Abs Immature Granulocytes: 0.03 10*3/uL (ref 0.00–0.07)
Basophils Absolute: 0.1 10*3/uL (ref 0.0–0.1)
Basophils Relative: 1 %
Eosinophils Absolute: 0.1 10*3/uL (ref 0.0–0.5)
Eosinophils Relative: 1 %
HCT: 38.7 % — ABNORMAL LOW (ref 39.0–52.0)
HEMOGLOBIN: 12 g/dL — AB (ref 13.0–17.0)
Immature Granulocytes: 0 %
LYMPHS ABS: 2.1 10*3/uL (ref 0.7–4.0)
Lymphocytes Relative: 28 %
MCH: 26.7 pg (ref 26.0–34.0)
MCHC: 31 g/dL (ref 30.0–36.0)
MCV: 86 fL (ref 80.0–100.0)
Monocytes Absolute: 0.7 10*3/uL (ref 0.1–1.0)
Monocytes Relative: 9 %
Neutro Abs: 4.6 10*3/uL (ref 1.7–7.7)
Neutrophils Relative %: 61 %
Platelets: 280 10*3/uL (ref 150–400)
RBC: 4.5 MIL/uL (ref 4.22–5.81)
RDW: 15.2 % (ref 11.5–15.5)
WBC: 7.6 10*3/uL (ref 4.0–10.5)
nRBC: 0 % (ref 0.0–0.2)

## 2018-06-28 LAB — POC OCCULT BLOOD, ED: Fecal Occult Bld: POSITIVE — AB

## 2018-06-28 LAB — LIPASE, BLOOD: Lipase: 47 U/L (ref 11–51)

## 2018-06-28 MED ORDER — METRONIDAZOLE 500 MG PO TABS: 500.0000 mg | ORAL_TABLET | Freq: Three times a day (TID) | ORAL | 0 refills | Status: DC

## 2018-06-28 MED ORDER — PROMETHAZINE HCL 25 MG PO TABS: 25.0000 mg | ORAL_TABLET | Freq: Four times a day (QID) | ORAL | 0 refills | Status: DC | PRN

## 2018-06-28 MED ORDER — CIPROFLOXACIN HCL 500 MG PO TABS: 500.0000 mg | ORAL_TABLET | Freq: Two times a day (BID) | ORAL | 0 refills | Status: DC

## 2018-06-28 MED ORDER — SODIUM CHLORIDE 0.9 % IV BOLUS
1000.0000 mL | Freq: Once | INTRAVENOUS | Status: AC
  Administered 2018-06-28: 1000 mL via INTRAVENOUS

## 2018-06-28 NOTE — ED Provider Notes (Signed)
[facility] EMERGENCY DEPARTMENT Provider Note   CSN: [id number] Arrival date & time: 06/28/18  0940     History   Chief Complaint Chief Complaint  Patient presents with  . GI Bleeding    HPI   HPI   Ronald Faulkner is a 68 y.o. male with a history of diabetes mellitus, colon polyps, IBS, diverticulitis, unstable angina, and hypertension and partial bowel resection who presents to the Emergency Department complaining of continued left lower abdominal pain and bright red blood per rectum.  He was seen here 5 days ago for left lower abdominal pain and rectal bleeding.  He was found to have acute diverticulitis and started on Flagyl and Bactrim.  He states that he was feeling better after discharge home, but did not have a bowel movement again until yesterday and at that time he reports that he had a small stool that was hard and streaked with "black blood."  Within a few minutes he had another "explosive" stool that was mostly bright red blood.  He continues to have left lower abdominal pain that has not increased in severity.  His symptoms have been associated with generalized weakness nausea.  He denies fever or vomiting.  No dizziness or syncope.  He does report increased flatus.  He does take plavix and ASA.  He contacted his GI doctor, Dr. Dereck Leep and has an appointment for follow-up this Thursday.  Last colonoscopy per patient was 2-1/2 years ago.    Past Medical History:  Diagnosis Date  . Arthritis   . Cancer ([place])   . Colon polyps   . Diabetes mellitus without complication ([place])   . Diverticulitis   . Hypertension   . IBS (irritable bowel syndrome)   . 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 Unstable angina ([place]) 07/2017    Patient Active Problem List   Diagnosis Date Noted  . Unstable angina ([place]) 07/29/2017  . Type 2 diabetes mellitus ([place]) 05/27/2017  . Bilateral lower extremity edema   . SOB (shortness of breath)   . Family hx of  colon cancer 09/18/2015  . Gout 09/18/2015  . Rectal bleeding 04/05/2012  . GERD (gastroesophageal reflux disease) 04/05/2012  . Atypical chest pain 12/08/2011  . Abnormal EKG 12/08/2011  . HTN (hypertension) 12/08/2011    Past Surgical History:  Procedure Laterality Date  . ABDOMINAL SURGERY    . APPENDECTOMY    . BREAST SURGERY     benign lump at lt side  . COLON SURGERY    . COLONOSCOPY  03/14/2011   Procedure: COLONOSCOPY;  Surgeon: [name], MD;  [facility];  Service: Endoscopy;  Laterality: N/A;  1:00  . COLONOSCOPY N/A 11/15/2015   Procedure: COLONOSCOPY;  Surgeon: [name], MD;  Location[facility];  Service: Endoscopy;  Laterality: N/A;  11:15  . CORONARY STENT INTERVENTION N/A 07/31/2017   Procedure: CORONARY STENT INTERVENTION;  Surgeon: [name], MD;  Location: [place] CV LAB;  Service: Cardiovascular;  Laterality: N/A;  . LEFT HEART CATH AND CORONARY ANGIOGRAPHY N/A 07/31/2017   Procedure: LEFT HEART CATH AND CORONARY ANGIOGRAPHY;  Surgeon: [name], MD;  Location: [place] CV LAB;  Service: Cardiovascular;  Laterality: N/A;  . PENILE PROSTHESIS IMPLANT     2016   . prostate cancer     2015. prostectomy        Home Medications    Prior to Admission medications  Medication Sig Start Date End Date Taking? Authorizing Provider  allopurinol (ZYLOPRIM) 300 MG tablet Take 300 mg by mouth 2 (two) times daily.  11/10/11   [provider]  aspirin EC 81 MG tablet Take 81 mg by mouth daily.    [provider]  clopidogrel (PLAVIX) 75 MG tablet Take 1 tablet (75 mg total) by mouth daily with breakfast. 08/17/17   Lendon Colonel, NP  ezetimibe (ZETIA) 10 MG tablet Take 1 tablet (10 mg total) by mouth daily. 11/25/17 04/01/19  Troy Sine, MD  hydrochlorothiazide (HYDRODIURIL) 25 MG tablet Take 1 tablet by mouth daily. 05/10/18   [provider]  isosorbide mononitrate (IMDUR) 30  MG 24 hr tablet Take 1 tablet (30 mg total) by mouth daily. 06/04/18 09/02/18  Herminio Commons, MD  linaclotide (LINZESS) 145 MCG CAPS capsule Take 145 mcg by mouth daily as needed (for constipation). As needed    [provider]  losartan (COZAAR) 100 MG tablet Take 100 mg by mouth daily. 05/10/18   [provider]  metFORMIN (GLUCOPHAGE) 500 MG tablet Take 1 tablet (500 mg total) by mouth 2 (two) times daily with a meal. 08/03/17   Isaiah Serge, NP  metoprolol succinate (TOPROL XL) 25 MG 24 hr tablet Take 1.5 tablets (37.5 mg total) by mouth daily. 11/25/17   Troy Sine, MD  metroNIDAZOLE (FLAGYL) 500 MG tablet Take 1 tablet (500 mg total) by mouth 3 (three) times daily for 7 days. 06/23/18 06/30/18  Long, Wonda Olds, MD  nitroGLYCERIN (NITROSTAT) 0.4 MG SL tablet Place 1 tablet (0.4 mg total) under the tongue every 5 (five) minutes as needed for chest pain. 08/17/17   Lendon Colonel, NP  ondansetron (ZOFRAN ODT) 4 MG disintegrating tablet Take 1 tablet (4 mg total) by mouth every 8 (eight) hours as needed for nausea or vomiting. 06/23/18   Long, Wonda Olds, MD  oxymetazoline (12 HOUR NASAL SPRAY) 0.05 % nasal spray Place 1 spray into both nostrils as needed.     [provider]  rosuvastatin (CRESTOR) 5 MG tablet Take 1 tab (5mg ) by mouth daily on Monday, Wednesday, and Friday only. 03/31/18   Herminio Commons, MD  sulfamethoxazole-trimethoprim (BACTRIM DS,SEPTRA DS) 800-160 MG tablet Take 1 tablet by mouth 2 (two) times daily for 7 days. 06/23/18 06/30/18  Long, Wonda Olds, MD    Family History Family History  Problem Relation Age of Onset  . Colon cancer Mother   . Coronary artery disease Maternal Grandmother   . Anesthesia problems Neg Hx   . Hypotension Neg Hx   . Malignant hyperthermia Neg Hx   . Pseudochol deficiency Neg Hx     Social History Social History   Tobacco Use  . Smoking status: Former Smoker    Packs/day: 1.00    Years: 35.00    Pack  years: 35.00    Types: Cigarettes    Last attempt to quit: 06/03/1999    Years since quitting: 19.0  . Smokeless tobacco: Never Used  . Tobacco comment: quit 16 years ago  Substance Use Topics  . Alcohol use: Yes    Comment: occ  . Drug use: No    Comment: hx of use in his colege days     Allergies   Bee venom; Fish allergy; and Ivp dye [iodinated diagnostic agents]   Review of Systems Review of Systems  Constitutional: Negative for appetite change, chills and fever.  Respiratory: Negative for chest tightness and  shortness of breath.   Cardiovascular: Negative for chest pain.  Gastrointestinal: Positive for abdominal pain, blood in stool and nausea. Negative for diarrhea and vomiting.  Genitourinary: Negative for decreased urine volume, difficulty urinating, dysuria and flank pain.  Musculoskeletal: Negative for back pain and myalgias.  Skin: Negative for color change and rash.  Neurological: Negative for dizziness, weakness (Generalized weakness) and numbness.  Hematological: Negative for adenopathy.     Physical Exam Updated Vital Signs BP 108/64 (BP Location: Left Arm)   Pulse 66   Temp 98.3 F (36.8 C) (Oral)   Resp 16   Ht 5\' 7"  (1.702 m)   Wt 74.8 kg   SpO2 100%   BMI 25.84 kg/m   Physical Exam Vitals signs and nursing note reviewed.  Constitutional:      General: He is not in acute distress.    Appearance: Normal appearance. He is not ill-appearing or toxic-appearing.  HENT:     Head: Normocephalic.     Mouth/Throat:     Mouth: Mucous membranes are moist.     Pharynx: Oropharynx is clear.  Neck:     Musculoskeletal: Normal range of motion.  Cardiovascular:     Rate and Rhythm: Normal rate and regular rhythm.     Pulses: Normal pulses.  Pulmonary:     Effort: Pulmonary effort is normal.     Breath sounds: Normal breath sounds. No wheezing or rales.  Abdominal:     General: There is no distension.     Palpations: Abdomen is soft. There is no mass.       Tenderness: There is abdominal tenderness. There is no guarding.     Comments: Mild tenderness to palpation of the left lower quadrant.  No abdominal distention or peritoneal signs  Genitourinary:    Rectum: Guaiac result positive. No tenderness or external hemorrhoid. Normal anal tone.     Comments: Chaperone present during rectal exam.  Black heme positive stool on digital rectal exam.  No palpable masses. Musculoskeletal: Normal range of motion.     Right lower leg: No edema.     Left lower leg: No edema.  Skin:    General: Skin is warm.     Findings: No rash.  Neurological:     General: No focal deficit present.     Mental Status: He is alert.     Sensory: No sensory deficit.     Motor: No weakness.     Gait: Gait normal.  Psychiatric:        Mood and Affect: Mood normal.      ED Treatments / Results  Labs (all labs ordered are listed, but only abnormal results are displayed) Labs Reviewed  CBC WITH DIFFERENTIAL/PLATELET - Abnormal; Notable for the following components:      Result Value   Hemoglobin 12.0 (*)    HCT 38.7 (*)    All other components within normal limits  COMPREHENSIVE METABOLIC PANEL - Abnormal; Notable for the following components:   Glucose, Bld 100 (*)    Creatinine, Ser 1.29 (*)    GFR calc non Af Amer 57 (*)    All other components within normal limits  URINALYSIS, ROUTINE W REFLEX MICROSCOPIC - Abnormal; Notable for the following components:   APPearance HAZY (*)    Leukocytes, UA TRACE (*)    All other components within normal limits  POC OCCULT BLOOD, ED - Abnormal; Notable for the following components:   Fecal Occult Bld POSITIVE (*)    All  other components within normal limits  LIPASE, BLOOD    EKG None  Radiology    Ct Abdomen Pelvis Wo Contrast  Result Date: 06/23/2018 CLINICAL DATA:  Abdominal pain with nausea EXAM: CT ABDOMEN AND PELVIS WITHOUT CONTRAST TECHNIQUE: Multidetector CT imaging of the abdomen and pelvis was  performed following the standard protocol without IV contrast. COMPARISON:  None. FINDINGS: Lower chest: Mild atelectatic changes are noted in the bases bilaterally. No effusion or focal infiltrate is seen. Hepatobiliary: No focal liver abnormality is seen. No gallstones, gallbladder wall thickening, or biliary dilatation. Pancreas: Unremarkable. No pancreatic ductal dilatation or surrounding inflammatory changes. Spleen: Normal in size without focal abnormality. Adrenals/Urinary Tract: Adrenal glands are within normal limits bilaterally. Kidneys are well visualized bilaterally with nonobstructing stone in the lower pole of the right kidney measuring 6 mm. 3.1 cm hypodense lesion is noted within the right kidney consistent with cyst. No left renal calculi are seen. No obstructive changes are noted. The bladder is decompressed. Stomach/Bowel: Scattered diverticular changes noted throughout the colon. There are changes consistent with diverticulitis in the mid transverse colon and proximal ascending colon without abscess formation or are focal perforation. Stomach is decompressed. No small bowel abnormality is seen. The appendix has been surgically removed. Postsurgical changes in the sigmoid colon are noted. Vascular/Lymphatic: Aortic atherosclerosis. No enlarged abdominal or pelvic lymph nodes. Circumaortic left renal vein is noted. Reproductive: Penile implant is noted with reservoir deep within the pelvis. Prostate has been surgically removed. Other: No ascites is noted. Small omental fat containing supraumbilical hernia is noted. Musculoskeletal: Degenerative changes of lumbar spine are noted. IMPRESSION: Changes consistent with diverticulitis in the mid transverse and proximal ascending colon without evidence of abscess or perforation. Small hernia in the anterior abdominal wall containing omental fat is noted. Nonobstructing right renal stone. Hypodense lesion likely representing right renal cyst.  Electronically Signed   By: Inez Catalina M.D.   On: 06/23/2018 15:00     Procedures Procedures (including critical care time)  Medications Ordered in ED Medications - No data to display   Initial Impression / Assessment and Plan / ED Course  I have reviewed the triage vital signs and the nursing notes.  Pertinent labs & imaging results that were available during my care of the patient were reviewed by me and considered in my medical decision making (see chart for details).    Patient returns to the ER with continued left lower abdominal pain and rectal bleeding with defecation.  CT scan from 5 days ago showed acute diverticulitis without evidence of abscess or perforation.  Patient currently taking Bactrim and Flagyl.  He is hemodynamically stable, no concerning symptoms for sepsis.  Will check labs, IV fluids, and consult GI. No increasing pain, fever or vomiting to suggest indication for repeat CT imaging.   Discussed with Dr. Laverta Baltimore.    [facility]  He recommends switching pt to Cipro and flagyl and treat for 14 days.  Liquids only for 2 days.  Will see pt in office on Thursday as previously scheduled. I have discussed this with the patient and he agrees to plan.  Requesting something else for nausea, as zofran is not effective. Return precautions discussed      Final Clinical Impressions(s) / ED Diagnoses   Final diagnoses:  Diverticulitis of large intestine without perforation or abscess with bleeding    ED Discharge Orders    None       Kem Parkinson, PA-C 06/29/18 0825  Margette Fast, MD 06/29/18 1114

## 2018-06-28 NOTE — ED Triage Notes (Signed)
Pt reports has had bright red blood in stool off and on since last Wednesday.  C/O intermittent pain in llq.  Pt says was here for same.  Reports bleeding stopped after being evaluated Wednesday but started back yesterday after noticing some abd pain.

## 2018-06-28 NOTE — Discharge Instructions (Addendum)
Liquids only diet for 2 days.  Stop the Bactrim and start the Cipro today.  Continue taking your Flagyl as directed.  Keep your appointment with Dr. Dereck Leep for Thursday.

## 2018-06-29 ENCOUNTER — Telehealth: Payer: Self-pay | Admitting: Cardiovascular Disease

## 2018-06-29 NOTE — Telephone Encounter (Signed)
Patient called stating that he is having rectal bleeding for over a week now.  Wants to know what he needs to do about his blood thinner.  [phone].

## 2018-06-29 NOTE — Telephone Encounter (Signed)
Anemia is mild. He needs to continue Plavix through 08/01/18 ideally. Would have him see GI as he may have hemorrhoids.

## 2018-06-29 NOTE — Telephone Encounter (Signed)
Will route to MD for input as antiplatelets outside scope of pharmD and pt less than 1 year out from stent placement. Pt is not on anticoagulation.

## 2018-06-30 NOTE — Telephone Encounter (Signed)
Patient notified and verbalized understanding.  Stated that he is seeing Dr. Laural Golden tomorrow.

## 2018-07-01 ENCOUNTER — Ambulatory Visit (INDEPENDENT_AMBULATORY_CARE_PROVIDER_SITE_OTHER): Payer: Medicare HMO | Admitting: Internal Medicine

## 2018-07-01 ENCOUNTER — Encounter (INDEPENDENT_AMBULATORY_CARE_PROVIDER_SITE_OTHER): Payer: Self-pay | Admitting: Internal Medicine

## 2018-07-01 ENCOUNTER — Telehealth: Payer: Self-pay

## 2018-07-01 VITALS — BP 113/68 | HR 108 | Temp 97.7°F | Ht 67.0 in | Wt 157.3 lb

## 2018-07-01 DIAGNOSIS — K5732 Diverticulitis of large intestine without perforation or abscess without bleeding: Secondary | ICD-10-CM | POA: Diagnosis not present

## 2018-07-01 LAB — CBC WITH DIFFERENTIAL/PLATELET
Absolute Monocytes: 704 cells/uL (ref 200–950)
Basophils Absolute: 32 cells/uL (ref 0–200)
Basophils Relative: 0.4 %
Eosinophils Absolute: 72 cells/uL (ref 15–500)
Eosinophils Relative: 0.9 %
HCT: 34.9 % — ABNORMAL LOW (ref 38.5–50.0)
Hemoglobin: 11.5 g/dL — ABNORMAL LOW (ref 13.2–17.1)
Lymphs Abs: 2808 cells/uL (ref 850–3900)
MCH: 27.1 pg (ref 27.0–33.0)
MCHC: 33 g/dL (ref 32.0–36.0)
MCV: 82.1 fL (ref 80.0–100.0)
MPV: 10.6 fL (ref 7.5–12.5)
Monocytes Relative: 8.8 %
Neutro Abs: 4384 cells/uL (ref 1500–7800)
Neutrophils Relative %: 54.8 %
Platelets: 301 10*3/uL (ref 140–400)
RBC: 4.25 10*6/uL (ref 4.20–5.80)
RDW: 15 % (ref 11.0–15.0)
Total Lymphocyte: 35.1 %
WBC: 8 10*3/uL (ref 3.8–10.8)

## 2018-07-01 NOTE — Telephone Encounter (Signed)
Please inform the patient of the interventional cardiologist's recommendations (who put in stent): "Will continue DAPT with ASA and Plavix for at least one year." I am in agreement with that statement.

## 2018-07-01 NOTE — Patient Instructions (Signed)

## 2018-07-01 NOTE — Progress Notes (Signed)
Subjective:    Patient ID: Ronald Faulkner, male    DOB: Aug 19, 1950, 68 y.o.   MRN: [id number]  HPI Here today after 2 recent visits to the ED for rectal bleeding and diverticulitis. Seen in the ED 06/23/2017 with rectal bleeding. CT scan changes consistent with diverticulitis in the mid transverse and proximal ascending colon without evidence of abscess or perforation. Started on Bactrim and flagyl x 7 days. Seen again in the ED 06/28/2018 for [facility] and left lower quadrant pain. Started and Cipro and Flagyl x 1 week.  He is here for f/u. He states he feels better. Still passing a small amt of blood per rectum. He states he almost fainted yesterday.  His last colonoscopy was in June of 2017 Impression:               - The examined portion of the ileum was normal.                           - A single erosion in the cecum most likely                            secondary to NSAID use.                           - Diverticulosis in the descending colon, in the                            transverse colon and in the ascending colon.                           - External hemorrhoids.                           - No specimens collected.  He feels a little sick. He does feel better.  CBC CBC Latest Ref Rng & Units 06/28/2018 06/23/2018 08/01/2017  WBC 4.0 - 10.5 K/uL 7.6 5.7 9.7  Hemoglobin 13.0 - 17.0 g/dL 12.0(L) 12.2(L) 12.2(L)  Hematocrit 39.0 - 52.0 % 38.7(L) 40.3 37.0(L)  Platelets 150 - 400 K/uL 280 253 223     Review of Systems Past Medical History:  Diagnosis Date  . Arthritis   . Cancer (Ronald Faulkner)   . Colon polyps   . Diabetes mellitus without complication (Ronald Faulkner)   . Diverticulitis   . Hypertension   . IBS (irritable bowel syndrome)   . 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 Unstable angina ([place]) 07/2017    Past Surgical History:  Procedure Laterality Date  . ABDOMINAL SURGERY    . APPENDECTOMY    . BREAST SURGERY     benign lump at lt side  . COLON  SURGERY    . COLONOSCOPY  03/14/2011   Procedure: COLONOSCOPY;  Surgeon: [name], MD;  [facility];  Service: Endoscopy;  Laterality: N/A;  1:00  . COLONOSCOPY N/A 11/15/2015   Procedure: COLONOSCOPY;  Surgeon: [name], MD;  [facility];  Service: Endoscopy;  Laterality: N/A;  11:15  . CORONARY STENT INTERVENTION N/A 07/31/2017   Procedure: CORONARY STENT INTERVENTION;  Surgeon: [name], MD;  Location: [place] CV LAB;  Service: Cardiovascular;  Laterality:  N/A;  . LEFT HEART CATH AND CORONARY ANGIOGRAPHY N/A 07/31/2017   Procedure: LEFT HEART CATH AND CORONARY ANGIOGRAPHY;  Surgeon: [name], MD;  Location: [place] CV LAB;  Service: Cardiovascular;  Laterality: N/A;  . PENILE PROSTHESIS IMPLANT     2016   . prostate cancer     2015. prostectomy    Allergies  Allergen Reactions  . Bee Venom   . Fish Allergy Hives and Swelling  . Ivp Dye [Iodinated Diagnostic Agents] Swelling    Current Outpatient Medications on File Prior to Visit  Medication Sig Dispense Refill  . allopurinol (ZYLOPRIM) 300 MG tablet Take 300 mg by mouth 2 (two) times daily.     Marland Kitchen aspirin EC 81 MG tablet Take 81 mg by mouth daily.    . ciprofloxacin (CIPRO) 500 MG tablet Take 1 tablet (500 mg total) by mouth 2 (two) times daily. 28 tablet 0  . clopidogrel (PLAVIX) 75 MG tablet Take 1 tablet (75 mg total) by mouth daily with breakfast. 30 tablet 11  . ezetimibe (ZETIA) 10 MG tablet Take 1 tablet (10 mg total) by mouth daily. 90 tablet 3  . hydrochlorothiazide (HYDRODIURIL) 25 MG tablet Take 1 tablet by mouth daily.    . isosorbide mononitrate (IMDUR) 30 MG 24 hr tablet Take 1 tablet (30 mg total) by mouth daily. 30 tablet 6  . linaclotide (LINZESS) 145 MCG CAPS capsule Take 145 mcg by mouth daily as needed (for constipation). As needed    . losartan (COZAAR) 100 MG tablet Take 100 mg by mouth daily.    . metFORMIN (GLUCOPHAGE) 500 MG tablet Take  1 tablet (500 mg total) by mouth 2 (two) times daily with a meal.    . metoprolol succinate (TOPROL XL) 25 MG 24 hr tablet Take 1.5 tablets (37.5 mg total) by mouth daily. 135 tablet 3  . metroNIDAZOLE (FLAGYL) 500 MG tablet Take 1 tablet (500 mg total) by mouth 3 (three) times daily. 21 tablet 0  . nitroGLYCERIN (NITROSTAT) 0.4 MG SL tablet Place 1 tablet (0.4 mg total) under the tongue every 5 (five) minutes as needed for chest pain. 25 tablet 4  . oxymetazoline (12 HOUR NASAL SPRAY) 0.05 % nasal spray Place 1 spray into both nostrils as needed.     . promethazine (PHENERGAN) 25 MG tablet Take 1 tablet (25 mg total) by mouth every 6 (six) hours as needed for nausea or vomiting. 10 tablet 0  . rosuvastatin (CRESTOR) 5 MG tablet Take 5 mg by mouth daily. On Monday,wednesday, Friday only     No current facility-administered medications on file prior to visit.         Objective:   Physical Exam Blood pressure 113/68, pulse (!) 108, temperature 97.7 F (36.5 C), height 5\' 7"  (1.702 m), weight 157 lb 4.8 oz (71.4 kg). Alert and oriented. Skin warm and dry. Oral mucosa is moist.   . Sclera anicteric, conjunctivae is pink. Thyroid not enlarged. No cervical lymphadenopathy. Lungs clear. Heart regular rate and rhythm.  Abdomen is soft. Bowel sounds are positive. No hepatomegaly. No abdominal masses felt. Slight tenderness.across lower abdomen.    No edema to lower extremities.           Assessment & Plan:  Diverticulitis. Am going to get a CBC. Further recommendations to follow. Continue the antibiotics.

## 2018-07-01 NOTE — Telephone Encounter (Signed)
I left a message on his home machine giving him Dr.Koneswarans advice to remain on Plavix for now

## 2018-07-01 NOTE — Telephone Encounter (Signed)
Patient walked in to say he just saw GI, Delsa Grana for rectal bleeding.CT showed diverticulitis. Placed on Cipro and Flagyl. Had two stents in 07/2017 and is on plavix. He feels he should stop plavix, I encouraged him not to and to him I would message you.

## 2018-07-22 ENCOUNTER — Encounter: Payer: Self-pay | Admitting: Student

## 2018-07-22 ENCOUNTER — Encounter

## 2018-07-22 ENCOUNTER — Ambulatory Visit (INDEPENDENT_AMBULATORY_CARE_PROVIDER_SITE_OTHER): Payer: Medicare HMO | Admitting: Student

## 2018-07-22 VITALS — BP 122/72 | HR 80 | Ht 67.0 in | Wt 164.0 lb

## 2018-07-22 DIAGNOSIS — E785 Hyperlipidemia, unspecified: Secondary | ICD-10-CM

## 2018-07-22 DIAGNOSIS — R072 Precordial pain: Secondary | ICD-10-CM | POA: Diagnosis not present

## 2018-07-22 DIAGNOSIS — I10 Essential (primary) hypertension: Secondary | ICD-10-CM

## 2018-07-22 DIAGNOSIS — I25119 Atherosclerotic heart disease of native coronary artery with unspecified angina pectoris: Secondary | ICD-10-CM | POA: Diagnosis not present

## 2018-07-22 MED ORDER — ISOSORBIDE MONONITRATE ER 60 MG PO TB24: 60.0000 mg | ORAL_TABLET | Freq: Every day | ORAL | 3 refills | Status: DC

## 2018-07-22 NOTE — Patient Instructions (Signed)
Medication Instructions:  Your physician recommends that you continue on your current medications as directed. Please refer to the Current Medication list given to you today.  Increase Imdur to 60 mg Daily   If you need a refill on your cardiac medications before your next appointment, please call your pharmacy.   Lab work: NONE   If you have labs (blood work) drawn today and your tests are completely normal, you will receive your results only by: Marland Kitchen MyChart Message (if you have MyChart) OR . A paper copy in the mail If you have any lab test that is abnormal or we need to change your treatment, we will call you to review the results.  Testing/Procedures: Your physician has requested that you have an echocardiogram. Echocardiography is a painless test that uses sound waves to create images of your heart. It provides your doctor with information about the size and shape of your heart and how well your heart's chambers and valves are working. This procedure takes approximately one hour. There are no restrictions for this procedure.   Follow-Up: At [facility], you and your health needs are our priority.  As part of our continuing mission to provide you with exceptional heart care, we have created designated Provider Care Teams.  These Care Teams include your primary Cardiologist (physician) and Advanced Practice Providers (APPs -  Physician Assistants and Nurse Practitioners) who all work together to provide you with the care you need, when you need it. You will need a follow up appointment in 2-3  months.  Please call our office 2 months in advance to schedule this appointment.  You may see Kate Sable, MD or one of the following Advanced Practice Providers on your designated Care Team:   Bernerd Pho, PA-C [facility]) . Ermalinda Barrios, PA-C ([place])  Any Other Special Instructions Will Be Listed Below (If Applicable). Thank you for choosing [facility]!

## 2018-07-22 NOTE — Progress Notes (Signed)
Cardiology Office Note    Date:  07/22/2018   ID:  Alf, Doyle 06/13/1950, MRN [id number]  PCP:  [name], DO  Cardiologist: Kate Sable, MD    Chief Complaint  Patient presents with  . Follow-up    1 month visit    History of Present Illness:    Ronald Faulkner is a 68 y.o. male with past medical history of CAD (s/p DES to mid and distal LAD in 07/2017 with medical management recommended of intermediate branch), HTN, HLD, and IBS who presents to the office today for 70-month follow-up.   He was last examined by Dr. Bronson Ing on 06/04/2018 and reported precordial chest discomfort occurring at rest and with activity which would last for several seconds then resolve. It was thought his angina might be secondary to the known stenosis along the small caliber intermediate branch and he was started on Imdur 30mg  daily along with continuing ASA, Plavix, BB, and statin therapy (taking Crestor 5mg  three times weekly).   In talking with the patient today, he reports still having frequent episodes of chest pain. Says this has actually been occurring since his cardiac catheterization in 07/2017. He describes two different types of pain, one being a constant discomfort which lasts for 12+ hours. The other type of pain he experiences is a shooting pain which only lasts for a few seconds and then spontaneously resolves. Actually reported having an episode of this during his examination today. There is no association of his pain with exertion or positional changes.   No recent orthopnea, PND, lower extremity edema, or palpitations.  Reports good compliance with his current medication regimen but is unsure if he started Imdur following his last office visit.   Past Medical History:  Diagnosis Date  . Arthritis   . CAD (coronary artery disease)    a. s/p DES to mid and distal LAD in 07/2017 with medical management recommended of intermediate branch  . Cancer ([place])   . Colon polyps    . Diabetes mellitus without complication ([place])   . Diverticulitis   . Hypertension   . IBS (irritable bowel syndrome)   . 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 Unstable angina ([place]) 07/2017    Past Surgical History:  Procedure Laterality Date  . ABDOMINAL SURGERY    . APPENDECTOMY    . BREAST SURGERY     benign lump at lt side  . COLON SURGERY    . COLONOSCOPY  03/14/2011   Procedure: COLONOSCOPY;  Surgeon: [name], MD;  [facility];  Service: Endoscopy;  Laterality: N/A;  1:00  . COLONOSCOPY N/A 11/15/2015   Procedure: COLONOSCOPY;  Surgeon: [name], MD;  Location: [facility];  Service: Endoscopy;  Laterality: N/A;  11:15  . CORONARY STENT INTERVENTION N/A 07/31/2017   Procedure: CORONARY STENT INTERVENTION;  Surgeon: [name], MD;  Location: [facility];  Service: Cardiovascular;  Laterality: N/A;  . LEFT HEART CATH AND CORONARY ANGIOGRAPHY N/A 07/31/2017   Procedure: LEFT HEART CATH AND CORONARY ANGIOGRAPHY;  Surgeon: [name], MD;  Location: [facility];  Service: Cardiovascular;  Laterality: N/A;  . PENILE PROSTHESIS IMPLANT     2016   . prostate cancer     2015. prostectomy    Current Medications: Outpatient Medications Prior to Visit  Medication Sig Dispense Refill  . allopurinol (ZYLOPRIM) 300 MG tablet Take 300 mg by mouth  2 (two) times daily.     Marland Kitchen aspirin EC 81 MG tablet Take 81 mg by mouth daily.    . clopidogrel (PLAVIX) 75 MG tablet Take 1 tablet (75 mg total) by mouth daily with breakfast. 30 tablet 11  . ezetimibe (ZETIA) 10 MG tablet Take 1 tablet (10 mg total) by mouth daily. 90 tablet 3  . hydrochlorothiazide (HYDRODIURIL) 25 MG tablet Take 1 tablet by mouth daily.    Marland Kitchen linaclotide (LINZESS) 145 MCG CAPS capsule Take 145 mcg by mouth daily as needed (for constipation). As needed    . losartan (COZAAR) 100 MG tablet Take 100 mg by mouth daily.    .  metFORMIN (GLUCOPHAGE) 500 MG tablet Take 1 tablet (500 mg total) by mouth 2 (two) times daily with a meal.    . metoprolol succinate (TOPROL XL) 25 MG 24 hr tablet Take 1.5 tablets (37.5 mg total) by mouth daily. 135 tablet 3  . nitroGLYCERIN (NITROSTAT) 0.4 MG SL tablet Place 1 tablet (0.4 mg total) under the tongue every 5 (five) minutes as needed for chest pain. 25 tablet 4  . oxymetazoline (12 HOUR NASAL SPRAY) 0.05 % nasal spray Place 1 spray into both nostrils as needed.     . promethazine (PHENERGAN) 25 MG tablet Take 1 tablet (25 mg total) by mouth every 6 (six) hours as needed for nausea or vomiting. 10 tablet 0  . rosuvastatin (CRESTOR) 5 MG tablet Take 5 mg by mouth daily. On Monday,wednesday, Friday only    . ciprofloxacin (CIPRO) 500 MG tablet Take 1 tablet (500 mg total) by mouth 2 (two) times daily. 28 tablet 0  . isosorbide mononitrate (IMDUR) 30 MG 24 hr tablet Take 1 tablet (30 mg total) by mouth daily. 30 tablet 6  . metroNIDAZOLE (FLAGYL) 500 MG tablet Take 1 tablet (500 mg total) by mouth 3 (three) times daily. 21 tablet 0   No facility-administered medications prior to visit.      Allergies:   Bee venom; Fish allergy; and Ivp dye [iodinated diagnostic agents]   Social History   Socioeconomic History  . Marital status: Married    Spouse name: Not on file  . Number of children: 2  . Years of education: Not on file  . Highest education level: Not on file  Occupational History    Employer: [employer]  Social Needs  . Financial resource strain: Not on file  . Food insecurity:    Worry: Not on file    Inability: Not on file  . Transportation needs:    Medical: Not on file    Non-medical: Not on file  Tobacco Use  . Smoking status: Former Smoker    Packs/day: 1.00    Years: 35.00    Pack years: 35.00    Types: Cigarettes    Last attempt to quit: 06/03/1999    Years since quitting: 19.1  . Smokeless tobacco: Never Used  . Tobacco comment: quit 16 years  ago  Substance and Sexual Activity  . Alcohol use: Yes    Comment: occ  . Drug use: No    Comment: hx of use in his colege days  . Sexual activity: Not on file  Lifestyle  . Physical activity:    Days per week: Not on file    Minutes per session: Not on file  . Stress: Not on file  Relationships  . Social connections:    Talks on phone: Not on file    Gets together:  Not on file    Attends religious service: Not on file    Active member of club or organization: Not on file    Attends meetings of clubs or organizations: Not on file    Relationship status: Not on file  Other Topics Concern  . Not on file  Social History Narrative   Lives at home with wife and daughter and her three children.       Family History:  The patient's family history includes Colon cancer in his mother; Coronary artery disease in his maternal grandmother.   Review of Systems:   Please see the history of present illness.     General:  No chills, fever, night sweats or weight changes.  Cardiovascular:  No dyspnea on exertion, edema, orthopnea, palpitations, paroxysmal nocturnal dyspnea. Positive for chest pain.  Dermatological: No rash, lesions/masses Respiratory: No cough, dyspnea Urologic: No hematuria, dysuria Abdominal:   No nausea, vomiting, diarrhea, bright red blood per rectum, melena, or hematemesis Neurologic:  No visual changes, wkns, changes in mental status. All other systems reviewed and are otherwise negative except as noted above.   Physical Exam:    VS:  BP 122/72 (BP Location: Left Arm)   Pulse 80   Ht 5\' 7"  (1.702 m)   Wt 164 lb (74.4 kg)   BMI 25.69 kg/m    General: Well developed, well nourished [place] American male appearing in no acute distress. Head: Normocephalic, atraumatic, sclera non-icteric, no xanthomas, nares are without discharge.  Neck: No carotid bruits. JVD not elevated.  Lungs: Respirations regular and unlabored, without wheezes or rales.  Heart: Regular  rate and rhythm. No S3 or S4.  No murmur, no rubs, or gallops appreciated. Abdomen: Soft, non-tender, non-distended with normoactive bowel sounds. No hepatomegaly. No rebound/guarding. No obvious abdominal masses. Msk:  Strength and tone appear normal for age. No joint deformities or effusions. Extremities: No clubbing or cyanosis. No lower extremity edema.  Distal pedal pulses are 2+ bilaterally. Neuro: Alert and oriented X 3. Moves all extremities spontaneously. No focal deficits noted. Psych:  Responds to questions appropriately with a normal affect. Skin: No rashes or lesions noted  Wt Readings from Last 3 Encounters:  07/22/18 164 lb (74.4 kg)  07/01/18 157 lb 4.8 oz (71.4 kg)  06/28/18 165 lb (74.8 kg)     Studies/Labs Reviewed:   EKG:  EKG is ordered today.  The ekg ordered today demonstrates NSR, HR 85, with RBBB. No acute changes when compared to prior tracings.   Recent Labs: 08/01/2017: Magnesium 1.9 06/28/2018: ALT 34; BUN 21; Creatinine, Ser 1.29; Potassium 4.0; Sodium 137 07/01/2018: Hemoglobin 11.5; Platelets 301   Lipid Panel    Component Value Date/Time   CHOL 195 05/27/2017 0844   TRIG 253 (H) 05/27/2017 0844   HDL 45 05/27/2017 0844   CHOLHDL 4.3 05/27/2017 0844   VLDL 51 (H) 05/27/2017 0844   LDLCALC 99 05/27/2017 0844    Additional studies/ records that were reviewed today include:   Echocardiogram: 07/2017 Study Conclusions  - Left ventricle: The cavity size was normal. Systolic function was   at the lower limits of normal. The estimated ejection fraction   was in the range of 50% to 55%. Wall motion was normal; there   were no regional wall motion abnormalities. Doppler parameters   are consistent with abnormal left ventricular relaxation (grade 1   diastolic dysfunction).  Cardiac Catheterization: 07/2017  Prox RCA to Mid RCA lesion is 20% stenosed.  Dist RCA lesion  is 50% stenosed.  Dist Cx lesion is 60% stenosed.  Ost Cx to Prox Cx lesion  is 20% stenosed.  Ost 1st Mrg to 1st Mrg lesion is 70% stenosed.  Ost LAD to Prox LAD lesion is 20% stenosed.  Mid LAD-1 lesion is 80% stenosed.  Mid LAD-2 lesion is 95% stenosed.  Dist LAD lesion is 70% stenosed.  A drug-eluting stent was successfully placed using a STENT SIERRA 2.25 X 12 MM.  Post intervention, there is a 0% residual stenosis.  A drug-eluting stent was successfully placed using a STENT SIERRA 2.50 X 18 MM.  Post intervention, there is a 0% residual stenosis.   1. Severe stenosis in the mid and distal LAD. Successful PTCA/DES x 1 distal LAD and PTCA/DES x 1 mid LAD.  2. Modeately severe stenosis in small caliber intermediate branch.  3. Moderate stenosis in the distal RCA  Recommendations: Will continue DAPT with ASA and Plavix for at least one year. Will continue beta blocker and statin. I would recommend medical management of the disease in the small caliber intermediate branch.    Assessment:    1. Coronary artery disease involving native coronary artery of native heart with angina pectoris ([place])   2. Precordial pain   3. Essential hypertension   4. Hyperlipidemia LDL goal <70      Plan:   In order of problems listed above:  1. CAD/ Precordial Chest Pain - the patient is s/p DES to mid and distal LAD in 07/2017 with medical management recommended of intermediate branch as outlined above. He tells me today he has actually been having pain since his catheterization 11 months ago and experiences pain which can last for hours at a time and then a different type of shooting pain which lasts for seconds. Neither associated with exertion. - his pain overall seems atypical for angina, but his symptoms in 07/2017 were atypical as well. EKG today shows no acute ischemic changes. He is unsure if he started Imdur and I encouraged him to verify this upon returning home. If he did start, will plan to titrate to 60mg  daily. His EF was mildly reduced at 50-55% in  07/2017 and will recheck for reassessment for if this has further declined, may need to consider repeat ischemic testing.  - continue ASA, Plavix, BB, and statin therapy.   2. HTN - BP is well-controlled at 122/72 during today's visit. - continue HCTZ 25mg  daily, Losartan 100mg  daily, and Toprol-XL 37.5mg  daily. Will plan to titrate Imdur from 30mg  daily to 60mg  daily as outlined above.   3. HLD - goal LDL is < 70 with known CAD. He was intolerant to high-intensity statin therapy. Currently on Zetia 10mg  daily and Crestor 5mg  three times weekly. Due for repeat FLP - not fasting today so will need to return for fasting labs.    Medication Adjustments/Labs and Tests Ordered: Current medicines are reviewed at length with the patient today.  Concerns regarding medicines are outlined above.  Medication changes, Labs and Tests ordered today are listed in the Patient Instructions below. Patient Instructions  Medication Instructions:  Your physician recommends that you continue on your current medications as directed. Please refer to the Current Medication list given to you today.  Increase Imdur to 60 mg Daily   If you need a refill on your cardiac medications before your next appointment, please call your pharmacy.   Lab work: NONE   If you have labs (blood work) drawn today and your tests are  completely normal, you will receive your results only by: Marland Kitchen MyChart Message (if you have MyChart) OR . A paper copy in the mail If you have any lab test that is abnormal or we need to change your treatment, we will call you to review the results.  Testing/Procedures: Your physician has requested that you have an echocardiogram. Echocardiography is a painless test that uses sound waves to create images of your heart. It provides your doctor with information about the size and shape of your heart and how well your heart's chambers and valves are working. This procedure takes approximately one hour. There  are no restrictions for this procedure.   Follow-Up: At [facility], you and your health needs are our priority.  As part of our continuing mission to provide you with exceptional heart care, we have created designated Provider Care Teams.  These Care Teams include your primary Cardiologist (physician) and Advanced Practice Providers (APPs -  Physician Assistants and Nurse Practitioners) who all work together to provide you with the care you need, when you need it. You will need a follow up appointment in 2-3  months.  Please call our office 2 months in advance to schedule this appointment.  You may see Kate Sable, MD or one of the following Advanced Practice Providers on your designated Care Team:   Bernerd Pho, PA-C [facility]) . Ermalinda Barrios, PA-C ([place])  Any Other Special Instructions Will Be Listed Below (If Applicable). Thank you for choosing [facility]!     Signed, Erma Heritage, PA-C  07/22/2018 7:15 PM    [facility] [address] [place], [place] [address] Phone: [phone] Fax: [phone]

## 2018-08-12 ENCOUNTER — Other Ambulatory Visit: Payer: Medicare HMO

## 2018-08-18 ENCOUNTER — Encounter: Payer: Self-pay | Admitting: *Deleted

## 2018-08-18 ENCOUNTER — Other Ambulatory Visit: Payer: Self-pay | Admitting: *Deleted

## 2018-08-18 DIAGNOSIS — E785 Hyperlipidemia, unspecified: Secondary | ICD-10-CM

## 2018-09-01 ENCOUNTER — Other Ambulatory Visit: Payer: Medicare HMO

## 2018-09-02 ENCOUNTER — Telehealth: Payer: Self-pay | Admitting: Cardiovascular Disease

## 2018-09-02 NOTE — Telephone Encounter (Signed)
Patient is calling to ask if he should stay out of work due to coronavirus due to his heart condition. / tg

## 2018-09-23 ENCOUNTER — Encounter: Payer: Self-pay | Admitting: *Deleted

## 2018-09-27 ENCOUNTER — Other Ambulatory Visit: Payer: Self-pay | Admitting: Adult Health

## 2018-09-27 NOTE — Telephone Encounter (Signed)
Clopidogrel refilled

## 2018-09-30 ENCOUNTER — Telehealth: Payer: Self-pay | Admitting: *Deleted

## 2018-09-30 NOTE — Telephone Encounter (Signed)
Patient verbally consented for telehealth visits with [facility] and understands that his insurance company will be billed for the encounter.   Aware to have vitals available 

## 2018-10-04 ENCOUNTER — Telehealth (INDEPENDENT_AMBULATORY_CARE_PROVIDER_SITE_OTHER): Payer: Medicare HMO | Admitting: Cardiovascular Disease

## 2018-10-04 ENCOUNTER — Encounter: Payer: Self-pay | Admitting: Cardiovascular Disease

## 2018-10-04 VITALS — BP 119/77 | HR 78 | Ht 67.0 in | Wt 158.0 lb

## 2018-10-04 DIAGNOSIS — R072 Precordial pain: Secondary | ICD-10-CM

## 2018-10-04 DIAGNOSIS — I25119 Atherosclerotic heart disease of native coronary artery with unspecified angina pectoris: Secondary | ICD-10-CM

## 2018-10-04 DIAGNOSIS — I10 Essential (primary) hypertension: Secondary | ICD-10-CM

## 2018-10-04 DIAGNOSIS — E785 Hyperlipidemia, unspecified: Secondary | ICD-10-CM

## 2018-10-04 NOTE — Progress Notes (Addendum)
Virtual Visit via Video Note   This visit type was conducted due to national recommendations for restrictions regarding the COVID-19 Pandemic (e.g. social distancing) in an effort to limit this patient's exposure and mitigate transmission in our community.  Due to his co-morbid illnesses, this patient is at least at moderate risk for complications without adequate follow up.  This format is felt to be most appropriate for this patient at this time.  All issues noted in this document were discussed and addressed.  A limited physical exam was performed with this format.  Please refer to the patient's chart for his consent to telehealth for [facility].   Date:  10/04/2018   ID:  Ronald Faulkner, DOB [birth year], MRN [id number]  Patient Location: Home Provider Location: Home  PCP:  [name], DO  Cardiologist:  Kate Sable, MD  Electrophysiologist:  None   Evaluation Performed:  Follow-Up Visit  Chief Complaint:  CAD  History of Present Illness:    Ronald Faulkner is a 68 y.o. male with CAD.   In summary, he has a history of coronary artery disease and underwent drug-eluting stent placement to the mid and distal LAD on 07/31/2017. He had moderately severe stenosis in a small caliber intermediate branch and medical management was recommended. There was moderate stenosis in the distal RCA, 50%.  Additional past medical history includes hypertension and type 2 diabetes mellitus.  I added Imdur 30 mg on 06/04/18.   He saw B. Strader PA-C in February and an echo was ordered but it has not been completed. Imdur was increased to 60 mg.  He has only been taking 30 mg of Imdur.  He has fleeting chest pains lasting seconds about twice per week. He says "it is about the same" since his last office visit.  The patient does not have symptoms concerning for COVID-19 infection (fever, chills, cough, or new shortness of breath).    Past Medical History:  Diagnosis Date  . Arthritis    . CAD (coronary artery disease)    a. s/p DES to mid and distal LAD in 07/2017 with medical management recommended of intermediate branch  . Cancer ([place])   . Colon polyps   . Diabetes mellitus without complication ([place])   . Diverticulitis   . Hypertension   . IBS (irritable bowel syndrome)   . 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 Unstable angina ([place]) 07/2017   Past Surgical History:  Procedure Laterality Date  . ABDOMINAL SURGERY    . APPENDECTOMY    . BREAST SURGERY     benign lump at lt side  . COLON SURGERY    . COLONOSCOPY  03/14/2011   Procedure: COLONOSCOPY;  Surgeon: [name], MD;  [facility];  Service: Endoscopy;  Laterality: N/A;  1:00  . COLONOSCOPY N/A 11/15/2015   Procedure: COLONOSCOPY;  Surgeon: [name], MD;  [facility];  Service: Endoscopy;  Laterality: N/A;  11:15  . CORONARY STENT INTERVENTION N/A 07/31/2017   Procedure: CORONARY STENT INTERVENTION;  Surgeon: [name], MD;  Location: [place] [facility];  Service: Cardiovascular;  Laterality: N/A;  . LEFT HEART CATH AND CORONARY ANGIOGRAPHY N/A 07/31/2017   Procedure: LEFT HEART CATH AND CORONARY ANGIOGRAPHY;  Surgeon: [name], MD;  Location: [place] CV LAB;  Service: Cardiovascular;  Laterality: N/A;  . PENILE PROSTHESIS IMPLANT     2016   . prostate cancer  2015. prostectomy     Current Meds  Medication Sig  . allopurinol (ZYLOPRIM) 300 MG tablet Take 300 mg by mouth 2 (two) times daily.   Marland Kitchen aspirin EC 81 MG tablet Take 81 mg by mouth daily.  . clopidogrel (PLAVIX) 75 MG tablet Take 1 tablet by mouth once daily with breakfast  . ezetimibe (ZETIA) 10 MG tablet Take 1 tablet (10 mg total) by mouth daily.  . hydrochlorothiazide (HYDRODIURIL) 25 MG tablet Take 1 tablet by mouth daily.  . isosorbide mononitrate (IMDUR) 60 MG 24 hr tablet Take 1 tablet (60 mg total) by mouth daily.  Marland Kitchen linaclotide  (LINZESS) 145 MCG CAPS capsule Take 145 mcg by mouth daily as needed (for constipation). As needed  . losartan (COZAAR) 100 MG tablet Take 100 mg by mouth daily.  . metFORMIN (GLUCOPHAGE) 500 MG tablet Take 1 tablet (500 mg total) by mouth 2 (two) times daily with a meal.  . metoprolol succinate (TOPROL XL) 25 MG 24 hr tablet Take 1.5 tablets (37.5 mg total) by mouth daily.  . nitroGLYCERIN (NITROSTAT) 0.4 MG SL tablet Place 1 tablet (0.4 mg total) under the tongue every 5 (five) minutes as needed for chest pain.  Marland Kitchen oxymetazoline (12 HOUR NASAL SPRAY) 0.05 % nasal spray Place 1 spray into both nostrils as needed.   . promethazine (PHENERGAN) 25 MG tablet Take 1 tablet (25 mg total) by mouth every 6 (six) hours as needed for nausea or vomiting.  . rosuvastatin (CRESTOR) 5 MG tablet Take 5 mg by mouth daily. On Monday,wednesday, Friday only     Allergies:   Bee venom; Fish allergy; and Ivp dye [iodinated diagnostic agents]   Social History   Tobacco Use  . Smoking status: Former Smoker    Packs/day: 1.00    Years: 35.00    Pack years: 35.00    Types: Cigarettes    Last attempt to quit: 06/03/1999    Years since quitting: 19.3  . Smokeless tobacco: Never Used  . Tobacco comment: quit 16 years ago  Substance Use Topics  . Alcohol use: Yes    Comment: occ  . Drug use: No    Comment: hx of use in his colege days     Family Hx: The patient's family history includes Colon cancer in his mother; Coronary artery disease in his maternal grandmother. There is no history of Anesthesia problems, Hypotension, Malignant hyperthermia, or Pseudochol deficiency.  ROS:   Please see the history of present illness.     All other systems reviewed and are negative.   Prior CV studies:   The following studies were reviewed today:  Echocardiogram: 07/2017 Study Conclusions  - Left ventricle: The cavity size was normal. Systolic function was at the lower limits of normal. The estimated ejection  fraction was in the range of 50% to 55%. Wall motion was normal; there were no regional wall motion abnormalities. Doppler parameters are consistent with abnormal left ventricular relaxation (grade 1 diastolic dysfunction).  Cardiac Catheterization: 07/2017  Prox RCA to Mid RCA lesion is 20% stenosed.  Dist RCA lesion is 50% stenosed.  Dist Cx lesion is 60% stenosed.  Ost Cx to Prox Cx lesion is 20% stenosed.  Ost 1st Mrg to 1st Mrg lesion is 70% stenosed.  Ost LAD to Prox LAD lesion is 20% stenosed.  Mid LAD-1 lesion is 80% stenosed.  Mid LAD-2 lesion is 95% stenosed.  Dist LAD lesion is 70% stenosed.  A drug-eluting stent was successfully placed using a  STENT SIERRA 2.25 X 12 MM.  Post intervention, there is a 0% residual stenosis.  A drug-eluting stent was successfully placed using a STENT SIERRA 2.50 X 18 MM.  Post intervention, there is a 0% residual stenosis.  1. Severe stenosis in the mid and distal LAD. Successful PTCA/DES x 1 distal LAD and PTCA/DES x 1 mid LAD.  2. Modeately severe stenosis in small caliber intermediate branch.  3. Moderate stenosis in the distal RCA  Recommendations: Will continue DAPT with ASA and Plavix for at least one year. Will continue beta blocker and statin. I would recommend medical management of the disease in the small caliber intermediate branch.   Labs/Other Tests and Data Reviewed:    EKG:  No ECG reviewed.  Recent Labs: 06/28/2018: ALT 34; BUN 21; Creatinine, Ser 1.29; Potassium 4.0; Sodium 137 07/01/2018: Hemoglobin 11.5; Platelets 301   Recent Lipid Panel Lab Results  Component Value Date/Time   CHOL 195 05/27/2017 08:44 AM   TRIG 253 (H) 05/27/2017 08:44 AM   HDL 45 05/27/2017 08:44 AM   CHOLHDL 4.3 05/27/2017 08:44 AM   LDLCALC 99 05/27/2017 08:44 AM    Wt Readings from Last 3 Encounters:  10/04/18 158 lb (71.7 kg)  07/22/18 164 lb (74.4 kg)  07/01/18 157 lb 4.8 oz (71.4 kg)     Objective:     Vital Signs:  BP 119/77   Pulse 78   Ht 5\' 7"  (1.702 m)   Wt 158 lb (71.7 kg)   BMI 24.75 kg/m    VITAL SIGNS:  reviewed GEN:  no acute distress EYES:  sclerae anicteric, EOMI - Extraocular Movements Intact RESPIRATORY:  normal respiratory effort, symmetric expansion MUSCULOSKELETAL:  no obvious deformities. NEURO:  alert and oriented x 3, no obvious focal deficit PSYCH:  normal affect  ASSESSMENT & PLAN:    1. CAD/chest pain: Coronary angiography details reviewed above with mid and distal LAD stent placement in March 2019. Continue ASA,  Imdur, and statin. I told him to stop Plavix. He did not tolerate atorvastatin 40 mg daily and is taking Crestor 3 days a week along with Zetia.  He continues to have episodic chest pain and did not take the increased dose of Imdur. I encouraged him to take 60 mg daily.  2. HTN: BP is normal. This will need continued monitoring.  3. HLD: LDL 132 on 03/24/2018. He did not tolerate atorvastatin 40 mg daily.   He takes Crestor 5 mg every Monday, Wednesday, and Friday.  He is also on Zetia.  He does have some myalgias but they are not as severe as when he took Lipitor.  I will repeat lipids.  He may need PCSK9 inhibitors.   COVID-19 Education: The signs and symptoms of COVID-19 were discussed with the patient and how to seek care for testing (follow up with PCP or arrange E-visit).  The importance of social distancing was discussed today.  Time:   Today, I have spent 25 minutes with the patient with telehealth technology discussing the above problems.     Medication Adjustments/Labs and Tests Ordered: Current medicines are reviewed at length with the patient today.  Concerns regarding medicines are outlined above.   Tests Ordered: No orders of the defined types were placed in this encounter.   Medication Changes: No orders of the defined types were placed in this encounter.   Disposition:  Follow up in 6 month(s)  Signed, Kate Sable, MD  10/04/2018 2:04 PM    [facility]

## 2018-10-04 NOTE — Patient Instructions (Addendum)
Medication Instructions:   Please take the 60mg  Imdur as instructed previously.  60mg  tabs were sent to [facility] on 07/22/2018.    Stop Plavix (Clopidogrel).  Continue all other medications.    Labwork:  Lipids - can be done within the next 2 months (when convenient for you).    You may take the enclosed order to [facility] or the lab [facility]) across from the hospital.    Reminder:  Nothing to eat or drink after 12 midnight prior to labs.  Office will contact with results via phone or letter.    Testing/Procedures: none  Follow-Up: 6 months   Any Other Special Instructions Will Be Listed Below (If Applicable).  If you need a refill on your cardiac medications before your next appointment, please call your pharmacy.

## 2018-10-04 NOTE — Addendum Note (Signed)
Addended by: Laurine Blazer on: 10/04/2018 02:57 PM   Modules accepted: Orders

## 2018-10-07 ENCOUNTER — Other Ambulatory Visit: Payer: Medicare HMO

## 2018-10-13 ENCOUNTER — Ambulatory Visit (INDEPENDENT_AMBULATORY_CARE_PROVIDER_SITE_OTHER): Payer: Medicare HMO

## 2018-10-13 ENCOUNTER — Other Ambulatory Visit: Payer: Self-pay

## 2018-10-13 DIAGNOSIS — I25119 Atherosclerotic heart disease of native coronary artery with unspecified angina pectoris: Secondary | ICD-10-CM

## 2018-10-13 DIAGNOSIS — R072 Precordial pain: Secondary | ICD-10-CM | POA: Diagnosis not present

## 2018-10-14 ENCOUNTER — Telehealth: Payer: Self-pay | Admitting: *Deleted

## 2018-10-14 NOTE — Telephone Encounter (Signed)
Called patient with test results. No answer. Left message to call back.  

## 2018-10-14 NOTE — Telephone Encounter (Signed)
-----   Message from Erma Heritage, [place] sent at 10/14/2018  8:35 AM EDT ----- Please let the patient know the pumping function of his heart has now improved into a normal range with EF at 60-65%. No significant valve abnormalities. Please forward a copy of results to [name], DO.

## 2018-10-14 NOTE — Telephone Encounter (Signed)
Returning call.

## 2018-10-14 NOTE — Telephone Encounter (Signed)
Pt notified of test results and voiced understanding.

## 2018-11-17 ENCOUNTER — Other Ambulatory Visit: Payer: Self-pay | Admitting: Cardiovascular Disease

## 2018-11-21 ENCOUNTER — Other Ambulatory Visit: Payer: Self-pay | Admitting: Adult Health

## 2018-11-24 ENCOUNTER — Other Ambulatory Visit: Payer: Self-pay | Admitting: Adult Health

## 2019-01-30 ENCOUNTER — Other Ambulatory Visit: Payer: Self-pay | Admitting: Cardiovascular Disease

## 2019-02-14 ENCOUNTER — Other Ambulatory Visit: Payer: Self-pay | Admitting: Cardiovascular Disease

## 2019-06-09 ENCOUNTER — Encounter: Payer: Self-pay | Admitting: Cardiovascular Disease

## 2019-06-09 ENCOUNTER — Telehealth (INDEPENDENT_AMBULATORY_CARE_PROVIDER_SITE_OTHER): Payer: Medicare HMO | Admitting: Cardiovascular Disease

## 2019-06-09 VITALS — BP 135/85 | HR 89 | Ht 67.0 in | Wt 165.0 lb

## 2019-06-09 DIAGNOSIS — Z955 Presence of coronary angioplasty implant and graft: Secondary | ICD-10-CM

## 2019-06-09 DIAGNOSIS — I10 Essential (primary) hypertension: Secondary | ICD-10-CM

## 2019-06-09 DIAGNOSIS — E785 Hyperlipidemia, unspecified: Secondary | ICD-10-CM

## 2019-06-09 DIAGNOSIS — I25118 Atherosclerotic heart disease of native coronary artery with other forms of angina pectoris: Secondary | ICD-10-CM

## 2019-06-09 NOTE — Progress Notes (Signed)
Virtual Visit via Telephone Note   This visit type was conducted due to national recommendations for restrictions regarding the COVID-19 Pandemic (e.g. social distancing) in an effort to limit this patient's exposure and mitigate transmission in our community.  Due to his co-morbid illnesses, this patient is at least at moderate risk for complications without adequate follow up.  This format is felt to be most appropriate for this patient at this time.  The patient did not have access to video technology/had technical difficulties with video requiring transitioning to audio format only (telephone).  All issues noted in this document were discussed and addressed.  No physical exam could be performed with this format.  Please refer to the patient's chart for his  consent to telehealth for [facility].   Date:  06/09/2019   ID:  Ronald Faulkner, DOB 1951-05-06, MRN [id number]  Patient Location: Home Provider Location: Office  PCP:  Scotty Court, DO  Cardiologist:  Kate Sable, MD  Electrophysiologist:  None   Evaluation Performed:  Follow-Up Visit  Chief Complaint:  CAD  History of Present Illness:    Ronald Faulkner is a 69 y.o. male with coronary artery disease.  In summary, he has a history of coronary artery disease and underwent drug-eluting stent placement to the mid and distal LAD on 07/31/2017. He had moderately severe stenosis in a small caliber intermediate branch and medical management was recommended. There was moderate stenosis in the distal RCA, 50%.  Additional past medical history includes hypertension and type 2 diabetes mellitus.  He seldom has shooting pains in his chest which appear to be chronic. He denies shortness of breath, palpitations, and leg swelling.   Past Medical History:  Diagnosis Date  . Arthritis   . CAD (coronary artery disease)    a. s/p DES to mid and distal LAD in 07/2017 with medical management recommended of intermediate branch  .  Cancer ([place])   . Colon polyps   . Diabetes mellitus without complication ([place])   . Diverticulitis   . Hypertension   . IBS (irritable bowel syndrome)   . 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 Unstable angina ([place]) 07/2017   Past Surgical History:  Procedure Laterality Date  . ABDOMINAL SURGERY    . APPENDECTOMY    . BREAST SURGERY     benign lump at lt side  . COLON SURGERY    . COLONOSCOPY  03/14/2011   Procedure: COLONOSCOPY;  Surgeon: [name], MD;  [facility];  Service: Endoscopy;  Laterality: N/A;  1:00  . COLONOSCOPY N/A 11/15/2015   Procedure: COLONOSCOPY;  Surgeon: [name], MD;  [facility];  Service: Endoscopy;  Laterality: N/A;  11:15  . CORONARY STENT INTERVENTION N/A 07/31/2017   Procedure: CORONARY STENT INTERVENTION;  Surgeon: [name], MD;  Location: [place] CV LAB;  Service: Cardiovascular;  Laterality: N/A;  . LEFT HEART CATH AND CORONARY ANGIOGRAPHY N/A 07/31/2017   Procedure: LEFT HEART CATH AND CORONARY ANGIOGRAPHY;  Surgeon: [name], MD;  Location: [place] CV LAB;  Service: Cardiovascular;  Laterality: N/A;  . PENILE PROSTHESIS IMPLANT     2016   . prostate cancer     2015. prostectomy     Current Meds  Medication Sig  . allopurinol (ZYLOPRIM) 300 MG tablet Take 300 mg by mouth 2 (two) times daily.   Marland Kitchen aspirin EC 81 MG tablet Take 81 mg  by mouth daily.  . hydrochlorothiazide (HYDRODIURIL) 25 MG tablet Take 1 tablet by mouth daily.  . isosorbide mononitrate (IMDUR) 60 MG 24 hr tablet Take 1 tablet (60 mg total) by mouth daily.  Marland Kitchen linaclotide (LINZESS) 145 MCG CAPS capsule Take 145 mcg by mouth daily as needed (for constipation). As needed  . losartan (COZAAR) 100 MG tablet Take 100 mg by mouth daily.  . metFORMIN (GLUCOPHAGE) 500 MG tablet Take 1 tablet (500 mg total) by mouth 2 (two) times daily with a meal.  . metoprolol succinate (TOPROL-XL) 25 MG  24 hr tablet TAKE 1 & 1/2 (ONE & ONE-HALF) TABLETS BY MOUTH ONCE DAILY  . nitroGLYCERIN (NITROSTAT) 0.4 MG SL tablet DISSOLVE ONE TABLET UNDER THE TONGUE EVERY 5 MINUTES AS NEEDED FOR CHEST PAIN.  DO NOT EXCEED A TOTAL OF 3 DOSES IN 15 MINUTES  . oxymetazoline (12 HOUR NASAL SPRAY) 0.05 % nasal spray Place 1 spray into both nostrils as needed.   . promethazine (PHENERGAN) 25 MG tablet Take 1 tablet (25 mg total) by mouth every 6 (six) hours as needed for nausea or vomiting.  . rosuvastatin (CRESTOR) 5 MG tablet TAKE 1 TABLET BY MOUTH ONCE DAILY ON  MONDAY,  WEDNESDAY,  AND  FRIDAY  ONLY  STOPPING  LIPITOR     Allergies:   Bee venom, Fish allergy, and Ivp dye [iodinated diagnostic agents]   Social History   Tobacco Use  . Smoking status: Former Smoker    Packs/day: 1.00    Years: 35.00    Pack years: 35.00    Types: Cigarettes    Quit date: 06/03/1999    Years since quitting: 20.0  . Smokeless tobacco: Never Used  . Tobacco comment: quit 16 years ago  Substance Use Topics  . Alcohol use: Yes    Comment: occ  . Drug use: No    Comment: hx of use in his colege days     Family Hx: The patient's family history includes Colon cancer in his mother; Coronary artery disease in his maternal grandmother. There is no history of Anesthesia problems, Hypotension, Malignant hyperthermia, or Pseudochol deficiency.  ROS:   Please see the history of present illness.     All other systems reviewed and are negative.   Prior CV studies:   The following studies were reviewed today:  Echocardiogram: 10/13/18  Study Conclusions   1. The left ventricle has normal systolic function with an ejection fraction of 60-65%. The cavity size was normal. Left ventricular diastolic Doppler parameters are consistent with impaired relaxation. No evidence of left ventricular regional wall  motion abnormalities.  2. The right ventricle has normal systolic function. The cavity was normal. There is no increase in  right ventricular wall thickness.  3. The aortic valve is tricuspid. Mild aortic annular calcification noted.  4. The mitral valve is grossly normal. There is mild mitral annular calcification present.  5. The tricuspid valve is grossly normal.  6. The aortic root is normal in size and structure.  Cardiac Catheterization: 07/2017  Prox RCA to Mid RCA lesion is 20% stenosed.  Dist RCA lesion is 50% stenosed.  Dist Cx lesion is 60% stenosed.  Ost Cx to Prox Cx lesion is 20% stenosed.  Ost 1st Mrg to 1st Mrg lesion is 70% stenosed.  Ost LAD to Prox LAD lesion is 20% stenosed.  Mid LAD-1 lesion is 80% stenosed.  Mid LAD-2 lesion is 95% stenosed.  Dist LAD lesion is 70% stenosed.  A drug-eluting  stent was successfully placed using a STENT SIERRA 2.25 X 12 MM.  Post intervention, there is a 0% residual stenosis.  A drug-eluting stent was successfully placed using a STENT SIERRA 2.50 X 18 MM.  Post intervention, there is a 0% residual stenosis.  1. Severe stenosis in the mid and distal LAD. Successful PTCA/DES x 1 distal LAD and PTCA/DES x 1 mid LAD.  2. Modeately severe stenosis in small caliber intermediate branch.  3. Moderate stenosis in the distal RCA  Recommendations: Will continue DAPT with ASA and Plavix for at least one year. Will continue beta blocker and statin. I would recommend medical management of the disease in the small caliber intermediate branch.   Labs/Other Tests and Data Reviewed:    EKG:  No ECG reviewed.  Recent Labs: 06/28/2018: ALT 34; BUN 21; Creatinine, Ser 1.29; Potassium 4.0; Sodium 137 07/01/2018: Hemoglobin 11.5; Platelets 301   Recent Lipid Panel Lab Results  Component Value Date/Time   CHOL 195 05/27/2017 08:44 AM   TRIG 253 (H) 05/27/2017 08:44 AM   HDL 45 05/27/2017 08:44 AM   CHOLHDL 4.3 05/27/2017 08:44 AM   LDLCALC 99 05/27/2017 08:44 AM    Wt Readings from Last 3 Encounters:  06/09/19 165 lb (74.8 kg)  10/04/18 158 lb (71.7  kg)  07/22/18 164 lb (74.4 kg)     Objective:    Vital Signs:  BP 135/85   Pulse 89   Ht 5\' 7"  (1.702 m)   Wt 165 lb (74.8 kg)   BMI 25.84 kg/m    VITAL SIGNS:  reviewed  ASSESSMENT & PLAN:    1. CAD/chest pain: Coronary angiography details reviewed above with mid and distal LAD stent placement in March 2019. Continue ASA, Toprol-XL, Imdur, and statin. He did not tolerate atorvastatin 40 mg dailyand is taking Crestor 3 days a week along with Zetia. He continues to have episodic chest pain and did not take the increased dose of Imdur. I encouraged him to take 60 mg daily.  2. HTN: BP is normal.  No changes to therapy.  3. HLD: [id number] on 03/24/2018. He did not tolerate atorvastatin 40 mg daily.He takes Crestor 5 mg every Monday, Wednesday, and Friday. He is no longer on Zetia. He does have some myalgias but they are not as severe as when he took Lipitor. I will obtain a copy of lipids from PCP.   COVID-19 Education: The signs and symptoms of COVID-19 were discussed with the patient and how to seek care for testing (follow up with PCP or arrange E-visit).  The importance of social distancing was discussed today.  Time:   Today, I have spent 10 minutes with the patient with telehealth technology discussing the above problems.     Medication Adjustments/Labs and Tests Ordered: Current medicines are reviewed at length with the patient today.  Concerns regarding medicines are outlined above.   Tests Ordered: No orders of the defined types were placed in this encounter.   Medication Changes: No orders of the defined types were placed in this encounter.   Follow Up:  Virtual Visit  in 6 month(s)  Signed, Kate Sable, MD  06/09/2019 2:16 PM    [place]

## 2019-06-09 NOTE — Patient Instructions (Signed)

## 2019-06-10 ENCOUNTER — Encounter: Payer: Self-pay | Admitting: *Deleted

## 2019-06-20 ENCOUNTER — Other Ambulatory Visit: Payer: Self-pay | Admitting: Cardiovascular Disease

## 2019-07-07 ENCOUNTER — Encounter (INDEPENDENT_AMBULATORY_CARE_PROVIDER_SITE_OTHER): Payer: Self-pay | Admitting: *Deleted

## 2019-07-07 ENCOUNTER — Encounter (INDEPENDENT_AMBULATORY_CARE_PROVIDER_SITE_OTHER): Payer: Self-pay | Admitting: Internal Medicine

## 2019-07-07 ENCOUNTER — Other Ambulatory Visit (INDEPENDENT_AMBULATORY_CARE_PROVIDER_SITE_OTHER): Payer: Self-pay | Admitting: *Deleted

## 2019-07-07 ENCOUNTER — Ambulatory Visit (INDEPENDENT_AMBULATORY_CARE_PROVIDER_SITE_OTHER): Payer: Medicare HMO | Admitting: Internal Medicine

## 2019-07-07 ENCOUNTER — Telehealth (INDEPENDENT_AMBULATORY_CARE_PROVIDER_SITE_OTHER): Payer: Self-pay | Admitting: *Deleted

## 2019-07-07 ENCOUNTER — Other Ambulatory Visit: Payer: Self-pay

## 2019-07-07 VITALS — BP 125/80 | HR 90 | Temp 97.2°F | Ht 67.0 in | Wt 165.8 lb

## 2019-07-07 DIAGNOSIS — K219 Gastro-esophageal reflux disease without esophagitis: Secondary | ICD-10-CM | POA: Diagnosis not present

## 2019-07-07 DIAGNOSIS — K5900 Constipation, unspecified: Secondary | ICD-10-CM | POA: Diagnosis not present

## 2019-07-07 DIAGNOSIS — Z860100 Personal history of colon polyps, unspecified: Secondary | ICD-10-CM

## 2019-07-07 DIAGNOSIS — K5909 Other constipation: Secondary | ICD-10-CM | POA: Insufficient documentation

## 2019-07-07 MED ORDER — LINACLOTIDE 72 MCG PO CAPS: 72.0000 ug | ORAL_CAPSULE | Freq: Every day | ORAL | 5 refills | Status: DC

## 2019-07-07 MED ORDER — FAMOTIDINE 20 MG PO TABS: 20.0000 mg | ORAL_TABLET | Freq: Two times a day (BID) | ORAL | Status: DC | PRN

## 2019-07-07 MED ORDER — METAMUCIL SMOOTH TEXTURE 58.6 % PO POWD: 1.0000 | Freq: Every day | ORAL | 12 refills | Status: DC

## 2019-07-07 NOTE — Telephone Encounter (Signed)
Patient needs suprep TCS sch'd 2/12

## 2019-07-07 NOTE — Progress Notes (Addendum)
Presenting complaint;  Constipation LLQ abdominal pain. History of colonic polyps and diverticulitis.  History of present illness for  Patient is 69 year old African-American male who is well-known to me from prior evaluations.  He was last seen in our office in January 2020 following a visit to the emergency room for rectal bleeding.  He was felt to have diverticulitis.  He recalls that he had syncopal episode when he was sitting in a commode at home.  He was discharged on antibiotics.  Patient states that he has not had any more episodes of rectal bleeding. He complains of intermittent burning pain in left lower quadrant.  Pain occurs during and after defecation and may last for 10 to 15 minutes.  He has no pain at this time.  Since he has history of diverticulitis he is always worried that he is having an episode.  He also complains of constipation.  He has been using Linzess/linaclotide 145 mcg on as-needed basis.  He says his stool consistency is all over.  He could have normal stool loose stool and other times he would be passing hard balls.  He states he has changed his eating habits and is trying to eat more fruits and vegetables.  He denies melena.  He has heartburn 3-4 times a week.  He is presently not taking any medications.  He denies dysphagia.  He does complain of sporadic nausea without vomiting.  He is not sure of nausea occurs before or after meals.  He has not experienced any abdominal pain with his nausea. He has good appetite.  He has gained 8 pounds in the last 1 year.  He says he is not doing any walking or exercise.  His last colonoscopy was in June 2017 revealing patent rectosigmoid anastomosis and and colonic diverticulosis.  Current Medications: Outpatient Encounter Medications as of 07/07/2019  Medication Sig  . allopurinol (ZYLOPRIM) 300 MG tablet Take 300 mg by mouth 2 (two) times daily.   Marland Kitchen aspirin EC 81 MG tablet Take 81 mg by mouth daily.  . hydrochlorothiazide  (HYDRODIURIL) 25 MG tablet Take 1 tablet by mouth daily.  . isosorbide mononitrate (IMDUR) 60 MG 24 hr tablet Take 1 tablet (60 mg total) by mouth daily.  Marland Kitchen linaclotide (LINZESS) 145 MCG CAPS capsule Take 145 mcg by mouth daily as needed (for constipation). As needed  . losartan (COZAAR) 100 MG tablet Take 100 mg by mouth daily.  . metFORMIN (GLUCOPHAGE) 500 MG tablet Take 1 tablet (500 mg total) by mouth 2 (two) times daily with a meal.  . metoprolol succinate (TOPROL-XL) 25 MG 24 hr tablet TAKE 1 & 1/2 (ONE & ONE-HALF) TABLETS BY MOUTH ONCE DAILY  . montelukast (SINGULAIR) 10 MG tablet Take 10 mg by mouth at bedtime.  . nitroGLYCERIN (NITROSTAT) 0.4 MG SL tablet DISSOLVE ONE TABLET UNDER THE TONGUE EVERY 5 MINUTES AS NEEDED FOR CHEST PAIN.  DO NOT EXCEED A TOTAL OF 3 DOSES IN 15 MINUTES  . oxymetazoline (12 HOUR NASAL SPRAY) 0.05 % nasal spray Place 1 spray into both nostrils as needed.   . promethazine (PHENERGAN) 25 MG tablet Take 1 tablet (25 mg total) by mouth every 6 (six) hours as needed for nausea or vomiting.  . rosuvastatin (CRESTOR) 5 MG tablet TAKE 1 TABLET BY MOUTH ONCE DAILY ON  MONDAY,  WEDNESDAY,  AND  FRIDAY  ONLY  STOPPING  LIPITOR   No facility-administered encounter medications on file as of 07/07/2019.   Past medical history  Hypertension Hyperlipidemia  Chronic low back pain. Diabetes mellitus since mid 1990s. History of IBS. GERD. History of colonic adenomas.  First colonoscopy was in 1991 with removal of large pedunculated adenoma.  Last colonoscopy in June 2017.  No polyps. History of diverticulitis.  Last treated in January 2020. Gout. Coronary artery disease.  Status post DES to mid and distal LAD in March 2019.  Benign cyst removed from left breast in 1985. Right wrist surgery for fracture in 2000. Laparotomy with resection of sigmoid  colon for diverticulitis in 2007 with incidental appendectomy. 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 Penile  prosthesis insertion in November 2016.  Allergies  Allergies  Allergen Reactions  . Bee Venom   . Fish Allergy Hives and Swelling  . Ivp Dye [Iodinated Diagnostic Agents] Swelling     Family history  Mother was diagnosed with colon carcinoma at age 59 and died within 9 months. Father had COPD and also died at age 52. He has 1 sister age 77 in good health. He had 2 half brothers.  One died in auto accident at age 82 and other one is age 30 in good health.  Social history  He is married.  This is his third marriage.  He has two children with his first wife.  His son is 93 years old with IDDM.  His daughter is age 79 and in good health. He smokes cigarettes for 20+ years and quit 23 years ago.  He smoked about a pack a day.  He has 1-2 drinks every day.  He either drinks beer whiskey or wine.  He says he never drinks more than 2/day. 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  Physical examination  Blood pressure 125/80, pulse 90, temperature (!) 97.2 F (36.2 C), temperature source Temporal, height 5\' 7"  (1.702 m), weight 165 lb 12.8 oz (75.2 kg). Patient is alert and in no acute distress. He is wearing a facial mask. Conjunctiva is pink. Sclera is nonicteric Oropharyngeal mucosa is normal. He has upper dental plate and his own teeth and lower jaw.  Some are missing. No neck masses or thyromegaly noted. Cardiac exam with regular rhythm normal S1 and S2. No murmur or gallop noted. Lungs are clear to auscultation. Abdomen is symmetrical.  He has long midline scar.  He has small umbilical hernia which is completely reducible.  He also has hernia of linea alba and upper abdomen.  On palpation abdomen is soft and nontender without organomegaly or masses. Rectal examination deferred. No LE edema or clubbing noted.  Labs/studies Results:  Lab data from 07/01/2018 WBC 8.0 H&H 11.5 and 34.9 Platelet count  301K.  Assessment:  #1.  Chronic constipation.  He is on Linzess/linaclotide but he is taking it on as-needed basis which I think is not a good idea.  Since he is prone to constipation he needs to be taking medication on schedule hoping to prevent him from being constipated.  We will try him on low-dose but on schedule. He would also benefit from taking fiber supplement daily.  #2.  LLQ abdominal pain.  He has burning pain which occurs during and after defecation and is most likely due to IBS.  His abdominal examination today is benign.  Therefore I doubt that he has diverticulitis.  He had part of his colon removed in 2007 and he was last treated in January 2020.  #3.  History of colonic polyps.  He had large  pedunculated adenoma removed back in 1991.  He has had scheduled colonoscopies periodically last examination was in July 2017.  Family history significant for CRC in mother at age 17.  She died within 3 months of diagnosis.  Therefore patient's risk is high.  #4.  History of rectal bleeding.  He had a large volume painless hematochezia in January 2020 when he was seen in emergency room.  His hemoglobin then was 11.5.  I do not see recent hemoglobins.  Unless he has had one by PCP will check CBC.  #5.  GERD.  He is having heartburn at least three times a week.  He is watching his diet.  He can use famotidine OTC 20 mg twice daily as needed.  Recommendations  Continue high-fiber diet. Metamucil one heaping tablespoonful or 1 packet daily at bedtime. Discontinue Linzess/linaclotide 145 mcg daily. Begin Linzess/linaclotide 72 mcg by mouth daily.  He can take in the morning or later in the day which suits his schedule. Request copy of recent blood work from PCPs office. Colonoscopy both for diagnostic and surveillance purposes in near future.  Addendum. Patient was given 1 month supply of Linzess 72 mcg dose

## 2019-07-07 NOTE — H&P (View-Only) (Signed)
Presenting complaint;  Constipation LLQ abdominal pain. History of colonic polyps and diverticulitis.  History of present illness for  Patient is 69 year old African-American male who is well-known to me from prior evaluations.  He was last seen in our office in January 2020 following a visit to the emergency room for rectal bleeding.  He was felt to have diverticulitis.  He recalls that he had syncopal episode when he was sitting in a commode at home.  He was discharged on antibiotics.  Patient states that he has not had any more episodes of rectal bleeding. He complains of intermittent burning pain in left lower quadrant.  Pain occurs during and after defecation and may last for 10 to 15 minutes.  He has no pain at this time.  Since he has history of diverticulitis he is always worried that he is having an episode.  He also complains of constipation.  He has been using Linzess/linaclotide 145 mcg on as-needed basis.  He says his stool consistency is all over.  He could have normal stool loose stool and other times he would be passing hard balls.  He states he has changed his eating habits and is trying to eat more fruits and vegetables.  He denies melena.  He has heartburn 3-4 times a week.  He is presently not taking any medications.  He denies dysphagia.  He does complain of sporadic nausea without vomiting.  He is not sure of nausea occurs before or after meals.  He has not experienced any abdominal pain with his nausea. He has good appetite.  He has gained 8 pounds in the last 1 year.  He says he is not doing any walking or exercise.  His last colonoscopy was in June 2017 revealing patent rectosigmoid anastomosis and and colonic diverticulosis.  Current Medications: Outpatient Encounter Medications as of 07/07/2019  Medication Sig  . allopurinol (ZYLOPRIM) 300 MG tablet Take 300 mg by mouth 2 (two) times daily.   Marland Kitchen aspirin EC 81 MG tablet Take 81 mg by mouth daily.  . hydrochlorothiazide  (HYDRODIURIL) 25 MG tablet Take 1 tablet by mouth daily.  . isosorbide mononitrate (IMDUR) 60 MG 24 hr tablet Take 1 tablet (60 mg total) by mouth daily.  Marland Kitchen linaclotide (LINZESS) 145 MCG CAPS capsule Take 145 mcg by mouth daily as needed (for constipation). As needed  . losartan (COZAAR) 100 MG tablet Take 100 mg by mouth daily.  . metFORMIN (GLUCOPHAGE) 500 MG tablet Take 1 tablet (500 mg total) by mouth 2 (two) times daily with a meal.  . metoprolol succinate (TOPROL-XL) 25 MG 24 hr tablet TAKE 1 & 1/2 (ONE & ONE-HALF) TABLETS BY MOUTH ONCE DAILY  . montelukast (SINGULAIR) 10 MG tablet Take 10 mg by mouth at bedtime.  . nitroGLYCERIN (NITROSTAT) 0.4 MG SL tablet DISSOLVE ONE TABLET UNDER THE TONGUE EVERY 5 MINUTES AS NEEDED FOR CHEST PAIN.  DO NOT EXCEED A TOTAL OF 3 DOSES IN 15 MINUTES  . oxymetazoline (12 HOUR NASAL SPRAY) 0.05 % nasal spray Place 1 spray into both nostrils as needed.   . promethazine (PHENERGAN) 25 MG tablet Take 1 tablet (25 mg total) by mouth every 6 (six) hours as needed for nausea or vomiting.  . rosuvastatin (CRESTOR) 5 MG tablet TAKE 1 TABLET BY MOUTH ONCE DAILY ON  MONDAY,  WEDNESDAY,  AND  FRIDAY  ONLY  STOPPING  LIPITOR   No facility-administered encounter medications on file as of 07/07/2019.   Past medical history  Hypertension Hyperlipidemia  Chronic low back pain. Diabetes mellitus since mid 1990s. History of IBS. GERD. History of colonic adenomas.  First colonoscopy was in 1991 with removal of large pedunculated adenoma.  Last colonoscopy in June 2017.  No polyps. History of diverticulitis.  Last treated in January 2020. Gout. Coronary artery disease.  Status post DES to mid and distal LAD in March 2019.  Benign cyst removed from left breast in 1985. Right wrist surgery for fracture in 2000. Laparotomy with resection of sigmoid  colon for diverticulitis in 2007 with incidental appendectomy. 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 Penile  prosthesis insertion in November 2016.  Allergies  Allergies  Allergen Reactions  . Bee Venom   . Fish Allergy Hives and Swelling  . Ivp Dye [Iodinated Diagnostic Agents] Swelling     Family history  Mother was diagnosed with colon carcinoma at age 30 and died within 60 months. Father had COPD and also died at age 91. He has 1 sister age 1 in good health. He had 2 half brothers.  One died in auto accident at age 5 and other one is age 74 in good health.  Social history  He is married.  This is his third marriage.  He has two children with his first wife.  His son is 19 years old with IDDM.  His daughter is age 33 and in good health. He smokes cigarettes for 20+ years and quit 23 years ago.  He smoked about a pack a day.  He has 1-2 drinks every day.  He either drinks beer whiskey or wine.  He says he never drinks more than 2/day. 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  Physical examination  Blood pressure 125/80, pulse 90, temperature (!) 97.2 F (36.2 C), temperature source Temporal, height 5\' 7"  (1.702 m), weight 165 lb 12.8 oz (75.2 kg). Patient is alert and in no acute distress. He is wearing a facial mask. Conjunctiva is pink. Sclera is nonicteric Oropharyngeal mucosa is normal. He has upper dental plate and his own teeth and lower jaw.  Some are missing. No neck masses or thyromegaly noted. Cardiac exam with regular rhythm normal S1 and S2. No murmur or gallop noted. Lungs are clear to auscultation. Abdomen is symmetrical.  He has long midline scar.  He has small umbilical hernia which is completely reducible.  He also has hernia of linea alba and upper abdomen.  On palpation abdomen is soft and nontender without organomegaly or masses. Rectal examination deferred. No LE edema or clubbing noted.  Labs/studies Results:  Lab data from 07/01/2018 WBC 8.0 H&H 11.5 and 34.9 Platelet count  301K.  Assessment:  #1.  Chronic constipation.  He is on Linzess/linaclotide but he is taking it on as-needed basis which I think is not a good idea.  Since he is prone to constipation he needs to be taking medication on schedule hoping to prevent him from being constipated.  We will try him on low-dose but on schedule. He would also benefit from taking fiber supplement daily.  #2.  LLQ abdominal pain.  He has burning pain which occurs during and after defecation and is most likely due to IBS.  His abdominal examination today is benign.  Therefore I doubt that he has diverticulitis.  He had part of his colon removed in 2007 and he was last treated in January 2020.  #3.  History of colonic polyps.  He had large  pedunculated adenoma removed back in 1991.  He has had scheduled colonoscopies periodically last examination was in July 2017.  Family history significant for CRC in mother at age 67.  She died within 3 months of diagnosis.  Therefore patient's risk is high.  #4.  History of rectal bleeding.  He had a large volume painless hematochezia in January 2020 when he was seen in emergency room.  His hemoglobin then was 11.5.  I do not see recent hemoglobins.  Unless he has had one by PCP will check CBC.  #5.  GERD.  He is having heartburn at least three times a week.  He is watching his diet.  He can use famotidine OTC 20 mg twice daily as needed.  Recommendations  Continue high-fiber diet. Metamucil one heaping tablespoonful or 1 packet daily at bedtime. Discontinue Linzess/linaclotide 145 mcg daily. Begin Linzess/linaclotide 72 mcg by mouth daily.  He can take in the morning or later in the day which suits his schedule. Request copy of recent blood work from PCPs office. Colonoscopy both for diagnostic and surveillance purposes in near future.  Addendum. Patient was given 1 month supply of Linzess 72 mcg dose

## 2019-07-07 NOTE — Patient Instructions (Signed)
Take Metamucil either heaping tablespoonful or 1 packet daily at bedtime. Keep stool diary as to frequency and consistency of stools until the time of colonoscopy. Colonoscopy to be scheduled in near future.

## 2019-07-08 ENCOUNTER — Other Ambulatory Visit (INDEPENDENT_AMBULATORY_CARE_PROVIDER_SITE_OTHER): Payer: Self-pay | Admitting: *Deleted

## 2019-07-08 DIAGNOSIS — Z86010 Personal history of colonic polyps: Secondary | ICD-10-CM

## 2019-07-08 DIAGNOSIS — K5732 Diverticulitis of large intestine without perforation or abscess without bleeding: Secondary | ICD-10-CM

## 2019-07-08 DIAGNOSIS — K5900 Constipation, unspecified: Secondary | ICD-10-CM

## 2019-07-08 MED ORDER — SUPREP BOWEL PREP KIT 17.5-3.13-1.6 GM/177ML PO SOLN: 1.0000 | Freq: Once | ORAL | 0 refills | Status: AC

## 2019-07-08 NOTE — Patient Instructions (Signed)
Your procedure is scheduled on: 07/15/19  Report to [facility] at  11:00   AM.  Call this number if you have problems the morning of surgery: [phone]   Remember:              Follow Directions on the letter you received from Your Physician's office regarding the Bowel Prep              No Smoking the day of Procedure :   Take these medicines the morning of surgery with A SIP OF WATER: isosorbide, metoprolol and losartan   Do not wear jewelry, make-up or nail polish.    Do not bring valuables to the hospital.  Contacts, dentures or bridgework may not be worn into surgery.  .   Patients discharged the day of surgery will not be allowed to drive home.     Colonoscopy, Adult, Care After This sheet gives you information about how to care for yourself after your procedure. Your health care provider may also give you more specific instructions. If you have problems or questions, contact your health care provider. What can I expect after the procedure? After the procedure, it is common to have:  A small amount of blood in your stool for 24 hours after the procedure.  Some gas.  Mild abdominal cramping or bloating.  Follow these instructions at home: General instructions   For the first 24 hours after the procedure: ? Do not drive or use machinery. ? Do not sign important documents. ? Do not drink alcohol. ? Do your regular daily activities at a slower pace than normal. ? Eat soft, easy-to-digest foods. ? Rest often.  Take over-the-counter or prescription medicines only as told by your health care provider.  It is up to you to get the results of your procedure. Ask your health care provider, or the department performing the procedure, when your results will be ready. Relieving cramping and bloating  Try walking around when you have cramps or feel bloated.  Apply heat to your abdomen as told by your health care provider. Use a heat source that your health care provider  recommends, such as a moist heat pack or a heating pad. ? Place a towel between your skin and the heat source. ? Leave the heat on for 20-30 minutes. ? Remove the heat if your skin turns bright red. This is especially important if you are unable to feel pain, heat, or cold. You may have a greater risk of getting burned. Eating and drinking  Drink enough fluid to keep your urine clear or pale yellow.  Resume your normal diet as instructed by your health care provider. Avoid heavy or fried foods that are hard to digest.  Avoid drinking alcohol for as long as instructed by your health care provider. Contact a health care provider if:  You have blood in your stool 2-3 days after the procedure. Get help right away if:  You have more than a small spotting of blood in your stool.  You pass large blood clots in your stool.  Your abdomen is swollen.  You have nausea or vomiting.  You have a fever.  You have increasing abdominal pain that is not relieved with medicine. This information is not intended to replace advice given to you by your health care provider. Make sure you discuss any questions you have with your health care provider. Document Released: 01/01/2004 Document Revised: 02/11/2016 Document Reviewed: 07/31/2015 Elsevier Interactive Patient Education  2018  [employer].

## 2019-07-13 ENCOUNTER — Other Ambulatory Visit: Payer: Self-pay

## 2019-07-13 ENCOUNTER — Encounter (HOSPITAL_COMMUNITY)
Admission: RE | Admit: 2019-07-13 | Discharge: 2019-07-13 | Disposition: A | Discharge status: Home / Self Care | Payer: Medicare HMO | Source: Ambulatory Visit | Attending: Internal Medicine | Admitting: Internal Medicine

## 2019-07-13 ENCOUNTER — Other Ambulatory Visit (HOSPITAL_COMMUNITY)
Admission: RE | Admit: 2019-07-13 | Discharge: 2019-07-13 | Disposition: A | Discharge status: Home / Self Care | Payer: Medicare HMO | Source: Ambulatory Visit | Attending: Internal Medicine | Admitting: Internal Medicine

## 2019-07-13 ENCOUNTER — Encounter (HOSPITAL_COMMUNITY): Payer: Self-pay

## 2019-07-13 DIAGNOSIS — Z20822 Contact with and (suspected) exposure to covid-19: Secondary | ICD-10-CM | POA: Diagnosis not present

## 2019-07-13 DIAGNOSIS — Z01812 Encounter for preprocedural laboratory examination: Secondary | ICD-10-CM | POA: Diagnosis present

## 2019-07-13 DIAGNOSIS — I4510 Unspecified right bundle-branch block: Secondary | ICD-10-CM | POA: Insufficient documentation

## 2019-07-13 DIAGNOSIS — Z01810 Encounter for preprocedural cardiovascular examination: Secondary | ICD-10-CM | POA: Insufficient documentation

## 2019-07-13 HISTORY — DX: Heart failure, unspecified: I50.9

## 2019-07-13 HISTORY — DX: Personal history of urinary calculi: Z87.442

## 2019-07-13 LAB — SARS CORONAVIRUS 2 (TAT 6-24 HRS): SARS Coronavirus 2: NEGATIVE

## 2019-07-13 LAB — BASIC METABOLIC PANEL
Anion gap: 11 (ref 5–15)
BUN: 20 mg/dL (ref 8–23)
CO2: 19 mmol/L — ABNORMAL LOW (ref 22–32)
Calcium: 9 mg/dL (ref 8.9–10.3)
Chloride: 108 mmol/L (ref 98–111)
Creatinine, Ser: 1.03 mg/dL (ref 0.61–1.24)
GFR calc Af Amer: >60 mL/min (ref >60)
GFR calc non Af Amer: >60 mL/min (ref >60)
Glucose, Bld: 157 mg/dL — ABNORMAL HIGH (ref 70–99)
Potassium: 4.7 mmol/L (ref 3.5–5.1)
Sodium: 138 mmol/L (ref 135–145)

## 2019-07-13 NOTE — Pre-Procedure Instructions (Signed)
Reviewed cardiac history with Dr Charna Elizabeth. No orders given.

## 2019-07-15 ENCOUNTER — Ambulatory Visit (HOSPITAL_COMMUNITY): Payer: Medicare HMO | Admitting: Anesthesiology

## 2019-07-15 ENCOUNTER — Other Ambulatory Visit: Payer: Self-pay

## 2019-07-15 ENCOUNTER — Encounter (HOSPITAL_COMMUNITY): Payer: Self-pay | Admitting: Internal Medicine

## 2019-07-15 ENCOUNTER — Encounter (HOSPITAL_COMMUNITY)
Admission: RE | Disposition: A | Discharge status: Home / Self Care | Payer: Self-pay | Source: Home / Self Care | Attending: Internal Medicine

## 2019-07-15 ENCOUNTER — Ambulatory Visit (HOSPITAL_COMMUNITY)
Admission: RE | Admit: 2019-07-15 | Discharge: 2019-07-15 | Disposition: A | Discharge status: Home / Self Care | Payer: Medicare HMO | Attending: Internal Medicine | Admitting: Internal Medicine

## 2019-07-15 DIAGNOSIS — K644 Residual hemorrhoidal skin tags: Secondary | ICD-10-CM | POA: Insufficient documentation

## 2019-07-15 DIAGNOSIS — Z91041 Radiographic dye allergy status: Secondary | ICD-10-CM | POA: Insufficient documentation

## 2019-07-15 DIAGNOSIS — Z79899 Other long term (current) drug therapy: Secondary | ICD-10-CM | POA: Diagnosis not present

## 2019-07-15 DIAGNOSIS — Z980 Intestinal bypass and anastomosis status: Secondary | ICD-10-CM | POA: Diagnosis not present

## 2019-07-15 DIAGNOSIS — K5732 Diverticulitis of large intestine without perforation or abscess without bleeding: Secondary | ICD-10-CM

## 2019-07-15 DIAGNOSIS — Z8546 Personal history of malignant neoplasm of prostate: Secondary | ICD-10-CM | POA: Diagnosis not present

## 2019-07-15 DIAGNOSIS — K5900 Constipation, unspecified: Secondary | ICD-10-CM

## 2019-07-15 DIAGNOSIS — Z800 Family history of malignant neoplasm of digestive organs: Secondary | ICD-10-CM | POA: Insufficient documentation

## 2019-07-15 DIAGNOSIS — E119 Type 2 diabetes mellitus without complications: Secondary | ICD-10-CM | POA: Diagnosis not present

## 2019-07-15 DIAGNOSIS — M545 Low back pain: Secondary | ICD-10-CM | POA: Diagnosis not present

## 2019-07-15 DIAGNOSIS — Z86010 Personal history of colonic polyps: Secondary | ICD-10-CM | POA: Diagnosis not present

## 2019-07-15 DIAGNOSIS — Z87891 Personal history of nicotine dependence: Secondary | ICD-10-CM | POA: Insufficient documentation

## 2019-07-15 DIAGNOSIS — E785 Hyperlipidemia, unspecified: Secondary | ICD-10-CM | POA: Insufficient documentation

## 2019-07-15 DIAGNOSIS — Z7982 Long term (current) use of aspirin: Secondary | ICD-10-CM | POA: Insufficient documentation

## 2019-07-15 DIAGNOSIS — K5730 Diverticulosis of large intestine without perforation or abscess without bleeding: Secondary | ICD-10-CM | POA: Diagnosis not present

## 2019-07-15 DIAGNOSIS — Z09 Encounter for follow-up examination after completed treatment for conditions other than malignant neoplasm: Secondary | ICD-10-CM | POA: Diagnosis not present

## 2019-07-15 DIAGNOSIS — I4510 Unspecified right bundle-branch block: Secondary | ICD-10-CM | POA: Insufficient documentation

## 2019-07-15 DIAGNOSIS — K219 Gastro-esophageal reflux disease without esophagitis: Secondary | ICD-10-CM | POA: Diagnosis not present

## 2019-07-15 DIAGNOSIS — K581 Irritable bowel syndrome with constipation: Secondary | ICD-10-CM | POA: Insufficient documentation

## 2019-07-15 DIAGNOSIS — I10 Essential (primary) hypertension: Secondary | ICD-10-CM | POA: Insufficient documentation

## 2019-07-15 DIAGNOSIS — Z1211 Encounter for screening for malignant neoplasm of colon: Secondary | ICD-10-CM | POA: Insufficient documentation

## 2019-07-15 DIAGNOSIS — Z7984 Long term (current) use of oral hypoglycemic drugs: Secondary | ICD-10-CM | POA: Insufficient documentation

## 2019-07-15 DIAGNOSIS — M1990 Unspecified osteoarthritis, unspecified site: Secondary | ICD-10-CM | POA: Insufficient documentation

## 2019-07-15 DIAGNOSIS — Z955 Presence of coronary angioplasty implant and graft: Secondary | ICD-10-CM | POA: Diagnosis not present

## 2019-07-15 DIAGNOSIS — I445 Left posterior fascicular block: Secondary | ICD-10-CM | POA: Insufficient documentation

## 2019-07-15 DIAGNOSIS — Z825 Family history of asthma and other chronic lower respiratory diseases: Secondary | ICD-10-CM | POA: Insufficient documentation

## 2019-07-15 DIAGNOSIS — Z91030 Bee allergy status: Secondary | ICD-10-CM | POA: Insufficient documentation

## 2019-07-15 DIAGNOSIS — I452 Bifascicular block: Secondary | ICD-10-CM | POA: Diagnosis not present

## 2019-07-15 DIAGNOSIS — G8929 Other chronic pain: Secondary | ICD-10-CM | POA: Insufficient documentation

## 2019-07-15 DIAGNOSIS — M109 Gout, unspecified: Secondary | ICD-10-CM | POA: Diagnosis not present

## 2019-07-15 DIAGNOSIS — I2510 Atherosclerotic heart disease of native coronary artery without angina pectoris: Secondary | ICD-10-CM | POA: Insufficient documentation

## 2019-07-15 DIAGNOSIS — Z91013 Allergy to seafood: Secondary | ICD-10-CM | POA: Insufficient documentation

## 2019-07-15 HISTORY — PX: COLONOSCOPY WITH PROPOFOL: SHX5780

## 2019-07-15 LAB — GLUCOSE, CAPILLARY
Glucose-Capillary: 127 mg/dL — ABNORMAL HIGH (ref 70–99)
Glucose-Capillary: 134 mg/dL — ABNORMAL HIGH (ref 70–99)

## 2019-07-15 SURGERY — COLONOSCOPY WITH PROPOFOL
Anesthesia: General

## 2019-07-15 MED ORDER — CHLORHEXIDINE GLUCONATE CLOTH 2 % EX PADS: 6.0000 | MEDICATED_PAD | Freq: Once | CUTANEOUS | Status: DC

## 2019-07-15 MED ORDER — LACTATED RINGERS IV SOLN: Freq: Once | INTRAVENOUS | Status: AC

## 2019-07-15 MED ORDER — KETAMINE HCL 10 MG/ML IJ SOLN
INTRAMUSCULAR | Status: DC | PRN
  Administered 2019-07-15: 20 mg via INTRAVENOUS

## 2019-07-15 MED ORDER — PROPOFOL 10 MG/ML IV BOLUS
INTRAVENOUS | Status: DC | PRN
  Administered 2019-07-15: 40 mg via INTRAVENOUS

## 2019-07-15 MED ORDER — PROPOFOL 500 MG/50ML IV EMUL
INTRAVENOUS | Status: DC | PRN
  Administered 2019-07-15: 200 ug/kg/min via INTRAVENOUS

## 2019-07-15 MED ORDER — KETAMINE HCL 50 MG/5ML IJ SOSY
PREFILLED_SYRINGE | INTRAMUSCULAR | Status: AC
  Filled 2019-07-15: qty 5

## 2019-07-15 MED ORDER — LACTATED RINGERS IV SOLN: INTRAVENOUS | Status: DC | PRN

## 2019-07-15 NOTE — Anesthesia Postprocedure Evaluation (Signed)
Anesthesia Post Note  Patient: Ronald Faulkner  Procedure(s) Performed: COLONOSCOPY WITH PROPOFOL (N/A )  Patient location during evaluation: Phase II Anesthesia Type: General Level of consciousness: awake and alert and patient cooperative Pain management: satisfactory to patient Respiratory status: spontaneous breathing Cardiovascular status: stable Postop Assessment: no apparent nausea or vomiting Anesthetic complications: no     Last Vitals:  Vitals:   07/15/19 1300 07/15/19 1307  BP: 127/83 129/82  Pulse: 87 84  Resp: 19 16  Temp:  36.4 C  SpO2: 98% 95%    Last Pain:  Vitals:   07/15/19 1307  TempSrc: Axillary  PainSc: 0-No pain                 Katrina Brosh

## 2019-07-15 NOTE — Anesthesia Preprocedure Evaluation (Signed)
Anesthesia Evaluation  Patient identified by MRN, date of birth, ID band Patient awake    Reviewed: Allergy & Precautions, NPO status , Patient's Chart, lab work & pertinent test results, reviewed documented beta blocker date and time   Airway Mallampati: III  TM Distance: >3 FB Neck ROM: Full    Dental  (+) Upper Dentures, Missing   Pulmonary former smoker,    breath sounds clear to auscultation       Cardiovascular Exercise Tolerance: Good hypertension, Pt. on home beta blockers and Pt. on medications + angina + CAD, + Cardiac Stents (07/2017) and +CHF  + dysrhythmias  Rhythm:Regular Rate:Normal  13-Jul-2019 09:20:19 [facility]-AP-300 ROUTINE RECORD Normal sinus rhythm Right bundle branch block Left posterior fascicular block, Bifascicular block  Abnormal ECG Echo - 10/13/18 1. The left ventricle has normal systolic function with an ejection  fraction of 60-65%. The cavity size was normal. Left ventricular diastolic  Doppler parameters are consistent with impaired relaxation. No evidence of  left ventricular regional wall  motion abnormalities.  2. The right ventricle has normal systolic function. The cavity was  normal. There is no increase in right ventricular wall thickness.  3. The aortic valve is tricuspid. Mild aortic annular calcification  noted.  4. The mitral valve is grossly normal. There is mild mitral annular  calcification present.  5. The tricuspid valve is grossly normal.  6. The aortic root is normal in size and structure.    Neuro/Psych    GI/Hepatic GERD  Medicated,  Endo/Other  diabetes, Well Controlled, Type 2, Oral Hypoglycemic Agents  Renal/GU      Musculoskeletal  (+) Arthritis ,   Abdominal   Peds  Hematology   Anesthesia Other Findings   Reproductive/Obstetrics                            Anesthesia Physical Anesthesia Plan  ASA:  III  Anesthesia Plan: General   Post-op Pain Management:    Induction: Intravenous  PONV Risk Score and Plan: 2 and TIVA  Airway Management Planned: Nasal Cannula and Natural Airway  Additional Equipment:   Intra-op Plan:   Post-operative Plan:   Informed Consent: I have reviewed the patients History and Physical, chart, labs and discussed the procedure including the risks, benefits and alternatives for the proposed anesthesia with the patient or authorized representative who has indicated his/her understanding and acceptance.     Dental advisory given  Plan Discussed with: CRNA and Surgeon  Anesthesia Plan Comments:        Anesthesia Quick Evaluation

## 2019-07-15 NOTE — Anesthesia Procedure Notes (Signed)
Date/Time: 07/15/2019 12:17 PM Performed by: Vista Deck, CRNA Pre-anesthesia Checklist: Patient identified, Emergency Drugs available, Suction available, Timeout performed and Patient being monitored Patient Re-evaluated:Patient Re-evaluated prior to induction Oxygen Delivery Method: Non-rebreather mask

## 2019-07-15 NOTE — Transfer of Care (Signed)
Immediate Anesthesia Transfer of Care Note  Patient: ANANIA Faulkner  Procedure(s) Performed: COLONOSCOPY WITH PROPOFOL (N/A )  Patient Location: PACU  Anesthesia Type:General  Level of Consciousness: awake, alert  and patient cooperative  Airway & Oxygen Therapy: Patient Spontanous Breathing  Post-op Assessment: Report given to RN and Post -op Vital signs reviewed and stable  Post vital signs: Reviewed and stable  Last Vitals:  Vitals Value Taken Time  BP    Temp 97.9   Pulse    Resp    SpO2      Last Pain:  Vitals:   07/15/19 1220  PainSc: 0-No pain         Complications: No apparent anesthesia complications97.

## 2019-07-15 NOTE — Discharge Instructions (Signed)
Resume usual medications including aspirin as before. Modified carb high-fiber diet No driving for 24 hours. Next colonoscopy in 5 years. Office visit in May as planned.  Colonoscopy, Adult, Care After This sheet gives you information about how to care for yourself after your procedure. Your doctor may also give you more specific instructions. If you have problems or questions, call your doctor. What can I expect after the procedure? After the procedure, it is common to have:  A small amount of blood in your poop (stool) for 24 hours.  Some gas.  Mild cramping or bloating in your belly (abdomen). Follow these instructions at home: Eating and drinking   Drink enough fluid to keep your pee (urine) pale yellow.  Follow instructions from your doctor about what you cannot eat or drink.  Return to your normal diet as told by your doctor. Avoid heavy or fried foods that are hard to digest. Activity  Rest as told by your doctor.  Do not sit for a long time without moving. Get up to take short walks every 1-2 hours. This is important. Ask for help if you feel weak or unsteady.  Return to your normal activities as told by your doctor. Ask your doctor what activities are safe for you. To help cramping and bloating:   Try walking around.  Put heat on your belly as told by your doctor. Use the heat source that your doctor recommends, such as a moist heat pack or a heating pad. ? Put a towel between your skin and the heat source. ? Leave the heat on for 20-30 minutes. ? Remove the heat if your skin turns bright red. This is very important if you are unable to feel pain, heat, or cold. You may have a greater risk of getting burned. General instructions  For the first 24 hours after the procedure: ? Do not drive or use machinery. ? Do not sign important documents. ? Do not drink alcohol. ? Do your daily activities more slowly than normal. ? Eat foods that are soft and easy to  digest.  Take over-the-counter or prescription medicines only as told by your doctor.  Keep all follow-up visits as told by your doctor. This is important. Contact a doctor if:  You have blood in your poop 2-3 days after the procedure. Get help right away if:  You have more than a small amount of blood in your poop.  You see large clumps of tissue (blood clots) in your poop.  Your belly is swollen.  You feel like you may vomit (nauseous).  You vomit.  You have a fever.  You have belly pain that gets worse, and medicine does not help your pain. Summary  After the procedure, it is common to have a small amount of blood in your poop. You may also have mild cramping and bloating in your belly.  For the first 24 hours after the procedure, do not drive or use machinery, do not sign important documents, and do not drink alcohol.  Get help right away if you have a lot of blood in your poop, feel like you may vomit, have a fever, or have more belly pain. This information is not intended to replace advice given to you by your health care provider. Make sure you discuss any questions you have with your health care provider. Document Revised: 12/13/2018 Document Reviewed: 12/13/2018 Elsevier Patient Education  [employer].   Diverticulosis  Diverticulosis is a condition that develops when small  pouches (diverticula) form in the wall of the large intestine (colon). The colon is where water is absorbed and stool (feces) is formed. The pouches form when the inside layer of the colon pushes through weak spots in the outer layers of the colon. You may have a few pouches or many of them. The pouches usually do not cause problems unless they become inflamed or infected. When this happens, the condition is called diverticulitis. What are the causes? The cause of this condition is not known. What increases the risk? The following factors may make you more likely to develop this  condition: Being older than age 46. Your risk for this condition increases with age. Diverticulosis is rare among people younger than age 45. By age 33, many people have it. Eating a low-fiber diet. Having frequent constipation. Being overweight. Not getting enough exercise. Smoking. Taking over-the-counter pain medicines, like aspirin and ibuprofen. Having a family history of diverticulosis. What are the signs or symptoms? In most people, there are no symptoms of this condition. If you do have symptoms, they may include: Bloating. Cramps in the abdomen. Constipation or diarrhea. Pain in the lower left side of the abdomen. How is this diagnosed? Because diverticulosis usually has no symptoms, it is most often diagnosed during an exam for other colon problems. The condition may be diagnosed by: Using a flexible scope to examine the colon (colonoscopy). Taking an X-ray of the colon after dye has been put into the colon (barium enema). Having a CT scan. How is this treated? You may not need treatment for this condition. Your health care provider may recommend treatment to prevent problems. You may need treatment if you have symptoms or if you previously had diverticulitis. Treatment may include: Eating a high-fiber diet. Taking a fiber supplement. Taking a live bacteria supplement (probiotic). Taking medicine to relax your colon. Follow these instructions at home: Medicines Take over-the-counter and prescription medicines only as told by your health care provider. If told by your health care provider, take a fiber supplement or probiotic. Constipation prevention Your condition may cause constipation. To prevent or treat constipation, you may need to: Drink enough fluid to keep your urine pale yellow. Take over-the-counter or prescription medicines. Eat foods that are high in fiber, such as beans, whole grains, and fresh fruits and vegetables. Limit foods that are high in fat and  processed sugars, such as fried or sweet foods.  General instructions Try not to strain when you have a bowel movement. Keep all follow-up visits as told by your health care provider. This is important. Contact a health care provider if you: Have pain in your abdomen. Have bloating. Have cramps. Have not had a bowel movement in 3 days. Get help right away if: Your pain gets worse. Your bloating becomes very bad. You have a fever or chills, and your symptoms suddenly get worse. You vomit. You have bowel movements that are bloody or black. You have bleeding from your rectum. Summary Diverticulosis is a condition that develops when small pouches (diverticula) form in the wall of the large intestine (colon). You may have a few pouches or many of them. This condition is most often diagnosed during an exam for other colon problems. Treatment may include increasing the fiber in your diet, taking supplements, or taking medicines. This information is not intended to replace advice given to you by your health care provider. Make sure you discuss any questions you have with your health care provider. Document Revised: 12/16/2018 Document Reviewed:  12/16/2018 Elsevier Patient Education  [place].

## 2019-07-15 NOTE — Interval H&P Note (Signed)
Patient reports no new symptoms.  He is taking Linzess every other day.  It is working well.  He did not pass any blood when he took prep.  He states his co-pay for Linzess was $250 a month. Since examination is unchanged.  Heart exam with regular rhythm normal S1 and S2.  No murmur gallop noted.  Lungs are clear to auscultation.   He has long midline abdominal scar.  Abdomen is soft and nontender with organomegaly or masses History and Physical Interval Note:  07/15/2019 12:16 PM  Ronald Faulkner  has presented today for surgery, with the diagnosis of constipation, history diverticulitis, history polyps.  The various methods of treatment have been discussed with the patient and family. After consideration of risks, benefits and other options for treatment, the patient has consented to  Procedure(s) with comments: COLONOSCOPY WITH PROPOFOL (N/A) - 1235 as a surgical intervention.  The patient's history has been reviewed, patient examined, no change in status, stable for surgery.  I have reviewed the patient's chart and labs.  Questions were answered to the patient's satisfaction.     [employer]

## 2019-07-15 NOTE — Op Note (Signed)
[facility] Patient Name: Ronald Faulkner Procedure Date: 07/15/2019 12:16 PM MRN: [id number] Date of Birth: Jan 31, 1951 Attending MD: Hildred Laser , MD CSN: [id number] Age: 69 Admit Type: Outpatient Procedure:                Colonoscopy Indications:              High risk colon cancer surveillance: Personal                            history of colonic polyps; history of CRC in mother. Providers:                Hildred Laser, MD, Jeanann Lewandowsky. Sharon Seller, RN, Randa Spike, Technician Referring MD:             Adaline Sill, NP Medicines:                Propofol per Anesthesia Complications:            No immediate complications. Estimated Blood Loss:     Estimated blood loss: none. Procedure:                Pre-Anesthesia Assessment:                           - Prior to the procedure, a History and Physical                            was performed, and patient medications and                            allergies were reviewed. The patient's tolerance of                            previous anesthesia was also reviewed. The risks                            and benefits of the procedure and the sedation                            options and risks were discussed with the patient.                            All questions were answered, and informed consent                            was obtained. Prior Anticoagulants: The patient has                            taken no previous anticoagulant or antiplatelet                            agents except for aspirin. ASA Grade Assessment:  III - A patient with severe systemic disease. After                            reviewing the risks and benefits, the patient was                            deemed in satisfactory condition to undergo the                            procedure.                           After obtaining informed consent, the colonoscope                            was passed  under direct vision. Throughout the                            procedure, the patient's blood pressure, pulse, and                            oxygen saturations were monitored continuously. The                            [id number]-[id number] scope was introduced through                            the anus and advanced to the the cecum, identified                            by appendiceal orifice and ileocecal valve. The                            colonoscopy was performed without difficulty. The                            patient tolerated the procedure well. The quality                            of the bowel preparation was adequate. Scope In: 12:24:28 PM Scope Out: 12:35:12 PM Scope Withdrawal Time: 0 hours 8 minutes 22 seconds  Total Procedure Duration: 0 hours 10 minutes 44 seconds  Findings:      The perianal and digital rectal examinations were normal.      Scattered diverticula were found in the sigmoid colon, hepatic flexure       and ascending colon.      There was evidence of a prior end-to-end colo-colonic anastomosis at 18       cm proximal to the anus. This was patent.      External hemorrhoids were found during retroflexion. The hemorrhoids       were small. Impression:               - Diverticulosis in the sigmoid colon, at the  hepatic flexure and in the ascending colon.                           - Patent end-to-end colo-colonic anastomosis.                           - External hemorrhoids.                           - No specimens collected. Moderate Sedation:      Per Anesthesia Care Recommendation:           - Patient has a contact number available for                            emergencies. The signs and symptoms of potential                            delayed complications were discussed with the                            patient. Return to normal activities tomorrow.                            Written discharge instructions were  provided to the                            patient.                           - High fiber diet and diabetic (ADA) diet today.                           - Continue present medications.                           - Repeat colonoscopy in 5 years for surveillance. Procedure Code(s):        --- Professional ---                           [phone], Colonoscopy, flexible; diagnostic, including                            collection of specimen(s) by brushing or washing,                            when performed (separate procedure) Diagnosis Code(s):        --- Professional ---                           Z86.010, Personal history of colonic polyps                           K64.4, Residual hemorrhoidal skin tags                           Z98.0, Intestinal bypass  and anastomosis status                           K57.30, Diverticulosis of large intestine without                            perforation or abscess without bleeding CPT copyright 2019 American Medical Association. All rights reserved. The codes documented in this report are preliminary and upon coder review may  be revised to meet current compliance requirements. Hildred Laser, MD Hildred Laser, MD 07/15/2019 12:45:54 PM This report has been signed electronically. Number of Addenda: 0

## 2019-10-11 ENCOUNTER — Telehealth: Payer: Self-pay | Admitting: Cardiovascular Disease

## 2019-10-11 NOTE — Telephone Encounter (Signed)
Fwd to pharm for advice.  

## 2019-10-11 NOTE — Telephone Encounter (Signed)
Plavix can be discontinued.

## 2019-10-11 NOTE — Telephone Encounter (Signed)
Fwd to provider for answer.

## 2019-10-11 NOTE — Telephone Encounter (Signed)
Patient walk in  Asking when should he start back his Plavix

## 2019-10-11 NOTE — Telephone Encounter (Signed)
Plavix decisions are for the MD to determine.  We can only comment on anticoagulants.

## 2019-10-12 NOTE — Telephone Encounter (Signed)
Patient informed and verbalized understanding of plan. 

## 2019-10-25 ENCOUNTER — Ambulatory Visit (INDEPENDENT_AMBULATORY_CARE_PROVIDER_SITE_OTHER): Payer: Medicare HMO | Admitting: Internal Medicine

## 2019-10-25 ENCOUNTER — Encounter (INDEPENDENT_AMBULATORY_CARE_PROVIDER_SITE_OTHER): Payer: Self-pay | Admitting: *Deleted

## 2019-10-25 ENCOUNTER — Encounter (INDEPENDENT_AMBULATORY_CARE_PROVIDER_SITE_OTHER): Payer: Self-pay | Admitting: Internal Medicine

## 2019-10-25 ENCOUNTER — Other Ambulatory Visit: Payer: Self-pay

## 2019-10-25 DIAGNOSIS — R110 Nausea: Secondary | ICD-10-CM

## 2019-10-25 MED ORDER — PANTOPRAZOLE SODIUM 40 MG PO TBEC: 40.0000 mg | DELAYED_RELEASE_TABLET | Freq: Every day | ORAL | 5 refills | Status: DC

## 2019-10-25 NOTE — Progress Notes (Signed)
Presenting complaint;  Follow for constipation. Patient complains of nausea.  Database and subjective:  Patient is 69 year old Afro-American male who has history of sigmoid diverticulitis with removed bowel surgery who has been treated for diverticulitis.  He also has history of colonic polyps and his last colonoscopy was in in February 2021 revealing colonic diverticulosis but no evidence of polyps.  He has been maintained on Linzess/linaclotide. Today he comes in with new complaint.  He states he has had nausea for the last 3 to 5 months.  It was infrequent to begin with but lately he has been having anytime he eats what he says a regular meal.  It last for about an hour.  He feels like vomiting but never has.  This nausea is not associated with pain.  He he has lost 5 pounds in the last 3 months.  He does drink a lot of colas.  He has never been diagnosed with peptic ulcer disease.  He has intermittent nocturnal regurgitation but no heartburn.  He says his bowels are moving almost every day.  He may skip a day here and there.  He is taking Linzess every other day.  He is not having any side effects with this medication.  He also complains of dizzy spells.  He has had intermittent chest pain which only last for few seconds.  He has history of coronary artery disease.   Current Medications: Outpatient Encounter Medications as of 10/25/2019  Medication Sig  . allopurinol (ZYLOPRIM) 300 MG tablet Take 300 mg by mouth daily.   Marland Kitchen aspirin EC 81 MG tablet Take 81 mg by mouth daily.  . dapagliflozin propanediol (FARXIGA) 10 MG TABS tablet Take 10 mg by mouth daily.  . ferrous sulfate 324 (65 Fe) MG TBEC Take by mouth. Patient states that he takes onr by mouth every other day.  . hydrochlorothiazide (HYDRODIURIL) 25 MG tablet Take 25 mg by mouth daily.   . isosorbide mononitrate (IMDUR) 60 MG 24 hr tablet Take 1 tablet (60 mg total) by mouth daily.  Marland Kitchen linaclotide (LINZESS) 72 MCG capsule Take 1 capsule  (72 mcg total) by mouth daily before breakfast.  . losartan (COZAAR) 100 MG tablet Take 100 mg by mouth daily.  . metFORMIN (GLUCOPHAGE) 500 MG tablet Take 1 tablet (500 mg total) by mouth 2 (two) times daily with a meal.  . metoprolol succinate (TOPROL-XL) 25 MG 24 hr tablet TAKE 1 & 1/2 (ONE & ONE-HALF) TABLETS BY MOUTH ONCE DAILY (Patient taking differently: Take 37.5 mg by mouth daily. )  . montelukast (SINGULAIR) 10 MG tablet Take 10 mg by mouth at bedtime.  . nitroGLYCERIN (NITROSTAT) 0.4 MG SL tablet DISSOLVE ONE TABLET UNDER THE TONGUE EVERY 5 MINUTES AS NEEDED FOR CHEST PAIN.  DO NOT EXCEED A TOTAL OF 3 DOSES IN 15 MINUTES (Patient taking differently: Place 0.4 mg under the tongue every 5 (five) minutes as needed for chest pain. )  . oxymetazoline (12 HOUR NASAL SPRAY) 0.05 % nasal spray Place 1 spray into both nostrils 2 (two) times daily as needed for congestion.   . rosuvastatin (CRESTOR) 5 MG tablet TAKE 1 TABLET BY MOUTH ONCE DAILY ON  MONDAY,  WEDNESDAY,  AND  FRIDAY  ONLY  STOPPING  LIPITOR (Patient taking differently: Take 5 mg by mouth every Monday, Wednesday, and Friday. )  . famotidine (PEPCID) 20 MG tablet Take 1 tablet (20 mg total) by mouth 2 (two) times daily as needed for heartburn or indigestion. (Patient not taking:  Reported on 10/25/2019)  . psyllium (METAMUCIL SMOOTH TEXTURE) 58.6 % powder Take 1 packet by mouth daily. (Patient not taking: Reported on 10/25/2019)   No facility-administered encounter medications on file as of 10/25/2019.     Objective: Blood pressure 96/63, pulse 91, temperature (!) 96.9 F (36.1 C), temperature source Temporal, height 5' 7.1" (1.704 m), weight 165 lb (74.8 kg). Patient is alert and in no acute distress. Conjunctiva is pink. Sclera is nonicteric Oropharyngeal mucosa is normal. No neck masses or thyromegaly noted. Cardiac exam with regular rhythm normal S1 and S2. No murmur or gallop noted. Lungs are clear to auscultation. Abdomen is  symmetrical.  He has long midline scar.  On palpation abdomen is soft and nontender with organomegaly or masses.  He has small umbilical hernia which is reducible and nontender and he also has hernia of the linea alba. No LE edema or clubbing noted.   Assessment:  #1.  Chronic constipation.  He is doing well with dietary measures and Linzess/linaclotide every other day.  He will continue this medication as long as is working and he has no side effects.  #2.  Postprandial nausea of 3 to 5 months duration.  He experience no abdominal pain.  Heartburn is well controlled with therapy.  He may well have cholelithiasis.  #3.  History of feeding chest pain and dizziness.  Patient has history of coronary artery disease.  Patient advised to contact Dr. Bronson Ing office for appointment as soon as possible.  If he has prolonged chest pain he should just report to emergency room.  Plan:  Begin pantoprazole 40 mg by mouth 30 minutes before breakfast. Abdominal ultrasound to be scheduled. Patient advised to arrange for cardiology office visit because of his chest symptoms. Office visit on as-needed basis.

## 2019-10-25 NOTE — Patient Instructions (Signed)
Physician will call with results of ulrasound

## 2019-10-26 ENCOUNTER — Encounter: Payer: Self-pay | Admitting: *Deleted

## 2019-10-26 ENCOUNTER — Ambulatory Visit: Payer: Medicare HMO | Admitting: Cardiovascular Disease

## 2019-10-26 ENCOUNTER — Other Ambulatory Visit: Payer: Self-pay | Admitting: Cardiovascular Disease

## 2019-10-26 ENCOUNTER — Encounter: Payer: Self-pay | Admitting: Cardiovascular Disease

## 2019-10-26 VITALS — BP 84/56 | HR 103 | Ht 67.0 in | Wt 160.2 lb

## 2019-10-26 DIAGNOSIS — E785 Hyperlipidemia, unspecified: Secondary | ICD-10-CM | POA: Diagnosis not present

## 2019-10-26 DIAGNOSIS — I25110 Atherosclerotic heart disease of native coronary artery with unstable angina pectoris: Secondary | ICD-10-CM | POA: Diagnosis not present

## 2019-10-26 DIAGNOSIS — I200 Unstable angina: Secondary | ICD-10-CM | POA: Diagnosis not present

## 2019-10-26 DIAGNOSIS — I10 Essential (primary) hypertension: Secondary | ICD-10-CM

## 2019-10-26 DIAGNOSIS — Z955 Presence of coronary angioplasty implant and graft: Secondary | ICD-10-CM

## 2019-10-26 DIAGNOSIS — Z01812 Encounter for preprocedural laboratory examination: Secondary | ICD-10-CM

## 2019-10-26 MED ORDER — PREDNISONE 50 MG PO TABS: 50.0000 mg | ORAL_TABLET | ORAL | 0 refills | Status: DC | PRN

## 2019-10-26 MED ORDER — SODIUM CHLORIDE 0.9% FLUSH: 3.0000 mL | Freq: Two times a day (BID) | INTRAVENOUS | Status: DC

## 2019-10-26 MED ORDER — LOSARTAN POTASSIUM 50 MG PO TABS: 50.0000 mg | ORAL_TABLET | Freq: Every day | ORAL | 6 refills | Status: DC

## 2019-10-26 MED ORDER — DIPHENHYDRAMINE HCL 50 MG PO TABS: 50.0000 mg | ORAL_TABLET | ORAL | 0 refills | Status: DC | PRN

## 2019-10-26 NOTE — Progress Notes (Signed)
SUBJECTIVE: Ronald Faulkner is a 69 y.o. male with coronary artery disease.  In summary, he has a history of coronary artery disease and underwent drug-eluting stent placement to the mid and distal LAD on 07/31/2017. He had moderately severe stenosis in a small caliber intermediate branch and medical management was recommended. There was moderate stenosis in the distal RCA, 50%.  Additional past medical history includes hypertension and type 2 diabetes mellitus.  I personally reviewed the ECG performed today which demonstrate sinus rhythm with right bundle branch block.  There are no significant differences from the ECG performed on 07/13/2019 which I also personally reviewed.  He was seen at the gastroenterology office yesterday and told Dr. Laural Golden about chest pain going on for the past month.  His blood pressure yesterday was 96/63.  He checked himself last night and it was 100/68.  For the past month he has been having episodic chest pains lasting for 5 seconds.  They occur at rest.  Sometimes they occur every day and sometimes once every 2 or 3 days.  He has become markedly short of breath with exertion.  When he walks 20 to 30 feet from his kitchen to the living room he becomes short of breath.  He also becomes short of breath when climbing a flight of stairs.  When he bends over he feels dizzy.  He has used nitroglycerin on 2 occasions.   Review of Systems: As per "subjective", otherwise negative.  Allergies  Allergen Reactions  . Bee Venom   . Fish Allergy Hives and Swelling  . Ivp Dye [Iodinated Diagnostic Agents] Swelling    Current Outpatient Medications  Medication Sig Dispense Refill  . allopurinol (ZYLOPRIM) 300 MG tablet Take 300 mg by mouth daily.     Marland Kitchen aspirin EC 81 MG tablet Take 81 mg by mouth daily.    . dapagliflozin propanediol (FARXIGA) 10 MG TABS tablet Take 10 mg by mouth daily.    . ferrous sulfate 324 (65 Fe) MG TBEC Take by mouth. Patient states that  he takes onr by mouth every other day.    . hydrochlorothiazide (HYDRODIURIL) 25 MG tablet Take 25 mg by mouth daily.     . isosorbide mononitrate (IMDUR) 60 MG 24 hr tablet Take 1 tablet (60 mg total) by mouth daily. 90 tablet 3  . linaclotide (LINZESS) 72 MCG capsule Take 72 mcg by mouth every other day.    . losartan (COZAAR) 100 MG tablet Take 100 mg by mouth daily.    . metFORMIN (GLUCOPHAGE) 500 MG tablet Take 1 tablet (500 mg total) by mouth 2 (two) times daily with a meal.    . metoprolol succinate (TOPROL-XL) 25 MG 24 hr tablet TAKE 1 & 1/2 (ONE & ONE-HALF) TABLETS BY MOUTH ONCE DAILY (Patient taking differently: Take 37.5 mg by mouth daily. ) 135 tablet 0  . montelukast (SINGULAIR) 10 MG tablet Take 10 mg by mouth at bedtime.    . nitroGLYCERIN (NITROSTAT) 0.4 MG SL tablet DISSOLVE ONE TABLET UNDER THE TONGUE EVERY 5 MINUTES AS NEEDED FOR CHEST PAIN.  DO NOT EXCEED A TOTAL OF 3 DOSES IN 15 MINUTES (Patient taking differently: Place 0.4 mg under the tongue every 5 (five) minutes as needed for chest pain. ) 25 tablet 1  . oxymetazoline (12 HOUR NASAL SPRAY) 0.05 % nasal spray Place 1 spray into both nostrils 2 (two) times daily as needed for congestion.     . pantoprazole (PROTONIX) 40 MG  tablet Take 1 tablet (40 mg total) by mouth daily before breakfast. 30 tablet 5  . rosuvastatin (CRESTOR) 5 MG tablet TAKE 1 TABLET BY MOUTH ONCE DAILY ON  MONDAY,  WEDNESDAY,  AND  FRIDAY  ONLY  STOPPING  LIPITOR (Patient taking differently: Take 5 mg by mouth every Monday, Wednesday, and Friday. ) 36 tablet 3   No current facility-administered medications for this visit.    Past Medical History:  Diagnosis Date  . Arthritis   . CAD (coronary artery disease)    a. s/p DES to mid and distal LAD in 07/2017 with medical management recommended of intermediate branch  . Cancer [facility])    prostate  . CHF (congestive heart failure) ([place])   . Colon polyps   . Diabetes mellitus without complication ([place])   .  Diverticulitis   . History of kidney stones   . Hypertension   . IBS (irritable bowel syndrome)   . 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 Unstable angina ([place]) 07/2017    Past Surgical History:  Procedure Laterality Date  . ABDOMINAL SURGERY    . APPENDECTOMY    . BREAST SURGERY     benign lump at lt side  . COLON SURGERY    . COLONOSCOPY  03/14/2011   Procedure: COLONOSCOPY;  Surgeon: [name], MD;  [facility];  Service: Endoscopy;  Laterality: N/A;  1:00  . COLONOSCOPY N/A 11/15/2015   Procedure: COLONOSCOPY;  Surgeon: [name], MD;  [facility];  Service: Endoscopy;  Laterality: N/A;  11:15  . COLONOSCOPY WITH PROPOFOL N/A 07/15/2019   Procedure: COLONOSCOPY WITH PROPOFOL;  Surgeon: [name], MD;  [facility];  Service: Endoscopy;  Laterality: N/A;  1235  . CORONARY STENT INTERVENTION N/A 07/31/2017   Procedure: CORONARY STENT INTERVENTION;  Surgeon: [name], MD;  Location: [place] [facility];  Service: Cardiovascular;  Laterality: N/A;  . LEFT HEART CATH AND CORONARY ANGIOGRAPHY N/A 07/31/2017   Procedure: LEFT HEART CATH AND CORONARY ANGIOGRAPHY;  Surgeon: [name], MD;  Location: [place] [facility];  Service: Cardiovascular;  Laterality: N/A;  . PENILE PROSTHESIS IMPLANT     2016   . prostate cancer     2015. prostectomy    Social History   Socioeconomic History  . Marital status: Married    Spouse name: Not on file  . Number of children: 2  . Years of education: Not on file  . Highest education level: Not on file  Occupational History    Employer: [employer]  Tobacco Use  . Smoking status: Former Smoker    Packs/day: 1.00    Years: 35.00    Pack years: 35.00    Types: Cigarettes    Quit date: 06/03/1999    Years since quitting: 20.4  . Smokeless tobacco: Never Used  Substance and Sexual Activity  . Alcohol use: Yes    Comment: occ  .  Drug use: No    Comment: hx of use in his colege days  . Sexual activity: Yes  Other Topics Concern  . Not on file  Social History Narrative   Lives at home with wife and daughter and her three children.     Social Determinants of Health   Financial Resource Strain:   . Difficulty of Paying Living Expenses:   Food Insecurity:   . Worried About Charity fundraiser in the Last  Year:   . Ran Out of Food in the Last Year:   Transportation Needs:   . Film/video editor (Medical):   Marland Kitchen Lack of Transportation (Non-Medical):   Physical Activity:   . Days of Exercise per Week:   . Minutes of Exercise per Session:   Stress:   . Feeling of Stress :   Social Connections:   . Frequency of Communication with Friends and Family:   . Frequency of Social Gatherings with Friends and Family:   . Attends Religious Services:   . Active Member of Clubs or Organizations:   . Attends Archivist Meetings:   Marland Kitchen Marital Status:   Intimate Partner Violence:   . Fear of Current or Ex-Partner:   . Emotionally Abused:   Marland Kitchen Physically Abused:   . Sexually Abused:      Vitals:   10/26/19 1421  BP: (!) 84/56  Pulse: (!) 103  SpO2: 93%  Weight: 160 lb 3.2 oz (72.7 kg)  Height: 5\' 7"  (1.702 m)    Wt Readings from Last 3 Encounters:  10/26/19 160 lb 3.2 oz (72.7 kg)  10/25/19 165 lb (74.8 kg)  07/13/19 160 lb (72.6 kg)     PHYSICAL EXAM General: NAD HEENT: Normal. Neck: No JVD, no thyromegaly. Lungs: Clear to auscultation bilaterally with normal respiratory effort. CV: Regular rate and rhythm, normal S1/S2, no S3/S4, no murmur. No pretibial or periankle edema.  No carotid bruit.   Abdomen: Soft, nontender, no distention.  Neurologic: Alert and oriented.  Psych: Normal affect. Skin: Normal. Musculoskeletal: No gross deformities.      Labs: Lab Results  Component Value Date/Time   K 4.7 07/13/2019 09:07 AM   BUN 20 07/13/2019 09:07 AM   CREATININE 1.03 07/13/2019 09:07  AM   ALT 34 06/28/2018 11:47 AM   HGB 11.5 (L) 07/01/2018 09:50 AM     Lipids: Lab Results  Component Value Date/Time   LDLCALC 99 05/27/2017 08:44 AM   CHOL 195 05/27/2017 08:44 AM   TRIG 253 (H) 05/27/2017 08:44 AM   HDL 45 05/27/2017 08:44 AM      Prior CV studies:   The following studies were reviewed today:  Echocardiogram: 10/13/18  Study Conclusions  1. The left ventricle has normal systolic function with an ejection fraction of 60-65%. The cavity size was normal. Left ventricular diastolic Doppler parameters are consistent with impaired relaxation. No evidence of left ventricular regional wall  motion abnormalities. 2. The right ventricle has normal systolic function. The cavity was normal. There is no increase in right ventricular wall thickness. 3. The aortic valve is tricuspid. Mild aortic annular calcification noted. 4. The mitral valve is grossly normal. There is mild mitral annular calcification present. 5. The tricuspid valve is grossly normal. 6. The aortic root is normal in size and structure.  Cardiac Catheterization: 07/2017  Prox RCA to Mid RCA lesion is 20% stenosed.  Dist RCA lesion is 50% stenosed.  Dist Cx lesion is 60% stenosed.  Ost Cx to Prox Cx lesion is 20% stenosed.  Ost 1st Mrg to 1st Mrg lesion is 70% stenosed.  Ost LAD to Prox LAD lesion is 20% stenosed.  Mid LAD-1 lesion is 80% stenosed.  Mid LAD-2 lesion is 95% stenosed.  Dist LAD lesion is 70% stenosed.  A drug-eluting stent was successfully placed using a STENT SIERRA 2.25 X 12 MM.  Post intervention, there is a 0% residual stenosis.  A drug-eluting stent was successfully placed using a STENT SIERRA  2.50 X 18 MM.  Post intervention, there is a 0% residual stenosis.  1. Severe stenosis in the mid and distal LAD. Successful PTCA/DES x 1 distal LAD and PTCA/DES x 1 mid LAD.  2. Modeately severe stenosis in small caliber intermediate branch.  3. Moderate stenosis  in the distal RCA  Recommendations: Will continue DAPT with ASA and Plavix for at least one year. Will continue beta blocker and statin. I would recommend medical management of the disease in the small caliber intermediate branch.    ASSESSMENT AND PLAN:  1. Coronary artery disease/accelerating angina: Symptoms are concerning for accelerating angina.  I will arrange for coronary angiography this week.  I informed him should he have unrelenting chest pain not alleviated with nitroglycerin that he should call 911. Coronary angiography details from March 2019 reviewed above with mid and distal LAD stent placement in March 2019. Currently taking ASA, Toprol-XL, Imdur 60 mg, and statin.  Increasing the dose of Imdur to 60 mg did not alleviate chest pains. He did not tolerate atorvastatin 40 mg dailyand is taking Crestor 3 days a week along with Zetia.  2. Hypertension: He is hypotensive.  I will stop hydrochlorothiazide and reduce losartan to 50 mg.  See discussion in #1.  3. Hyperlipidemia:LDL132 on 03/24/2018. He did not tolerate atorvastatin 40 mg daily.He takes Crestor 5 mg every Monday, Wednesday, and Friday. He is no longer on Zetia. He does have some myalgias but they are not as severe as when he took Lipitor. I will obtain a copy of lipids from PCP.    Disposition: Follow up after cath  A high level of decision making was required for increased medical complexities.    Kate Sable, M.D., F.A.C.C.

## 2019-10-26 NOTE — Patient Instructions (Signed)
Medication Instructions:   Stop HCTZ.   Decrease Losartan to 50mg  daily.   Continue all other medications.    Labwork: BMET, CBC - order given today   Testing/Procedures: Your physician has requested that you have a cardiac catheterization. Cardiac catheterization is used to diagnose and/or treat various heart conditions. Doctors may recommend this procedure for a number of different reasons. The most common reason is to evaluate chest pain. Chest pain can be a symptom of coronary artery disease (CAD), and cardiac catheterization can show whether plaque is narrowing or blocking your heart's arteries. This procedure is also used to evaluate the valves, as well as measure the blood flow and oxygen levels in different parts of your heart. For further information please visit [email or web address]. Please follow instruction sheet, as given.  Follow-Up: 1 month   Any Other Special Instructions Will Be Listed Below (If Applicable).  If you need a refill on your cardiac medications before your next appointment, please call your pharmacy.

## 2019-10-26 NOTE — Addendum Note (Signed)
Addended by: Laurine Blazer on: 10/26/2019 03:13 PM   Modules accepted: Orders

## 2019-10-26 NOTE — H&P (View-Only) (Signed)
SUBJECTIVE: Ronald Faulkner is a 69 y.o. male with coronary artery disease.  In summary, he has a history of coronary artery disease and underwent drug-eluting stent placement to the mid and distal LAD on 07/31/2017. He had moderately severe stenosis in a small caliber intermediate branch and medical management was recommended. There was moderate stenosis in the distal RCA, 50%.  Additional past medical history includes hypertension and type 2 diabetes mellitus.  I personally reviewed the ECG performed today which demonstrate sinus rhythm with right bundle branch block.  There are no significant differences from the ECG performed on 07/13/2019 which I also personally reviewed.  He was seen at the gastroenterology office yesterday and told Dr. Laural Golden about chest pain going on for the past month.  His blood pressure yesterday was 96/63.  He checked himself last night and it was 100/68.  For the past month he has been having episodic chest pains lasting for 5 seconds.  They occur at rest.  Sometimes they occur every day and sometimes once every 2 or 3 days.  He has become markedly short of breath with exertion.  When he walks 20 to 30 feet from his kitchen to the living room he becomes short of breath.  He also becomes short of breath when climbing a flight of stairs.  When he bends over he feels dizzy.  He has used nitroglycerin on 2 occasions.   Review of Systems: As per "subjective", otherwise negative.  Allergies  Allergen Reactions  . Bee Venom   . Fish Allergy Hives and Swelling  . Ivp Dye [Iodinated Diagnostic Agents] Swelling    Current Outpatient Medications  Medication Sig Dispense Refill  . allopurinol (ZYLOPRIM) 300 MG tablet Take 300 mg by mouth daily.     Marland Kitchen aspirin EC 81 MG tablet Take 81 mg by mouth daily.    . dapagliflozin propanediol (FARXIGA) 10 MG TABS tablet Take 10 mg by mouth daily.    . ferrous sulfate 324 (65 Fe) MG TBEC Take by mouth. Patient states that  he takes onr by mouth every other day.    . hydrochlorothiazide (HYDRODIURIL) 25 MG tablet Take 25 mg by mouth daily.     . isosorbide mononitrate (IMDUR) 60 MG 24 hr tablet Take 1 tablet (60 mg total) by mouth daily. 90 tablet 3  . linaclotide (LINZESS) 72 MCG capsule Take 72 mcg by mouth every other day.    . losartan (COZAAR) 100 MG tablet Take 100 mg by mouth daily.    . metFORMIN (GLUCOPHAGE) 500 MG tablet Take 1 tablet (500 mg total) by mouth 2 (two) times daily with a meal.    . metoprolol succinate (TOPROL-XL) 25 MG 24 hr tablet TAKE 1 & 1/2 (ONE & ONE-HALF) TABLETS BY MOUTH ONCE DAILY (Patient taking differently: Take 37.5 mg by mouth daily. ) 135 tablet 0  . montelukast (SINGULAIR) 10 MG tablet Take 10 mg by mouth at bedtime.    . nitroGLYCERIN (NITROSTAT) 0.4 MG SL tablet DISSOLVE ONE TABLET UNDER THE TONGUE EVERY 5 MINUTES AS NEEDED FOR CHEST PAIN.  DO NOT EXCEED A TOTAL OF 3 DOSES IN 15 MINUTES (Patient taking differently: Place 0.4 mg under the tongue every 5 (five) minutes as needed for chest pain. ) 25 tablet 1  . oxymetazoline (12 HOUR NASAL SPRAY) 0.05 % nasal spray Place 1 spray into both nostrils 2 (two) times daily as needed for congestion.     . pantoprazole (PROTONIX) 40 MG  tablet Take 1 tablet (40 mg total) by mouth daily before breakfast. 30 tablet 5  . rosuvastatin (CRESTOR) 5 MG tablet TAKE 1 TABLET BY MOUTH ONCE DAILY ON  MONDAY,  WEDNESDAY,  AND  FRIDAY  ONLY  STOPPING  LIPITOR (Patient taking differently: Take 5 mg by mouth every Monday, Wednesday, and Friday. ) 36 tablet 3   No current facility-administered medications for this visit.    Past Medical History:  Diagnosis Date  . Arthritis   . CAD (coronary artery disease)    a. s/p DES to mid and distal LAD in 07/2017 with medical management recommended of intermediate branch  . Cancer [facility])    prostate  . CHF (congestive heart failure) ([place])   . Colon polyps   . Diabetes mellitus without complication ([place])   .  Diverticulitis   . History of kidney stones   . Hypertension   . IBS (irritable bowel syndrome)   . 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 Unstable angina ([place]) 07/2017    Past Surgical History:  Procedure Laterality Date  . ABDOMINAL SURGERY    . APPENDECTOMY    . BREAST SURGERY     benign lump at lt side  . COLON SURGERY    . COLONOSCOPY  03/14/2011   Procedure: COLONOSCOPY;  Surgeon: [name], MD;  [facility];  Service: Endoscopy;  Laterality: N/A;  1:00  . COLONOSCOPY N/A 11/15/2015   Procedure: COLONOSCOPY;  Surgeon: [name], MD;  [facility];  Service: Endoscopy;  Laterality: N/A;  11:15  . COLONOSCOPY WITH PROPOFOL N/A 07/15/2019   Procedure: COLONOSCOPY WITH PROPOFOL;  Surgeon: [name], MD;  [facility];  Service: Endoscopy;  Laterality: N/A;  1235  . CORONARY STENT INTERVENTION N/A 07/31/2017   Procedure: CORONARY STENT INTERVENTION;  Surgeon: [name], MD;  Location: [place] [facility];  Service: Cardiovascular;  Laterality: N/A;  . LEFT HEART CATH AND CORONARY ANGIOGRAPHY N/A 07/31/2017   Procedure: LEFT HEART CATH AND CORONARY ANGIOGRAPHY;  Surgeon: [name], MD;  Location: [place] [facility];  Service: Cardiovascular;  Laterality: N/A;  . PENILE PROSTHESIS IMPLANT     2016   . prostate cancer     2015. prostectomy    Social History   Socioeconomic History  . Marital status: Married    Spouse name: Not on file  . Number of children: 2  . Years of education: Not on file  . Highest education level: Not on file  Occupational History    Employer: [employer]  Tobacco Use  . Smoking status: Former Smoker    Packs/day: 1.00    Years: 35.00    Pack years: 35.00    Types: Cigarettes    Quit date: 06/03/1999    Years since quitting: 20.4  . Smokeless tobacco: Never Used  Substance and Sexual Activity  . Alcohol use: Yes    Comment: occ  .  Drug use: No    Comment: hx of use in his colege days  . Sexual activity: Yes  Other Topics Concern  . Not on file  Social History Narrative   Lives at home with wife and daughter and her three children.     Social Determinants of Health   Financial Resource Strain:   . Difficulty of Paying Living Expenses:   Food Insecurity:   . Worried About Charity fundraiser in the Last  Year:   . Ran Out of Food in the Last Year:   Transportation Needs:   . Film/video editor (Medical):   Marland Kitchen Lack of Transportation (Non-Medical):   Physical Activity:   . Days of Exercise per Week:   . Minutes of Exercise per Session:   Stress:   . Feeling of Stress :   Social Connections:   . Frequency of Communication with Friends and Family:   . Frequency of Social Gatherings with Friends and Family:   . Attends Religious Services:   . Active Member of Clubs or Organizations:   . Attends Archivist Meetings:   Marland Kitchen Marital Status:   Intimate Partner Violence:   . Fear of Current or Ex-Partner:   . Emotionally Abused:   Marland Kitchen Physically Abused:   . Sexually Abused:      Vitals:   10/26/19 1421  BP: (!) 84/56  Pulse: (!) 103  SpO2: 93%  Weight: 160 lb 3.2 oz (72.7 kg)  Height: 5\' 7"  (1.702 m)    Wt Readings from Last 3 Encounters:  10/26/19 160 lb 3.2 oz (72.7 kg)  10/25/19 165 lb (74.8 kg)  07/13/19 160 lb (72.6 kg)     PHYSICAL EXAM General: NAD HEENT: Normal. Neck: No JVD, no thyromegaly. Lungs: Clear to auscultation bilaterally with normal respiratory effort. CV: Regular rate and rhythm, normal S1/S2, no S3/S4, no murmur. No pretibial or periankle edema.  No carotid bruit.   Abdomen: Soft, nontender, no distention.  Neurologic: Alert and oriented.  Psych: Normal affect. Skin: Normal. Musculoskeletal: No gross deformities.      Labs: Lab Results  Component Value Date/Time   K 4.7 07/13/2019 09:07 AM   BUN 20 07/13/2019 09:07 AM   CREATININE 1.03 07/13/2019 09:07  AM   ALT 34 06/28/2018 11:47 AM   HGB 11.5 (L) 07/01/2018 09:50 AM     Lipids: Lab Results  Component Value Date/Time   LDLCALC 99 05/27/2017 08:44 AM   CHOL 195 05/27/2017 08:44 AM   TRIG 253 (H) 05/27/2017 08:44 AM   HDL 45 05/27/2017 08:44 AM      Prior CV studies:   The following studies were reviewed today:  Echocardiogram: 10/13/18  Study Conclusions  1. The left ventricle has normal systolic function with an ejection fraction of 60-65%. The cavity size was normal. Left ventricular diastolic Doppler parameters are consistent with impaired relaxation. No evidence of left ventricular regional wall  motion abnormalities. 2. The right ventricle has normal systolic function. The cavity was normal. There is no increase in right ventricular wall thickness. 3. The aortic valve is tricuspid. Mild aortic annular calcification noted. 4. The mitral valve is grossly normal. There is mild mitral annular calcification present. 5. The tricuspid valve is grossly normal. 6. The aortic root is normal in size and structure.  Cardiac Catheterization: 07/2017  Prox RCA to Mid RCA lesion is 20% stenosed.  Dist RCA lesion is 50% stenosed.  Dist Cx lesion is 60% stenosed.  Ost Cx to Prox Cx lesion is 20% stenosed.  Ost 1st Mrg to 1st Mrg lesion is 70% stenosed.  Ost LAD to Prox LAD lesion is 20% stenosed.  Mid LAD-1 lesion is 80% stenosed.  Mid LAD-2 lesion is 95% stenosed.  Dist LAD lesion is 70% stenosed.  A drug-eluting stent was successfully placed using a STENT SIERRA 2.25 X 12 MM.  Post intervention, there is a 0% residual stenosis.  A drug-eluting stent was successfully placed using a STENT SIERRA  2.50 X 18 MM.  Post intervention, there is a 0% residual stenosis.  1. Severe stenosis in the mid and distal LAD. Successful PTCA/DES x 1 distal LAD and PTCA/DES x 1 mid LAD.  2. Modeately severe stenosis in small caliber intermediate branch.  3. Moderate stenosis  in the distal RCA  Recommendations: Will continue DAPT with ASA and Plavix for at least one year. Will continue beta blocker and statin. I would recommend medical management of the disease in the small caliber intermediate branch.    ASSESSMENT AND PLAN:  1. Coronary artery disease/accelerating angina: Symptoms are concerning for accelerating angina.  I will arrange for coronary angiography this week.  I informed him should he have unrelenting chest pain not alleviated with nitroglycerin that he should call 911. Coronary angiography details from March 2019 reviewed above with mid and distal LAD stent placement in March 2019. Currently taking ASA, Toprol-XL, Imdur 60 mg, and statin.  Increasing the dose of Imdur to 60 mg did not alleviate chest pains. He did not tolerate atorvastatin 40 mg dailyand is taking Crestor 3 days a week along with Zetia.  2. Hypertension: He is hypotensive.  I will stop hydrochlorothiazide and reduce losartan to 50 mg.  See discussion in #1.  3. Hyperlipidemia:LDL132 on 03/24/2018. He did not tolerate atorvastatin 40 mg daily.He takes Crestor 5 mg every Monday, Wednesday, and Friday. He is no longer on Zetia. He does have some myalgias but they are not as severe as when he took Lipitor. I will obtain a copy of lipids from PCP.    Disposition: Follow up after cath  A high level of decision making was required for increased medical complexities.    Kate Sable, M.D., F.A.C.C.

## 2019-10-27 ENCOUNTER — Telehealth: Payer: Self-pay | Admitting: *Deleted

## 2019-10-27 ENCOUNTER — Telehealth: Payer: Self-pay | Admitting: Cardiovascular Disease

## 2019-10-27 ENCOUNTER — Other Ambulatory Visit: Payer: Self-pay

## 2019-10-27 ENCOUNTER — Other Ambulatory Visit (HOSPITAL_COMMUNITY)
Admission: RE | Admit: 2019-10-27 | Discharge: 2019-10-27 | Disposition: A | Discharge status: Home / Self Care | Payer: Medicare HMO | Source: Ambulatory Visit | Attending: Cardiovascular Disease | Admitting: Cardiovascular Disease

## 2019-10-27 DIAGNOSIS — Z7982 Long term (current) use of aspirin: Secondary | ICD-10-CM | POA: Insufficient documentation

## 2019-10-27 DIAGNOSIS — E119 Type 2 diabetes mellitus without complications: Secondary | ICD-10-CM | POA: Diagnosis not present

## 2019-10-27 DIAGNOSIS — I110 Hypertensive heart disease with heart failure: Secondary | ICD-10-CM | POA: Insufficient documentation

## 2019-10-27 DIAGNOSIS — Z01812 Encounter for preprocedural laboratory examination: Secondary | ICD-10-CM | POA: Insufficient documentation

## 2019-10-27 DIAGNOSIS — Z7984 Long term (current) use of oral hypoglycemic drugs: Secondary | ICD-10-CM | POA: Diagnosis not present

## 2019-10-27 DIAGNOSIS — E785 Hyperlipidemia, unspecified: Secondary | ICD-10-CM | POA: Insufficient documentation

## 2019-10-27 DIAGNOSIS — I509 Heart failure, unspecified: Secondary | ICD-10-CM | POA: Diagnosis not present

## 2019-10-27 DIAGNOSIS — I4510 Unspecified right bundle-branch block: Secondary | ICD-10-CM | POA: Diagnosis not present

## 2019-10-27 DIAGNOSIS — Z79899 Other long term (current) drug therapy: Secondary | ICD-10-CM | POA: Diagnosis not present

## 2019-10-27 DIAGNOSIS — I2510 Atherosclerotic heart disease of native coronary artery without angina pectoris: Secondary | ICD-10-CM | POA: Diagnosis not present

## 2019-10-27 DIAGNOSIS — Z87891 Personal history of nicotine dependence: Secondary | ICD-10-CM | POA: Diagnosis not present

## 2019-10-27 LAB — CBC
HCT: 40.6 % (ref 39.0–52.0)
Hemoglobin: 12.7 g/dL — ABNORMAL LOW (ref 13.0–17.0)
MCH: 27.3 pg (ref 26.0–34.0)
MCHC: 31.3 g/dL (ref 30.0–36.0)
MCV: 87.1 fL (ref 80.0–100.0)
Platelets: 252 10*3/uL (ref 150–400)
RBC: 4.66 MIL/uL (ref 4.22–5.81)
RDW: 15.7 % — ABNORMAL HIGH (ref 11.5–15.5)
WBC: 6.3 10*3/uL (ref 4.0–10.5)
nRBC: 0 % (ref 0.0–0.2)

## 2019-10-27 LAB — BASIC METABOLIC PANEL
Anion gap: 12 (ref 5–15)
BUN: 27 mg/dL — ABNORMAL HIGH (ref 8–23)
CO2: 23 mmol/L (ref 22–32)
Calcium: 9.7 mg/dL (ref 8.9–10.3)
Chloride: 103 mmol/L (ref 98–111)
Creatinine, Ser: 1.53 mg/dL — ABNORMAL HIGH (ref 0.61–1.24)
GFR calc Af Amer: 53 mL/min — ABNORMAL LOW (ref >60)
GFR calc non Af Amer: 46 mL/min — ABNORMAL LOW (ref >60)
Glucose, Bld: 153 mg/dL — ABNORMAL HIGH (ref 70–99)
Potassium: 4.3 mmol/L (ref 3.5–5.1)
Sodium: 138 mmol/L (ref 135–145)

## 2019-10-27 NOTE — Telephone Encounter (Signed)
Pre-cert Verification for the following procedure     left heart cath - 5/58 - 10:30 - Fletcher Anon

## 2019-10-27 NOTE — Telephone Encounter (Signed)
Pt contacted pre-catheterization scheduled at [facility] for: Friday Oct 28, 2019 10:30 AM Verified arrival time and place: [facility]) at: 5:30 AM-pre procedure hydration  *see notes below  No solid food after midnight prior to cath, clear liquids until 5 AM day of procedure.  Contrast allergy: 13 hour Prednisone and Benadryl  Prep reviewed with patient: 10/27/19 Prednisone 50 mg 9:30 PM 10/28/19 Prednisone 50 mg 3:30 AM 10/28/19 Prednisone 50 mg and Benadryl 50 mg-I have asked pt to take with him to hospital and take at 8:30 AM at hospital AM of procedure. He will let Short Stay Staff know that I have asked him to do this.  Hold: Farxiga-AM of procedure Metformin-day of procedure and 48 hours post procedure. Losartan-AM of procedure GFR 53  Except hold medications AM meds can be  taken pre-cath with sip of water including: ASA 81 mg   Confirmed patient has responsible adult to drive home post procedure and observe 24 hours after arriving home: yes  You are allowed ONE visitor in the waiting room during your procedure. Both you and your visitor must wear masks.  ______ * Per Dr Bronson Ing Copied from [facility] results 10/27/19: He has acute renal injury as creatinine was normal on 07/13/2019. Have him stop HCTZ and have him drink lots of water today. He will need a repeat basic metabolic panel tomorrow morning before cardiac catheterization and will also need precath hydration. Please forward a copy of results to Cath Lab nursing staff as well. ______  Pt instructed to stop HCTZ, stay hydrated today, discussed precath hydration.

## 2019-10-28 ENCOUNTER — Encounter (HOSPITAL_COMMUNITY)
Admission: RE | Disposition: A | Discharge status: Home / Self Care | Payer: Medicare HMO | Source: Home / Self Care | Attending: Cardiovascular Disease

## 2019-10-28 ENCOUNTER — Ambulatory Visit (HOSPITAL_COMMUNITY)
Admission: RE | Admit: 2019-10-28 | Discharge: 2019-10-28 | Disposition: A | Discharge status: Home / Self Care | Payer: Medicare HMO | Attending: Cardiovascular Disease | Admitting: Cardiovascular Disease

## 2019-10-28 DIAGNOSIS — Z79899 Other long term (current) drug therapy: Secondary | ICD-10-CM | POA: Insufficient documentation

## 2019-10-28 DIAGNOSIS — I110 Hypertensive heart disease with heart failure: Secondary | ICD-10-CM | POA: Diagnosis not present

## 2019-10-28 DIAGNOSIS — Z7984 Long term (current) use of oral hypoglycemic drugs: Secondary | ICD-10-CM | POA: Diagnosis not present

## 2019-10-28 DIAGNOSIS — I2510 Atherosclerotic heart disease of native coronary artery without angina pectoris: Secondary | ICD-10-CM

## 2019-10-28 DIAGNOSIS — I25709 Atherosclerosis of coronary artery bypass graft(s), unspecified, with unspecified angina pectoris: Secondary | ICD-10-CM | POA: Insufficient documentation

## 2019-10-28 DIAGNOSIS — E119 Type 2 diabetes mellitus without complications: Secondary | ICD-10-CM | POA: Diagnosis not present

## 2019-10-28 DIAGNOSIS — K589 Irritable bowel syndrome without diarrhea: Secondary | ICD-10-CM | POA: Diagnosis not present

## 2019-10-28 DIAGNOSIS — I10 Essential (primary) hypertension: Secondary | ICD-10-CM | POA: Diagnosis present

## 2019-10-28 DIAGNOSIS — I509 Heart failure, unspecified: Secondary | ICD-10-CM | POA: Insufficient documentation

## 2019-10-28 DIAGNOSIS — I959 Hypotension, unspecified: Secondary | ICD-10-CM | POA: Insufficient documentation

## 2019-10-28 DIAGNOSIS — Z8546 Personal history of malignant neoplasm of prostate: Secondary | ICD-10-CM | POA: Diagnosis not present

## 2019-10-28 DIAGNOSIS — Z87891 Personal history of nicotine dependence: Secondary | ICD-10-CM | POA: Diagnosis not present

## 2019-10-28 DIAGNOSIS — R0789 Other chest pain: Secondary | ICD-10-CM | POA: Diagnosis present

## 2019-10-28 DIAGNOSIS — Z7982 Long term (current) use of aspirin: Secondary | ICD-10-CM | POA: Insufficient documentation

## 2019-10-28 DIAGNOSIS — I4510 Unspecified right bundle-branch block: Secondary | ICD-10-CM | POA: Diagnosis not present

## 2019-10-28 DIAGNOSIS — R9431 Abnormal electrocardiogram [ECG] [EKG]: Secondary | ICD-10-CM | POA: Diagnosis present

## 2019-10-28 DIAGNOSIS — Z20822 Contact with and (suspected) exposure to covid-19: Secondary | ICD-10-CM | POA: Insufficient documentation

## 2019-10-28 DIAGNOSIS — Z955 Presence of coronary angioplasty implant and graft: Secondary | ICD-10-CM | POA: Insufficient documentation

## 2019-10-28 DIAGNOSIS — I200 Unstable angina: Secondary | ICD-10-CM

## 2019-10-28 DIAGNOSIS — E785 Hyperlipidemia, unspecified: Secondary | ICD-10-CM | POA: Diagnosis not present

## 2019-10-28 HISTORY — PX: LEFT HEART CATH AND CORONARY ANGIOGRAPHY: CATH118249

## 2019-10-28 LAB — BASIC METABOLIC PANEL
Anion gap: 11 (ref 5–15)
BUN: 24 mg/dL — ABNORMAL HIGH (ref 8–23)
CO2: 21 mmol/L — ABNORMAL LOW (ref 22–32)
Calcium: 9.5 mg/dL (ref 8.9–10.3)
Chloride: 105 mmol/L (ref 98–111)
Creatinine, Ser: 1.33 mg/dL — ABNORMAL HIGH (ref 0.61–1.24)
GFR calc Af Amer: >60 mL/min (ref >60)
GFR calc non Af Amer: 54 mL/min — ABNORMAL LOW (ref >60)
Glucose, Bld: 188 mg/dL — ABNORMAL HIGH (ref 70–99)
Potassium: 4.6 mmol/L (ref 3.5–5.1)
Sodium: 137 mmol/L (ref 135–145)

## 2019-10-28 LAB — GLUCOSE, CAPILLARY: Glucose-Capillary: 184 mg/dL — ABNORMAL HIGH (ref 70–99)

## 2019-10-28 LAB — SARS CORONAVIRUS 2 BY RT PCR (HOSPITAL ORDER, PERFORMED IN ~~LOC~~ HOSPITAL LAB): SARS Coronavirus 2: NEGATIVE

## 2019-10-28 SURGERY — LEFT HEART CATH AND CORONARY ANGIOGRAPHY
Anesthesia: LOCAL

## 2019-10-28 MED ORDER — ONDANSETRON HCL 4 MG/2ML IJ SOLN: 4.0000 mg | Freq: Four times a day (QID) | INTRAMUSCULAR | Status: DC | PRN

## 2019-10-28 MED ORDER — SODIUM CHLORIDE 0.9 % WEIGHT BASED INFUSION
3.0000 mL/kg/h | INTRAVENOUS | Status: AC
  Administered 2019-10-28: 3 mL/kg/h via INTRAVENOUS

## 2019-10-28 MED ORDER — LABETALOL HCL 5 MG/ML IV SOLN: 10.0000 mg | INTRAVENOUS | Status: DC | PRN

## 2019-10-28 MED ORDER — HEPARIN SODIUM (PORCINE) 1000 UNIT/ML IJ SOLN
INTRAMUSCULAR | Status: AC
  Filled 2019-10-28: qty 1

## 2019-10-28 MED ORDER — FENTANYL CITRATE (PF) 100 MCG/2ML IJ SOLN
INTRAMUSCULAR | Status: DC | PRN
  Administered 2019-10-28: 25 ug via INTRAVENOUS

## 2019-10-28 MED ORDER — OXYCODONE HCL 5 MG PO TABS: 5.0000 mg | ORAL_TABLET | ORAL | Status: DC | PRN

## 2019-10-28 MED ORDER — SODIUM CHLORIDE 0.9 % IV SOLN: INTRAVENOUS | Status: DC

## 2019-10-28 MED ORDER — VERAPAMIL HCL 2.5 MG/ML IV SOLN
INTRAVENOUS | Status: AC
  Filled 2019-10-28: qty 2

## 2019-10-28 MED ORDER — ACETAMINOPHEN 325 MG PO TABS: 650.0000 mg | ORAL_TABLET | ORAL | Status: DC | PRN

## 2019-10-28 MED ORDER — HYDRALAZINE HCL 20 MG/ML IJ SOLN: 10.0000 mg | INTRAMUSCULAR | Status: DC | PRN

## 2019-10-28 MED ORDER — IOHEXOL 350 MG/ML SOLN
INTRAVENOUS | Status: DC | PRN
  Administered 2019-10-28: 55 mL

## 2019-10-28 MED ORDER — SODIUM CHLORIDE 0.9% FLUSH: 3.0000 mL | INTRAVENOUS | Status: DC | PRN

## 2019-10-28 MED ORDER — HEPARIN (PORCINE) IN NACL 1000-0.9 UT/500ML-% IV SOLN
INTRAVENOUS | Status: AC
  Filled 2019-10-28: qty 1000

## 2019-10-28 MED ORDER — LIDOCAINE HCL (PF) 1 % IJ SOLN
INTRAMUSCULAR | Status: DC | PRN
  Administered 2019-10-28: 2 mL

## 2019-10-28 MED ORDER — SODIUM CHLORIDE 0.9 % WEIGHT BASED INFUSION
1.0000 mL/kg/h | INTRAVENOUS | Status: DC
  Administered 2019-10-28: 1 mL/kg/h via INTRAVENOUS

## 2019-10-28 MED ORDER — SODIUM CHLORIDE 0.9 % IV SOLN: 250.0000 mL | INTRAVENOUS | Status: DC | PRN

## 2019-10-28 MED ORDER — ASPIRIN 81 MG PO CHEW: 81.0000 mg | CHEWABLE_TABLET | Freq: Every day | ORAL | Status: DC

## 2019-10-28 MED ORDER — MIDAZOLAM HCL 2 MG/2ML IJ SOLN
INTRAMUSCULAR | Status: AC
  Filled 2019-10-28: qty 2

## 2019-10-28 MED ORDER — LIDOCAINE HCL (PF) 1 % IJ SOLN
INTRAMUSCULAR | Status: AC
  Filled 2019-10-28: qty 30

## 2019-10-28 MED ORDER — HEPARIN (PORCINE) IN NACL 1000-0.9 UT/500ML-% IV SOLN
INTRAVENOUS | Status: DC | PRN
  Administered 2019-10-28 (×2): 500 mL

## 2019-10-28 MED ORDER — MIDAZOLAM HCL 2 MG/2ML IJ SOLN
INTRAMUSCULAR | Status: DC | PRN
  Administered 2019-10-28: 1 mg via INTRAVENOUS

## 2019-10-28 MED ORDER — VERAPAMIL HCL 2.5 MG/ML IV SOLN
INTRAVENOUS | Status: DC | PRN
  Administered 2019-10-28: 10 mL via INTRA_ARTERIAL

## 2019-10-28 MED ORDER — FENTANYL CITRATE (PF) 100 MCG/2ML IJ SOLN
INTRAMUSCULAR | Status: AC
  Filled 2019-10-28: qty 2

## 2019-10-28 MED ORDER — SODIUM CHLORIDE 0.9% FLUSH: 3.0000 mL | Freq: Two times a day (BID) | INTRAVENOUS | Status: DC

## 2019-10-28 MED ORDER — ASPIRIN 81 MG PO CHEW
81.0000 mg | CHEWABLE_TABLET | ORAL | Status: AC
  Administered 2019-10-28: 81 mg via ORAL
  Filled 2019-10-28: qty 1

## 2019-10-28 MED ORDER — HEPARIN SODIUM (PORCINE) 1000 UNIT/ML IJ SOLN
INTRAMUSCULAR | Status: DC | PRN
  Administered 2019-10-28: 3500 [IU] via INTRAVENOUS

## 2019-10-28 SURGICAL SUPPLY — 10 items
CATH 5FR JL3.5 JR4 ANG PIG MP (CATHETERS) ×1 IMPLANT
DEVICE RAD COMP TR BAND LRG (VASCULAR PRODUCTS) ×1 IMPLANT
GLIDESHEATH SLEND A-KIT 6F 22G (SHEATH) ×1 IMPLANT
GUIDEWIRE INQWIRE 1.5J.035X260 (WIRE) IMPLANT
INQWIRE 1.5J .035X260CM (WIRE) ×2
KIT HEART LEFT (KITS) ×2 IMPLANT
PACK CARDIAC CATHETERIZATION (CUSTOM PROCEDURE TRAY) ×2 IMPLANT
SHEATH PROBE COVER 6X72 (BAG) ×1 IMPLANT
TRANSDUCER W/STOPCOCK (MISCELLANEOUS) ×2 IMPLANT
TUBING CIL FLEX 10 FLL-RA (TUBING) ×2 IMPLANT

## 2019-10-28 NOTE — Discharge Instructions (Signed)
Drink plenty of fluid for 48 hours and keep wrist elevated at heart level for 24 hours  Radial Site Care   This sheet gives you information about how to care for yourself after your procedure. Your health care provider may also give you more specific instructions. If you have problems or questions, contact your health care provider. What can I expect after the procedure? After the procedure, it is common to have:  Bruising and tenderness at the catheter insertion area. Follow these instructions at home: Medicines  Take over-the-counter and prescription medicines only as told by your health care provider. Insertion site care 1. Follow instructions from your health care provider about how to take care of your insertion site. Make sure you: ? Wash your hands with soap and water before you change your bandage (dressing). If soap and water are not available, use hand sanitizer. ? remove your dressing as told by your health care provider. In 24 hours 2. Check your insertion site every day for signs of infection. Check for: ? Redness, swelling, or pain. ? Fluid or blood. ? Pus or a bad smell. ? Warmth. 3. Do not take baths, swim, or use a hot tub until your health care provider approves. 4. You may shower 24-48 hours after the procedure, or as directed by your health care provider. ? Remove the dressing and gently wash the site with plain soap and water. ? Pat the area dry with a clean towel. ? Do not rub the site. That could cause bleeding. 5. Do not apply powder or lotion to the site. Activity   1. For 24 hours after the procedure, or as directed by your health care provider: ? Do not flex or bend the affected arm. ? Do not push or pull heavy objects with the affected arm. ? Do not drive yourself home from the hospital or clinic. You may drive 24 hours after the procedure unless your health care provider tells you not to. ? Do not operate machinery or power tools. 2. Do not lift  anything that is heavier than 10 lb (4.5 kg), or the limit that you are told, until your health care provider says that it is safe. For 4 days 3. Ask your health care provider when it is okay to: ? Return to work or school. ? Resume usual physical activities or sports. ? Resume sexual activity. General instructions  If the catheter site starts to bleed, raise your arm and put firm pressure on the site. If the bleeding does not stop, get help right away. This is a medical emergency.  If you went home on the same day as your procedure, a responsible adult should be with you for the first 24 hours after you arrive home.  Keep all follow-up visits as told by your health care provider. This is important. Contact a health care provider if:  You have a fever.  You have redness, swelling, or yellow drainage around your insertion site. Get help right away if:  You have unusual pain at the radial site.  The catheter insertion area swells very fast.  The insertion area is bleeding, and the bleeding does not stop when you hold steady pressure on the area.  Your arm or hand becomes pale, cool, tingly, or numb. These symptoms may represent a serious problem that is an emergency. Do not wait to see if the symptoms will go away. Get medical help right away. Call your local emergency services (911 in the [place] Do not   drive yourself to the hospital. Summary  After the procedure, it is common to have bruising and tenderness at the site.  Follow instructions from your health care provider about how to take care of your radial site wound. Check the wound every day for signs of infection.  Do not lift anything that is heavier than 10 lb (4.5 kg), or the limit that you are told, until your health care provider says that it is safe. This information is not intended to replace advice given to you by your health care provider. Make sure you discuss any questions you have with your health care  provider. Document Revised: 06/24/2017 Document Reviewed: 06/24/2017 Elsevier Patient Education  2020 Elsevier Inc.  

## 2019-10-28 NOTE — CV Procedure (Signed)
   Left heart cath with coronary angio via right radial using real-time vascular ultrasound for radial access.  Left main is widely patent.  Mid and distal LAD stents are widely patent.  Ramus intermedius is relatively small and then bifurcates into 2 smaller branches.  Severe diffuse disease is noted proximal to the bifurcation and in each limb beyond the bifurcation and is very similar to findings in 2019.  First marginal contains 40% proximal.  Proximal to mid circumflex contains 30% narrowing.  RCA is large and smooth.  In the mid vessel there is 20% narrowing which appears to be improved compared to prior.  Left ventricle demonstrates EF 50 to 55%.  LVEDP 20 mmHg.  Overall stable coronary status since LAD stents were placed in 2019.  High-grade disease in the ramus intermedius which is relatively small and is not a good target for PCI.

## 2019-10-28 NOTE — Interval H&P Note (Signed)
Cath Lab Visit (complete for each Cath Lab visit)  Clinical Evaluation Leading to the Procedure:   ACS: No.  Non-ACS:    Anginal Classification: CCS III  Anti-ischemic medical therapy: Minimal Therapy (1 class of medications)  Non-Invasive Test Results: No non-invasive testing performed  Prior CABG: No previous CABG      History and Physical Interval Note:  10/28/2019 12:21 PM  Ronald Faulkner  has presented today for surgery, with the diagnosis of angina.  The various methods of treatment have been discussed with the patient and family. After consideration of risks, benefits and other options for treatment, the patient has consented to  Procedure(s): LEFT HEART CATH AND CORONARY ANGIOGRAPHY (N/A) as a surgical intervention.  The patient's history has been reviewed, patient examined, no change in status, stable for surgery.  I have reviewed the patient's chart and labs.  Questions were answered to the patient's satisfaction.     [name]

## 2019-10-28 NOTE — Progress Notes (Signed)
Patient and wife was given discharge instructions. Both verbalized understanding. 

## 2019-10-28 NOTE — Progress Notes (Signed)
Patient went for covid test through the drive thru yesterday. There are no results in Epic, called lab, the lab person said there was no saline in the tube so they couldn't run the specimen.  The lab was canceled.  We will recollect on patients arrival

## 2019-11-01 ENCOUNTER — Ambulatory Visit (HOSPITAL_COMMUNITY)
Admission: RE | Admit: 2019-11-01 | Discharge: 2019-11-01 | Disposition: A | Discharge status: Home / Self Care | Payer: Medicare HMO | Source: Ambulatory Visit | Attending: Internal Medicine | Admitting: Internal Medicine

## 2019-11-01 ENCOUNTER — Other Ambulatory Visit: Payer: Self-pay

## 2019-11-01 ENCOUNTER — Other Ambulatory Visit: Payer: Self-pay | Admitting: Student

## 2019-11-01 DIAGNOSIS — R110 Nausea: Secondary | ICD-10-CM | POA: Insufficient documentation

## 2019-11-07 ENCOUNTER — Other Ambulatory Visit (INDEPENDENT_AMBULATORY_CARE_PROVIDER_SITE_OTHER): Payer: Self-pay | Admitting: *Deleted

## 2019-11-07 DIAGNOSIS — N281 Cyst of kidney, acquired: Secondary | ICD-10-CM

## 2019-11-21 ENCOUNTER — Other Ambulatory Visit: Payer: Self-pay | Admitting: Cardiovascular Disease

## 2019-11-24 NOTE — Progress Notes (Signed)
Cardiology Office Note  Date: 11/25/2019   ID: Linell, Shawn [birth year], MRN [id number]  PCP:  Adaline Sill, NP  Cardiologist:  Kate Sable, MD Electrophysiologist:  None   Chief Complaint: Follow-up accelerating angina  History of Present Illness: TAVI HOOGENDOORN is a 69 y.o. male with a history of accelerating angina with history of CAD.  DES to mid and distal LAD 07/31/2017.  Moderately severe stenosis and small caliber intermediate branch and medical management was recommended.  Moderate stenosis in the distal RCA 50%.  Other medical history includes HTN and DM2 . Last saw Dr. Bronson Ing 10/26/2019.  He had been complaining the previous month of episodic chest pain lasting for approximately 5 seconds, occurring at rest, sometimes occurring every day, or every 2 or 3 days.  Having marked shortness of breath with exertion walking 20 to 30 feet from kitchen to the living room.  Short of breath when climbing a flight of stairs.  Feeling dizzy when bending over.  Used nitroglycerin on 2 separate occasions.  A cardiac catheterization was ordered.  HCTZ was stopped and losartan was reduced to 50 mg due to hypotension.  Last LDL 132 on 03/24/2018.  He did not tolerate atorvastatin 40 mg daily.  Currently taking Crestor 5 mg every Monday, Wednesday, Friday.  No longer on Zetia.  Still having some mild myalgia but not as severe as when taking Lipitor.   Status post cardiac catheterization 10/28/2019: Patent LAD mid and distal stent.  High-grade diffuse obstructive disease and branching relatively small RI, slight progression compared to 2019.  Widely patent circumflex for first obtuse marginal containing 50% proximal narrowing, widely patent dominant RCA with 30 to 40% narrowing.  EF 55%.  Continue current medical therapy recommended.   Patient here for follow-up status post cardiac catheterization.  We discussed the findings of cardiac catheterization.  Patient verbalizes understanding.   He continues to have intermittent very short bursts of sharp chest pains lasting only a second or 2.  States it is not related to exertional activity.  States it can happen at any time.  Denies any radiation to the neck, arm, back, jaw.  Denies any associated nausea, vomiting, or diaphoresis.  States he has some occasional neck pain but also describes it as not related to activity.  Blood pressure is much better since stopping HCTZ and decreasing losartan.  Blood pressure today on arrival 138/84.  Patient states at home it usually in the 308-657 systolic range.  Denies any orthostatic symptoms, PND orthopnea, bleeding, claudication, DVT or PE-like symptoms, or lower extremity edema.  Past Medical History:  Diagnosis Date  . Arthritis   . CAD (coronary artery disease)    a. s/p DES to mid and distal LAD in 07/2017 with medical management recommended of intermediate branch  . Cancer [facility])    prostate  . CHF (congestive heart failure) ([place])   . Colon polyps   . Diabetes mellitus without complication ([place])   . Diverticulitis   . History of kidney stones   . Hypertension   . IBS (irritable bowel syndrome)   . 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 Unstable angina ([place]) 07/2017    Past Surgical History:  Procedure Laterality Date  . ABDOMINAL SURGERY    . APPENDECTOMY    . BREAST SURGERY     benign lump at lt side  . COLON SURGERY    . COLONOSCOPY  03/14/2011   Procedure: COLONOSCOPY;  Surgeon: [name], MD;  [facility];  Service: Endoscopy;  Laterality: N/A;  1:00  . COLONOSCOPY N/A 11/15/2015   Procedure: COLONOSCOPY;  Surgeon: [name], MD;  [facility];  Service: Endoscopy;  Laterality: N/A;  11:15  . COLONOSCOPY WITH PROPOFOL N/A 07/15/2019   Procedure: COLONOSCOPY WITH PROPOFOL;  Surgeon: [name], MD;  [facility]: [facility];  Service: Endoscopy;  Laterality: N/A;  1235  . CORONARY STENT INTERVENTION  N/A 07/31/2017   Procedure: CORONARY STENT INTERVENTION;  Surgeon: [name], MD;  Location: [place] CV LAB;  Service: Cardiovascular;  Laterality: N/A;  . LEFT HEART CATH AND CORONARY ANGIOGRAPHY N/A 07/31/2017   Procedure: LEFT HEART CATH AND CORONARY ANGIOGRAPHY;  Surgeon: [name], MD;  Location: [place] CV LAB;  Service: Cardiovascular;  Laterality: N/A;  . LEFT HEART CATH AND CORONARY ANGIOGRAPHY N/A 10/28/2019   Procedure: LEFT HEART CATH AND CORONARY ANGIOGRAPHY;  Surgeon: Belva Crome, MD;  Location: [place] CV LAB;  Service: Cardiovascular;  Laterality: N/A;  . PENILE PROSTHESIS IMPLANT     2016   . prostate cancer     2015. prostectomy    Current Outpatient Medications  Medication Sig Dispense Refill  . allopurinol (ZYLOPRIM) 300 MG tablet Take 300 mg by mouth daily.     Marland Kitchen aspirin EC 81 MG tablet Take 81 mg by mouth daily.    . dapagliflozin propanediol (FARXIGA) 10 MG TABS tablet Take 10 mg by mouth daily.    . diphenhydrAMINE (BENADRYL) 50 MG tablet Take 1 tablet (50 mg total) by mouth as needed (once per cath instructions). 1 tablet 0  . ferrous sulfate 324 (65 Fe) MG TBEC Take 324 mg by mouth every other day. Patient states that he takes onr by mouth every other day.     . isosorbide mononitrate (IMDUR) 60 MG 24 hr tablet Take 1 tablet by mouth once daily 90 tablet 1  . linaclotide (LINZESS) 72 MCG capsule Take 72 mcg by mouth every other day.    . losartan (COZAAR) 50 MG tablet Take 1 tablet (50 mg total) by mouth daily. 30 tablet 6  . metFORMIN (GLUCOPHAGE) 500 MG tablet Take 1 tablet (500 mg total) by mouth 2 (two) times daily with a meal.    . metoprolol succinate (TOPROL-XL) 25 MG 24 hr tablet TAKE 1 & 1/2 (ONE & ONE-HALF) TABLETS BY MOUTH ONCE DAILY 135 tablet 1  . montelukast (SINGULAIR) 10 MG tablet Take 10 mg by mouth at bedtime.    . nitroGLYCERIN (NITROSTAT) 0.4 MG SL tablet DISSOLVE ONE TABLET UNDER THE TONGUE EVERY 5 MINUTES  AS NEEDED FOR CHEST PAIN.  DO NOT EXCEED A TOTAL OF 3 DOSES IN 15 MINUTES (Patient taking differently: Place 0.4 mg under the tongue every 5 (five) minutes as needed for chest pain. ) 25 tablet 1  . oxymetazoline (12 HOUR NASAL SPRAY) 0.05 % nasal spray Place 1 spray into both nostrils 2 (two) times daily as needed for congestion.     . pantoprazole (PROTONIX) 40 MG tablet Take 1 tablet (40 mg total) by mouth daily before breakfast. 30 tablet 5  . predniSONE (DELTASONE) 50 MG tablet Take 1 tablet (50 mg total) by mouth as needed (per cath instructions). 3 tablet 0  . rosuvastatin (CRESTOR) 5 MG tablet TAKE 1 TABLET BY MOUTH ONCE DAILY ON  MONDAY,  WEDNESDAY,  AND  FRIDAY  ONLY  STOPPING  LIPITOR (Patient taking  differently: Take 5 mg by mouth every Monday, Wednesday, and Friday. ) 36 tablet 3   Current Facility-Administered Medications  Medication Dose Route Frequency Provider Last Rate Last Admin  . sodium chloride flush (NS) 0.9 % injection 3 mL  3 mL Intravenous Q12H Herminio Commons, MD       Allergies:  Bee venom, Fish allergy, and Ivp dye [iodinated diagnostic agents]   Social History: The patient  reports that he quit smoking about 20 years ago. His smoking use included cigarettes. He has a 35.00 pack-year smoking history. He has never used smokeless tobacco. He reports current alcohol use. He reports that he does not use drugs.   Family History: The patient's family history includes Colon cancer in his mother; Coronary artery disease in his maternal grandmother.   ROS:  Please see the history of present illness. Otherwise, complete review of systems is positive for none.  All other systems are reviewed and negative.   Physical Exam: VS:  BP 138/84   Pulse 96   Ht 5\' 7"  (1.702 m)   Wt 165 lb (74.8 kg)   SpO2 98%   BMI 25.84 kg/m , BMI Body mass index is 25.84 kg/m.  Wt Readings from Last 3 Encounters:  11/25/19 165 lb (74.8 kg)  10/28/19 160 lb (72.6 kg)  10/26/19 160 lb 3.2  oz (72.7 kg)    General: Patient appears comfortable at rest. Neck: Supple, no elevated JVP or carotid bruits, no thyromegaly. Lungs: Clear to auscultation, nonlabored breathing at rest. Cardiac: Regular rate and rhythm, no S3 or significant systolic murmur, no pericardial rub. Extremities: No pitting edema, distal pulses 2+. Skin: Warm and dry. Musculoskeletal: No kyphosis. Neuropsychiatric: Alert and oriented x3, affect grossly appropriate.  ECG:  EKG on 10/26/2019 sinus rhythm with right bundle branch block possible RVH, consider pulmonary disease.  Heart rate 99  Recent Labwork: 10/27/2019: Hemoglobin 12.7; Platelets 252 10/28/2019: BUN 24; Creatinine, Ser 1.33; Potassium 4.6; Sodium 137     Component Value Date/Time   CHOL 195 05/27/2017 0844   TRIG 253 (H) 05/27/2017 0844   HDL 45 05/27/2017 0844   CHOLHDL 4.3 05/27/2017 0844   VLDL 51 (H) 05/27/2017 0844   LDLCALC 99 05/27/2017 0844    Other Studies Reviewed Today:  Cardiac cath 10/28/2019   Patent left main  Patent LAD mid and distal stent.  High-grade diffuse obstructive disease in a branching relatively small ramus intermedius.  Slight progression compared to 2019.  Widely patent circumflex with first obtuse marginal containing a 50% proximal narrowing  Widely patent dominant right coronary with 30 to 40% narrowing distally.  Normal LV function with EF 55%.  LVEDP 20 mmHg.  RECOMMENDATIONS:   Continue current therapy  Further medication adjustments and management per Dr.,[place] Dominance: Co-dominant  Intervention    Echocardiogram 10/13/2018 IMPRESSIONS    1. The left ventricle has normal systolic function with an ejection  fraction of 60-65%. The cavity size was normal. Left ventricular diastolic  Doppler parameters are consistent with impaired relaxation. No evidence of  left ventricular regional wall  motion abnormalities.  2. The right ventricle has normal systolic function. The cavity  was  normal. There is no increase in right ventricular wall thickness.  3. The aortic valve is tricuspid. Mild aortic annular calcification  noted.  4. The mitral valve is grossly normal. There is mild mitral annular  calcification present.  5. The tricuspid valve is grossly normal.  6. The aortic root is normal in size  and structure   Assessment and Plan:  1. CAD in native artery   2. Essential hypertension   3. Hyperlipidemia LDL goal <70    1. CAD in native artery Status post cardiac catheterization 10/28/2019: Patent LAD mid and distal stent.  High-grade diffuse obstructive disease and branching relatively small RI, slight progression compared to 2019.  Widely patent circumflex  first obtuse marginal containing 50% proximal narrowing, widely patent dominant RCA with 30 to 40% narrowing.  EF 55%.  Patient complains of occasional very short bursts of sharp chest pain lasting only a second or 2.  Denies any radiation to the neck, arm, back, jaw.  Continue aspirin 81 mg daily, Imdur 60 mg daily, Toprol-XL 25 mg daily, nitroglycerin sublingual as needed  2. Essential hypertension Blood pressure well controlled at home.  Slightly elevated today at 138/84.  Patient states that home systolic blood pressures run from 115-120.  Continue losartan 50 mg.  4. Hyperlipidemia LDL goal <70 Last recorded lipid panel and iron records 05/27/2017 showed TC 195, TG 253, HDL 45, LDL 99.  Continue Crestor 5 mg on Mondays, Wednesdays, and Fridays.   Medication Adjustments/Labs and Tests Ordered: Current medicines are reviewed at length with the patient today.  Concerns regarding medicines are outlined above.   Disposition: Follow-up with Dr. Bronson Ing or APP 6 months. Signed, Levell July, NP 11/25/2019 2:51 PM    [facility] at [place], [place], [place] [address] Phone: [phone]; Fax: [phone]

## 2019-11-25 ENCOUNTER — Encounter (INDEPENDENT_AMBULATORY_CARE_PROVIDER_SITE_OTHER): Payer: Self-pay

## 2019-11-25 ENCOUNTER — Ambulatory Visit: Payer: Medicare HMO | Admitting: Family Medicine

## 2019-11-25 ENCOUNTER — Other Ambulatory Visit: Payer: Self-pay

## 2019-11-25 ENCOUNTER — Encounter: Payer: Self-pay | Admitting: Family Medicine

## 2019-11-25 VITALS — BP 138/84 | HR 96 | Ht 67.0 in | Wt 165.0 lb

## 2019-11-25 DIAGNOSIS — I2510 Atherosclerotic heart disease of native coronary artery without angina pectoris: Secondary | ICD-10-CM | POA: Diagnosis not present

## 2019-11-25 DIAGNOSIS — E785 Hyperlipidemia, unspecified: Secondary | ICD-10-CM | POA: Diagnosis not present

## 2019-11-25 DIAGNOSIS — I10 Essential (primary) hypertension: Secondary | ICD-10-CM | POA: Diagnosis not present

## 2019-11-25 NOTE — Patient Instructions (Signed)

## 2019-11-29 ENCOUNTER — Ambulatory Visit (HOSPITAL_COMMUNITY): Payer: Medicare HMO

## 2019-11-29 ENCOUNTER — Ambulatory Visit (HOSPITAL_COMMUNITY)
Admission: RE | Admit: 2019-11-29 | Discharge: 2019-11-29 | Disposition: A | Discharge status: Home / Self Care | Payer: Medicare HMO | Source: Ambulatory Visit | Attending: Internal Medicine | Admitting: Internal Medicine

## 2019-11-29 ENCOUNTER — Other Ambulatory Visit: Payer: Self-pay

## 2019-11-29 DIAGNOSIS — N281 Cyst of kidney, acquired: Secondary | ICD-10-CM | POA: Diagnosis present

## 2019-11-29 MED ORDER — GADOBUTROL 1 MMOL/ML IV SOLN
7.0000 mL | Freq: Once | INTRAVENOUS | Status: AC | PRN
  Administered 2019-11-29: 7 mL via INTRAVENOUS

## 2019-12-16 DIAGNOSIS — N281 Cyst of kidney, acquired: Secondary | ICD-10-CM | POA: Insufficient documentation

## 2020-01-24 ENCOUNTER — Other Ambulatory Visit: Payer: Self-pay | Admitting: *Deleted

## 2020-01-24 ENCOUNTER — Telehealth: Payer: Self-pay | Admitting: Family Medicine

## 2020-01-24 MED ORDER — ROSUVASTATIN CALCIUM 5 MG PO TABS: ORAL_TABLET | ORAL | 3 refills | Status: DC

## 2020-01-24 NOTE — Telephone Encounter (Signed)
Please call [facility] in regards to patients recent RX of Crestor that was called in. They are not clear on directions.

## 2020-01-25 NOTE — Telephone Encounter (Signed)
Confirmed that patient takes crestor 5 mg 3 times per week.

## 2020-04-08 IMAGING — CT CT ABD-PELV W/O
2 of 4 series · 15 of 46 positions shown, 17 images · non-contrast
Comparison: None.

CLINICAL DATA: Abdominal pain with nausea

EXAM:
CT ABDOMEN AND PELVIS WITHOUT CONTRAST
TECHNIQUE: Multidetector CT imaging of the abdomen and pelvis was performed
following the standard protocol without IV contrast.

[Series 2: axial st · axial · 0.66mm/px · z∈[+924,+1334]mm · 12 of 94 slices shown, 14 images]
[im 8/94  soft-tissue]
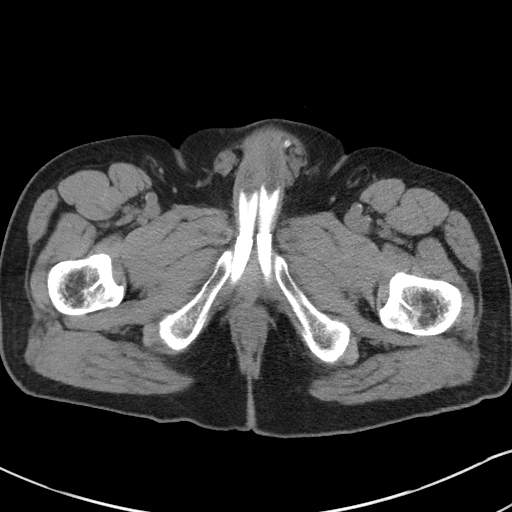
[im 8/94  bone]
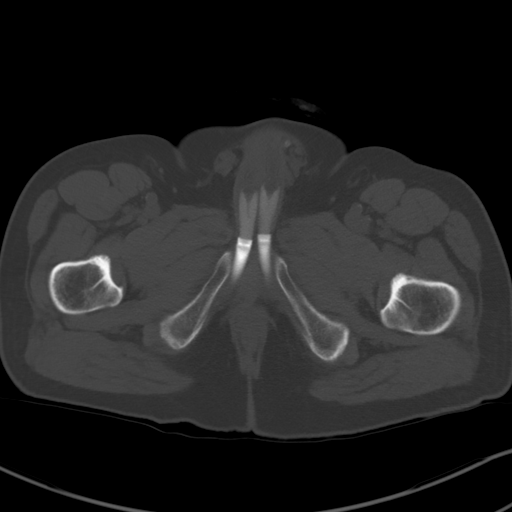
[im 15/94  soft-tissue]
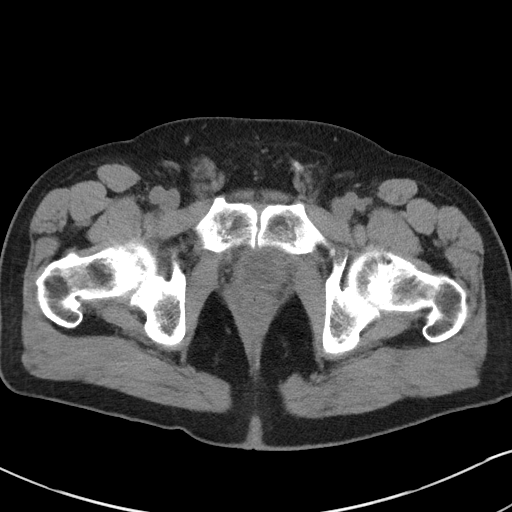
[im 23/94  soft-tissue]
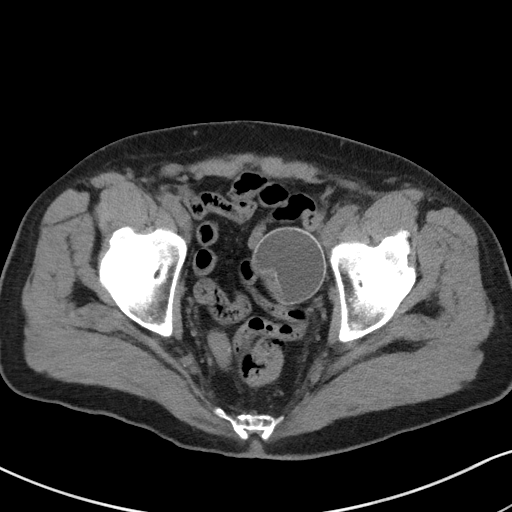
[im 30/94  soft-tissue]
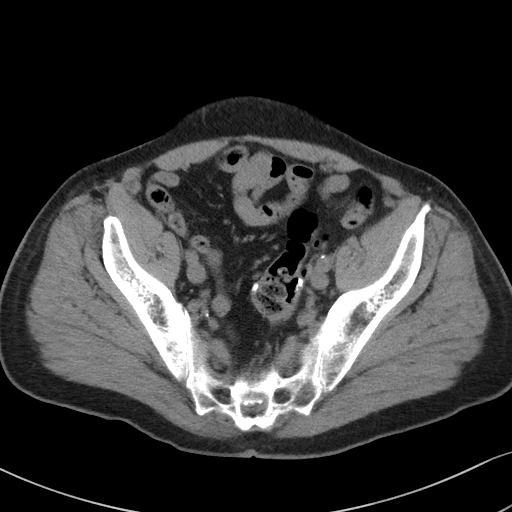
[im 38/94  soft-tissue]
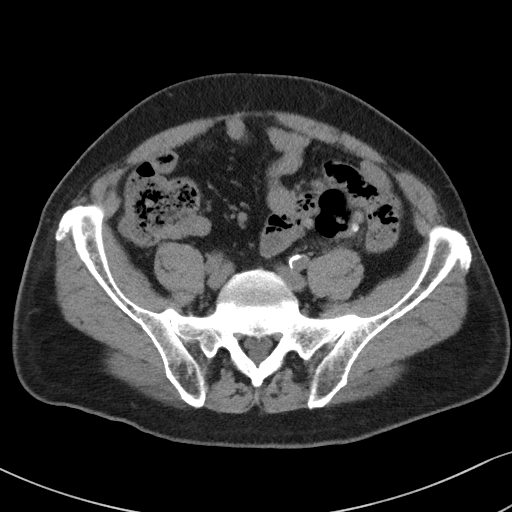
[im 45/94  soft-tissue]
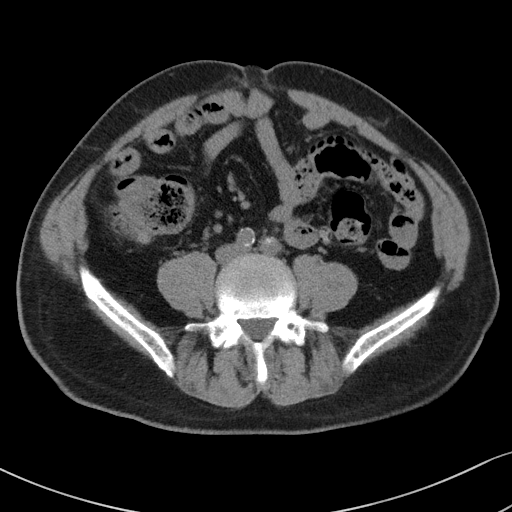
[im 53/94  soft-tissue]
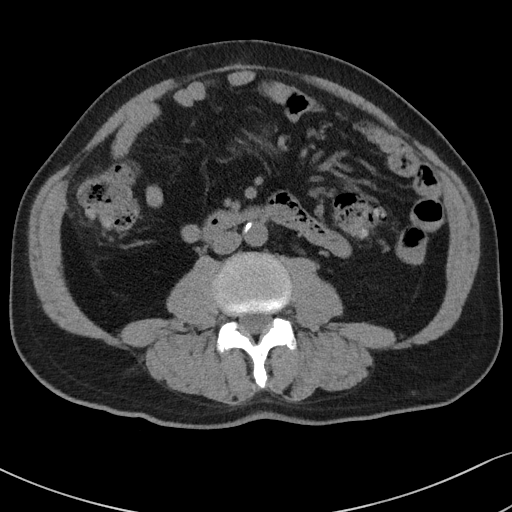
[im 60/94  soft-tissue]
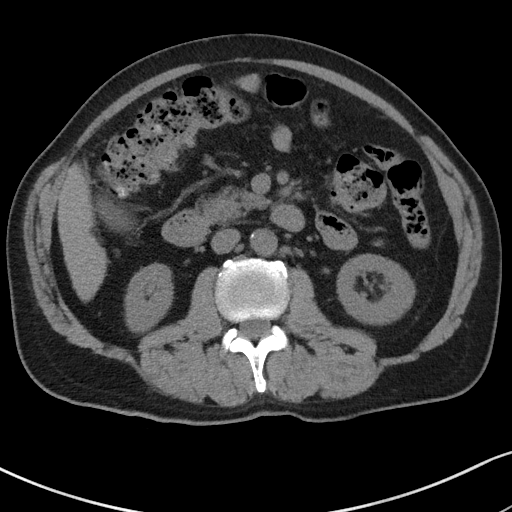
[im 67/94  soft-tissue]
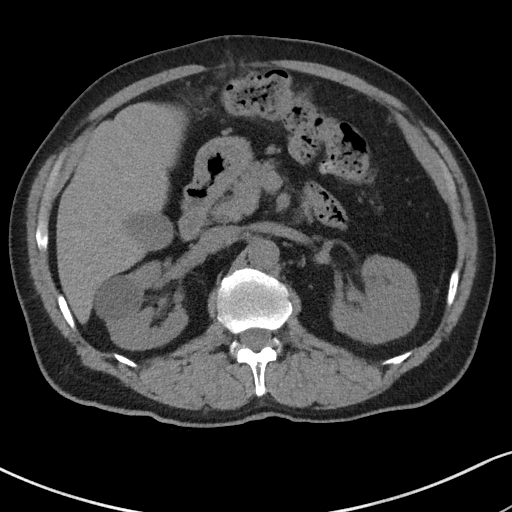
[im 67/94  bone]
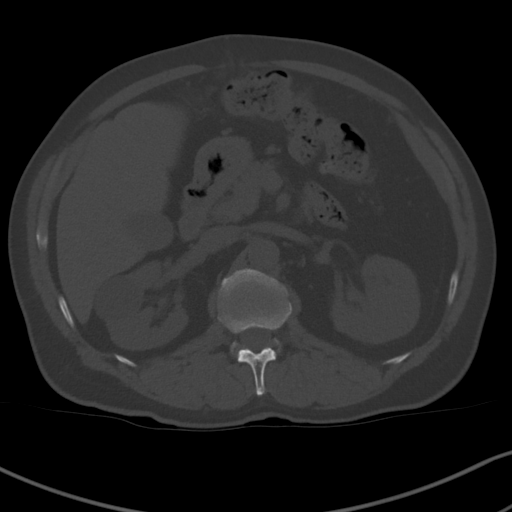
[im 75/94  soft-tissue]
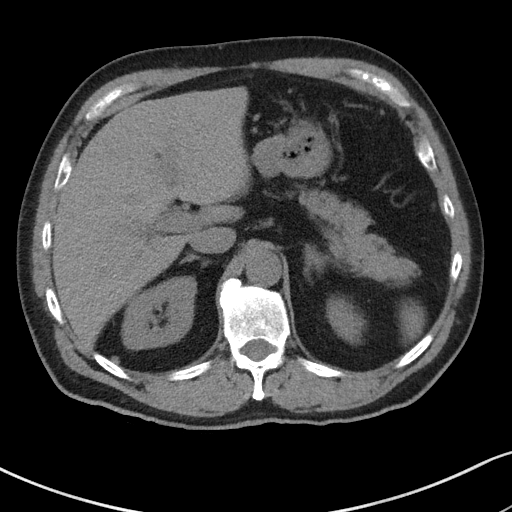
[im 82/94  soft-tissue]
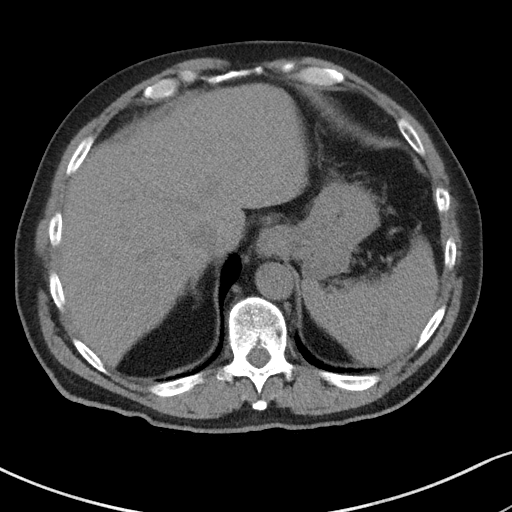
[im 90/94  soft-tissue]
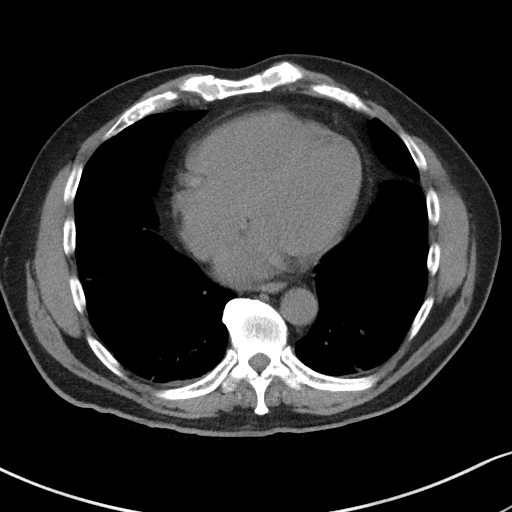

[Series 5: coronal st · coronal · 0.64mm/px · 3 of 90 slices shown]
[im 30/90  soft-tissue]
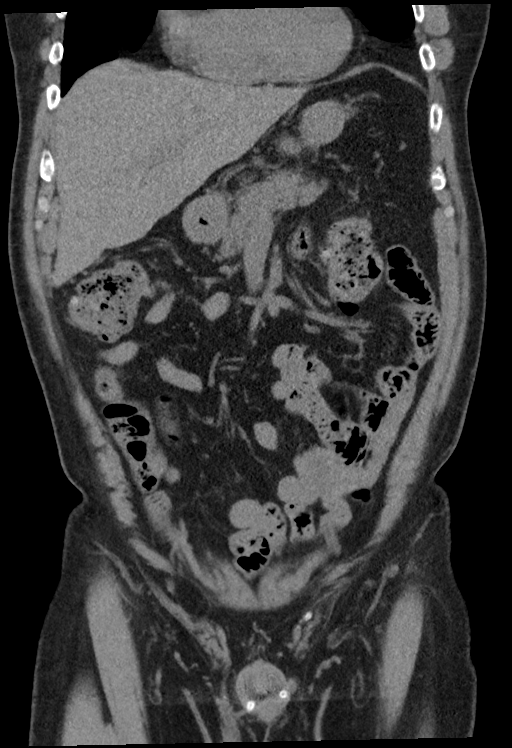
[im 40/90  soft-tissue]
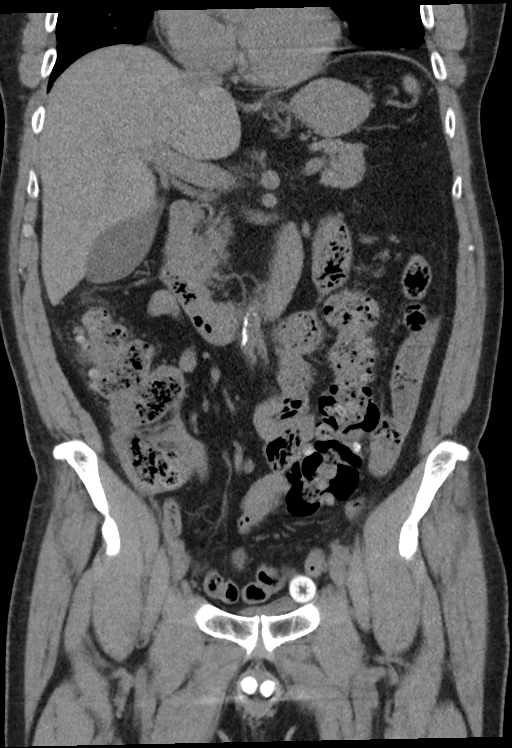
[im 50/90  soft-tissue]
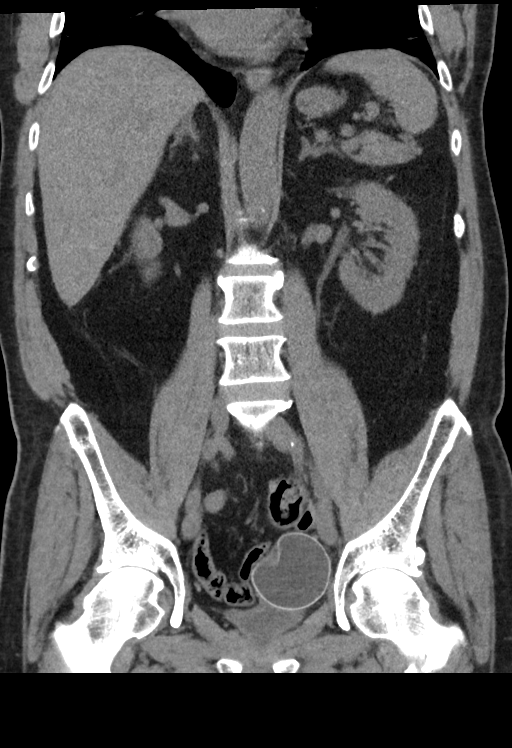

[15 of 46 positions shown; findings below may reference images not displayed]

FINDINGS: Lower chest: Mild atelectatic changes are noted in the bases
bilaterally. No effusion or focal infiltrate is seen.

Hepatobiliary: No focal liver abnormality is seen. No gallstones,
gallbladder wall thickening, or biliary dilatation.

Pancreas: Unremarkable. No pancreatic ductal dilatation or
surrounding inflammatory changes.

Spleen: Normal in size without focal abnormality.

Adrenals/Urinary Tract: Adrenal glands are within normal limits
bilaterally. Kidneys are well visualized bilaterally with
nonobstructing stone in the lower pole of the right kidney measuring
6 mm. 3.1 cm hypodense lesion is noted within the right kidney
consistent with cyst. No left renal calculi are seen. No obstructive
changes are noted. The bladder is decompressed.

Stomach/Bowel: Scattered diverticular changes noted throughout the
colon. There are changes consistent with diverticulitis in the mid
transverse colon and proximal ascending colon without abscess
formation or are focal perforation. Stomach is decompressed. No
small bowel abnormality is seen. The appendix has been surgically
removed. Postsurgical changes in the sigmoid colon are noted.

Vascular/Lymphatic: Aortic atherosclerosis. No enlarged abdominal or
pelvic lymph nodes. Circumaortic left renal vein is noted.

Reproductive: Penile implant is noted with reservoir deep within the
pelvis. Prostate has been surgically removed.

Other: No ascites is noted. Small omental fat containing
supraumbilical hernia is noted.

Musculoskeletal: Degenerative changes of lumbar spine are noted.
IMPRESSION: Changes consistent with diverticulitis in the mid transverse and
proximal ascending colon without evidence of abscess or perforation.

Small hernia in the anterior abdominal wall containing omental fat
is noted.

Nonobstructing right renal stone.

Hypodense lesion likely representing right renal cyst.

## 2020-04-30 ENCOUNTER — Other Ambulatory Visit (INDEPENDENT_AMBULATORY_CARE_PROVIDER_SITE_OTHER): Payer: Self-pay | Admitting: Internal Medicine

## 2020-04-30 NOTE — Telephone Encounter (Signed)
Last seen 10/25/2019 for N&V with Dr. Laural Golden

## 2020-05-01 ENCOUNTER — Other Ambulatory Visit: Payer: Self-pay | Admitting: *Deleted

## 2020-05-01 MED ORDER — ISOSORBIDE MONONITRATE ER 60 MG PO TB24: 60.0000 mg | ORAL_TABLET | Freq: Every day | ORAL | 1 refills | Status: DC

## 2020-05-11 ENCOUNTER — Other Ambulatory Visit: Payer: Self-pay | Admitting: *Deleted

## 2020-05-11 MED ORDER — METOPROLOL SUCCINATE ER 25 MG PO TB24: ORAL_TABLET | ORAL | 2 refills | Status: DC

## 2020-05-30 ENCOUNTER — Other Ambulatory Visit: Payer: Self-pay

## 2020-05-30 MED ORDER — LOSARTAN POTASSIUM 50 MG PO TABS: 50.0000 mg | ORAL_TABLET | Freq: Every day | ORAL | 6 refills | Status: DC

## 2020-05-30 NOTE — Telephone Encounter (Signed)
Refilled losartan 50 mg qd to [facility]

## 2020-06-05 ENCOUNTER — Encounter: Payer: Self-pay | Admitting: Nurse Practitioner

## 2020-06-05 ENCOUNTER — Other Ambulatory Visit: Payer: Self-pay

## 2020-06-05 ENCOUNTER — Ambulatory Visit (INDEPENDENT_AMBULATORY_CARE_PROVIDER_SITE_OTHER): Payer: Medicare HMO | Admitting: Nurse Practitioner

## 2020-06-05 VITALS — BP 134/70 | HR 74 | Temp 98.0°F | Ht 67.0 in | Wt 159.0 lb

## 2020-06-05 DIAGNOSIS — Z7689 Persons encountering health services in other specified circumstances: Secondary | ICD-10-CM | POA: Diagnosis not present

## 2020-06-05 DIAGNOSIS — E119 Type 2 diabetes mellitus without complications: Secondary | ICD-10-CM

## 2020-06-05 DIAGNOSIS — M545 Low back pain, unspecified: Secondary | ICD-10-CM | POA: Diagnosis not present

## 2020-06-05 DIAGNOSIS — I25119 Atherosclerotic heart disease of native coronary artery with unspecified angina pectoris: Secondary | ICD-10-CM | POA: Diagnosis not present

## 2020-06-05 DIAGNOSIS — I10 Essential (primary) hypertension: Secondary | ICD-10-CM | POA: Diagnosis not present

## 2020-06-05 DIAGNOSIS — Z8719 Personal history of other diseases of the digestive system: Secondary | ICD-10-CM

## 2020-06-05 DIAGNOSIS — W19XXXA Unspecified fall, initial encounter: Secondary | ICD-10-CM

## 2020-06-05 LAB — POCT URINALYSIS DIPSTICK
Bilirubin, UA: NEGATIVE
Blood, UA: NEGATIVE
Glucose, UA: NEGATIVE
Ketones, UA: NEGATIVE
Leukocytes, UA: NEGATIVE
Nitrite, UA: NEGATIVE
Protein, UA: POSITIVE — AB
Spec Grav, UA: >=1.03 — AB (ref 1.010–1.025)
Urobilinogen, UA: 1 E.U./dL
pH, UA: 6 (ref 5.0–8.0)

## 2020-06-05 MED ORDER — NITROGLYCERIN 0.4 MG SL SUBL: SUBLINGUAL_TABLET | SUBLINGUAL | 0 refills | Status: AC

## 2020-06-05 NOTE — Patient Instructions (Addendum)

## 2020-06-05 NOTE — Progress Notes (Signed)
I,Ronald Faulkner as a Education administrator for [employer], FNP.,have documented all relevant documentation on the behalf of Ronald Brine, FNP,as directed by  Ronald Brine, FNP while in the presence of Ronald Faulkner, Ronald Faulkner. This visit occurred during the SARS-CoV-2 public health emergency.  Safety protocols were in place, including screening questions prior to the visit, additional usage of staff PPE, and extensive cleaning of exam room while observing appropriate contact time as indicated for disinfecting solutions.  Subjective:     Patient ID: Ronald Faulkner , male    DOB: 10-02-1950 , 70 y.o.   MRN: [id number]   Chief Complaint  Patient presents with  . Establish Care  . Diabetes    HPI  Patient here to establish primary care. He is here to f/u on his dm and blood pressure.  He was referred by his wife Ronald Faulkner) and his mother in law Ronald Faulkner).  He had been going to [facility] (Ronald Faulkner, [place]).  2 biological children - son is a diabetic on insulin.  Daughter is healthy as far as he knows.  He has 3 step children.   He does have cardiologist Dr. Bronson Faulkner.  Prostate cancer - removed his prostate stage 1 (2015), since then has done a PSA until this past year was 0.01. he has had about a foot of his colon removed. His last colonoscopy was in 2020. Ronald Faulkner - scheduled follow up in 5 years. DM - he is taking metformin and was on farxiga but stopped taking 2 months ago due to cost $127 for 30 days. His HgbA1c was up to 7.9 this is why he was started on Farxiga. He would like to try Ozempic. He has cardiac stents. History of gout but no flare in years.   Wt Readings from Last 3 Encounters: 06/05/20 : 159 lb (72.1 kg) 11/25/19 : 165 lb (74.8 kg) 10/28/19 : 160 lb (72.6 kg)  Fell off his lawnmower a couple months ago and has been having pain to his buttocks area.   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   He reports he had blood in  his stool the latter part of 2021 but has not been able to see any one other than his PCP.  He does have diverticulosis.  He has not tried to call Dr. Laural Faulkner.    Diabetes He presents for his follow-up diabetic visit. He has type 2 diabetes mellitus. Pertinent negatives for hypoglycemia include no dizziness or headaches. Pertinent negatives for diabetes include no chest pain. There are no hypoglycemic complications. Diabetic complications include peripheral neuropathy. Current diabetic treatment includes oral agent (dual therapy) (supposed to be taking farxiga but stopped.). (Blood sugar today was 124, has been as low as 110. One day last week up to 202 - this was over the holidays and he had sweet potato pie and cake.)     Past Medical History:  Diagnosis Date  . Arthritis   . CAD (coronary artery disease)    a. s/p DES to mid and distal LAD in 07/2017 with medical management recommended of intermediate branch  . Cancer [facility])    prostate  . CHF (congestive heart failure) ([place])   . Colon polyps   . Diabetes mellitus without complication ([place])   . Diverticulitis   . History of kidney stones   . Hypertension   . IBS (irritable bowel syndrome)   . 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  Unstable angina ([place]) 07/2017     Family History  Problem Relation Age of Onset  . Colon cancer Mother   . Coronary artery disease Maternal Grandmother   . Anesthesia problems Neg Hx   . Hypotension Neg Hx   . Malignant hyperthermia Neg Hx   . Pseudochol deficiency Neg Hx      Current Outpatient Medications:  .  allopurinol (ZYLOPRIM) 300 MG tablet, Take 300 mg by mouth daily. , Disp: , Rfl:  .  aspirin EC 81 MG tablet, Take 81 mg by mouth daily., Disp: , Rfl:  .  diphenhydrAMINE (BENADRYL) 50 MG tablet, Take 1 tablet (50 mg total) by mouth as needed (once per cath instructions)., Disp: 1 tablet, Rfl: 0 .  isosorbide mononitrate (IMDUR) 60 MG 24 hr tablet, Take 1  tablet (60 mg total) by mouth daily., Disp: 90 tablet, Rfl: 1 .  linaclotide (LINZESS) 72 MCG capsule, Take 72 mcg by mouth every other day., Disp: , Rfl:  .  losartan (COZAAR) 50 MG tablet, Take 1 tablet (50 mg total) by mouth daily., Disp: 30 tablet, Rfl: 6 .  metFORMIN (GLUCOPHAGE) 500 MG tablet, Take 1 tablet (500 mg total) by mouth 2 (two) times daily with a meal., Disp: , Rfl:  .  metoprolol succinate (TOPROL-XL) 25 MG 24 hr tablet, TAKE 1 & 1/2 (ONE & ONE-HALF) TABLETS BY MOUTH ONCE DAILY, Disp: 135 tablet, Rfl: 2 .  oxymetazoline (AFRIN) 0.05 % nasal spray, Place 1 spray into both nostrils 2 (two) times daily as needed for congestion. , Disp: , Rfl:  .  pantoprazole (PROTONIX) 40 MG tablet, TAKE 1 TABLET BY MOUTH ONCE DAILY BEFORE BREAKFAST, Disp: 90 tablet, Rfl: 1 .  rosuvastatin (CRESTOR) 5 MG tablet, BY MOUTH ON MONDAY,&wEDNESDAY & FRIDAY, Disp: 36 tablet, Rfl: 3 .  dapagliflozin propanediol (FARXIGA) 10 MG TABS tablet, Take 10 mg by mouth daily. (Patient not taking: Reported on 06/05/2020), Disp: , Rfl:  .  nitroGLYCERIN (NITROSTAT) 0.4 MG SL tablet, DISSOLVE ONE TABLET UNDER THE TONGUE EVERY 5 MINUTES AS NEEDED FOR CHEST PAIN.  DO NOT EXCEED A TOTAL OF 3 DOSES IN 15 MINUTES, Disp: 25 tablet, Rfl: 0  Current Facility-Administered Medications:  .  sodium chloride flush (NS) 0.9 % injection 3 mL, 3 mL, Intravenous, Q12H, Ronald Commons, MD   Allergies  Allergen Reactions  . Bee Venom   . Fish Allergy Hives and Swelling  . Ivp Dye [Iodinated Diagnostic Agents] Swelling     Review of Systems  Constitutional: Negative.   Respiratory: Negative.  Negative for cough and shortness of breath.   Cardiovascular: Negative.  Negative for chest pain, palpitations and leg swelling.  Neurological: Negative for dizziness and headaches.  Psychiatric/Behavioral: Negative.      Today's Vitals   06/05/20 1418  BP: 134/70  Pulse: 74  Temp: 98 F (36.7 C)  TempSrc: Oral  SpO2: 98%   Weight: 159 lb (72.1 kg)  Height: '5\' 7"'  (1.702 m)  PainSc: 0-No pain   Body mass index is 24.9 kg/m.   Objective:  Physical Exam Vitals reviewed.  Constitutional:      General: He is not in acute distress.    Appearance: Normal appearance.  Cardiovascular:     Rate and Rhythm: Normal rate.     Pulses: Normal pulses.     Heart sounds: Normal heart sounds. No murmur heard.   Pulmonary:     Effort: Pulmonary effort is normal. No respiratory distress.  Breath sounds: Normal breath sounds. No wheezing.  Musculoskeletal:        General: No swelling or tenderness. Normal range of motion.  Neurological:     General: No focal deficit present.     Mental Status: He is alert and oriented to person, place, and time.     Cranial Nerves: No cranial nerve deficit.  Psychiatric:        Mood and Affect: Mood normal.        Behavior: Behavior normal.        Thought Content: Thought content normal.        Judgment: Judgment normal.         Assessment And Plan:     1. Type 2 diabetes mellitus without complication, without long-term current use of insulin (HCC)  Chronic, will check his HgbA1c  I have also refer to the eye doctor - CBC - CMP14+EGFR - Hemoglobin A1c - Lipid panel - Ambulatory referral to Ophthalmology  2. Hypertension, unspecified type  Chronic, good control  Continue with current medications - CMP14+EGFR  3. Coronary artery disease involving native coronary artery of native heart with angina pectoris ([place]) Chronic, he is being followed by cardiology  4. Acute bilateral low back pain without sciatica  Urinalysis is normal  He is going to have an xray done at the imaging center in [place] Urinalysis Dipstick (81002)  5. Fall, initial encounter [address] from his Conservation officer, nature a few months ago continues to have pain to his back Will obtain xray  7. History of diverticulitis  Chronic, I have given him stool cards and advised to call his GI provider    8. Establishing care with new doctor, encounter for     .    Patient was given opportunity to ask questions. Patient verbalized understanding of the plan and was able to repeat key elements of the plan. All questions were answered to their satisfaction.  Ronald Brine, FNP    I, Ronald Brine, FNP, have reviewed all documentation for this visit. The documentation on 06/12/20 for the exam, diagnosis, procedures, and orders are all accurate and complete.   THE PATIENT IS ENCOURAGED TO PRACTICE SOCIAL DISTANCING DUE TO THE COVID-19 PANDEMIC.

## 2020-06-06 ENCOUNTER — Telehealth: Payer: Self-pay

## 2020-06-06 LAB — CMP14+EGFR
ALT: 11 [IU]/L (ref 0–44)
AST: 15 [IU]/L (ref 0–40)
Albumin/Globulin Ratio: 1.4 (ref 1.2–2.2)
Albumin: 4.3 g/dL (ref 3.8–4.8)
Alkaline Phosphatase: 161 [IU]/L — ABNORMAL HIGH (ref 44–121)
BUN/Creatinine Ratio: 11 (ref 10–24)
BUN: 10 mg/dL (ref 8–27)
Bilirubin Total: 0.3 mg/dL (ref 0.0–1.2)
CO2: 23 mmol/L (ref 20–29)
Calcium: 9.7 mg/dL (ref 8.6–10.2)
Chloride: 105 mmol/L (ref 96–106)
Creatinine, Ser: 0.95 mg/dL (ref 0.76–1.27)
GFR calc Af Amer: 93 mL/min/{1.73_m2}
GFR calc non Af Amer: 81 mL/min/{1.73_m2}
Globulin, Total: 3 g/dL (ref 1.5–4.5)
Glucose: 105 mg/dL — ABNORMAL HIGH (ref 65–99)
Potassium: 3.7 mmol/L (ref 3.5–5.2)
Sodium: 143 mmol/L (ref 134–144)
Total Protein: 7.3 g/dL (ref 6.0–8.5)

## 2020-06-06 LAB — HEMOGLOBIN A1C
Est. average glucose Bld gHb Est-mCnc: 151 mg/dL
Hgb A1c MFr Bld: 6.9 % — ABNORMAL HIGH (ref 4.8–5.6)

## 2020-06-06 LAB — CBC
Hematocrit: 41.1 % (ref 37.5–51.0)
Hemoglobin: 13.4 g/dL (ref 13.0–17.7)
MCH: 26.6 pg (ref 26.6–33.0)
MCHC: 32.6 g/dL (ref 31.5–35.7)
MCV: 82 fL (ref 79–97)
Platelets: 260 10*3/uL (ref 150–450)
RBC: 5.04 x10E6/uL (ref 4.14–5.80)
RDW: 14.5 % (ref 11.6–15.4)
WBC: 6.7 10*3/uL (ref 3.4–10.8)

## 2020-06-06 LAB — LIPID PANEL
Chol/HDL Ratio: 2.8 ratio (ref 0.0–5.0)
Cholesterol, Total: 151 mg/dL (ref 100–199)
HDL: 53 mg/dL (ref >39)
LDL Chol Calc (NIH): 80 mg/dL (ref 0–99)
Triglycerides: 99 mg/dL (ref 0–149)
VLDL Cholesterol Cal: 18 mg/dL (ref 5–40)

## 2020-06-06 NOTE — Telephone Encounter (Signed)
-----   Message from [name], FNP sent at 06/06/2020  8:46 AM EST ----- Blood levels are normal. Kidney functions are normal, liver functions are normal.  Alkaline phos is elevated this can be elevated when eating increased amounts of processed foods. Also you need to cleanse your liver, increase your intake of broccoli/cauliflower and drink lemon water. Your HgbA1c is 6.9, we can start the Ozempic 0.25 mg weekly x 4 weeks then increase to 0.5 mg weekly.  Be sure to not overeat because you will get nauseated.  This medication can often cause weight loss so we will monitor this as well. We may eventually be able to stop the metformin as well. Cholesterol levels are normal.

## 2020-06-06 NOTE — Telephone Encounter (Signed)
Left the patient a message to call back for lab results. 

## 2020-06-21 ENCOUNTER — Telehealth: Payer: Self-pay

## 2020-06-21 NOTE — Telephone Encounter (Signed)
LVM for pt to call the office for x-ray results

## 2020-07-05 ENCOUNTER — Telehealth: Payer: Self-pay

## 2020-07-05 NOTE — Telephone Encounter (Signed)
Patient called requesting xray results.   I returned pt call and advised him that his xray result was normal Yl,RMA

## 2020-07-18 NOTE — Progress Notes (Deleted)
Cardiology Office Note  Date: 07/18/2020   ID: Ronald Faulkner, DOB [birth year], MRN [id number]  PCP:  [name], FNP  Cardiologist:  No primary care provider on file. Electrophysiologist:  None   Chief Complaint: Follow-up accelerating angina  History of Present Illness: Ronald Faulkner is a 70 y.o. male with a history of accelerating angina with history of CAD.  DES to mid and distal LAD 07/31/2017.  Moderately severe stenosis and small caliber intermediate branch and medical management was recommended.  Moderate stenosis in the distal RCA 50%.  Other medical history includes HTN and DM2 . Last saw Dr. Bronson Ing 10/26/2019.  He had been complaining the previous month of episodic chest pain lasting for approximately 5 seconds, occurring at rest, sometimes occurring every day, or every 2 or 3 days.  Having marked shortness of breath with exertion walking 20 to 30 feet from kitchen to the living room.  Short of breath when climbing a flight of stairs.  Feeling dizzy when bending over.  Used nitroglycerin on 2 separate occasions.  A cardiac catheterization was ordered.  HCTZ was stopped and losartan was reduced to 50 mg due to hypotension.  Last LDL 132 on 03/24/2018.  He did not tolerate atorvastatin 40 mg daily.  Currently taking Crestor 5 mg every Monday, Wednesday, Friday.  No longer on Zetia.  Still having some mild myalgia but not as severe as when taking Lipitor.   Status post cardiac catheterization 10/28/2019: Patent LAD mid and distal stent.  High-grade diffuse obstructive disease and branching relatively small RI, slight progression compared to 2019.  Widely patent circumflex for first obtuse marginal containing 50% proximal narrowing, widely patent dominant RCA with 30 to 40% narrowing.  EF 55%.  Continue current medical therapy recommended.   Patient here for follow-up status post cardiac catheterization.  We discussed the findings of cardiac catheterization.  Patient verbalizes  understanding.  He continues to have intermittent very short bursts of sharp chest pains lasting only a second or 2.  States it is not related to exertional activity.  States it can happen at any time.  Denies any radiation to the neck, arm, back, jaw.  Denies any associated nausea, vomiting, or diaphoresis.  States he has some occasional neck pain but also describes it as not related to activity.  Blood pressure is much better since stopping HCTZ and decreasing losartan.  Blood pressure today on arrival 138/84.  Patient states at home it usually in the 563-875 systolic range.  Denies any orthostatic symptoms, PND orthopnea, bleeding, claudication, DVT or PE-like symptoms, or lower extremity edema.  Past Medical History:  Diagnosis Date  . Arthritis   . CAD (coronary artery disease)    a. s/p DES to mid and distal LAD in 07/2017 with medical management recommended of intermediate branch  . Cancer [facility])    prostate  . CHF (congestive heart failure) ([place])   . Colon polyps   . Diabetes mellitus without complication ([place])   . Diverticulitis   . History of kidney stones   . Hypertension   . IBS (irritable bowel syndrome)   . 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 Unstable angina ([place]) 07/2017    Past Surgical History:  Procedure Laterality Date  . ABDOMINAL SURGERY    . APPENDECTOMY    . BREAST SURGERY     benign lump at lt side  . COLON SURGERY    . COLONOSCOPY  03/14/2011  Procedure: COLONOSCOPY;  Surgeon: [name], MD;  [facility];  Service: Endoscopy;  Laterality: N/A;  1:00  . COLONOSCOPY N/A 11/15/2015   Procedure: COLONOSCOPY;  Surgeon: [name], MD;  [facility];  Service: Endoscopy;  Laterality: N/A;  11:15  . COLONOSCOPY WITH PROPOFOL N/A 07/15/2019   Procedure: COLONOSCOPY WITH PROPOFOL;  Surgeon: [name], MD;  [facility];  Service: Endoscopy;  Laterality: N/A;  1235  . CORONARY STENT  INTERVENTION N/A 07/31/2017   Procedure: CORONARY STENT INTERVENTION;  Surgeon: [name], MD;  Location: [place] [facility];  Service: Cardiovascular;  Laterality: N/A;  . LEFT HEART CATH AND CORONARY ANGIOGRAPHY N/A 07/31/2017   Procedure: LEFT HEART CATH AND CORONARY ANGIOGRAPHY;  Surgeon: [name], MD;  Location: [place] [facility];  Service: Cardiovascular;  Laterality: N/A;  . LEFT HEART CATH AND CORONARY ANGIOGRAPHY N/A 10/28/2019   Procedure: LEFT HEART CATH AND CORONARY ANGIOGRAPHY;  Surgeon: Belva Crome, MD;  Location: [place] [facility];  Service: Cardiovascular;  Laterality: N/A;  . PENILE PROSTHESIS IMPLANT     2016   . prostate cancer     2015. prostectomy    Current Outpatient Medications  Medication Sig Dispense Refill  . allopurinol (ZYLOPRIM) 300 MG tablet Take 300 mg by mouth daily.     Marland Kitchen aspirin EC 81 MG tablet Take 81 mg by mouth daily.    . dapagliflozin propanediol (FARXIGA) 10 MG TABS tablet Take 10 mg by mouth daily. (Patient not taking: Reported on 06/05/2020)    . diphenhydrAMINE (BENADRYL) 50 MG tablet Take 1 tablet (50 mg total) by mouth as needed (once per cath instructions). 1 tablet 0  . isosorbide mononitrate (IMDUR) 60 MG 24 hr tablet Take 1 tablet (60 mg total) by mouth daily. 90 tablet 1  . linaclotide (LINZESS) 72 MCG capsule Take 72 mcg by mouth every other day.    . losartan (COZAAR) 50 MG tablet Take 1 tablet (50 mg total) by mouth daily. 30 tablet 6  . metFORMIN (GLUCOPHAGE) 500 MG tablet Take 1 tablet (500 mg total) by mouth 2 (two) times daily with a meal.    . metoprolol succinate (TOPROL-XL) 25 MG 24 hr tablet TAKE 1 & 1/2 (ONE & ONE-HALF) TABLETS BY MOUTH ONCE DAILY 135 tablet 2  . nitroGLYCERIN (NITROSTAT) 0.4 MG SL tablet DISSOLVE ONE TABLET UNDER THE TONGUE EVERY 5 MINUTES AS NEEDED FOR CHEST PAIN.  DO NOT EXCEED A TOTAL OF 3 DOSES IN 15 MINUTES 25 tablet 0  . oxymetazoline (AFRIN) 0.05 % nasal spray Place 1 spray into  both nostrils 2 (two) times daily as needed for congestion.     . pantoprazole (PROTONIX) 40 MG tablet TAKE 1 TABLET BY MOUTH ONCE DAILY BEFORE BREAKFAST 90 tablet 1  . rosuvastatin (CRESTOR) 5 MG tablet BY MOUTH ON MONDAY,&wEDNESDAY & FRIDAY 36 tablet 3   Current Facility-Administered Medications  Medication Dose Route Frequency Provider Last Rate Last Admin  . sodium chloride flush (NS) 0.9 % injection 3 mL  3 mL Intravenous Q12H Herminio Commons, MD       Allergies:  Bee venom, Fish allergy, and Ivp dye [iodinated diagnostic agents]   Social History: The patient  reports that he quit smoking about 21 years ago. His smoking use included cigarettes. He has a 35.00 pack-year smoking history. He has never used smokeless tobacco. He reports current alcohol use. He reports that he does not  use drugs.   Family History: The patient's family history includes Colon cancer in his mother; Coronary artery disease in his maternal grandmother.   ROS:  Please see the history of present illness. Otherwise, complete review of systems is positive for none.  All other systems are reviewed and negative.   Physical Exam: VS:  There were no vitals taken for this visit., BMI There is no height or weight on file to calculate BMI.  Wt Readings from Last 3 Encounters:  06/05/20 159 lb (72.1 kg)  11/25/19 165 lb (74.8 kg)  10/28/19 160 lb (72.6 kg)    General: Patient appears comfortable at rest. Neck: Supple, no elevated JVP or carotid bruits, no thyromegaly. Lungs: Clear to auscultation, nonlabored breathing at rest. Cardiac: Regular rate and rhythm, no S3 or significant systolic murmur, no pericardial rub. Extremities: No pitting edema, distal pulses 2+. Skin: Warm and dry. Musculoskeletal: No kyphosis. Neuropsychiatric: Alert and oriented x3, affect grossly appropriate.  ECG:  EKG on 10/26/2019 sinus rhythm with right bundle branch block possible RVH, consider pulmonary disease.  Heart rate  99  Recent Labwork: 06/05/2020: ALT 11; AST 15; BUN 10; Creatinine, Ser 0.95; Hemoglobin 13.4; Platelets 260; Potassium 3.7; Sodium 143     Component Value Date/Time   CHOL 151 06/05/2020 1535   TRIG 99 06/05/2020 1535   HDL 53 06/05/2020 1535   CHOLHDL 2.8 06/05/2020 1535   CHOLHDL 4.3 05/27/2017 0844   VLDL 51 (H) 05/27/2017 0844   LDLCALC 80 06/05/2020 1535    Other Studies Reviewed Today:  Cardiac cath 10/28/2019   Patent left main  Patent LAD mid and distal stent.  High-grade diffuse obstructive disease in a branching relatively small ramus intermedius.  Slight progression compared to 2019.  Widely patent circumflex with first obtuse marginal containing a 50% proximal narrowing  Widely patent dominant right coronary with 30 to 40% narrowing distally.  Normal LV function with EF 55%.  LVEDP 20 mmHg.  RECOMMENDATIONS:   Continue current therapy  Further medication adjustments and management per Dr.,[place] Dominance: Co-dominant  Intervention    Echocardiogram 10/13/2018 IMPRESSIONS    1. The left ventricle has normal systolic function with an ejection  fraction of 60-65%. The cavity size was normal. Left ventricular diastolic  Doppler parameters are consistent with impaired relaxation. No evidence of  left ventricular regional wall  motion abnormalities.  2. The right ventricle has normal systolic function. The cavity was  normal. There is no increase in right ventricular wall thickness.  3. The aortic valve is tricuspid. Mild aortic annular calcification  noted.  4. The mitral valve is grossly normal. There is mild mitral annular  calcification present.  5. The tricuspid valve is grossly normal.  6. The aortic root is normal in size and structure   Assessment and Plan:   1. CAD in native artery Status post cardiac catheterization 10/28/2019: Patent LAD mid and distal stent.  High-grade diffuse obstructive disease and branching relatively  small RI, slight progression compared to 2019.  Widely patent circumflex  first obtuse marginal containing 50% proximal narrowing, widely patent dominant RCA with 30 to 40% narrowing.  EF 55%.  Patient complains of occasional very short bursts of sharp chest pain lasting only a second or 2.  Denies any radiation to the neck, arm, back, jaw.  Continue aspirin 81 mg daily, Imdur 60 mg daily, Toprol-XL 25 mg daily, nitroglycerin sublingual as needed  2. Essential hypertension Blood pressure well controlled at home.  Slightly elevated today  at 138/84.  Patient states that home systolic blood pressures run from 115-120.  Continue losartan 50 mg.  4. Hyperlipidemia LDL goal <70 Last recorded lipid panel and iron records 05/27/2017 showed TC 195, TG 253, HDL 45, LDL 99.  Continue Crestor 5 mg on Mondays, Wednesdays, and Fridays.   Medication Adjustments/Labs and Tests Ordered: Current medicines are reviewed at length with the patient today.  Concerns regarding medicines are outlined above.   Disposition: Follow-up with Dr. Bronson Ing or APP 6 months. Signed, Levell July, NP 07/18/2020 10:43 PM    [facility] [place], [place], [place] [address] Phone: [phone]; Fax: [phone]

## 2020-07-19 ENCOUNTER — Ambulatory Visit: Payer: Medicare HMO | Admitting: Family Medicine

## 2020-07-23 ENCOUNTER — Other Ambulatory Visit: Payer: Self-pay

## 2020-07-23 ENCOUNTER — Ambulatory Visit (INDEPENDENT_AMBULATORY_CARE_PROVIDER_SITE_OTHER): Payer: Medicare HMO | Admitting: Nurse Practitioner

## 2020-07-23 ENCOUNTER — Encounter: Payer: Self-pay | Admitting: Nurse Practitioner

## 2020-07-23 VITALS — BP 170/98 | HR 81 | Temp 98.1°F | Ht 67.0 in | Wt 160.0 lb

## 2020-07-23 DIAGNOSIS — R0981 Nasal congestion: Secondary | ICD-10-CM

## 2020-07-23 DIAGNOSIS — I10 Essential (primary) hypertension: Secondary | ICD-10-CM

## 2020-07-23 MED ORDER — LOSARTAN POTASSIUM 50 MG PO TABS: ORAL_TABLET | ORAL | 6 refills | Status: DC

## 2020-07-23 MED ORDER — MOMETASONE FUROATE 50 MCG/ACT NA SUSP: 2.0000 | Freq: Every day | NASAL | 2 refills | Status: DC

## 2020-07-23 MED ORDER — FLUTICASONE PROPIONATE 50 MCG/ACT NA SUSP: 2.0000 | Freq: Every day | NASAL | 2 refills | Status: DC

## 2020-07-23 NOTE — Patient Instructions (Signed)

## 2020-07-23 NOTE — Progress Notes (Signed)
I,Yamilka Roman [employer] as a Education administrator for [employer], FNP.,have documented all relevant documentation on the behalf of [name], FNP,as directed by  [name], FNP while in the presence of [name], [place]. This visit occurred during the SARS-CoV-2 public health emergency.  Safety protocols were in place, including screening questions prior to the visit, additional usage of staff PPE, and extensive cleaning of exam room while observing appropriate contact time as indicated for disinfecting solutions.  Subjective:     Patient ID: Ronald Faulkner , male    DOB: [birth year] , 70 y.o.   MRN: [id number]   Chief Complaint  Patient presents with  . Hypertension    HPI  Patient presents today for a blood pressure f/u.  He has been having elevated blood pressures over the last week. Denies missing any doses of medications.  Denies having experiencing elevated blood pressure before.  Every now and then he will use a nasal spray.   He has an appt with the Cardiologist in [place].  He was taking 100 mg losartan until his blood pressure started dropping low and was changed to 50 mg daily.  He thinks his blood pressure was as low as 80/40s.    Hypertension This is a chronic problem. The current episode started 1 to 4 weeks ago. The problem has been gradually worsening since onset. The problem is uncontrolled. Pertinent negatives include no anxiety, blurred vision, chest pain, headaches, malaise/fatigue or peripheral edema.     Past Medical History:  Diagnosis Date  . Arthritis   . CAD (coronary artery disease)    a. s/p DES to mid and distal LAD in 07/2017 with medical management recommended of intermediate branch  . Cancer [facility])    prostate  . CHF (congestive heart failure) ([place])   . Colon polyps   . Diabetes mellitus without complication ([place])   . Diverticulitis   . History of kidney stones   . Hypertension   . IBS (irritable bowel syndrome)   . 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 Unstable angina ([place]) 07/2017     Family History  Problem Relation Age of Onset  . Colon cancer Mother   . Coronary artery disease Maternal Grandmother   . Anesthesia problems Neg Hx   . Hypotension Neg Hx   . Malignant hyperthermia Neg Hx   . Pseudochol deficiency Neg Hx      Current Outpatient Medications:  .  allopurinol (ZYLOPRIM) 300 MG tablet, Take 300 mg by mouth daily. , Disp: , Rfl:  .  aspirin EC 81 MG tablet, Take 81 mg by mouth daily., Disp: , Rfl:  .  diphenhydrAMINE (BENADRYL) 50 MG tablet, Take 1 tablet (50 mg total) by mouth as needed (once per cath instructions)., Disp: 1 tablet, Rfl: 0 .  isosorbide mononitrate (IMDUR) 60 MG 24 hr tablet, Take 1 tablet (60 mg total) by mouth daily., Disp: 90 tablet, Rfl: 1 .  linaclotide (LINZESS) 72 MCG capsule, Take 72 mcg by mouth every other day., Disp: , Rfl:  .  metFORMIN (GLUCOPHAGE) 500 MG tablet, Take 1 tablet (500 mg total) by mouth 2 (two) times daily with a meal., Disp: , Rfl:  .  metoprolol succinate (TOPROL-XL) 25 MG 24 hr tablet, TAKE 1 & 1/2 (ONE & ONE-HALF) TABLETS BY MOUTH ONCE DAILY, Disp: 135 tablet, Rfl: 2 .  mometasone (NASONEX) 50 MCG/ACT nasal spray, Place 2 sprays into the nose daily., Disp: 1 each, Rfl: 2 .  nitroGLYCERIN (NITROSTAT) 0.4 MG SL tablet, DISSOLVE ONE TABLET UNDER THE TONGUE EVERY 5 MINUTES AS NEEDED FOR CHEST PAIN.  DO NOT EXCEED A TOTAL OF 3 DOSES IN 15 MINUTES, Disp: 25 tablet, Rfl: 0 .  oxymetazoline (AFRIN) 0.05 % nasal spray, Place 1 spray into both nostrils 2 (two) times daily as needed for congestion. , Disp: , Rfl:  .  pantoprazole (PROTONIX) 40 MG tablet, TAKE 1 TABLET BY MOUTH ONCE DAILY BEFORE BREAKFAST, Disp: 90 tablet, Rfl: 1 .  rosuvastatin (CRESTOR) 5 MG tablet, BY MOUTH ON MONDAY,&wEDNESDAY & FRIDAY, Disp: 36 tablet, Rfl: 3 .  Insulin Pen Needle (PEN NEEDLES) 31G X 6 MM MISC, Use as directed with ozempic, Disp: 50 each, Rfl: 2 .  Insulin Pen  Needle 32G X 6 MM MISC, Use with ozempic, Disp: 50 each, Rfl: 3 .  losartan (COZAAR) 50 MG tablet, Take 1 tablet by mouth am and 1/2 tab by mouth pm, Disp: 30 tablet, Rfl: 6 .  Semaglutide,0.25 or 0.5MG /DOS, (OZEMPIC, 0.25 OR 0.5 MG/DOSE,) 2 MG/1.5ML SOPN, Inject 0.25 mg into the skin once a week., Disp: 6 mL, Rfl: 3  Current Facility-Administered Medications:  .  sodium chloride flush (NS) 0.9 % injection 3 mL, 3 mL, Intravenous, Q12H, Herminio Commons, MD   Allergies  Allergen Reactions  . Bee Venom   . Fish Allergy Hives and Swelling  . Ivp Dye [Iodinated Diagnostic Agents] Swelling     Review of Systems  Constitutional: Negative.  Negative for malaise/fatigue.  HENT: Negative.   Eyes: Negative.  Negative for blurred vision.  Respiratory: Negative.   Cardiovascular: Negative.  Negative for chest pain.  Gastrointestinal: Negative.   Endocrine: Negative.   Genitourinary: Negative.   Musculoskeletal: Negative.   Skin: Negative.   Neurological: Negative.  Negative for headaches.  Hematological: Negative.   Psychiatric/Behavioral: Negative.      Today's Vitals   07/23/20 1453 07/23/20 1454  BP: (!) 166/96 (!) 170/98  Pulse: 85 81  Temp: 98.1 F (36.7 C)   TempSrc: Oral   Weight: 160 lb (72.6 kg)   Height: 5\' 7"  (1.702 m)   PainSc: 0-No pain    Body mass index is 25.06 kg/m.   Objective:  Physical Exam Constitutional:      General: He is not in acute distress.    Appearance: Normal appearance. He is normal weight.  Cardiovascular:     Rate and Rhythm: Normal rate and regular rhythm.     Pulses: Normal pulses.     Heart sounds: Normal heart sounds.  Pulmonary:     Effort: Pulmonary effort is normal.     Breath sounds: Normal breath sounds.  Abdominal:     General: Abdomen is flat. Bowel sounds are normal.     Palpations: Abdomen is soft.  Musculoskeletal:        General: Normal range of motion.     Cervical back: Normal range of motion.  Skin:     General: Skin is warm and dry.     Capillary Refill: Capillary refill takes less than 2 seconds.  Neurological:     General: No focal deficit present.     Mental Status: He is alert and oriented to person, place, and time.  Psychiatric:        Mood and Affect: Mood normal.        Behavior: Behavior normal.        Thought Content: Thought content normal.        Judgment: Judgment  normal.         Assessment And Plan:     1. Uncontrolled hypertension  Blood pressure is still elevated  Will add back the losartan and he is to monitor his blood pressure  Encouraged to avoid high salt diet - losartan (COZAAR) 50 MG tablet; Take 1 tablet by mouth am and 1/2 tab by mouth pm  Dispense: 30 tablet; Refill: 6  2. Nasal congestion  Will try nasal spray to help with nasal congestion which is safe with blood pressure - mometasone (NASONEX) 50 MCG/ACT nasal spray; Place 2 sprays into the nose daily.  Dispense: 1 each; Refill: 2     Patient was given opportunity to ask questions. Patient verbalized understanding of the plan and was able to repeat key elements of the plan. All questions were answered to their satisfaction.  [name], FNP   I, [name], FNP, have reviewed all documentation for this visit. The documentation on 07/23/20 for the exam, diagnosis, procedures, and orders are all accurate and complete.   THE PATIENT IS ENCOURAGED TO PRACTICE SOCIAL DISTANCING DUE TO THE COVID-19 PANDEMIC.

## 2020-07-26 NOTE — Progress Notes (Signed)
Cardiology Office Note  Date: 07/26/2020   ID: Ronald, Faulkner 1950-10-25, MRN [id number]  PCP:  [name], FNP  Cardiologist:  No primary care provider on file. Electrophysiologist:  None   Chief Complaint: 71-month follow-up  History of Present Illness: Ronald Faulkner is a 70 y.o. male with a history of accelerating angina with history of CAD.  DES to mid and distal LAD 07/31/2017.  Moderately severe stenosis and small caliber intermediate branch and medical management was recommended.  Moderate stenosis in the distal RCA 50%.  Other medical history includes HTN and DM2 . Last saw Dr. Bronson Ing 10/26/2019.  He had been complaining the previous month of episodic chest pain lasting for approximately 5 seconds, occurring at rest, sometimes occurring every day, or every 2 or 3 days.  Having marked shortness of breath with exertion walking 20 to 30 feet from kitchen to the living room.  Short of breath when climbing a flight of stairs.  Feeling dizzy when bending over.  Used nitroglycerin on 2 separate occasions.  A cardiac catheterization was ordered.  HCTZ was stopped and losartan was reduced to 50 mg due to hypotension.  Last LDL 132 on 03/24/2018.  He did not tolerate atorvastatin 40 mg daily.  Currently taking Crestor 5 mg every Monday, Wednesday, Friday.  No longer on Zetia.  Still having some mild myalgia but not as severe as when taking Lipitor.   He is here for 71-month follow-up today.  Recently saw primary care provider who increased his losartan to 75 mg daily due to elevated blood pressure.  Blood pressure has improved since seeing PCP with medication adjustment.  Blood pressure today 124/92.  He denies any classic anginal symptoms.  States he will have a brief instantaneous chest pain which lasts only a second not related to activity with no radiation or associated nausea, vomiting, diaphoresis.  States he has been noticing increasing dyspnea on exertion.  States he recently went to  [employer] park and did some walking with his wife and noticing increasing shortness of breath..  States he has been noticing this for a while now.    Past Medical History:  Diagnosis Date  . Arthritis   . CAD (coronary artery disease)    a. s/p DES to mid and distal LAD in 07/2017 with medical management recommended of intermediate branch  . Cancer [facility])    prostate  . CHF (congestive heart failure) ([place])   . Colon polyps   . Diabetes mellitus without complication ([place])   . Diverticulitis   . History of kidney stones   . Hypertension   . IBS (irritable bowel syndrome)   . 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 Unstable angina ([place]) 07/2017    Past Surgical History:  Procedure Laterality Date  . ABDOMINAL SURGERY    . APPENDECTOMY    . BREAST SURGERY     benign lump at lt side  . COLON SURGERY    . COLONOSCOPY  03/14/2011   Procedure: COLONOSCOPY;  Surgeon: [name], MD;  [facility];  Service: Endoscopy;  Laterality: N/A;  1:00  . COLONOSCOPY N/A 11/15/2015   Procedure: COLONOSCOPY;  Surgeon: [name], MD;  [facility];  Service: Endoscopy;  Laterality: N/A;  11:15  . COLONOSCOPY WITH PROPOFOL N/A 07/15/2019   Procedure: COLONOSCOPY WITH PROPOFOL;  Surgeon: [name], MD;  Location: [facility];  Service: Endoscopy;  Laterality: N/A;  1235  . CORONARY STENT INTERVENTION N/A 07/31/2017   Procedure: CORONARY STENT INTERVENTION;  Surgeon: [name], MD;  Location: [place] CV LAB;  Service: Cardiovascular;  Laterality: N/A;  . LEFT HEART CATH AND CORONARY ANGIOGRAPHY N/A 07/31/2017   Procedure: LEFT HEART CATH AND CORONARY ANGIOGRAPHY;  Surgeon: [name], MD;  Location: [place] CV LAB;  Service: Cardiovascular;  Laterality: N/A;  . LEFT HEART CATH AND CORONARY ANGIOGRAPHY N/A 10/28/2019   Procedure: LEFT HEART CATH AND CORONARY ANGIOGRAPHY;  Surgeon: Belva Crome, MD;  Location: [place] CV LAB;  Service: Cardiovascular;  Laterality: N/A;  . PENILE PROSTHESIS IMPLANT     2016   . prostate cancer     2015. prostectomy    Current Outpatient Medications  Medication Sig Dispense Refill  . allopurinol (ZYLOPRIM) 300 MG tablet Take 300 mg by mouth daily.     Marland Kitchen aspirin EC 81 MG tablet Take 81 mg by mouth daily.    . dapagliflozin propanediol (FARXIGA) 10 MG TABS tablet Take 10 mg by mouth daily. (Patient not taking: No sig reported)    . diphenhydrAMINE (BENADRYL) 50 MG tablet Take 1 tablet (50 mg total) by mouth as needed (once per cath instructions). 1 tablet 0  . isosorbide mononitrate (IMDUR) 60 MG 24 hr tablet Take 1 tablet (60 mg total) by mouth daily. 90 tablet 1  . linaclotide (LINZESS) 72 MCG capsule Take 72 mcg by mouth every other day.    . losartan (COZAAR) 50 MG tablet Take 1 tablet by mouth am and 1/2 tab by mouth pm 30 tablet 6  . metFORMIN (GLUCOPHAGE) 500 MG tablet Take 1 tablet (500 mg total) by mouth 2 (two) times daily with a meal.    . metoprolol succinate (TOPROL-XL) 25 MG 24 hr tablet TAKE 1 & 1/2 (ONE & ONE-HALF) TABLETS BY MOUTH ONCE DAILY 135 tablet 2  . mometasone (NASONEX) 50 MCG/ACT nasal spray Place 2 sprays into the nose daily. 1 each 2  . nitroGLYCERIN (NITROSTAT) 0.4 MG SL tablet DISSOLVE ONE TABLET UNDER THE TONGUE EVERY 5 MINUTES AS NEEDED FOR CHEST PAIN.  DO NOT EXCEED A TOTAL OF 3 DOSES IN 15 MINUTES 25 tablet 0  . oxymetazoline (AFRIN) 0.05 % nasal spray Place 1 spray into both nostrils 2 (two) times daily as needed for congestion.     . pantoprazole (PROTONIX) 40 MG tablet TAKE 1 TABLET BY MOUTH ONCE DAILY BEFORE BREAKFAST 90 tablet 1  . rosuvastatin (CRESTOR) 5 MG tablet BY MOUTH ON MONDAY,&wEDNESDAY & FRIDAY 36 tablet 3   Current Facility-Administered Medications  Medication Dose Route Frequency Provider Last Rate Last Admin  . sodium chloride flush (NS) 0.9 % injection 3 mL  3 mL Intravenous Q12H  Herminio Commons, MD       Allergies:  Bee venom, Fish allergy, and Ivp dye [iodinated diagnostic agents]   Social History: The patient  reports that he quit smoking about 21 years ago. His smoking use included cigarettes. He has a 35.00 pack-year smoking history. He has never used smokeless tobacco. He reports current alcohol use. He reports that he does not use drugs.   Family History: The patient's family history includes Colon cancer in his mother; Coronary artery disease in his maternal grandmother.   ROS:  Please see the history of present illness. Otherwise, complete review of systems is positive for none.  All other systems are reviewed and negative.   Physical Exam: VS:  There were no vitals taken for this visit., BMI There is no height or weight on file to calculate BMI.  Wt Readings from Last 3 Encounters:  07/23/20 160 lb (72.6 kg)  06/05/20 159 lb (72.1 kg)  11/25/19 165 lb (74.8 kg)    General: Patient appears comfortable at rest. Neck: Supple, no elevated JVP or carotid bruits, no thyromegaly. Lungs: Clear to auscultation, nonlabored breathing at rest. Cardiac: Regular rate and rhythm, no S3 or significant systolic murmur, no pericardial rub. Extremities: No pitting edema, distal pulses 2+. Skin: Warm and dry. Musculoskeletal: No kyphosis. Neuropsychiatric: Alert and oriented x3, affect grossly appropriate.  ECG:  EKG on 10/26/2019 sinus rhythm with right bundle branch block possible RVH, consider pulmonary disease.  Heart rate 99  Recent Labwork: 06/05/2020: ALT 11; AST 15; BUN 10; Creatinine, Ser 0.95; Hemoglobin 13.4; Platelets 260; Potassium 3.7; Sodium 143     Component Value Date/Time   CHOL 151 06/05/2020 1535   TRIG 99 06/05/2020 1535   HDL 53 06/05/2020 1535   CHOLHDL 2.8 06/05/2020 1535   CHOLHDL 4.3 05/27/2017 0844   VLDL 51 (H) 05/27/2017 0844   LDLCALC 80 06/05/2020 1535    Other Studies Reviewed Today:  Cardiac cath 10/28/2019   Patent left  main  Patent LAD mid and distal stent.  High-grade diffuse obstructive disease in a branching relatively small ramus intermedius.  Slight progression compared to 2019.  Widely patent circumflex with first obtuse marginal containing a 50% proximal narrowing  Widely patent dominant right coronary with 30 to 40% narrowing distally.  Normal LV function with EF 55%.  LVEDP 20 mmHg.  RECOMMENDATIONS:   Continue current therapy  Further medication adjustments and management per Dr.,[place] Dominance: Co-dominant  Intervention    Echocardiogram 10/13/2018 IMPRESSIONS  1. The left ventricle has normal systolic function with an ejection  fraction of 60-65%. The cavity size was normal. Left ventricular diastolic  Doppler parameters are consistent with impaired relaxation. No evidence of  left ventricular regional wall  motion abnormalities.  2. The right ventricle has normal systolic function. The cavity was  normal. There is no increase in right ventricular wall thickness.  3. The aortic valve is tricuspid. Mild aortic annular calcification  noted.  4. The mitral valve is grossly normal. There is mild mitral annular  calcification present.  5. The tricuspid valve is grossly normal.  6. The aortic root is normal in size and structure   Assessment and Plan:   1. CAD in native artery Status post cardiac catheterization 10/28/2019: Patent LAD mid and distal stent.  High-grade diffuse obstructive disease and branching relatively small RI, slight progression compared to 2019.  Widely patent circumflex  first obtuse marginal containing 50% proximal narrowing, widely patent dominant RCA with 30 to 40% narrowing.  EF 55%.  Patient complains of occasional very short bursts of sharp chest pain lasting only a second or 2.  Denies any radiation to the neck, arm, back, jaw.  Continue aspirin 81 mg daily, Imdur 60 mg daily, Toprol-XL 25 mg daily, nitroglycerin sublingual as needed  2.  Essential hypertension Recent elevated blood pressures.  Elevated at PCP office.  PCP increased  losartan to 75 mg daily.  Blood pressure today on arrival 124/92.  Continue losartan 75 mg.  Continue Toprol-XL 25 mg daily.  4. Hyperlipidemia LDL goal <70 Recent lipid panel at PCP office on 06/05/2020: TC 151, TG 99, HDL 53, LDL 80.  Continue Crestor 5 mg on Mondays, Wednesdays, and Fridays.  5.  DOE Patient states he has been noticing some increasing shortness of breath/dyspnea on exertion..  States it seems to have progressed over the last month or so.  Please get a repeat echocardiogram to assess LV function, diastolic function, and valvular function.   Medication Adjustments/Labs and Tests Ordered: Current medicines are reviewed at length with the patient today.  Concerns regarding medicines are outlined above.   Disposition: Follow-up with Dr. Harl Bowie or APP 1 month  Signed, Levell July, NP 07/26/2020 9:20 PM    [facility], [place], [place] [address] Phone: [phone]; Fax: [phone]

## 2020-07-27 ENCOUNTER — Other Ambulatory Visit: Payer: Self-pay

## 2020-07-27 ENCOUNTER — Ambulatory Visit: Payer: Medicare HMO | Admitting: Family Medicine

## 2020-07-27 ENCOUNTER — Encounter: Payer: Self-pay | Admitting: Family Medicine

## 2020-07-27 VITALS — BP 124/92 | HR 93 | Ht 67.0 in | Wt 159.4 lb

## 2020-07-27 DIAGNOSIS — R0602 Shortness of breath: Secondary | ICD-10-CM

## 2020-07-27 NOTE — Patient Instructions (Signed)
Your physician recommends that you schedule a follow-up appointment in: Winter, NP  Your physician recommends that you continue on your current medications as directed. Please refer to the Current Medication list given to you today.  Your physician has requested that you have an echocardiogram. Echocardiography is a painless test that uses sound waves to create images of your heart. It provides your doctor with information about the size and shape of your heart and how well your heart's chambers and valves are working. This procedure takes approximately one hour. There are no restrictions for this procedure.   Thank you for choosing [facility]!!

## 2020-07-31 ENCOUNTER — Ambulatory Visit (INDEPENDENT_AMBULATORY_CARE_PROVIDER_SITE_OTHER): Payer: Medicare HMO

## 2020-07-31 DIAGNOSIS — R0602 Shortness of breath: Secondary | ICD-10-CM | POA: Diagnosis not present

## 2020-07-31 LAB — ECHOCARDIOGRAM COMPLETE
Area-P 1/2: 2.79 cm2
Calc EF: 60.2 %
S' Lateral: 2.73 cm
Single Plane A2C EF: 57.9 %
Single Plane A4C EF: 61 %

## 2020-08-02 ENCOUNTER — Telehealth: Payer: Self-pay | Admitting: *Deleted

## 2020-08-02 NOTE — Telephone Encounter (Signed)
-----   Message from Verta Ellen., NP sent at 08/01/2020 10:25 PM EST ----- Please call the patient and let him know the echocardiogram showed the pumping function of his heart is good.  Heart is a little stiff when trying to relax.  Best treatment is to manage blood pressure to keep it at or below 130/80 and manage all other risk factors.  Has a very mildly leaking valve on the left side.  This is common with aging.

## 2020-08-02 NOTE — Telephone Encounter (Signed)
Laurine Blazer, LPN  02/07/3531 9:92 PM EST Back to Top     Notified, copy to pcp.

## 2020-08-10 ENCOUNTER — Other Ambulatory Visit: Payer: Self-pay

## 2020-08-10 MED ORDER — OZEMPIC (0.25 OR 0.5 MG/DOSE) 2 MG/1.5ML ~~LOC~~ SOPN: 0.2500 mg | PEN_INJECTOR | SUBCUTANEOUS | 3 refills | Status: DC

## 2020-08-10 MED ORDER — INSULIN PEN NEEDLE 32G X 6 MM MISC: 3 refills | Status: AC

## 2020-08-13 ENCOUNTER — Other Ambulatory Visit (HOSPITAL_COMMUNITY): Payer: Medicare HMO

## 2020-08-13 ENCOUNTER — Other Ambulatory Visit: Payer: Self-pay

## 2020-08-13 MED ORDER — PEN NEEDLES 31G X 6 MM MISC: 2 refills | Status: DC

## 2020-08-14 ENCOUNTER — Encounter: Payer: Self-pay | Admitting: Nurse Practitioner

## 2020-08-16 ENCOUNTER — Ambulatory Visit: Payer: Medicare HMO

## 2020-08-22 ENCOUNTER — Encounter: Payer: Self-pay | Admitting: Nurse Practitioner

## 2020-08-22 LAB — HM DIABETES EYE EXAM

## 2020-08-23 ENCOUNTER — Other Ambulatory Visit: Payer: Self-pay

## 2020-08-23 MED ORDER — METFORMIN HCL 500 MG PO TABS: 500.0000 mg | ORAL_TABLET | Freq: Two times a day (BID) | ORAL | 1 refills | Status: DC

## 2020-08-23 NOTE — Progress Notes (Unsigned)
Cardiology Office Note  Date: 08/24/2020   ID: Faulkner, Ronald 04-09-1951, MRN [id number]  PCP:  [name], FNP  Cardiologist:  No primary care provider on file. Electrophysiologist:  None   Chief Complaint: 64-month follow-up  History of Present Illness: Ronald Faulkner is a 70 y.o. male with a history of accelerating angina with history of CAD.  DES to mid and distal LAD 07/31/2017.  Moderately severe stenosis and small caliber intermediate branch and medical management was recommended.  Moderate stenosis in the distal RCA 50%.  Other medical history includes HTN and DM2 . Last saw Dr. Bronson Ing 10/26/2019.  He had been complaining the previous month of episodic chest pain lasting for approximately 5 seconds, occurring at rest, sometimes occurring every day, or every 2 or 3 days.  Having marked shortness of breath with exertion walking 20 to 30 feet from kitchen to the living room.  Short of breath when climbing a flight of stairs.  Feeling dizzy when bending over.  Used nitroglycerin on 2 separate occasions.  A cardiac catheterization was ordered.  HCTZ was stopped and losartan was reduced to 50 mg due to hypotension.  Last LDL 132 on 03/24/2018.  He did not tolerate atorvastatin 40 mg daily.  Currently taking Crestor 5 mg every Monday, Wednesday, Friday.  No longer on Zetia.  Still having some mild myalgia but not as severe as when taking Lipitor.   Last year for 68-month follow-up.  Had recently seen primary care provider who increased his losartan to 75 mg daily due to elevated blood pressure.  Blood pressure had improved.  Stated he would have a brief instantaneous chest pain which lasted only a second not related to activity with no radiation or associated nausea, vomiting, diaphoresis.  Stated he had been noticing increasing dyspnea on exertion.  States he recently went to [employer] park and did some walking with his wife and noticing increasing shortness of breath..  Echocardiogram was ordered.  He is here for follow-up today.  Reviewed the echo cardiogram results with him.  Echo demonstrated EF of 60 to 65%.  No WMA's, G1 DD.  Trivial MR.  Blood pressure is elevated today.  He states his blood pressure has been elevated at home usually in the 353G systolic to high 99M diastolic.  Initial blood pressure 160/104.  Recheck in right arm 160/98.  He denies any shortness of breath today.  Denies any anginal symptoms other than above-mentioned fleeting chest pain without radiation or associated symptoms.  Denies any palpitations or arrhythmias, orthostatic symptoms, CVA or TIA-like symptoms, PND, [place]ne, bleeding, claudication, DVT or PE-like symptoms, lower extremity edema.    Past Medical History:  Diagnosis Date  . Arthritis   . CAD (coronary artery disease)    a. s/p DES to mid and distal LAD in 07/2017 with medical management recommended of intermediate branch  . Cancer [facility])    prostate  . CHF (congestive heart failure) ([place])   . Colon polyps   . Diabetes mellitus without complication ([place])   . Diverticulitis   . History of kidney stones   . Hypertension   . IBS (irritable bowel syndrome)   . 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 Unstable angina ([place]) 07/2017    Past Surgical History:  Procedure Laterality Date  . ABDOMINAL SURGERY    . APPENDECTOMY    . BREAST SURGERY     benign lump at lt side  .  COLON SURGERY    . COLONOSCOPY  03/14/2011   Procedure: COLONOSCOPY;  Surgeon: [name], MD;  [facility];  Service: Endoscopy;  Laterality: N/A;  1:00  . COLONOSCOPY N/A 11/15/2015   Procedure: COLONOSCOPY;  Surgeon: [name], MD;  [facility];  Service: Endoscopy;  Laterality: N/A;  11:15  . COLONOSCOPY WITH PROPOFOL N/A 07/15/2019   Procedure: COLONOSCOPY WITH PROPOFOL;  Surgeon: [name], MD;  [facility];  Service: Endoscopy;  Laterality: N/A;  1235  .  CORONARY STENT INTERVENTION N/A 07/31/2017   Procedure: CORONARY STENT INTERVENTION;  Surgeon: [name], MD;  Location: [place] [facility];  Service: Cardiovascular;  Laterality: N/A;  . LEFT HEART CATH AND CORONARY ANGIOGRAPHY N/A 07/31/2017   Procedure: LEFT HEART CATH AND CORONARY ANGIOGRAPHY;  Surgeon: [name], MD;  Location: [place] [facility];  Service: Cardiovascular;  Laterality: N/A;  . LEFT HEART CATH AND CORONARY ANGIOGRAPHY N/A 10/28/2019   Procedure: LEFT HEART CATH AND CORONARY ANGIOGRAPHY;  Surgeon: Belva Crome, MD;  Location: [place] [facility];  Service: Cardiovascular;  Laterality: N/A;  . PENILE PROSTHESIS IMPLANT     2016   . prostate cancer     2015. prostectomy    Current Outpatient Medications  Medication Sig Dispense Refill  . allopurinol (ZYLOPRIM) 300 MG tablet Take 300 mg by mouth daily.     Marland Kitchen aspirin EC 81 MG tablet Take 81 mg by mouth daily.    . diphenhydrAMINE (BENADRYL) 50 MG tablet Take 1 tablet (50 mg total) by mouth as needed (once per cath instructions). 1 tablet 0  . Insulin Pen Needle (PEN NEEDLES) 31G X 6 MM MISC Use as directed with ozempic 50 each 2  . Insulin Pen Needle 32G X 6 MM MISC Use with ozempic 50 each 3  . isosorbide mononitrate (IMDUR) 60 MG 24 hr tablet Take 1 tablet (60 mg total) by mouth daily. 90 tablet 1  . linaclotide (LINZESS) 72 MCG capsule Take 72 mcg by mouth every other day.    . losartan (COZAAR) 50 MG tablet Take 1 tablet by mouth am and 1/2 tab by mouth pm 30 tablet 6  . metFORMIN (GLUCOPHAGE) 500 MG tablet Take 1 tablet (500 mg total) by mouth 2 (two) times daily with a meal. 180 tablet 1  . metoprolol succinate (TOPROL-XL) 25 MG 24 hr tablet TAKE 1 & 1/2 (ONE & ONE-HALF) TABLETS BY MOUTH ONCE DAILY 135 tablet 2  . mometasone (NASONEX) 50 MCG/ACT nasal spray Place 2 sprays into the nose daily. 1 each 2  . nitroGLYCERIN (NITROSTAT) 0.4 MG SL tablet DISSOLVE ONE TABLET UNDER THE TONGUE EVERY 5  MINUTES AS NEEDED FOR CHEST PAIN.  DO NOT EXCEED A TOTAL OF 3 DOSES IN 15 MINUTES 25 tablet 0  . oxymetazoline (AFRIN) 0.05 % nasal spray Place 1 spray into both nostrils 2 (two) times daily as needed for congestion.     . pantoprazole (PROTONIX) 40 MG tablet TAKE 1 TABLET BY MOUTH ONCE DAILY BEFORE BREAKFAST 90 tablet 1  . rosuvastatin (CRESTOR) 5 MG tablet BY MOUTH ON MONDAY,&wEDNESDAY & FRIDAY 36 tablet 3  . Semaglutide,0.25 or 0.5MG /DOS, (OZEMPIC, 0.25 OR 0.5 MG/DOSE,) 2 MG/1.5ML SOPN Inject 0.25 mg into the skin once a week. 6 mL 3   Current Facility-Administered Medications  Medication Dose Route Frequency Provider Last Rate Last Admin  . sodium chloride flush (NS) 0.9 % injection 3 mL  3 mL Intravenous Q12H Herminio Commons, MD       Allergies:  Bee venom, Fish allergy, and Ivp dye [iodinated diagnostic agents]   Social History: The patient  reports that he quit smoking about 21 years ago. His smoking use included cigarettes. He has a 35.00 pack-year smoking history. He has never used smokeless tobacco. He reports current alcohol use. He reports that he does not use drugs.   Family History: The patient's family history includes Colon cancer in his mother; Coronary artery disease in his maternal grandmother.   ROS:  Please see the history of present illness. Otherwise, complete review of systems is positive for none.  All other systems are reviewed and negative.   Physical Exam: VS:  BP (!) 160/104   Pulse 73   Ht 5\' 7"  (1.702 m)   Wt 162 lb 3.2 oz (73.6 kg)   SpO2 98%   BMI 25.40 kg/m , BMI Body mass index is 25.4 kg/m.  Wt Readings from Last 3 Encounters:  08/24/20 162 lb 3.2 oz (73.6 kg)  07/27/20 159 lb 6.4 oz (72.3 kg)  07/23/20 160 lb (72.6 kg)    General: Patient appears comfortable at rest. Neck: Supple, no elevated JVP or carotid bruits, no thyromegaly. Lungs: Clear to auscultation, nonlabored breathing at rest. Cardiac: Regular rate and rhythm, no S3 or  significant systolic murmur, no pericardial rub. Extremities: No pitting edema, distal pulses 2+. Skin: Warm and dry. Musculoskeletal: No kyphosis. Neuropsychiatric: Alert and oriented x3, affect grossly appropriate.  ECG: 08/24/2020 EKG normal sinus rhythm rate of 69, right bundle branch block, left posterior fascicular block  Recent Labwork: 06/05/2020: ALT 11; AST 15; BUN 10; Creatinine, Ser 0.95; Hemoglobin 13.4; Platelets 260; Potassium 3.7; Sodium 143     Component Value Date/Time   CHOL 151 06/05/2020 1535   TRIG 99 06/05/2020 1535   HDL 53 06/05/2020 1535   CHOLHDL 2.8 06/05/2020 1535   CHOLHDL 4.3 05/27/2017 0844   VLDL 51 (H) 05/27/2017 0844   LDLCALC 80 06/05/2020 1535    Other Studies Reviewed Today:   Echocardiogram 07/31/2020  1. Left ventricular ejection fraction, by estimation, is 60 to 65%. The left ventricle has normal function. The left ventricle has no regional wall motion abnormalities. Left ventricular diastolic parameters are consistent with Grade I diastolic dysfunction (impaired relaxation). 2. Right ventricular systolic function is normal. The right ventricular size is normal. Tricuspid regurgitation signal is inadequate for assessing PA pressure. 3. The mitral valve is grossly normal. Trivial mitral valve regurgitation. 4. The aortic valve is tricuspid. There is mild calcification of the aortic valve. Aortic valve regurgitation is not visualized. 5. The inferior vena cava is normal in size with greater than 50% respiratory variability, suggesting right atrial pressure of 3 mmHg. Comparison(s): Echocardiogram done 10/13/18 showed an EF of 60-65%.   Cardiac cath 10/28/2019   Patent left main  Patent LAD mid and distal stent.  High-grade diffuse obstructive disease in a branching relatively small ramus intermedius.  Slight progression compared to 2019.  Widely patent circumflex with first obtuse marginal containing a 50% proximal narrowing  Widely  patent dominant right coronary with 30 to 40% narrowing distally.  Normal LV function with EF 55%.  LVEDP 20 mmHg.  RECOMMENDATIONS:   Continue current therapy  Further medication adjustments and management per Dr.,[place] Dominance: Co-dominant  Intervention    Echocardiogram 10/13/2018 IMPRESSIONS  1. The left ventricle has normal systolic function with an ejection  fraction of 60-65%. The cavity  size was normal. Left ventricular diastolic  Doppler parameters are consistent with impaired relaxation. No evidence of  left ventricular regional wall  motion abnormalities.  2. The right ventricle has normal systolic function. The cavity was  normal. There is no increase in right ventricular wall thickness.  3. The aortic valve is tricuspid. Mild aortic annular calcification  noted.  4. The mitral valve is grossly normal. There is mild mitral annular  calcification present.  5. The tricuspid valve is grossly normal.  6. The aortic root is normal in size and structure   Assessment and Plan:   1. CAD in native artery Status post cardiac catheterization 10/28/2019: Patent LAD mid and distal stent.  High-grade diffuse obstructive disease and branching relatively small RI, slight progression compared to 2019.  Widely patent circumflex  first obtuse marginal containing 50% proximal narrowing, widely patent dominant RCA with 30 to 40% narrowing.  EF 55%.  Patient complains of occasional very short bursts of sharp chest pain lasting only a second or 2.  Denies any radiation to the neck, arm, back, jaw.  Continue aspirin 81 mg daily, Imdur 60 mg daily, Toprol-XL 25 mg daily, nitroglycerin sublingual as needed  2. Essential hypertension Recent elevated blood pressures at home in the 403K systolic over 74Q diastolic.  Blood pressure elevated today at 160/104.  Recheck in right arm 160/98.    Increase losartan to 50 mg a.m. and 50 mg p.m. continue Toprol-XL 25 mg daily.  Add  amlodipine 5 mg daily p.o.  Start checking blood pressures daily after taking medications.  Record and bring with you in 2 weeks for nursing visit.  4. Hyperlipidemia LDL goal <70 Recent lipid panel at PCP office on 06/05/2020: TC 151, TG 99, HDL 53, LDL 80.  Continue Crestor 5 mg on Mondays, Wednesdays, and Fridays.  Please add Zetia 10 mg daily to current regimen.  5.  DOE Last visit patient stated he had been noticing some increasing shortness of breath/dyspnea on exertion..  States it seems to have progressed over the last month or so.  Recent echo 07/31/2020 demonstrated EF 60 to 65%.  No WMA's.  G1 DD.  Trivial MR.  Currently denies any shortness of breath during this visit  Medication Adjustments/Labs and Tests Ordered: Current medicines are reviewed at length with the patient today.  Concerns regarding medicines are outlined above.   Disposition: Follow-up with Dr. Harl Bowie or APP 1 month  Signed, Levell July, NP 08/24/2020 9:04 AM    [facility] [address], [place], [place] [address] Phone: [phone]; Fax: [phone]

## 2020-08-24 ENCOUNTER — Ambulatory Visit: Payer: Medicare HMO | Admitting: Family Medicine

## 2020-08-24 ENCOUNTER — Encounter: Payer: Self-pay | Admitting: Family Medicine

## 2020-08-24 ENCOUNTER — Other Ambulatory Visit: Payer: Self-pay

## 2020-08-24 VITALS — BP 160/104 | HR 73 | Ht 67.0 in | Wt 162.2 lb

## 2020-08-24 DIAGNOSIS — I10 Essential (primary) hypertension: Secondary | ICD-10-CM

## 2020-08-24 DIAGNOSIS — I25119 Atherosclerotic heart disease of native coronary artery with unspecified angina pectoris: Secondary | ICD-10-CM | POA: Diagnosis not present

## 2020-08-24 MED ORDER — AMLODIPINE BESYLATE 5 MG PO TABS: 5.0000 mg | ORAL_TABLET | Freq: Every day | ORAL | 6 refills | Status: DC

## 2020-08-24 MED ORDER — LOSARTAN POTASSIUM 50 MG PO TABS: 50.0000 mg | ORAL_TABLET | Freq: Two times a day (BID) | ORAL | 6 refills | Status: DC

## 2020-08-24 NOTE — Patient Instructions (Signed)
Medication Instructions:   Increase Losartan to 50mg  twice a day.  Begin Amlodipine 5mg  daily.   Continue all other medications.    Labwork: none  Testing/Procedures: none  Follow-Up: 1 month   Any Other Special Instructions Will Be Listed Below (If Applicable). Nurse visit in 2 weeks for blood pressure check .   If you need a refill on your cardiac medications before your next appointment, please call your pharmacy.

## 2020-08-27 ENCOUNTER — Telehealth: Payer: Self-pay | Admitting: *Deleted

## 2020-08-27 NOTE — Telephone Encounter (Signed)
4. Hyperlipidemia LDL goal <70 Recent lipid panel at PCP office on 06/05/2020: TC 151, TG 99, HDL 53, LDL 80.  Continue Crestor 5 mg on Mondays, Wednesdays, and Fridays.  Please add Zetia 10 mg daily to current regimen.

## 2020-08-27 NOTE — Telephone Encounter (Signed)
No answer

## 2020-09-03 ENCOUNTER — Ambulatory Visit: Payer: Medicare HMO | Admitting: Nurse Practitioner

## 2020-09-04 MED ORDER — EZETIMIBE 10 MG PO TABS: 10.0000 mg | ORAL_TABLET | Freq: Every day | ORAL | 6 refills | Status: DC

## 2020-09-04 NOTE — Telephone Encounter (Signed)
Patient notified and verbalized understanding.  He is willing to begin the Zetia.  Will send new prescription to [facility] now.

## 2020-09-05 ENCOUNTER — Other Ambulatory Visit: Payer: Self-pay

## 2020-09-05 ENCOUNTER — Encounter: Payer: Self-pay | Admitting: Nurse Practitioner

## 2020-09-05 ENCOUNTER — Ambulatory Visit (INDEPENDENT_AMBULATORY_CARE_PROVIDER_SITE_OTHER): Payer: Medicare HMO | Admitting: Nurse Practitioner

## 2020-09-05 ENCOUNTER — Ambulatory Visit (INDEPENDENT_AMBULATORY_CARE_PROVIDER_SITE_OTHER): Payer: Medicare HMO

## 2020-09-05 VITALS — BP 150/90 | HR 89 | Temp 97.9°F | Ht 67.0 in | Wt 160.9 lb

## 2020-09-05 VITALS — BP 150/90 | HR 89 | Temp 97.9°F | Ht 67.0 in | Wt 161.0 lb

## 2020-09-05 DIAGNOSIS — Z1159 Encounter for screening for other viral diseases: Secondary | ICD-10-CM

## 2020-09-05 DIAGNOSIS — E119 Type 2 diabetes mellitus without complications: Secondary | ICD-10-CM

## 2020-09-05 DIAGNOSIS — Z23 Encounter for immunization: Secondary | ICD-10-CM | POA: Diagnosis not present

## 2020-09-05 DIAGNOSIS — I10 Essential (primary) hypertension: Secondary | ICD-10-CM | POA: Diagnosis not present

## 2020-09-05 DIAGNOSIS — Z0000 Encounter for general adult medical examination without abnormal findings: Secondary | ICD-10-CM | POA: Diagnosis not present

## 2020-09-05 DIAGNOSIS — D229 Melanocytic nevi, unspecified: Secondary | ICD-10-CM | POA: Diagnosis not present

## 2020-09-05 DIAGNOSIS — Z8546 Personal history of malignant neoplasm of prostate: Secondary | ICD-10-CM

## 2020-09-05 LAB — POCT URINALYSIS DIPSTICK
Bilirubin, UA: NEGATIVE
Blood, UA: NEGATIVE
Glucose, UA: NEGATIVE
Ketones, UA: NEGATIVE
Leukocytes, UA: NEGATIVE
Nitrite, UA: NEGATIVE
Protein, UA: POSITIVE — AB
Spec Grav, UA: 1.02 (ref 1.010–1.025)
Urobilinogen, UA: 0.2 E.U./dL
pH, UA: 7 (ref 5.0–8.0)

## 2020-09-05 LAB — POCT UA - MICROALBUMIN
Creatinine, POC: 200 mg/dL
Microalbumin Ur, POC: 150 mg/L

## 2020-09-05 MED ORDER — LOSARTAN POTASSIUM 100 MG PO TABS: 100.0000 mg | ORAL_TABLET | Freq: Every day | ORAL | 1 refills | Status: DC

## 2020-09-05 MED ORDER — PREVNAR 20 0.5 ML IM SUSY
0.5000 mL | PREFILLED_SYRINGE | INTRAMUSCULAR | 0 refills | Status: AC
Start: 2020-09-05 — End: 2020-09-05

## 2020-09-05 MED ORDER — ALLOPURINOL 300 MG PO TABS: 300.0000 mg | ORAL_TABLET | Freq: Every day | ORAL | 1 refills | Status: DC

## 2020-09-05 MED ORDER — BOOSTRIX 5-2.5-18.5 LF-MCG/0.5 IM SUSP: 0.5000 mL | Freq: Once | INTRAMUSCULAR | 0 refills | Status: AC

## 2020-09-05 MED ORDER — OZEMPIC (0.25 OR 0.5 MG/DOSE) 2 MG/1.5ML ~~LOC~~ SOPN: 0.5000 mg | PEN_INJECTOR | SUBCUTANEOUS | 3 refills | Status: DC

## 2020-09-05 MED ORDER — METFORMIN HCL 500 MG PO TABS: 500.0000 mg | ORAL_TABLET | Freq: Every day | ORAL | 1 refills | Status: DC

## 2020-09-05 NOTE — Progress Notes (Signed)
I,Yamilka Roman [employer] as a Education administrator for [employer], FNP.,have documented all relevant documentation on the behalf of [name], FNP,as directed by  [name], FNP while in the presence of [name], [place]. This visit occurred during the SARS-CoV-2 public health emergency.  Safety protocols were in place, including screening questions prior to the visit, additional usage of staff PPE, and extensive cleaning of exam room while observing appropriate contact time as indicated for disinfecting solutions.  Subjective:     Patient ID: OSHA ERRICO , male    DOB: 1950-09-02 , 70 y.o.   MRN: [id number]   Chief Complaint  Patient presents with  . Hypertension    HPI  Patient presents today for a blood pressure f/u.  Blood pressure ranges 132-166/77-93.  He has been drinking one bottle of vitamin water.   Wt Readings from Last 3 Encounters: 09/21/20 : 159 lb (72.1 kg) 09/07/20 : 159 lb 3.2 oz (72.2 kg) 09/05/20 : 160 lb 15 oz (73 kg)   Hypertension This is a chronic problem. The current episode started 1 to 4 weeks ago. The problem has been gradually worsening since onset. The problem is uncontrolled. Pertinent negatives include no anxiety, blurred vision, chest pain, headaches, malaise/fatigue, palpitations or peripheral edema. There are no associated agents to hypertension. Risk factors for coronary artery disease include sedentary lifestyle. Past treatments include angiotensin blockers, calcium channel blockers and direct vasodilators. The current treatment provides moderate improvement. There are no compliance problems.  There is no history of angina. There is no history of chronic renal disease.     Past Medical History:  Diagnosis Date  . Arthritis   . CAD (coronary artery disease)    a. s/p DES to mid and distal LAD in 07/2017 with medical management recommended of intermediate branch  . Cancer [facility])    prostate  . CHF (congestive heart failure) ([place])   . Colon polyps    . Diabetes mellitus without complication ([place])   . Diverticulitis   . History of kidney stones   . Hypertension   . IBS (irritable bowel syndrome)   . 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 Unstable angina ([place]) 07/2017     Family History  Problem Relation Age of Onset  . Colon cancer Mother   . Coronary artery disease Maternal Grandmother   . Anesthesia problems Neg Hx   . Hypotension Neg Hx   . Malignant hyperthermia Neg Hx   . Pseudochol deficiency Neg Hx      Current Outpatient Medications:  .  losartan (COZAAR) 100 MG tablet, Take 1 tablet (100 mg total) by mouth daily., Disp: 90 tablet, Rfl: 1 .  allopurinol (ZYLOPRIM) 300 MG tablet, Take 1 tablet (300 mg total) by mouth daily., Disp: 90 tablet, Rfl: 1 .  amLODipine (NORVASC) 5 MG tablet, Take 1 tablet (5 mg total) by mouth daily., Disp: 30 tablet, Rfl: 6 .  aspirin EC 81 MG tablet, Take 81 mg by mouth daily., Disp: , Rfl:  .  diphenhydrAMINE (BENADRYL) 50 MG tablet, Take 1 tablet (50 mg total) by mouth as needed (once per cath instructions)., Disp: 1 tablet, Rfl: 0 .  ezetimibe (ZETIA) 10 MG tablet, Take 1 tablet (10 mg total) by mouth daily., Disp: 30 tablet, Rfl: 6 .  Insulin Pen Needle (PEN NEEDLES) 31G X 6 MM MISC, Use as directed with ozempic, Disp: 50 each, Rfl: 2 .  Insulin Pen Needle 32G X 6 MM MISC,  Use with ozempic, Disp: 50 each, Rfl: 3 .  isosorbide mononitrate (IMDUR) 60 MG 24 hr tablet, Take 1 tablet (60 mg total) by mouth daily., Disp: 90 tablet, Rfl: 1 .  linaclotide (LINZESS) 72 MCG capsule, Take 72 mcg by mouth every other day., Disp: , Rfl:  .  metFORMIN (GLUCOPHAGE) 500 MG tablet, Take 1 tablet (500 mg total) by mouth daily with breakfast., Disp: 90 tablet, Rfl: 1 .  metoprolol succinate (TOPROL-XL) 25 MG 24 hr tablet, TAKE 1 & 1/2 (ONE & ONE-HALF) TABLETS BY MOUTH ONCE DAILY, Disp: 135 tablet, Rfl: 2 .  mometasone (NASONEX) 50 MCG/ACT nasal spray, Place 2 sprays into  the nose daily., Disp: 1 each, Rfl: 2 .  nitroGLYCERIN (NITROSTAT) 0.4 MG SL tablet, DISSOLVE ONE TABLET UNDER THE TONGUE EVERY 5 MINUTES AS NEEDED FOR CHEST PAIN.  DO NOT EXCEED A TOTAL OF 3 DOSES IN 15 MINUTES, Disp: 25 tablet, Rfl: 0 .  oxymetazoline (AFRIN) 0.05 % nasal spray, Place 1 spray into both nostrils 2 (two) times daily as needed for congestion., Disp: , Rfl:  .  pantoprazole (PROTONIX) 40 MG tablet, TAKE 1 TABLET BY MOUTH ONCE DAILY BEFORE BREAKFAST, Disp: 90 tablet, Rfl: 1 .  rosuvastatin (CRESTOR) 5 MG tablet, BY MOUTH ON MONDAY,&wEDNESDAY & FRIDAY, Disp: 36 tablet, Rfl: 3 .  Semaglutide,0.25 or 0.5MG/DOS, (OZEMPIC, 0.25 OR 0.5 MG/DOSE,) 2 MG/1.5ML SOPN, Inject 0.5 mg into the skin once a week., Disp: 4.5 mL, Rfl: 3  Current Facility-Administered Medications:  .  sodium chloride flush (NS) 0.9 % injection 3 mL, 3 mL, Intravenous, Q12H, Herminio Commons, MD   Allergies  Allergen Reactions  . Bee Venom   . Fish Allergy Hives and Swelling  . Ivp Dye [Iodinated Diagnostic Agents] Swelling     Review of Systems  Constitutional: Negative.  Negative for malaise/fatigue.  Eyes: Negative for blurred vision.  Respiratory: Negative.   Cardiovascular: Negative for chest pain, palpitations and leg swelling.  Neurological: Negative for dizziness and headaches.  Psychiatric/Behavioral: Negative.      Today's Vitals   09/05/20 1504  BP: (!) 150/90  Pulse: 89  Temp: 97.9 F (36.6 C)  TempSrc: Oral  Weight: 160 lb 15 oz (73 kg)  Height: _0  (1.702 m)   Body mass index is 25.21 kg/m.   Objective:  Physical Exam Constitutional:      General: He is not in acute distress.    Appearance: Normal appearance. He is normal weight.  Cardiovascular:     Rate and Rhythm: Normal rate and regular rhythm.     Pulses: Normal pulses.     Heart sounds: Normal heart sounds.  Pulmonary:     Effort: Pulmonary effort is normal.     Breath sounds: Normal breath sounds.  Abdominal:      General: Abdomen is flat. Bowel sounds are normal.     Palpations: Abdomen is soft.  Musculoskeletal:        General: Normal range of motion.     Cervical back: Normal range of motion.  Skin:    General: Skin is warm and dry.     Capillary Refill: Capillary refill takes less than 2 seconds.  Neurological:     General: No focal deficit present.     Mental Status: He is alert and oriented to person, place, and time.  Psychiatric:        Mood and Affect: Mood normal.        Behavior: Behavior normal.  Thought Content: Thought content normal.        Judgment: Judgment normal.         Assessment And Plan:     1. Uncontrolled hypertension  Continues to be slightly elevated, but is improved  He has also seen his cardiologist since his last visit, I will not make any changes to his medications at this time - CMP14+EGFR; Future - losartan (COZAAR) 100 MG tablet; Take 1 tablet (100 mg total) by mouth daily.  Dispense: 90 tablet; Refill: 1  2. Type 2 diabetes mellitus without complication, without long-term current use of insulin (HCC)  Chronic, stable  Tolerating ozempic well  Will check HgbA1c - Semaglutide,0.25 or 0.5MG/DOS, (OZEMPIC, 0.25 OR 0.5 MG/DOSE,) 2 MG/1.5ML SOPN; Inject 0.5 mg into the skin once a week.  Dispense: 4.5 mL; Refill: 3 - Hemoglobin A1c; Future - CMP14+EGFR; Future - Lipid panel; Future  3. Encounter for hepatitis C screening test for low risk patient  Will check Hepatitis C screening due to recent recommendations to screen all adults 18 years and older - Hepatitis C antibody; Future  4. History of prostate cancer  He was treated for prostate cancer in 2017 - he had a prostatectomy. Has not had a PSA done since before 2020.  - PSA; Future  5. Atypical nevi  He will call with a dermatologist name  Patient was given opportunity to ask questions. Patient verbalized understanding of the plan and was able to repeat key elements of the plan. All  questions were answered to their satisfaction.  [name], FNP   I, [name], FNP, have reviewed all documentation for this visit. The documentation on 09/24/20 for the exam, diagnosis, procedures, and orders are all accurate and complete.   IF YOU HAVE BEEN REFERRED TO A SPECIALIST, IT MAY TAKE 1-2 WEEKS TO SCHEDULE/PROCESS THE REFERRAL. IF YOU HAVE NOT HEARD FROM US/SPECIALIST IN TWO WEEKS, PLEASE GIVE [place] A CALL AT [phone].   THE PATIENT IS ENCOURAGED TO PRACTICE SOCIAL DISTANCING DUE TO THE COVID-19 PANDEMIC.

## 2020-09-05 NOTE — Patient Instructions (Signed)
Ronald Faulkner , Thank you for taking time to come for your Medicare Wellness Visit. I appreciate your ongoing commitment to your health goals. Please review the following plan we discussed and let me know if I can assist you in the future.   Screening recommendations/referrals: Colonoscopy: completed 07/15/2019, due 07/14/2024 Recommended yearly ophthalmology/optometry visit for glaucoma screening and checkup Recommended yearly dental visit for hygiene and checkup  Vaccinations: Influenza vaccine: completed 03/14/2020, due 12/31/2020 Pneumococcal vaccine: sent to pharmacy Tdap vaccine: sent to pharmacy Shingles vaccine:  discussed   Covid-19:  06/05/2020, 08/26/2019, 07/28/2019  Advanced directives: Advance directive discussed with you today. Even though you declined this today please call our office should you change your mind and we can give you the proper paperwork for you to fill out.  Conditions/risks identified: none  Next appointment: Follow up in one year for your annual wellness visit.   Preventive Care 26 Years and Older, Male Preventive care refers to lifestyle choices and visits with your health care provider that can promote health and wellness. What does preventive care include?  A yearly physical exam. This is also called an annual well check.  Dental exams once or twice a year.  Routine eye exams. Ask your health care provider how often you should have your eyes checked.  Personal lifestyle choices, including:  Daily care of your teeth and gums.  Regular physical activity.  Eating a healthy diet.  Avoiding tobacco and drug use.  Limiting alcohol use.  Practicing safe sex.  Taking low doses of aspirin every day.  Taking vitamin and mineral supplements as recommended by your health care provider. What happens during an annual well check? The services and screenings done by your health care provider during your annual well check will depend on your age, overall  health, lifestyle risk factors, and family history of disease. Counseling  Your health care provider may ask you questions about your:  Alcohol use.  Tobacco use.  Drug use.  Emotional well-being.  Home and relationship well-being.  Sexual activity.  Eating habits.  History of falls.  Memory and ability to understand (cognition).  Work and work Statistician. Screening  You may have the following tests or measurements:  Height, weight, and BMI.  Blood pressure.  Lipid and cholesterol levels. These may be checked every 5 years, or more frequently if you are over 52 years old.  Skin check.  Lung cancer screening. You may have this screening every year starting at age 22 if you have a 30-pack-year history of smoking and currently smoke or have quit within the past 15 years.  Fecal occult blood test (FOBT) of the stool. You may have this test every year starting at age 67.  Flexible sigmoidoscopy or colonoscopy. You may have a sigmoidoscopy every 5 years or a colonoscopy every 10 years starting at age 32.  Prostate cancer screening. Recommendations will vary depending on your family history and other risks.  Hepatitis C blood test.  Hepatitis B blood test.  Sexually transmitted disease (STD) testing.  Diabetes screening. This is done by checking your blood sugar (glucose) after you have not eaten for a while (fasting). You may have this done every 1-3 years.  Abdominal aortic aneurysm (AAA) screening. You may need this if you are a current or former smoker.  Osteoporosis. You may be screened starting at age 8 if you are at high risk. Talk with your health care provider about your test results, treatment options, and if necessary, the need  for more tests. Vaccines  Your health care provider may recommend certain vaccines, such as:  Influenza vaccine. This is recommended every year.  Tetanus, diphtheria, and acellular pertussis (Tdap, Td) vaccine. You may need a Td  booster every 10 years.  Zoster vaccine. You may need this after age 28.  Pneumococcal 13-valent conjugate (PCV13) vaccine. One dose is recommended after age 33.  Pneumococcal polysaccharide (PPSV23) vaccine. One dose is recommended after age 62. Talk to your health care provider about which screenings and vaccines you need and how often you need them. This information is not intended to replace advice given to you by your health care provider. Make sure you discuss any questions you have with your health care provider. Document Released: 06/15/2015 Document Revised: 02/06/2016 Document Reviewed: 03/20/2015 Elsevier Interactive Patient Education  2017 [employer] Prevention in the Home Falls can cause injuries. They can happen to people of all ages. There are many things you can do to make your home safe and to help prevent falls. What can I do on the outside of my home?  Regularly fix the edges of walkways and driveways and fix any cracks.  Remove anything that might make you trip as you walk through a door, such as a raised step or threshold.  Trim any bushes or trees on the path to your home.  Use bright outdoor lighting.  Clear any walking paths of anything that might make someone trip, such as rocks or tools.  Regularly check to see if handrails are loose or broken. Make sure that both sides of any steps have handrails.  Any raised decks and porches should have guardrails on the edges.  Have any leaves, snow, or ice cleared regularly.  Use sand or salt on walking paths during winter.  Clean up any spills in your garage right away. This includes oil or grease spills. What can I do in the bathroom?  Use night lights.  Install grab bars by the toilet and in the tub and shower. Do not use towel bars as grab bars.  Use non-skid mats or decals in the tub or shower.  If you need to sit down in the shower, use a plastic, non-slip stool.  Keep the floor dry. Clean up  any water that spills on the floor as soon as it happens.  Remove soap buildup in the tub or shower regularly.  Attach bath mats securely with double-sided non-slip rug tape.  Do not have throw rugs and other things on the floor that can make you trip. What can I do in the bedroom?  Use night lights.  Make sure that you have a light by your bed that is easy to reach.  Do not use any sheets or blankets that are too big for your bed. They should not hang down onto the floor.  Have a firm chair that has side arms. You can use this for support while you get dressed.  Do not have throw rugs and other things on the floor that can make you trip. What can I do in the kitchen?  Clean up any spills right away.  Avoid walking on wet floors.  Keep items that you use a lot in easy-to-reach places.  If you need to reach something above you, use a strong step stool that has a grab bar.  Keep electrical cords out of the way.  Do not use floor polish or wax that makes floors slippery. If you must use wax, use  non-skid floor wax.  Do not have throw rugs and other things on the floor that can make you trip. What can I do with my stairs?  Do not leave any items on the stairs.  Make sure that there are handrails on both sides of the stairs and use them. Fix handrails that are broken or loose. Make sure that handrails are as long as the stairways.  Check any carpeting to make sure that it is firmly attached to the stairs. Fix any carpet that is loose or worn.  Avoid having throw rugs at the top or bottom of the stairs. If you do have throw rugs, attach them to the floor with carpet tape.  Make sure that you have a light switch at the top of the stairs and the bottom of the stairs. If you do not have them, ask someone to add them for you. What else can I do to help prevent falls?  Wear shoes that:  Do not have high heels.  Have rubber bottoms.  Are comfortable and fit you well.  Are  closed at the toe. Do not wear sandals.  If you use a stepladder:  Make sure that it is fully opened. Do not climb a closed stepladder.  Make sure that both sides of the stepladder are locked into place.  Ask someone to hold it for you, if possible.  Clearly mark and make sure that you can see:  Any grab bars or handrails.  First and last steps.  Where the edge of each step is.  Use tools that help you move around (mobility aids) if they are needed. These include:  Canes.  Walkers.  Scooters.  Crutches.  Turn on the lights when you go into a dark area. Replace any light bulbs as soon as they burn out.  Set up your furniture so you have a clear path. Avoid moving your furniture around.  If any of your floors are uneven, fix them.  If there are any pets around you, be aware of where they are.  Review your medicines with your doctor. Some medicines can make you feel dizzy. This can increase your chance of falling. Ask your doctor what other things that you can do to help prevent falls. This information is not intended to replace advice given to you by your health care provider. Make sure you discuss any questions you have with your health care provider. Document Released: 03/15/2009 Document Revised: 10/25/2015 Document Reviewed: 06/23/2014 Elsevier Interactive Patient Education  2017 [employer].

## 2020-09-05 NOTE — Addendum Note (Signed)
Addended by: Kellie Simmering on: 09/05/2020 03:53 PM   Modules accepted: Orders

## 2020-09-05 NOTE — Progress Notes (Signed)
This visit occurred during the SARS-CoV-2 public health emergency.  Safety protocols were in place, including screening questions prior to the visit, additional usage of staff PPE, and extensive cleaning of exam room while observing appropriate contact time as indicated for disinfecting solutions.  Subjective:   Ronald Faulkner is a 70 y.o. male who presents for Medicare Annual/Subsequent preventive examination.  Review of Systems     Cardiac Risk Factors include: advanced age (>62men, >53 women);diabetes mellitus;hypertension;male gender;sedentary lifestyle     Objective:    Today's Vitals   09/05/20 1438  BP: (!) 150/90  Pulse: 89  Temp: 97.9 F (36.6 C)  TempSrc: Oral  SpO2: 97%  Weight: 161 lb (73 kg)  Height: 5\' 7"  (1.702 m)   Body mass index is 25.22 kg/m.  Advanced Directives 09/05/2020 10/28/2019 07/13/2019 06/28/2018 06/23/2018 07/29/2017 05/26/2017  Does Patient Have a Medical Advance Directive? No No No No No No No  Would patient like information on creating a medical advance directive? - No - Patient declined Yes (MAU/Ambulatory/Procedural Areas - Information given) - No - Patient declined No - Patient declined Yes (Inpatient - patient requests chaplain consult to create a medical advance directive)  Pre-existing out of facility DNR order (yellow form or pink MOST form) - - - - - - -    Current Medications (verified) Outpatient Encounter Medications as of 09/05/2020  Medication Sig  . allopurinol (ZYLOPRIM) 300 MG tablet Take 300 mg by mouth daily.   Marland Kitchen amLODipine (NORVASC) 5 MG tablet Take 1 tablet (5 mg total) by mouth daily.  Marland Kitchen aspirin EC 81 MG tablet Take 81 mg by mouth daily.  . diphenhydrAMINE (BENADRYL) 50 MG tablet Take 1 tablet (50 mg total) by mouth as needed (once per cath instructions).  . ezetimibe (ZETIA) 10 MG tablet Take 1 tablet (10 mg total) by mouth daily.  . Insulin Pen Needle (PEN NEEDLES) 31G X 6 MM MISC Use as directed with ozempic  . Insulin Pen  Needle 32G X 6 MM MISC Use with ozempic  . isosorbide mononitrate (IMDUR) 60 MG 24 hr tablet Take 1 tablet (60 mg total) by mouth daily.  Marland Kitchen linaclotide (LINZESS) 72 MCG capsule Take 72 mcg by mouth every other day.  . losartan (COZAAR) 50 MG tablet Take 1 tablet (50 mg total) by mouth 2 (two) times daily.  . metFORMIN (GLUCOPHAGE) 500 MG tablet Take 1 tablet (500 mg total) by mouth 2 (two) times daily with a meal.  . metoprolol succinate (TOPROL-XL) 25 MG 24 hr tablet TAKE 1 & 1/2 (ONE & ONE-HALF) TABLETS BY MOUTH ONCE DAILY  . mometasone (NASONEX) 50 MCG/ACT nasal spray Place 2 sprays into the nose daily.  . nitroGLYCERIN (NITROSTAT) 0.4 MG SL tablet DISSOLVE ONE TABLET UNDER THE TONGUE EVERY 5 MINUTES AS NEEDED FOR CHEST PAIN.  DO NOT EXCEED A TOTAL OF 3 DOSES IN 15 MINUTES  . pneumococcal 20-Val Conj Vacc (PREVNAR 20) 0.5 ML SUSY Inject 0.5 mLs into the muscle tomorrow at 10 am for 1 dose.  . rosuvastatin (CRESTOR) 5 MG tablet BY MOUTH ON MONDAY,&wEDNESDAY & FRIDAY  . Semaglutide,0.25 or 0.5MG /DOS, (OZEMPIC, 0.25 OR 0.5 MG/DOSE,) 2 MG/1.5ML SOPN Inject 0.25 mg into the skin once a week.  . Tdap (BOOSTRIX) 5-2.5-18.5 LF-MCG/0.5 injection Inject 0.5 mLs into the muscle once for 1 dose.  Marland Kitchen oxymetazoline (AFRIN) 0.05 % nasal spray Place 1 spray into both nostrils 2 (two) times daily as needed for congestion.  (Patient not taking: Reported on  09/05/2020)  . pantoprazole (PROTONIX) 40 MG tablet TAKE 1 TABLET BY MOUTH ONCE DAILY BEFORE BREAKFAST (Patient not taking: Reported on 09/05/2020)   Facility-Administered Encounter Medications as of 09/05/2020  Medication  . sodium chloride flush (NS) 0.9 % injection 3 mL    Allergies (verified) Bee venom, Fish allergy, and Ivp dye [iodinated diagnostic agents]   History: Past Medical History:  Diagnosis Date  . Arthritis   . CAD (coronary artery disease)    a. s/p DES to mid and distal LAD in 07/2017 with medical management recommended of intermediate  branch  . Cancer [facility])    prostate  . CHF (congestive heart failure) ([place])   . Colon polyps   . Diabetes mellitus without complication ([place])   . Diverticulitis   . History of kidney stones   . Hypertension   . IBS (irritable bowel syndrome)   . 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 Unstable angina ([place]) 07/2017   Past Surgical History:  Procedure Laterality Date  . ABDOMINAL SURGERY    . APPENDECTOMY    . BREAST SURGERY     benign lump at lt side  . COLON SURGERY    . COLONOSCOPY  03/14/2011   Procedure: COLONOSCOPY;  Surgeon: [name], MD;  [facility];  Service: Endoscopy;  Laterality: N/A;  1:00  . COLONOSCOPY N/A 11/15/2015   Procedure: COLONOSCOPY;  Surgeon: [name], MD;  [facility];  Service: Endoscopy;  Laterality: N/A;  11:15  . COLONOSCOPY WITH PROPOFOL N/A 07/15/2019   Procedure: COLONOSCOPY WITH PROPOFOL;  Surgeon: [name], MD;  [facility];  Service: Endoscopy;  Laterality: N/A;  1235  . CORONARY STENT INTERVENTION N/A 07/31/2017   Procedure: CORONARY STENT INTERVENTION;  Surgeon: [name], MD;  Location: [place] CV LAB;  Service: Cardiovascular;  Laterality: N/A;  . LEFT HEART CATH AND CORONARY ANGIOGRAPHY N/A 07/31/2017   Procedure: LEFT HEART CATH AND CORONARY ANGIOGRAPHY;  Surgeon: [name], MD;  Location: [place] CV LAB;  Service: Cardiovascular;  Laterality: N/A;  . LEFT HEART CATH AND CORONARY ANGIOGRAPHY N/A 10/28/2019   Procedure: LEFT HEART CATH AND CORONARY ANGIOGRAPHY;  Surgeon: Belva Crome, MD;  Location: [place] CV LAB;  Service: Cardiovascular;  Laterality: N/A;  . PENILE PROSTHESIS IMPLANT     2016   . prostate cancer     2015. prostectomy   Family History  Problem Relation Age of Onset  . Colon cancer Mother   . Coronary artery disease Maternal Grandmother   . Anesthesia problems Neg Hx   . Hypotension Neg Hx   .  Malignant hyperthermia Neg Hx   . Pseudochol deficiency Neg Hx    Social History   Socioeconomic History  . Marital status: Married    Spouse name: Not on file  . Number of children: 2  . Years of education: Not on file  . Highest education level: Not on file  Occupational History    Employer: [employer]  Tobacco Use  . Smoking status: Former Smoker    Packs/day: 1.00    Years: 35.00    Pack years: 35.00    Types: Cigarettes    Quit date: 06/03/1999    Years since quitting: 21.2  . Smokeless tobacco: Never Used  Vaping Use  . Vaping Use: Never used  Substance and Sexual Activity  . Alcohol use: Yes    Comment: occ  .  Drug use: No    Comment: hx of use in his colege days  . Sexual activity: Yes  Other Topics Concern  . Not on file  Social History Narrative   Lives at home with wife and daughter and her three children.     Social Determinants of Health   Financial Resource Strain: Low Risk   . Difficulty of Paying Living Expenses: Not hard at all  Food Insecurity: No Food Insecurity  . Worried About Charity fundraiser in the Last Year: Never true  . Ran Out of Food in the Last Year: Never true  Transportation Needs: No Transportation Needs  . Lack of Transportation (Medical): No  . Lack of Transportation (Non-Medical): No  Physical Activity: Inactive  . Days of Exercise per Week: 0 days  . Minutes of Exercise per Session: 0 min  Stress: No Stress Concern Present  . Feeling of Stress : Not at all  Social Connections: Not on file    Tobacco Counseling Counseling given: Not Answered   Clinical Intake:  Pre-visit preparation completed: Yes  Pain : No/denies pain     Nutritional Status: BMI 25 -29 Overweight Nutritional Risks: None Diabetes: Yes  How often do you need to have someone help you when you read instructions, pamphlets, or other written materials from your doctor or pharmacy?: 1 - Never What is the last grade level you completed in  school?: 2 yrs college  Diabetic? Yes Nutrition Risk Assessment:  Has the patient had any N/V/D within the last 2 months?  No  Does the patient have any non-healing wounds?  No  Has the patient had any unintentional weight loss or weight gain?  No   Diabetes:  Is the patient diabetic?  Yes  If diabetic, was a CBG obtained today?  No  Did the patient bring in their glucometer from home?  No  How often do you monitor your CBG's? 2-3 weekly.   Financial Strains and Diabetes Management:  Are you having any financial strains with the device, your supplies or your medication? No .  Does the patient want to be seen by Chronic Care Management for management of their diabetes?  No  Would the patient like to be referred to a Nutritionist or for Diabetic Management?  No   Diabetic Exams:  Diabetic Eye Exam: Completed 08/22/2020 Diabetic Foot Exam: Overdue, Pt has been advised about the importance in completing this exam. Pt is scheduled for diabetic foot exam on next appointment.   Interpreter Needed?: No  Information entered by :: NAllen LPN   Activities of Daily Living In your present state of health, do you have any difficulty performing the following activities: 09/05/2020 07/23/2020  Hearing? N N  Vision? N N  Difficulty concentrating or making decisions? Y N  Comment some forgetfulness -  Walking or climbing stairs? N N  Dressing or bathing? N N  Doing errands, shopping? N N  Preparing Food and eating ? N -  Using the Toilet? N -  In the past six months, have you accidently leaked urine? Y -  Comment due to prostate -  Do you have problems with loss of bowel control? N -  Managing your Medications? N -  Managing your Finances? N -  Housekeeping or managing your Housekeeping? N -  Some recent data might be hidden    Patient Care Team: [name], FNP as PCP - General (General Practice)  Indicate any recent Medical Services you may have received  from other than [facility]  providers in the past year (date may be approximate).     Assessment:   This is a routine wellness examination for [facility].  Hearing/Vision screen  Hearing Screening   125Hz  250Hz  500Hz  1000Hz  2000Hz  3000Hz  4000Hz  6000Hz  8000Hz   Right ear:           Left ear:           Vision Screening Comments: No regular eye exams, Dr. Katy Fitch  Dietary issues and exercise activities discussed: Current Exercise Habits: The patient does not participate in regular exercise at present  Goals    . Patient Stated     09/05/2020, wants to lose stomach      Depression Screen PHQ 2/9 Scores 09/05/2020 06/05/2020  PHQ - 2 Score 0 0    Fall Risk Fall Risk  09/05/2020 06/05/2020  Falls in the past year? 1 0  Comment fell off lawn mower -  Number falls in past yr: 0 -  Injury with Fall? 0 -  Risk for fall due to : Medication side effect -  Follow up Falls evaluation completed;Education provided;Falls prevention discussed -    FALL RISK PREVENTION PERTAINING TO THE HOME:  Any stairs in or around the home? Yes  If so, are there any without handrails? No  Home free of loose throw rugs in walkways, pet beds, electrical cords, etc? Yes  Adequate lighting in your home to reduce risk of falls? Yes   ASSISTIVE DEVICES UTILIZED TO PREVENT FALLS:  Life alert? No  Use of a cane, walker or w/c? No  Grab bars in the bathroom? No  Shower chair or bench in shower? No  Elevated toilet seat or a handicapped toilet? Yes   TIMED UP AND GO:  Was the test performed? No .   Gait steady and fast without use of assistive device  Cognitive Function:     6CIT Screen 09/05/2020  What Year? 0 points  What month? 0 points  What time? 0 points  Count back from 20 0 points  Months in reverse 0 points  Repeat phrase 2 points  Total Score 2    Immunizations Immunization History  Administered Date(s) Administered  . Influenza, High Dose Seasonal PF 05/27/2017  . Influenza-Unspecified 03/14/2020  . Moderna Sars-Covid-2  Vaccination 07/28/2019, 08/26/2019, 06/05/2020    TDAP status: Due, Education has been provided regarding the importance of this vaccine. Advised may receive this vaccine at local pharmacy or Health Dept. Aware to provide a copy of the vaccination record if obtained from local pharmacy or Health Dept. Verbalized acceptance and understanding.  Flu Vaccine status: Up to date  Pneumococcal vaccine status: sent to pharmacy  Covid-19 vaccine status: Completed vaccines  Qualifies for Shingles Vaccine? Yes   Zostavax completed No   Shingrix Completed?: No.    Education has been provided regarding the importance of this vaccine. Patient has been advised to call insurance company to determine out of pocket expense if they have not yet received this vaccine. Advised may also receive vaccine at local pharmacy or Health Dept. Verbalized acceptance and understanding.  Screening Tests Health Maintenance  Topic Date Due  . Hepatitis C Screening  Never done  . FOOT EXAM  Never done  . TETANUS/TDAP  Never done  . PNA vac Low Risk Adult (1 of 2 - PCV13) Never done  . HEMOGLOBIN A1C  12/03/2020  . COVID-19 Vaccine (4 - Booster for Moderna series) 12/03/2020  . INFLUENZA VACCINE  12/31/2020  .  OPHTHALMOLOGY EXAM  08/22/2021  . COLONOSCOPY (Pts 45-2yrs Insurance coverage will need to be confirmed)  07/14/2024  . HPV VACCINES  Aged Out    Health Maintenance  Health Maintenance Due  Topic Date Due  . Hepatitis C Screening  Never done  . FOOT EXAM  Never done  . TETANUS/TDAP  Never done  . PNA vac Low Risk Adult (1 of 2 - PCV13) Never done    Colorectal cancer screening: Type of screening: Colonoscopy. Completed 07/15/2019. Repeat every 5 years  Lung Cancer Screening: (Low Dose CT Chest recommended if Age 67-80 years, 30 pack-year currently smoking OR have quit w/in 15years.) does not qualify.   Lung Cancer Screening Referral: no  Additional Screening:  Hepatitis C Screening: does qualify;  Completed today  Vision Screening: Recommended annual ophthalmology exams for early detection of glaucoma and other disorders of the eye. Is the patient up to date with their annual eye exam?  Yes  Who is the provider or what is the name of the office in which the patient attends annual eye exams? Dr. Katy Fitch If pt is not established with a provider, would they like to be referred to a provider to establish care? No .   Dental Screening: Recommended annual dental exams for proper oral hygiene  Community Resource Referral / Chronic Care Management: CRR required this visit?  No   CCM required this visit?  No      Plan:     I have personally reviewed and noted the following in the patient's chart:   . Medical and social history . Use of alcohol, tobacco or illicit drugs  . Current medications and supplements . Functional ability and status . Nutritional status . Physical activity . Advanced directives . List of other physicians . Hospitalizations, surgeries, and ER visits in previous 12 months . Vitals . Screenings to include cognitive, depression, and falls . Referrals and appointments  In addition, I have reviewed and discussed with patient certain preventive protocols, quality metrics, and best practice recommendations. A written personalized care plan for preventive services as well as general preventive health recommendations were provided to patient.     Kellie Simmering, LPN   [id number]   Nurse Notes:

## 2020-09-05 NOTE — Patient Instructions (Addendum)

## 2020-09-07 ENCOUNTER — Ambulatory Visit (INDEPENDENT_AMBULATORY_CARE_PROVIDER_SITE_OTHER): Payer: Medicare HMO | Admitting: *Deleted

## 2020-09-07 ENCOUNTER — Other Ambulatory Visit: Payer: Self-pay

## 2020-09-07 ENCOUNTER — Encounter: Payer: Self-pay | Admitting: *Deleted

## 2020-09-07 VITALS — BP 142/80 | HR 80 | Ht 67.0 in | Wt 159.2 lb

## 2020-09-07 DIAGNOSIS — I10 Essential (primary) hypertension: Secondary | ICD-10-CM

## 2020-09-07 NOTE — Progress Notes (Signed)
1.) Reason for visit: BP check  2.) Name of MD requesting visit: Levell July, NP  3.) H&P: losartan increased to 50 mg BID at last visit. Reports taking 100 mg daily. Has taken all medications as prescribed without missing doses.  4.) ROS related to problem: Denies, dizziness, chest pain, sob, headache or blurred vision  5.) Assessment and plan per MD: sent to provider for review

## 2020-09-11 ENCOUNTER — Other Ambulatory Visit: Payer: Self-pay

## 2020-09-11 ENCOUNTER — Other Ambulatory Visit: Payer: Medicare HMO

## 2020-09-11 DIAGNOSIS — Z8546 Personal history of malignant neoplasm of prostate: Secondary | ICD-10-CM

## 2020-09-11 DIAGNOSIS — Z1159 Encounter for screening for other viral diseases: Secondary | ICD-10-CM

## 2020-09-11 DIAGNOSIS — E119 Type 2 diabetes mellitus without complications: Secondary | ICD-10-CM

## 2020-09-11 DIAGNOSIS — I10 Essential (primary) hypertension: Secondary | ICD-10-CM

## 2020-09-12 LAB — CMP14+EGFR
ALT: 10 IU/L (ref 0–44)
AST: 10 IU/L (ref 0–40)
Albumin/Globulin Ratio: 1.4 (ref 1.2–2.2)
Albumin: 4.1 g/dL (ref 3.8–4.8)
Alkaline Phosphatase: 150 IU/L — ABNORMAL HIGH (ref 44–121)
BUN/Creatinine Ratio: 13 (ref 10–24)
BUN: 10 mg/dL (ref 8–27)
Bilirubin Total: 0.2 mg/dL (ref 0.0–1.2)
CO2: 22 mmol/L (ref 20–29)
Calcium: 9.6 mg/dL (ref 8.6–10.2)
Chloride: 103 mmol/L (ref 96–106)
Creatinine, Ser: 0.8 mg/dL (ref 0.76–1.27)
Globulin, Total: 2.9 g/dL (ref 1.5–4.5)
Glucose: 127 mg/dL — ABNORMAL HIGH (ref 65–99)
Potassium: 4.2 mmol/L (ref 3.5–5.2)
Sodium: 141 mmol/L (ref 134–144)
Total Protein: 7 g/dL (ref 6.0–8.5)
eGFR: 95 mL/min/{1.73_m2} (ref >59)

## 2020-09-12 LAB — LIPID PANEL
Chol/HDL Ratio: 2.9 ratio (ref 0.0–5.0)
Cholesterol, Total: 141 mg/dL (ref 100–199)
HDL: 48 mg/dL (ref >39)
LDL Chol Calc (NIH): 69 mg/dL (ref 0–99)
Triglycerides: 134 mg/dL (ref 0–149)
VLDL Cholesterol Cal: 24 mg/dL (ref 5–40)

## 2020-09-12 LAB — PSA: Prostate Specific Ag, Serum: <0.1 ng/mL (ref 0.0–4.0)

## 2020-09-12 LAB — HEPATITIS C ANTIBODY: Hep C Virus Ab: <0.1 s/co ratio (ref 0.0–0.9)

## 2020-09-12 LAB — HEMOGLOBIN A1C
Est. average glucose Bld gHb Est-mCnc: 151 mg/dL
Hgb A1c MFr Bld: 6.9 % — ABNORMAL HIGH (ref 4.8–5.6)

## 2020-09-20 NOTE — Progress Notes (Signed)
Cardiology Office Note  Date: 09/21/2020   ID: Faulkner, Ronald 08-13-1950, MRN [id number]  PCP:  [name], FNP  Cardiologist:  No primary care provider on file. Electrophysiologist:  None   Chief Complaint: 32-month follow-up  History of Present Illness: Ronald Faulkner is a 70 y.o. male with a history of accelerating angina with history of CAD.  DES to mid and distal LAD 07/31/2017.  Moderately severe stenosis and small caliber intermediate branch and medical management was recommended.  Moderate stenosis in the distal RCA 50%.  Other medical history includes HTN and DM2 . Last saw Dr. Bronson Ing 10/26/2019.  He had been complaining the previous month of episodic chest pain lasting for approximately 5 seconds, occurring at rest, sometimes occurring every day, or every 2 or 3 days.  Having marked shortness of breath with exertion walking 20 to 30 feet from kitchen to the living room.  Short of breath when climbing a flight of stairs.  Feeling dizzy when bending over.  Used nitroglycerin on 2 separate occasions.  A cardiac catheterization was ordered.  HCTZ was stopped and losartan was reduced to 50 mg due to hypotension.  Last LDL 132 on 03/24/2018.  He did not tolerate atorvastatin 40 mg daily.  Currently taking Crestor 5 mg every Monday, Wednesday, Friday.  No longer on Zetia.  Still having some mild myalgia but not as severe as when taking Lipitor.   Last year for 71-month follow-up.  Had recently seen primary care provider who increased his losartan to 75 mg daily due to elevated blood pressure.  Blood pressure had improved.  Stated he would have a brief instantaneous chest pain which lasted only a second not related to activity with no radiation or associated nausea, vomiting, diaphoresis.  Stated he had been noticing increasing dyspnea on exertion.  States he recently went to [employer] park and did some walking with his wife and noticing increasing shortness of breath..  Echocardiogram was ordered.  Was here last visit for follow-up.  I reviewed the echo cardiogram results with him.  Echo demonstrated EF of 60 to 65%.  No WMA's, G1 DD.  Trivial MR.  Blood pressure blood pressure was elevated.  He stated his blood pressure had been elevated at home usually in the 700F systolic to high 74B diastolic.  Initial blood pressure 160/104.  Recheck in right arm 160/98.  He denies any shortness of breath.  No any anginal symptoms other than above-mentioned fleeting chest pain without radiation or associated symptoms.  No palpitations or arrhythmias, orthostatic symptoms, CVA or TIA-like symptoms, PND, orthopnea, bleeding, claudication, DVT or PE-like symptoms, lower extremity edema.  He is here for follow-up today.  Since starting amlodipine and increasing losartan to 100 mg daily his blood pressure has improved.  Today's blood pressure is 130/80.  His LDL was elevated at 80 at last visit.  He had recent lab work from PCP which showed LDL 69.  He states he has not start taking the Zetia at the request of his PCP until he had the lab work.  He denies any anginal or exertional symptoms, orthostatic symptoms, CVA or TIA-like symptoms, palpitations or arrhythmias, orthostatic symptoms, PND, orthopnea, bleeding, claudication, DVT or PE-like symptoms, lower extremity edema.  He does states he has had some bowel issues with some diarrhea he attributes to metformin and Ozempic.   Past Medical History:  Diagnosis Date  . Arthritis   . CAD (coronary artery disease)    a.  s/p DES to mid and distal LAD in 07/2017 with medical management recommended of intermediate branch  . Cancer [facility])    prostate  . CHF (congestive heart failure) ([place])   . Colon polyps   . Diabetes mellitus without complication ([place])   . Diverticulitis   . History of kidney stones   . Hypertension   . IBS (irritable bowel syndrome)   . 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  Unstable angina ([place]) 07/2017    Past Surgical History:  Procedure Laterality Date  . ABDOMINAL SURGERY    . APPENDECTOMY    . BREAST SURGERY     benign lump at lt side  . COLON SURGERY    . COLONOSCOPY  03/14/2011   Procedure: COLONOSCOPY;  Surgeon: Ronald Houston, MD;  [facility];  Service: Endoscopy;  Laterality: N/A;  1:00  . COLONOSCOPY N/A 11/15/2015   Procedure: COLONOSCOPY;  Surgeon: Ronald Houston, MD;  [facility];  Service: Endoscopy;  Laterality: N/A;  11:15  . COLONOSCOPY WITH PROPOFOL N/A 07/15/2019   Procedure: COLONOSCOPY WITH PROPOFOL;  Surgeon: Ronald Houston, MD;  [facility];  Service: Endoscopy;  Laterality: N/A;  1235  . CORONARY STENT INTERVENTION N/A 07/31/2017   Procedure: CORONARY STENT INTERVENTION;  Surgeon: [name], MD;  Location: [place] CV LAB;  Service: Cardiovascular;  Laterality: N/A;  . LEFT HEART CATH AND CORONARY ANGIOGRAPHY N/A 07/31/2017   Procedure: LEFT HEART CATH AND CORONARY ANGIOGRAPHY;  Surgeon: [name], MD;  Location: [place] CV LAB;  Service: Cardiovascular;  Laterality: N/A;  . LEFT HEART CATH AND CORONARY ANGIOGRAPHY N/A 10/28/2019   Procedure: LEFT HEART CATH AND CORONARY ANGIOGRAPHY;  Surgeon: Ronald Crome, MD;  Location: [place] CV LAB;  Service: Cardiovascular;  Laterality: N/A;  . PENILE PROSTHESIS IMPLANT     2016   . prostate cancer     2015. prostectomy    Current Outpatient Medications  Medication Sig Dispense Refill  . allopurinol (ZYLOPRIM) 300 MG tablet Take 1 tablet (300 mg total) by mouth daily. 90 tablet 1  . amLODipine (NORVASC) 5 MG tablet Take 1 tablet (5 mg total) by mouth daily. 30 tablet 6  . aspirin EC 81 MG tablet Take 81 mg by mouth daily.    . diphenhydrAMINE (BENADRYL) 50 MG tablet Take 1 tablet (50 mg total) by mouth as needed (once per cath instructions). 1 tablet 0  . ezetimibe (ZETIA) 10 MG tablet Take 1 tablet (10 mg total)  by mouth daily. 30 tablet 6  . Insulin Pen Needle (PEN NEEDLES) 31G X 6 MM MISC Use as directed with ozempic 50 each 2  . Insulin Pen Needle 32G X 6 MM MISC Use with ozempic 50 each 3  . isosorbide mononitrate (IMDUR) 60 MG 24 hr tablet Take 1 tablet (60 mg total) by mouth daily. 90 tablet 1  . linaclotide (LINZESS) 72 MCG capsule Take 72 mcg by mouth every other day.    . losartan (COZAAR) 100 MG tablet Take 1 tablet (100 mg total) by mouth daily. 90 tablet 1  . metFORMIN (GLUCOPHAGE) 500 MG tablet Take 1 tablet (500 mg total) by mouth daily with breakfast. 90 tablet 1  . metoprolol succinate (TOPROL-XL) 25 MG 24 hr tablet TAKE 1 & 1/2 (ONE & ONE-HALF) TABLETS BY MOUTH ONCE DAILY 135 tablet 2  . mometasone (NASONEX) 50 MCG/ACT nasal spray Place 2 sprays  into the nose daily. 1 each 2  . nitroGLYCERIN (NITROSTAT) 0.4 MG SL tablet DISSOLVE ONE TABLET UNDER THE TONGUE EVERY 5 MINUTES AS NEEDED FOR CHEST PAIN.  DO NOT EXCEED A TOTAL OF 3 DOSES IN 15 MINUTES 25 tablet 0  . oxymetazoline (AFRIN) 0.05 % nasal spray Place 1 spray into both nostrils 2 (two) times daily as needed for congestion.    . pantoprazole (PROTONIX) 40 MG tablet TAKE 1 TABLET BY MOUTH ONCE DAILY BEFORE BREAKFAST 90 tablet 1  . rosuvastatin (CRESTOR) 5 MG tablet BY MOUTH ON MONDAY,&wEDNESDAY & FRIDAY 36 tablet 3  . Semaglutide,0.25 or 0.5MG /DOS, (OZEMPIC, 0.25 OR 0.5 MG/DOSE,) 2 MG/1.5ML SOPN Inject 0.5 mg into the skin once a week. 4.5 mL 3   Current Facility-Administered Medications  Medication Dose Route Frequency Provider Last Rate Last Admin  . sodium chloride flush (NS) 0.9 % injection 3 mL  3 mL Intravenous Q12H Herminio Commons, MD       Allergies:  Bee venom, Fish allergy, and Ivp dye [iodinated diagnostic agents]   Social History: The patient  reports that he quit smoking about 21 years ago. His smoking use included cigarettes. He has a 35.00 pack-year smoking history. He has never used smokeless tobacco. He reports  current alcohol use. He reports that he does not use drugs.   Family History: The patient's family history includes Colon cancer in his mother; Coronary artery disease in his maternal grandmother.   ROS:  Please see the history of present illness. Otherwise, complete review of systems is positive for none.  All other systems are reviewed and negative.   Physical Exam: VS:  BP 130/80   Pulse 94   Ht 5\' 7"  (1.702 m)   Wt 159 lb (72.1 kg)   SpO2 98%   BMI 24.90 kg/m , BMI Body mass index is 24.9 kg/m.  Wt Readings from Last 3 Encounters:  09/21/20 159 lb (72.1 kg)  09/07/20 159 lb 3.2 oz (72.2 kg)  09/05/20 160 lb 15 oz (73 kg)    General: Patient appears comfortable at rest. Neck: Supple, no elevated JVP or carotid bruits, no thyromegaly. Lungs: Clear to auscultation, nonlabored breathing at rest. Cardiac: Regular rate and rhythm, no S3 or significant systolic murmur, no pericardial rub. Extremities: No pitting edema, distal pulses 2+. Skin: Warm and dry. Musculoskeletal: No kyphosis. Neuropsychiatric: Alert and oriented x3, affect grossly appropriate.  ECG: 08/24/2020 EKG normal sinus rhythm rate of 69, right bundle branch block, left posterior fascicular block  Recent Labwork: 06/05/2020: Hemoglobin 13.4; Platelets 260 09/11/2020: ALT 10; AST 10; BUN 10; Creatinine, Ser 0.80; Potassium 4.2; Sodium 141     Component Value Date/Time   CHOL 141 09/11/2020 0853   TRIG 134 09/11/2020 0853   HDL 48 09/11/2020 0853   CHOLHDL 2.9 09/11/2020 0853   CHOLHDL 4.3 05/27/2017 0844   VLDL 51 (H) 05/27/2017 0844   LDLCALC 69 09/11/2020 0853    Other Studies Reviewed Today:   Echocardiogram 07/31/2020  1. Left ventricular ejection fraction, by estimation, is 60 to 65%. The left ventricle has normal function. The left ventricle has no regional wall motion abnormalities. Left ventricular diastolic parameters are consistent with Grade I diastolic dysfunction (impaired relaxation). 2. Right  ventricular systolic function is normal. The right ventricular size is normal. Tricuspid regurgitation signal is inadequate for assessing PA pressure. 3. The mitral valve is grossly normal. Trivial mitral valve regurgitation. 4. The aortic valve is tricuspid. There is mild calcification of the  aortic valve. Aortic valve regurgitation is not visualized. 5. The inferior vena cava is normal in size with greater than 50% respiratory variability, suggesting right atrial pressure of 3 mmHg. Comparison(s): Echocardiogram done 10/13/18 showed an EF of 60-65%.   Cardiac cath 10/28/2019   Patent left main  Patent LAD mid and distal stent.  High-grade diffuse obstructive disease in a branching relatively small ramus intermedius.  Slight progression compared to 2019.  Widely patent circumflex with first obtuse marginal containing a 50% proximal narrowing  Widely patent dominant right coronary with 30 to 40% narrowing distally.  Normal LV function with EF 55%.  LVEDP 20 mmHg.  RECOMMENDATIONS:   Continue current therapy  Further medication adjustments and management per Dr.,[place] Dominance: Co-dominant  Intervention    Echocardiogram 10/13/2018 IMPRESSIONS  1. The left ventricle has normal systolic function with an ejection  fraction of 60-65%. The cavity size was normal. Left ventricular diastolic  Doppler parameters are consistent with impaired relaxation. No evidence of  left ventricular regional wall  motion abnormalities.  2. The right ventricle has normal systolic function. The cavity was  normal. There is no increase in right ventricular wall thickness.  3. The aortic valve is tricuspid. Mild aortic annular calcification  noted.  4. The mitral valve is grossly normal. There is mild mitral annular  calcification present.  5. The tricuspid valve is grossly normal.  6. The aortic root is normal in size and structure   Assessment and Plan:   1. CAD in native  artery Status post cardiac catheterization 10/28/2019: Patent LAD mid and distal stent.  High-grade diffuse obstructive disease and branching relatively small RI, slight progression compared to 2019.  Widely patent circumflex  first obtuse marginal containing 50% proximal narrowing, widely patent dominant RCA with 30 to 40% narrowing.  EF 55%.  He complains of occasional very short bursts of sharp chest pain lasting only a second or two.   Continue aspirin 81 mg daily, Imdur 60 mg daily, Toprol-XL 25 mg daily, nitroglycerin sublingual as needed  2. Essential hypertension  Increase losartan to 50 mg a.m. and 50 mg p.m. continue Toprol-XL 25 mg daily.  Add amlodipine 5 mg daily p.o.  Start checking blood pressures daily after taking medications.  Record and bring with you in 2 weeks for nursing visit.  4. Hyperlipidemia LDL goal <70 Recent lipid panel at PCP office on 06/05/2020: TC 151, TG 99, HDL 53, LDL 80.  Continue Crestor 5 mg on Mondays, Wednesdays, and Fridays.  Recent labs on 09/11/2020 by PCP showed improvement in LDL to 69.  Go ahead and start Zetia 10 mg daily.  5.  DOE/shortness of breath Currently denies any DOE or shortness of breath.  Recent echo 07/31/2020 demonstrated EF 60 to 65%.  No WMA's.  G1 DD.  Trivial MR.  Medication Adjustments/Labs and Tests Ordered: Current medicines are reviewed at length with the patient today.  Concerns regarding medicines are outlined above.   Disposition: Follow-up with Dr. Harl Bowie or APP 6 months  Signed, Levell July, NP 09/21/2020 8:42 AM    [facility] [place], [place], [place] [address] Phone: [phone]; Fax: [phone]

## 2020-09-21 ENCOUNTER — Encounter: Payer: Self-pay | Admitting: Family Medicine

## 2020-09-21 ENCOUNTER — Ambulatory Visit (INDEPENDENT_AMBULATORY_CARE_PROVIDER_SITE_OTHER): Payer: Medicare HMO | Admitting: Family Medicine

## 2020-09-21 VITALS — BP 130/80 | HR 94 | Ht 67.0 in | Wt 159.0 lb

## 2020-09-21 DIAGNOSIS — R0602 Shortness of breath: Secondary | ICD-10-CM | POA: Diagnosis not present

## 2020-09-21 DIAGNOSIS — I10 Essential (primary) hypertension: Secondary | ICD-10-CM | POA: Diagnosis not present

## 2020-09-21 DIAGNOSIS — I2510 Atherosclerotic heart disease of native coronary artery without angina pectoris: Secondary | ICD-10-CM

## 2020-09-21 DIAGNOSIS — E785 Hyperlipidemia, unspecified: Secondary | ICD-10-CM

## 2020-09-21 NOTE — Patient Instructions (Signed)
Medication Instructions:  Continue all current medications.   Labwork: none  Testing/Procedures: none  Follow-Up: 6 months   Any Other Special Instructions Will Be Listed Below (If Applicable).   If you need a refill on your cardiac medications before your next appointment, please call your pharmacy.  

## 2020-09-26 ENCOUNTER — Ambulatory Visit: Payer: Medicare HMO | Admitting: Nurse Practitioner

## 2020-10-17 ENCOUNTER — Other Ambulatory Visit: Payer: Self-pay

## 2020-10-17 DIAGNOSIS — E119 Type 2 diabetes mellitus without complications: Secondary | ICD-10-CM

## 2020-10-17 DIAGNOSIS — I10 Essential (primary) hypertension: Secondary | ICD-10-CM

## 2020-10-22 ENCOUNTER — Telehealth: Payer: Self-pay | Admitting: *Deleted

## 2020-10-22 NOTE — Chronic Care Management (AMB) (Signed)
  Chronic Care Management   Note  10/22/2020 Name: Ronald Faulkner MRN: [id number] DOB: 04/29/1951  Ronald Faulkner is a 70 y.o. year old male who is a primary care patient of [name], [place]. I reached out to [name] by phone today in response to a referral sent by [name]'s PCP, [name], FNP.     [name] was given information about Chronic Care Management services today including:  1. CCM service includes personalized support from designated clinical staff supervised by his physician, including individualized plan of care and coordination with other care providers 2. 24/7 contact phone numbers for assistance for urgent and routine care needs. 3. Service will only be billed when office clinical staff spend 20 minutes or more in a month to coordinate care. 4. Only one practitioner may furnish and bill the service in a calendar month. 5. The patient may stop CCM services at any time (effective at the end of the month) by phone call to the office staff. 6. The patient will be responsible for cost sharing (co-pay) of up to 20% of the service fee (after annual deductible is met).  Patient agreed to services and verbal consent obtained.   Follow up plan: Telephone appointment with care management team member scheduled for:11/13/2020  [place] [facility]

## 2020-11-05 ENCOUNTER — Telehealth: Payer: Self-pay

## 2020-11-05 ENCOUNTER — Other Ambulatory Visit: Payer: Self-pay | Admitting: Family Medicine

## 2020-11-05 ENCOUNTER — Other Ambulatory Visit (INDEPENDENT_AMBULATORY_CARE_PROVIDER_SITE_OTHER): Payer: Self-pay | Admitting: Gastroenterology

## 2020-11-05 NOTE — Chronic Care Management (AMB) (Signed)
    Chronic Care Management Pharmacy Assistant   Name: Ronald Faulkner  MRN: [id number] DOB: 08-31-1950  Reason for Encounter: Patient Assistance Coordination   11/05/2020- Patient assistance application filled out for Ozempic with [employer] Patient assistance program. Patient has an initial visit with [name], CPP on 11/13/2020. Academic librarian for provider signature, patient signature and income documentation.  Called patient he is aware that forms are ready to be signed and aware of what type of income documentation needed for all in household. Patient unavailable to come this week to sign but he will be by next week to sign forms and bring income documentation. [name], CPP notified.  Medications: Outpatient Encounter Medications as of 11/05/2020  Medication Sig  . allopurinol (ZYLOPRIM) 300 MG tablet Take 1 tablet (300 mg total) by mouth daily.  Marland Kitchen amLODipine (NORVASC) 5 MG tablet Take 1 tablet (5 mg total) by mouth daily.  Marland Kitchen aspirin EC 81 MG tablet Take 81 mg by mouth daily.  . diphenhydrAMINE (BENADRYL) 50 MG tablet Take 1 tablet (50 mg total) by mouth as needed (once per cath instructions).  . ezetimibe (ZETIA) 10 MG tablet Take 1 tablet (10 mg total) by mouth daily.  . Insulin Pen Needle (PEN NEEDLES) 31G X 6 MM MISC Use as directed with ozempic  . Insulin Pen Needle 32G X 6 MM MISC Use with ozempic  . isosorbide mononitrate (IMDUR) 60 MG 24 hr tablet Take 1 tablet (60 mg total) by mouth daily.  Marland Kitchen linaclotide (LINZESS) 72 MCG capsule Take 72 mcg by mouth every other day.  . losartan (COZAAR) 100 MG tablet Take 1 tablet (100 mg total) by mouth daily.  . metFORMIN (GLUCOPHAGE) 500 MG tablet Take 1 tablet (500 mg total) by mouth daily with breakfast.  . metoprolol succinate (TOPROL-XL) 25 MG 24 hr tablet TAKE 1 & 1/2 (ONE & ONE-HALF) TABLETS BY MOUTH ONCE DAILY  . mometasone (NASONEX) 50 MCG/ACT nasal spray Place 2 sprays into the nose daily.  . nitroGLYCERIN  (NITROSTAT) 0.4 MG SL tablet DISSOLVE ONE TABLET UNDER THE TONGUE EVERY 5 MINUTES AS NEEDED FOR CHEST PAIN.&nbsp;&nbsp;DO NOT EXCEED A TOTAL OF 3 DOSES IN 15 MINUTES  . oxymetazoline (AFRIN) 0.05 % nasal spray Place 1 spray into both nostrils 2 (two) times daily as needed for congestion.  . pantoprazole (PROTONIX) 40 MG tablet TAKE 1 TABLET BY MOUTH ONCE DAILY BEFORE BREAKFAST  . rosuvastatin (CRESTOR) 5 MG tablet BY MOUTH ON MONDAY,&wEDNESDAY & FRIDAY  . Semaglutide,0.25 or 0.5MG /DOS, (OZEMPIC, 0.25 OR 0.5 MG/DOSE,) 2 MG/1.5ML SOPN Inject 0.5 mg into the skin once a week.   Facility-Administered Encounter Medications as of 11/05/2020  Medication  . sodium chloride flush (NS) 0.9 % injection 3 mL    Star Rating Drugs: ATORVASTATIN 40MG - Last filled 02/26/2018 for 90 day supply at [facility]. FARXIGA 10MG - Last filled 03/19/2020 for 30 day supply at [facility]. LOSARTAN 100MG  - Last filled 09/05/2020 for 90 day supply at [facility]. LOSARTAN/HCTZ 100-25MG  TAB- Last filled 01/11/2018 for 30 day supply at [facility]. ROSUVASTATIN CAL 5MG  - Last filled 09/29/2020 for 84 day supply at [facility]. OZEMPIC 0.25/0.5 PEN- Last 09/05/2020 for 84 day supply at [facility]. METFORMIN 500 MG- Last filled 08/23/2020 for 90 day supply at [facility].   SIG: Pattricia Boss, [place] Pharmacist Assistant [phone]

## 2020-11-08 ENCOUNTER — Telehealth: Payer: Self-pay

## 2020-11-08 NOTE — Chronic Care Management (AMB) (Signed)
Chronic Care Management Pharmacy Assistant   Name: Ronald Faulkner  MRN: [id number] DOB: July 07, 1950   Reason for Encounter: Chart Review For CPP visit on 11-13-2020   Conditions to be addressed/monitored: HTN and DMII   Recent office visits:  09-05-2020 Ronald Faulkner, [place]. CHANGE Metformin 500 MG twice daily with meals TO 500 MG daily with breakfast. CHANGE Ozempic 0.25 Mg weekly TO 0.5 MG weekly. Prevnar 20 and Tetanus injection given.  07-23-2020 Ronald Faulkner, [place]. CHANGE Losartan 50 MG daily TO 50 MG one tablet in morning and one half tablet at night. Start Nasonex 50 MCG 2 sprays into nostrils daily.  06-05-2020 Ronald Faulkner, [place]. Patient reported not taking Ferrous Sulfate, Singulair and Prednisone. START Nitrostat 0.4 DISSOLVE ONE TABLET UNDER THE TONGUE EVERY 5 MINUTES AS NEEDED FOR CHEST PAIN.  DO NOT EXCEED A TOTAL OF 3 DOSES IN 15 MINUTES.  Recent consult visits:  09-21-2020 Ronald Ellen., NP (Cardiology)  09-07-2020 Ronald Laughter, RN [facility]). Blood pressure check  08-24-2020 Ronald Ellen., NP (Cardiology). CHANGE Losartan 50 MG one tablet in morning and one half tablet at night TO 50 MG twice daily. EKG ordered.  07-27-2020 Ronald Ellen., NP (Cardiology). Patient reported not taking Farxiga 10 MG daily.  06-12-2020 XR [facility] visits:  None in previous 6 months  Medications: Outpatient Encounter Medications as of 11/08/2020  Medication Sig   allopurinol (ZYLOPRIM) 300 MG tablet Take 1 tablet (300 mg total) by mouth daily.   amLODipine (NORVASC) 5 MG tablet Take 1 tablet (5 mg total) by mouth daily.   aspirin EC 81 MG tablet Take 81 mg by mouth daily.   diphenhydrAMINE (BENADRYL) 50 MG tablet Take 1 tablet (50 mg total) by mouth as needed (once per cath instructions).   ezetimibe (ZETIA) 10 MG tablet Take 1 tablet (10 mg total) by mouth daily.   Insulin Pen Needle (PEN NEEDLES) 31G X 6 MM MISC Use as  directed with ozempic   Insulin Pen Needle 32G X 6 MM MISC Use with ozempic   isosorbide mononitrate (IMDUR) 60 MG 24 hr tablet Take 1 tablet by mouth once daily   linaclotide (LINZESS) 72 MCG capsule Take 72 mcg by mouth every other day.   losartan (COZAAR) 100 MG tablet Take 1 tablet (100 mg total) by mouth daily.   metFORMIN (GLUCOPHAGE) 500 MG tablet Take 1 tablet (500 mg total) by mouth daily with breakfast.   metoprolol succinate (TOPROL-XL) 25 MG 24 hr tablet TAKE 1 & 1/2 (ONE & ONE-HALF) TABLETS BY MOUTH ONCE DAILY   mometasone (NASONEX) 50 MCG/ACT nasal spray Place 2 sprays into the nose daily.   nitroGLYCERIN (NITROSTAT) 0.4 MG SL tablet DISSOLVE ONE TABLET UNDER THE TONGUE EVERY 5 MINUTES AS NEEDED FOR CHEST PAIN.&nbsp;&nbsp;DO NOT EXCEED A TOTAL OF 3 DOSES IN 15 MINUTES   oxymetazoline (AFRIN) 0.05 % nasal spray Place 1 spray into both nostrils 2 (two) times daily as needed for congestion.   pantoprazole (PROTONIX) 40 MG tablet TAKE 1 TABLET BY MOUTH ONCE DAILY BEFORE BREAKFAST   rosuvastatin (CRESTOR) 5 MG tablet BY MOUTH ON MONDAY,&wEDNESDAY & FRIDAY   Semaglutide,0.25 or 0.5MG /DOS, (OZEMPIC, 0.25 OR 0.5 MG/DOSE,) 2 MG/1.5ML SOPN Inject 0.5 mg into the skin once a week.   Facility-Administered Encounter Medications as of 11/08/2020  Medication   sodium chloride flush (NS) 0.9 % injection 3 mL   Have you seen any other providers  since your last visit? Patient stated no Any changes in your medications or health? Patient stated no Any side effects from any medications? Patient stated no Do you have an symptoms or problems not managed by your medications? Patient stated no Any concerns about your health right now? Patient stated no Has your provider asked that you check blood pressure, blood sugar, or follow special diet at home? Patient stated he checks blood pressure and blood sugar once daily Do you get any type of exercise on a regular basis? Patient stated he walks often and  works around his house. Can you think of a goal you would like to reach for your health? Patient stated not at the moment Do you have any problems getting your medications? Patient stated no just needing to fill out patient assistance application for Ozempic. Is there anything that you would like to discuss during the appointment? Patient stated he can't think of anything at the moment.  Please bring medications and supplements to appointment.  Star Rating Drugs: Ozempic 0.5 MG- Last filled 09-05-2020 84 DS [facility] (Will be using Patient assistance after application is completed) Losartan 100 MG- Last filled 09-05-2020 90 DS [facility]. Rosuvastatin 5 MG- Last filled 09-29-2020 84 DS [facility] Metformin 500 MG- Last filled 08-23-2020 90 DS [place] Clinical Pharmacist Assistant [phone]

## 2020-11-12 ENCOUNTER — Telehealth: Payer: Self-pay

## 2020-11-12 NOTE — Chronic Care Management (AMB) (Signed)
  Patient aware of telephone appointment with [name] on 11-13-2020 at 2:30.Patient aware to have/bring all medications, supplements, blood pressure and/or blood sugar logs to visit.   [place] Pharmacist Assistant [phone]

## 2020-11-13 ENCOUNTER — Ambulatory Visit (INDEPENDENT_AMBULATORY_CARE_PROVIDER_SITE_OTHER): Payer: Medicare HMO

## 2020-11-13 DIAGNOSIS — E119 Type 2 diabetes mellitus without complications: Secondary | ICD-10-CM

## 2020-11-13 DIAGNOSIS — I10 Essential (primary) hypertension: Secondary | ICD-10-CM | POA: Diagnosis not present

## 2020-11-13 DIAGNOSIS — I25119 Atherosclerotic heart disease of native coronary artery with unspecified angina pectoris: Secondary | ICD-10-CM | POA: Diagnosis not present

## 2020-11-13 NOTE — Progress Notes (Addendum)
Chronic Care Management Pharmacy Note  11/29/2020 Name:  Ronald Faulkner MRN:  [id number] DOB:  [birth year]  Summary: Patient would like to stop taking to much medication. His goal is to cut back on the medications he is currently taking.   Recommendations/Changes made from today's visit: Recommend patient apply for patient assistance for Ozempic. Recommend patient receive COVID-19 booster.   Plan: Patient to bring financial information with him and sign patient assistance paper work. Start checking BP twice per week, log and document to bring to future office visits.    Subjective: Ronald Faulkner is an 70 y.o. year old male who is a primary patient of [name], [place].  The CCM team was consulted for assistance with disease management and care coordination needs.  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 He has five children, 12 grand children and 3 great grandchildren.   Engaged with patient by telephone for initial visit in response to provider referral for pharmacy case management and/or care coordination services.   Consent to Services:  The patient was given the following information about Chronic Care Management services today, agreed to services, and gave verbal consent: 1. CCM service includes personalized support from designated clinical staff supervised by the primary care provider, including individualized plan of care and coordination with other care providers 2. 24/7 contact phone numbers for assistance for urgent and routine care needs. 3. Service will only be billed when office clinical staff spend 20 minutes or more in a month to coordinate care. 4. Only one practitioner may furnish and bill the service in a calendar month. 5.The patient may stop CCM services at any time (effective at the end of the month) by phone call to the office staff. 6. The patient will be responsible for cost sharing (co-pay) of up to 20% of the service fee (after annual deductible is  met). Patient agreed to services and consent obtained.  Patient Care Team: [name], [place] as PCP - General (General Practice) Mayford Knife, [facility] (Pharmacist)   Recent office visits:  09-05-2020 [name], [place]. CHANGE Metformin 500 MG twice daily with meals TO 500 MG daily with breakfast. CHANGE Ozempic 0.25 Mg weekly TO 0.5 MG weekly. Prevnar 20 and Tetanus injection given.   07-23-2020 [name], [place]. CHANGE Losartan 50 MG daily TO 50 MG one tablet in morning and one half tablet at night. Start Nasonex 50 MCG 2 sprays into nostrils daily.   06-05-2020 [name], [place]. Patient reported not taking Ferrous Sulfate, Singulair and Prednisone. START Nitrostat 0.4 DISSOLVE ONE TABLET UNDER THE TONGUE EVERY 5 MINUTES AS NEEDED FOR CHEST PAIN.  DO NOT EXCEED A TOTAL OF 3 DOSES IN 15 MINUTES.   Recent consult visits:  09-21-2020 Verta Ellen., NP (Cardiology)   09-07-2020 Merlene Laughter, RN [facility]). Blood pressure check   08-24-2020 Verta Ellen., NP (Cardiology). CHANGE Losartan 50 MG one tablet in morning and one half tablet at night TO 50 MG twice daily. EKG ordered.   07-27-2020 Verta Ellen., NP (Cardiology). Patient reported not taking Farxiga 10 MG daily.   06-12-2020 XR [facility] visits:  None in previous 6 months   Objective:  Lab Results  Component Value Date   CREATININE 1.09 11/27/2020   BUN 13 11/27/2020   GFRNONAA 81 06/05/2020   GFRAA 93 06/05/2020   NA 142 11/27/2020   K 3.5 11/27/2020  CALCIUM 9.4 11/27/2020   CO2 19 (L) 11/27/2020   GLUCOSE 107 (H) 11/27/2020    Lab Results  Component Value Date/Time   HGBA1C 6.7 (H) 11/27/2020 10:34 AM   HGBA1C 6.9 (H) 09/11/2020 08:53 AM   MICROALBUR 150 09/05/2020 03:51 PM    Last diabetic Eye exam:  Lab Results  Component Value Date/Time   HMDIABEYEEXA No Retinopathy 08/22/2020 12:00 AM    Last diabetic Foot exam: No results found for:  HMDIABFOOTEX   Lab Results  Component Value Date   CHOL 141 09/11/2020   HDL 48 09/11/2020   LDLCALC 69 09/11/2020   TRIG 134 09/11/2020   CHOLHDL 2.9 09/11/2020    Hepatic Function Latest Ref Rng & Units 11/27/2020 09/11/2020 06/05/2020  Total Protein 6.0 - 8.5 g/dL 7.1 7.0 7.3  Albumin 3.8 - 4.8 g/dL 4.3 4.1 4.3  AST 0 - 40 IU/L '19 10 15  ' ALT 0 - 44 IU/L '18 10 11  ' Alk Phosphatase 44 - 121 IU/L 158(H) 150(H) 161(H)  Total Bilirubin 0.0 - 1.2 mg/dL 0.2 0.2 0.3    No results found for: TSH, FREET4  CBC Latest Ref Rng & Units 11/27/2020 06/05/2020 10/27/2019  WBC 3.4 - 10.8 x10E3/uL 4.1 6.7 6.3  Hemoglobin 13.0 - 17.7 g/dL 13.1 13.4 12.7(L)  Hematocrit 37.5 - 51.0 % 40.3 41.1 40.6  Platelets 150 - 450 x10E3/uL 219 260 252    No results found for: VD25OH  Clinical ASCVD: Yes  The 10-year ASCVD risk score Mikey Bussing DC Jr., et al., 2013) is: 29.9%   Values used to calculate the score:     Age: 70 years     Sex: Male     Is Non-Hispanic African American: Yes     Diabetic: Yes     Tobacco smoker: No     Systolic Blood Pressure: 680 mmHg     Is BP treated: Yes     HDL Cholesterol: 48 mg/dL     Total Cholesterol: 141 mg/dL    Depression screen [facility] 2/9 09/05/2020 06/05/2020  Decreased Interest 0 0  Down, Depressed, Hopeless 0 0  PHQ - 2 Score 0 0     Social History   Tobacco Use  Smoking Status Former   Packs/day: 1.00   Years: 35.00   Pack years: 35.00   Types: Cigarettes   Quit date: 06/03/1999   Years since quitting: 21.5  Smokeless Tobacco Never   BP Readings from Last 3 Encounters:  11/27/20 126/76  09/21/20 130/80  09/07/20 (!) 142/80   Pulse Readings from Last 3 Encounters:  11/27/20 81  09/21/20 94  09/07/20 80   Wt Readings from Last 3 Encounters:  11/27/20 153 lb 12.8 oz (69.8 kg)  09/21/20 159 lb (72.1 kg)  09/07/20 159 lb 3.2 oz (72.2 kg)   BMI Readings from Last 3 Encounters:  11/27/20 25.59 kg/m  09/21/20 24.90 kg/m  09/07/20 24.93 kg/m     Assessment/Interventions: Review of patient past medical history, allergies, medications, health status, including review of consultants reports, laboratory and other test data, was performed as part of comprehensive evaluation and provision of chronic care management services.   SDOH:  (Social Determinants of Health) assessments and interventions performed: Yes SDOH Interventions    Flowsheet Row Most Recent Value  SDOH Interventions   Financial Strain Interventions --  [Ozempic patient assistance program]      SDOH Screenings   Alcohol Screen: Not on file  Depression (PHQ2-9): Low Risk    PHQ-2 Score:  0  Financial Resource Strain: High Risk   Difficulty of Paying Living Expenses: Very hard  Food Insecurity: No Food Insecurity   Worried About Charity fundraiser in the Last Year: Never true   Ran Out of Food in the Last Year: Never true  Housing: Not on file  Physical Activity: Inactive   Days of Exercise per Week: 0 days   Minutes of Exercise per Session: 0 min  Social Connections: Not on file  Stress: No Stress Concern Present   Feeling of Stress : Not at all  Tobacco Use: Medium Risk   Smoking Tobacco Use: Former   Smokeless Tobacco Use: Never  Transportation Needs: No Data processing manager (Medical): No   Lack of Transportation (Non-Medical): No    CCM Care Plan  Allergies  Allergen Reactions   Bee Venom    Fish Allergy Hives and Swelling   Ivp Dye [Iodinated Diagnostic Agents] Swelling    Medications Reviewed Today     Reviewed by Waylan Rocher, CMA (Certified Medical Assistant) on 11/27/20 at [phone]  Med List Status: <None>   Medication Order Taking? Sig Documenting Provider Last Dose Status Informant  allopurinol (ZYLOPRIM) 300 MG tablet [id number] Yes Take 1 tablet (300 mg total) by mouth daily. [name], FNP Taking Active   amLODipine (NORVASC) 5 MG tablet [id number] Yes Take 1 tablet (5 mg total) by mouth daily.  Verta Ellen., NP Taking Active   aspirin EC 81 MG tablet [id number] Yes Take 81 mg by mouth daily. [provider] Taking Active Self  diphenhydrAMINE (BENADRYL) 50 MG tablet [id number] Yes Take 1 tablet (50 mg total) by mouth as needed (once per cath instructions). Herminio Commons, MD Taking Active Self  ezetimibe (ZETIA) 10 MG tablet [id number] Yes Take 1 tablet (10 mg total) by mouth daily. Verta Ellen., NP Taking Active   fluticasone [facility]) 50 MCG/ACT nasal spray [id number] Yes Place 2 sprays into both nostrils daily. [provider] Taking Active   Insulin Pen Needle (PEN NEEDLES) 31G X 6 MM MISC [id number] Yes Use as directed with ozempic [name], FNP Taking Active   Insulin Pen Needle 32G X 6 MM MISC [id number] Yes Use with ozempic [name], FNP Taking Active   isosorbide mononitrate (IMDUR) 60 MG 24 hr tablet [id number] Yes Take 1 tablet by mouth once daily Verta Ellen., NP Taking Active   linaclotide [facility]) 72 MCG capsule [id number] Yes Take 72 mcg by mouth every other day. [provider] Taking Active Self  losartan (COZAAR) 100 MG tablet [id number] Yes Take 1 tablet (100 mg total) by mouth daily. [name], FNP Taking Active   metFORMIN (GLUCOPHAGE) 500 MG tablet [id number] Yes Take 1 tablet (500 mg total) by mouth daily with breakfast. [name], FNP Taking Active   metoprolol succinate (TOPROL-XL) 25 MG 24 hr tablet [id number] Yes TAKE 1 & 1/2 (ONE & ONE-HALF) TABLETS BY MOUTH ONCE DAILY Verta Ellen., NP Taking Active   mometasone (NASONEX) 50 MCG/ACT nasal spray [id number] No Place 2 sprays into the nose daily.  Patient not taking: No sig reported   [name], FNP Not Taking Active   nitroGLYCERIN (NITROSTAT) 0.4 MG SL tablet [id number] Yes DISSOLVE ONE TABLET UNDER THE TONGUE EVERY 5 MINUTES AS NEEDED FOR CHEST PAIN.&nbsp;&nbsp;DO NOT EXCEED A TOTAL OF 3 DOSES IN 15 MINUTES [name], FNP Taking Active    oxymetazoline (AFRIN) 0.05 % nasal spray [id number]  Yes Place 1 spray into both nostrils 2 (two) times daily as needed for congestion. [provider] Taking Active            Med Note Doyce Loose Jul 29, 2017  6:19 PM)    pantoprazole (PROTONIX) 40 MG tablet [id number] Yes TAKE 1 TABLET BY MOUTH ONCE DAILY BEFORE BREAKFAST Montez Morita, Daniel, MD Taking Active   rosuvastatin (CRESTOR) 5 MG tablet [id number] Yes BY MOUTH ON MONDAY,&wEDNESDAY & FRIDAY Verta Ellen., NP Taking Active   Semaglutide,0.25 or 0.5MG/DOS, (OZEMPIC, 0.25 OR 0.5 MG/DOSE,) 2 MG/1.5ML SOPN [id number] Yes Inject 0.5 mg into the skin once a week. [name], FNP Taking Active   sodium chloride flush (NS) 0.9 % injection 3 mL [id number]   Herminio Commons, MD  Active             Patient Active Problem List   Diagnosis Date Noted   Coronary artery disease involving native coronary artery of native heart with angina pectoris ([place]) 06/05/2020   Nausea without vomiting 10/25/2019   Chronic constipation 07/07/2019   History of colonic polyps 07/07/2019   Unstable angina ([place]) 07/29/2017   Type 2 diabetes mellitus ([place]) 05/27/2017   Bilateral lower extremity edema    SOB (shortness of breath)    Family hx of colon cancer 09/18/2015   Gout 09/18/2015   Rectal bleeding 04/05/2012   GERD (gastroesophageal reflux disease) 04/05/2012   Atypical chest pain 12/08/2011   Abnormal EKG 12/08/2011   HTN (hypertension) 12/08/2011    Immunization History  Administered Date(s) Administered   Influenza, High Dose Seasonal PF 05/27/2017   Influenza-Unspecified 03/14/2020   Moderna Sars-Covid-2 Vaccination 07/28/2019, 08/26/2019, 06/05/2020   PNEUMOCOCCAL CONJUGATE-20 09/05/2020   Tdap 09/05/2020    Conditions to be addressed/monitored:  Hypertension, Hyperlipidemia, and Diabetes  Care Plan : [place]  Updates made by Mayford Knife, [employer] since 11/29/2020 12:00 AM      Problem: HTN, HLD, DM II      Goal: Disease Management   This Visit's Progress: On track  Note:     Current Barriers:  Unable to independently afford treatment regimen Unable to independently monitor therapeutic efficacy  Pharmacist Clinical Goal(s):  Patient will verbalize ability to afford treatment regimen achieve adherence to monitoring guidelines and medication adherence to achieve therapeutic efficacy through collaboration with PharmD and provider.   Interventions: 1:1 collaboration with [name], FNP regarding development and update of comprehensive plan of care as evidenced by provider attestation and co-signature Inter-disciplinary care team collaboration (see longitudinal plan of care) Comprehensive medication review performed; medication list updated in electronic medical record  Hypertension (BP goal <130/80) -Controlled -Current treatment: Metoprolol 25 mg take 1 tablet daily  Amlodipine 5 mg tablet daily  -Current dietary habits: please see diabetes for more information. -Current exercise: patient reports that he is walking at least three times per week. He is walking for at least 45 minutes.  -Denies hypotensive/hypertensive symptoms -Educated on Daily salt intake goal < 2300 mg; Exercise goal of 150 minutes per week; Importance of home blood pressure monitoring; Proper BP monitoring technique; -Counseled to monitor BP at home twice per week and, document, and provide log at future appointments -Recommended to continue current medication  Hyperlipidemia: (LDL goal < 70) -Uncontrolled -Current treatment: Rosuvastatin 5 mg tablet on Monday, Wednesday and Friday. -Educated on Cholesterol goals;  Benefits of statin for ASCVD risk reduction; Importance of limiting foods high in cholesterol; -Recommended  to continue current medication  Diabetes (A1c goal <7%) -Controlled -Current medications: Ozempic 0.5 mg injecting once a week  -Current home  glucose readings fasting glucose:98-118 -Denies hypoglycemic/hyperglycemic symptoms -Current meal patterns: Patient reports eating more fast food.  breakfast: boiled egg and oatmeal  lunch: not currently eating lunch  dinner: home cooked meals, will discuss further during next office visit drinks: pepsi - one a day -Current exercise: patient reports that he is walking at least three times per week. He is walking for at least 45 minutes.  -Educated on A1c and blood sugar goals; Complications of diabetes including kidney damage, retinal damage, and cardiovascular disease; Exercise goal of 150 minutes per week; Prevention and management of hypoglycemic episodes; Sick day management: maintain hydration with high fluid intake and call MD if glucose above 400 -Patient would like stop taking so mycg medication  -[place] has no seen a foot doctor in years.  -Counseled to check feet daily and get yearly eye exams -Recommended to continue current medication  Health Maintenance -Vaccine gaps: COVID-19 BOOSTER, Pneumovax  Follow Up Plan: The patient has been provided with contact information for the care management team and has been advised to call with any health related questions or concerns.        Medication Assistance: Application for Ozempic  medication assistance program. in process.  Anticipated assistance start date 12/2020.  See plan of care for additional detail.  Compliance/Adherence/Medication fill history: Care Gaps: Shingles Vaccine Pneumonia  COVID-19 Vaccine   Star-Rating Drugs: Losartan 100 mg Metformin 1000 mg Rosuvastatin 5 mg  Ozempic 0.5 mg  Patient's preferred pharmacy is:  [facility] [address] [place] [address] Phone: [phone] Fax: [phone]  Uses pill box? No - not at this time Pt endorses 85% compliance  We discussed: Benefits of medication synchronization, packaging and delivery as well as enhanced  pharmacist oversight with [employer]. Patient decided to: Continue current medication management strategy  Care Plan and Follow Up Patient Decision:  Patient agrees to Care Plan and Follow-up.  Plan: The patient has been provided with contact information for the care management team and has been advised to call with any health related questions or concerns.   [name], PharmD Clinical Pharmacist [facility] [phone]

## 2020-11-27 ENCOUNTER — Ambulatory Visit (INDEPENDENT_AMBULATORY_CARE_PROVIDER_SITE_OTHER): Payer: Medicare HMO | Admitting: Nurse Practitioner

## 2020-11-27 ENCOUNTER — Encounter: Payer: Self-pay | Admitting: Nurse Practitioner

## 2020-11-27 ENCOUNTER — Other Ambulatory Visit: Payer: Self-pay

## 2020-11-27 VITALS — BP 126/76 | HR 81 | Temp 98.6°F | Ht 65.0 in | Wt 153.8 lb

## 2020-11-27 DIAGNOSIS — I25119 Atherosclerotic heart disease of native coronary artery with unspecified angina pectoris: Secondary | ICD-10-CM | POA: Diagnosis not present

## 2020-11-27 DIAGNOSIS — I10 Essential (primary) hypertension: Secondary | ICD-10-CM

## 2020-11-27 DIAGNOSIS — R197 Diarrhea, unspecified: Secondary | ICD-10-CM | POA: Diagnosis not present

## 2020-11-27 DIAGNOSIS — E119 Type 2 diabetes mellitus without complications: Secondary | ICD-10-CM | POA: Diagnosis not present

## 2020-11-27 DIAGNOSIS — Z8719 Personal history of other diseases of the digestive system: Secondary | ICD-10-CM

## 2020-11-27 LAB — CBC WITH DIFFERENTIAL/PLATELET
Basophils Absolute: 0 10*3/uL (ref 0.0–0.2)
Basos: 1 %
EOS (ABSOLUTE): 0.1 10*3/uL (ref 0.0–0.4)
Eos: 3 %
Hematocrit: 40.3 % (ref 37.5–51.0)
Hemoglobin: 13.1 g/dL (ref 13.0–17.7)
Immature Grans (Abs): 0 10*3/uL (ref 0.0–0.1)
Immature Granulocytes: 0 %
Lymphocytes Absolute: 1.3 10*3/uL (ref 0.7–3.1)
Lymphs: 31 %
MCH: 26.8 pg (ref 26.6–33.0)
MCHC: 32.5 g/dL (ref 31.5–35.7)
MCV: 83 fL (ref 79–97)
Monocytes Absolute: 0.6 10*3/uL (ref 0.1–0.9)
Monocytes: 15 %
Neutrophils Absolute: 2.1 10*3/uL (ref 1.4–7.0)
Neutrophils: 50 %
Platelets: 219 10*3/uL (ref 150–450)
RBC: 4.88 x10E6/uL (ref 4.14–5.80)
RDW: 14.5 % (ref 11.6–15.4)
WBC: 4.1 10*3/uL (ref 3.4–10.8)

## 2020-11-27 LAB — CMP14+EGFR
ALT: 18 IU/L (ref 0–44)
AST: 19 IU/L (ref 0–40)
Albumin/Globulin Ratio: 1.5 (ref 1.2–2.2)
Albumin: 4.3 g/dL (ref 3.8–4.8)
Alkaline Phosphatase: 158 IU/L — ABNORMAL HIGH (ref 44–121)
BUN/Creatinine Ratio: 12 (ref 10–24)
BUN: 13 mg/dL (ref 8–27)
Bilirubin Total: 0.2 mg/dL (ref 0.0–1.2)
CO2: 19 mmol/L — ABNORMAL LOW (ref 20–29)
Calcium: 9.4 mg/dL (ref 8.6–10.2)
Chloride: 102 mmol/L (ref 96–106)
Creatinine, Ser: 1.09 mg/dL (ref 0.76–1.27)
Globulin, Total: 2.8 g/dL (ref 1.5–4.5)
Glucose: 107 mg/dL — ABNORMAL HIGH (ref 65–99)
Potassium: 3.5 mmol/L (ref 3.5–5.2)
Sodium: 142 mmol/L (ref 134–144)
Total Protein: 7.1 g/dL (ref 6.0–8.5)
eGFR: 73 mL/min/{1.73_m2} (ref >59)

## 2020-11-27 LAB — HEMOGLOBIN A1C
Est. average glucose Bld gHb Est-mCnc: 146 mg/dL
Hgb A1c MFr Bld: 6.7 % — ABNORMAL HIGH (ref 4.8–5.6)

## 2020-11-27 NOTE — Patient Instructions (Signed)

## 2020-11-27 NOTE — Progress Notes (Signed)
I,Yamilka Roman [employer] as a Neurosurgeon for [employer], FNP.,have documented all relevant documentation on the behalf of [name], FNP,as directed by  [name], FNP while in the presence of [name], FNP.  This visit occurred during the SARS-CoV-2 public health emergency.  Safety protocols were in place, including screening questions prior to the visit, additional usage of staff PPE, and extensive cleaning of exam room while observing appropriate contact time as indicated for disinfecting solutions.  Subjective:     Patient ID: Ronald Faulkner , male    DOB: 1951-05-12 , 70 y.o.   MRN: [id number]   Chief Complaint  Patient presents with   Hypertension   Diarrhea    Patient stated he has had some diarrhea since Friday.    Diabetes    HPI  Patient presents today for a blood pressure and diabetes f/u. He also reports having diarrhea since Friday. He stated whenever he eats he finds himself going to the bathroom an hour later.  Wt Readings from Last 3 Encounters: 11/27/20 : 153 lb 12.8 oz (69.8 kg) 09/21/20 : 159 lb (72.1 kg) 09/07/20 : 159 lb 3.2 oz (72.2 kg)   He had his 2nd booster shot on Tues. He took a covid test 2 days ago which was negative. Everything he eats he will have diarrhea. He had been constipated then he took a Linzess on Thursday, Friday he ate Sausage and gravy biscuit from [employer], Friday night ate green beans and chicken on the grill, sliced cucumbers. On Saturday did not eat due to feeling bad did mostly liquids. Sunday he had a boiled egg, cheese crackers Monday he tried to eat spaghetti.  He sees Dr. Dionicia Abler (GI) in [place], he is awaiting a return call. He has a history of diverticulitis and constipation.  Denies abdominal pain. Slight pains in his chest - this has been ongoing for quite some time. He is followed by a Cardiologist. He does report having stents in his heart.    Hypertension This is a chronic problem. The current episode started 1  to 4 weeks ago. The problem has been gradually worsening since onset. The problem is uncontrolled. Pertinent negatives include no anxiety, blurred vision, chest pain, headaches, malaise/fatigue, palpitations, peripheral edema or shortness of breath. There are no associated agents to hypertension. Risk factors for coronary artery disease include sedentary lifestyle. Past treatments include angiotensin blockers, calcium channel blockers and direct vasodilators. The current treatment provides moderate improvement. There are no compliance problems.  There is no history of angina. There is no history of chronic renal disease.  Diarrhea  This is a new problem. The current episode started in the past 7 days. Pertinent negatives include no coughing or headaches.  Diabetes He presents for his follow-up diabetic visit. He has type 2 diabetes mellitus. Pertinent negatives for hypoglycemia include no dizziness or headaches. Pertinent negatives for diabetes include no blurred vision and no chest pain. Risk factors for coronary artery disease include diabetes mellitus, male sex, sedentary lifestyle and hypertension. Current diabetic treatment includes oral agent (dual therapy) (Ozempic and metformin). He has not had a previous visit with a dietitian. He rarely participates in exercise. (His last blood sugar was 113.  )    Past Medical History:  Diagnosis Date   Arthritis    CAD (coronary artery disease)    a. 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    Unstable angina ([place]) 07/2017     Family History  Problem Relation Age of Onset   Colon cancer Mother     Coronary artery disease Maternal Grandmother    Anesthesia problems Neg Hx    Hypotension Neg Hx    Malignant hyperthermia Neg Hx    Pseudochol deficiency Neg Hx      Current Outpatient Medications:    allopurinol (ZYLOPRIM) 300 MG tablet, Take 1 tablet (300 mg total) by mouth daily., Disp: 90 tablet, Rfl: 1   amLODipine (NORVASC) 5 MG tablet, Take 1 tablet (5 mg total) by mouth daily., Disp: 30 tablet, Rfl: 6   aspirin EC 81 MG tablet, Take 81 mg by mouth daily., Disp: , Rfl:    diphenhydrAMINE (BENADRYL) 50 MG tablet, Take 1 tablet (50 mg total) by mouth as needed (once per cath instructions)., Disp: 1 tablet, Rfl: 0   ezetimibe (ZETIA) 10 MG tablet, Take 1 tablet (10 mg total) by mouth daily., Disp: 30 tablet, Rfl: 6   fluticasone (FLONASE) 50 MCG/ACT nasal spray, Place 2 sprays into both nostrils daily., Disp: , Rfl:    Insulin Pen Needle (PEN NEEDLES) 31G X 6 MM MISC, Use as directed with ozempic, Disp: 50 each, Rfl: 2   Insulin Pen Needle 32G X 6 MM MISC, Use with ozempic, Disp: 50 each, Rfl: 3   isosorbide mononitrate (IMDUR) 60 MG 24 hr tablet, Take 1 tablet by mouth once daily, Disp: 90 tablet, Rfl: 3   linaclotide (LINZESS) 72 MCG capsule, Take 72 mcg by mouth every other day., Disp: , Rfl:    losartan (COZAAR) 100 MG tablet, Take 1 tablet (100 mg total) by mouth daily., Disp: 90 tablet, Rfl: 1   metFORMIN (GLUCOPHAGE) 500 MG tablet, Take 1 tablet (500 mg total) by mouth daily with breakfast., Disp: 90 tablet, Rfl: 1   metoprolol succinate (TOPROL-XL) 25 MG 24 hr tablet, TAKE 1 & 1/2 (ONE & ONE-HALF) TABLETS BY MOUTH ONCE DAILY, Disp: 135 tablet, Rfl: 2   nitroGLYCERIN (NITROSTAT) 0.4 MG SL tablet, DISSOLVE ONE TABLET UNDER THE TONGUE EVERY 5 MINUTES AS NEEDED FOR CHEST PAIN.&nbsp;&nbsp;DO NOT EXCEED A TOTAL OF 3 DOSES IN 15 MINUTES, Disp: 25 tablet, Rfl: 0   oxymetazoline (AFRIN) 0.05 % nasal spray, Place 1 spray into both nostrils 2 (two) times daily as needed for congestion.,  Disp: , Rfl:    pantoprazole (PROTONIX) 40 MG tablet, TAKE 1 TABLET BY MOUTH ONCE DAILY BEFORE BREAKFAST, Disp: 90 tablet, Rfl: 0   rosuvastatin (CRESTOR) 5 MG tablet, BY MOUTH ON MONDAY,&wEDNESDAY & FRIDAY, Disp: 36 tablet, Rfl: 3   Semaglutide,0.25 or 0.5MG /DOS, (OZEMPIC, 0.25 OR 0.5 MG/DOSE,) 2 MG/1.5ML SOPN, Inject 0.5 mg into the skin once a week., Disp: 4.5 mL, Rfl: 3   mometasone (NASONEX) 50 MCG/ACT nasal spray, Place 2 sprays into the nose daily. (Patient not taking: No sig reported), Disp: 1 each, Rfl: 2  Current Facility-Administered Medications:    sodium chloride flush (NS) 0.9 % injection 3 mL, 3 mL, Intravenous, Q12H, Bronson Ing, Lenise Herald, MD   Allergies  Allergen Reactions   Bee Venom    Fish Allergy Hives and Swelling   Ivp  Dye [Iodinated Diagnostic Agents] Swelling     Review of Systems  Constitutional: Negative.  Negative for malaise/fatigue.  Eyes:  Negative for blurred vision.  Respiratory: Negative.  Negative for cough and shortness of breath.   Cardiovascular:  Negative for chest pain, palpitations and leg swelling.  Gastrointestinal:  Positive for diarrhea.  Musculoskeletal: Negative.   Neurological:  Negative for dizziness and headaches.  Psychiatric/Behavioral: Negative.      Today's Vitals   11/27/20 0852  BP: 126/76  Pulse: 81  Temp: 98.6 F (37 C)  Weight: 153 lb 12.8 oz (69.8 kg)  Height: 5\' 5"  (1.651 m)  PainSc: 3   PainLoc: Leg   Body mass index is 25.59 kg/m.   Objective:  Physical Exam Constitutional:      General: He is not in acute distress.    Appearance: Normal appearance. He is normal weight.  Cardiovascular:     Rate and Rhythm: Normal rate and regular rhythm.     Pulses: Normal pulses.     Heart sounds: Normal heart sounds. No murmur heard. Pulmonary:     Effort: Pulmonary effort is normal. No respiratory distress.     Breath sounds: Normal breath sounds. No wheezing.  Abdominal:     General: Abdomen is flat. Bowel sounds  are normal. There is no distension.     Palpations: Abdomen is soft. There is no mass.     Tenderness: There is no abdominal tenderness.     Comments: Hyperactive bowel sounds  Musculoskeletal:        General: Normal range of motion.     Cervical back: Normal range of motion.  Skin:    General: Skin is warm and dry.     Capillary Refill: Capillary refill takes less than 2 seconds.  Neurological:     General: No focal deficit present.     Mental Status: He is alert and oriented to person, place, and time.     Cranial Nerves: No cranial nerve deficit.     Motor: No weakness.  Psychiatric:        Mood and Affect: Mood normal.        Behavior: Behavior normal.        Thought Content: Thought content normal.        Judgment: Judgment normal.        Assessment And Plan:     1. Primary hypertension Chronic, excellent control Continue follow up with Cardiology  2. Type 2 diabetes mellitus without complication, without long-term current use of insulin (HCC) Chronic, controlled Continue with current medications Encouraged to limit intake of sugary foods and drinks Encouraged to increase physical activity to 150 minutes per week as tolerated - CMP14+EGFR - Hemoglobin A1c  3. Coronary artery disease involving native coronary artery of native heart with angina pectoris ([place]) Continue with follow up with Cardiology  4. Diarrhea, unspecified type Will check for covid and for infection Encouraged to eat a bland diet He is also encouraged to drink gatorade - Novel Coronavirus, NAA ([facility]) - CBC with Differential/Platelet  5. History of diverticulitis  Negative for abdomen pain  Patient was given opportunity to ask questions. Patient verbalized understanding of the plan and was able to repeat key elements of the plan. All questions were answered to their satisfaction.  [name], FNP   I, [name], FNP, have reviewed all documentation for this visit. The documentation on  11/27/20 for the exam, diagnosis, procedures, and orders are all accurate and complete.  IF YOU HAVE BEEN REFERRED TO A SPECIALIST, IT MAY TAKE 1-2 WEEKS TO SCHEDULE/PROCESS THE REFERRAL. IF YOU HAVE NOT HEARD FROM US/SPECIALIST IN TWO WEEKS, PLEASE GIVE [place] A CALL AT [phone].   THE PATIENT IS ENCOURAGED TO PRACTICE SOCIAL DISTANCING DUE TO THE COVID-19 PANDEMIC.

## 2020-11-28 LAB — NOVEL CORONAVIRUS, NAA: SARS-CoV-2, NAA: NOT DETECTED

## 2020-11-28 LAB — SARS-COV-2, NAA 2 DAY TAT

## 2020-11-29 NOTE — Patient Instructions (Signed)
Visit Information It was great speaking with you today!  Please let me know if you have any questions about our visit.   Goals Addressed   None     There are no care plans to display for this patient.    Ronald Faulkner was given information about Chronic Care Management services today including:  CCM service includes personalized support from designated clinical staff supervised by his physician, including individualized plan of care and coordination with other care providers 24/7 contact phone numbers for assistance for urgent and routine care needs. Standard insurance, coinsurance, copays and deductibles apply for chronic care management only during months in which we provide at least 20 minutes of these services. Most insurances cover these services at 100%, however patients may be responsible for any copay, coinsurance and/or deductible if applicable. This service may help you avoid the need for more expensive face-to-face services. Only one practitioner may furnish and bill the service in a calendar month. The patient may stop CCM services at any time (effective at the end of the month) by phone call to the office staff.  Patient agreed to services and verbal consent obtained.   The patient verbalized understanding of instructions, educational materials, and care plan provided today and agreed to receive a mailed copy of patient instructions, educational materials, and care plan.   [name], PharmD Clinical Pharmacist [facility] [phone]

## 2020-11-30 ENCOUNTER — Telehealth: Payer: Self-pay

## 2020-11-30 NOTE — Telephone Encounter (Signed)
Left the patient a message to call back for lab results. 

## 2020-11-30 NOTE — Telephone Encounter (Signed)
-----   Message from [name], [place] sent at 11/29/2020 12:44 PM EDT ----- You are negative for covid. Kidney and liver functions are normal. Alkaline phos is elevated, are you eating processed foods? HgbA1c is down to 6.7 from 6.9, continue eating healthy and regular exercise. Blood levels are normal. No anemia or infection

## 2020-12-04 ENCOUNTER — Encounter: Payer: Self-pay | Admitting: Nurse Practitioner

## 2020-12-04 ENCOUNTER — Ambulatory Visit: Payer: Medicare HMO | Admitting: Nurse Practitioner

## 2020-12-18 ENCOUNTER — Telehealth: Payer: Self-pay

## 2020-12-18 NOTE — Chronic Care Management (AMB) (Signed)
   No answer, left message of telephone appointment with [name] CPP on 12-19-2020 at 3:15. Left message to have all medications, supplements, blood pressure and/or blood sugar logs available during appointment and to return call if need to reschedule.   [place] Pharmacist Assistant [phone]

## 2020-12-19 ENCOUNTER — Ambulatory Visit (INDEPENDENT_AMBULATORY_CARE_PROVIDER_SITE_OTHER): Payer: Medicare HMO

## 2020-12-19 DIAGNOSIS — I10 Essential (primary) hypertension: Secondary | ICD-10-CM

## 2020-12-19 DIAGNOSIS — E119 Type 2 diabetes mellitus without complications: Secondary | ICD-10-CM | POA: Diagnosis not present

## 2020-12-19 NOTE — Progress Notes (Signed)
Chronic Care Management Pharmacy Note  12/19/2020 Name:  Ronald Faulkner MRN:  [id number] DOB:  Feb 02, 1951  Summary: Patient reports that he is doing well but is concerned about his financial assistance with Ozempic  Recommendations/Changes made from today's visit: Recommend patient receive COVID-19  vaccine booster   Plan: Patient to get COVID-19 booster at [facility]. Collaborate with pharmacy team to determine patient assistance status.    Subjective: Ronald Faulkner is an 70 y.o. year old male who is a primary patient of [name], [place].  The CCM team was consulted for assistance with disease management and care coordination needs.    Engaged with patient by telephone for follow up visit in response to provider referral for pharmacy case management and/or care coordination services. Patient reports that he is concerned about his Metformin and people keep telling him to get off it. He reports taking 12 to 14 pills per day and he wants to be off some of the medication.   Consent to Services:  The patient was given information about Chronic Care Management services, agreed to services, and gave verbal consent prior to initiation of services.  Please see initial visit note for detailed documentation.   Patient Care Team: [name], FNP as PCP - General (General Practice) Mayford Knife, [facility] (Pharmacist)  Recent office visits: 09/05/2020 PCP OV  Recent consult visits: 09/21/2020 Cardiology OV 08/24/2020 Cardiology [facility] visits: None in previous 6 months   Objective:  Lab Results  Component Value Date   CREATININE 1.09 11/27/2020   BUN 13 11/27/2020   GFRNONAA 81 06/05/2020   GFRAA 93 06/05/2020   NA 142 11/27/2020   K 3.5 11/27/2020   CALCIUM 9.4 11/27/2020   CO2 19 (L) 11/27/2020   GLUCOSE 107 (H) 11/27/2020    Lab Results  Component Value Date/Time   HGBA1C 6.7 (H) 11/27/2020 10:34 AM   HGBA1C 6.9 (H) 09/11/2020 08:53 AM   MICROALBUR 150  09/05/2020 03:51 PM    Last diabetic Eye exam:  Lab Results  Component Value Date/Time   HMDIABEYEEXA No Retinopathy 08/22/2020 12:00 AM    Last diabetic Foot exam: No results found for: HMDIABFOOTEX   Lab Results  Component Value Date   CHOL 141 09/11/2020   HDL 48 09/11/2020   LDLCALC 69 09/11/2020   TRIG 134 09/11/2020   CHOLHDL 2.9 09/11/2020    Hepatic Function Latest Ref Rng & Units 11/27/2020 09/11/2020 06/05/2020  Total Protein 6.0 - 8.5 g/dL 7.1 7.0 7.3  Albumin 3.8 - 4.8 g/dL 4.3 4.1 4.3  AST 0 - 40 IU/L '19 10 15  ' ALT 0 - 44 IU/L '18 10 11  ' Alk Phosphatase 44 - 121 IU/L 158(H) 150(H) 161(H)  Total Bilirubin 0.0 - 1.2 mg/dL 0.2 0.2 0.3    No results found for: TSH, FREET4  CBC Latest Ref Rng & Units 11/27/2020 06/05/2020 10/27/2019  WBC 3.4 - 10.8 x10E3/uL 4.1 6.7 6.3  Hemoglobin 13.0 - 17.7 g/dL 13.1 13.4 12.7(L)  Hematocrit 37.5 - 51.0 % 40.3 41.1 40.6  Platelets 150 - 450 x10E3/uL 219 260 252    No results found for: VD25OH  Clinical ASCVD: Yes  The 10-year ASCVD risk score Mikey Bussing DC Jr., et al., 2013) is: 29.9%   Values used to calculate the score:     Age: 70 years     Sex: Male     Is Non-Hispanic African American: Yes     Diabetic: Yes  Tobacco smoker: No     Systolic Blood Pressure: 403 mmHg     Is BP treated: Yes     HDL Cholesterol: 48 mg/dL     Total Cholesterol: 141 mg/dL    Depression screen [facility] 2/9 09/05/2020 06/05/2020  Decreased Interest 0 0  Down, Depressed, Hopeless 0 0  PHQ - 2 Score 0 0     Social History   Tobacco Use  Smoking Status Former   Packs/day: 1.00   Years: 35.00   Pack years: 35.00   Types: Cigarettes   Quit date: 06/03/1999   Years since quitting: 21.5  Smokeless Tobacco Never   BP Readings from Last 3 Encounters:  11/27/20 126/76  09/21/20 130/80  09/07/20 (!) 142/80   Pulse Readings from Last 3 Encounters:  11/27/20 81  09/21/20 94  09/07/20 80   Wt Readings from Last 3 Encounters:  11/27/20 153 lb 12.8 oz  (69.8 kg)  09/21/20 159 lb (72.1 kg)  09/07/20 159 lb 3.2 oz (72.2 kg)   BMI Readings from Last 3 Encounters:  11/27/20 25.59 kg/m  09/21/20 24.90 kg/m  09/07/20 24.93 kg/m    Assessment/Interventions: Review of patient past medical history, allergies, medications, health status, including review of consultants reports, laboratory and other test data, was performed as part of comprehensive evaluation and provision of chronic care management services.   SDOH:  (Social Determinants of Health) assessments and interventions performed: No  SDOH Screenings   Alcohol Screen: Not on file  Depression (PHQ2-9): Low Risk    PHQ-2 Score: 0  Financial Resource Strain: High Risk   Difficulty of Paying Living Expenses: Very hard  Food Insecurity: No Food Insecurity   Worried About Charity fundraiser in the Last Year: Never true   Ran Out of Food in the Last Year: Never true  Housing: Not on file  Physical Activity: Inactive   Days of Exercise per Week: 0 days   Minutes of Exercise per Session: 0 min  Social Connections: Not on file  Stress: No Stress Concern Present   Feeling of Stress : Not at all  Tobacco Use: Medium Risk   Smoking Tobacco Use: Former   Smokeless Tobacco Use: Never  Transportation Needs: No Transportation Needs   Lack of Transportation (Medical): No   Lack of Transportation (Non-Medical): No    CCM Care Plan  Allergies  Allergen Reactions   Bee Venom    Fish Allergy Hives and Swelling   Ivp Dye [Iodinated Diagnostic Agents] Swelling    Medications Reviewed Today     Reviewed by [name], FNP (Family Nurse Practitioner) on 12/04/20 at 2234  Med List Status: <None>   Medication Order Taking? Sig Documenting Provider Last Dose Status Informant  allopurinol (ZYLOPRIM) 300 MG tablet [id number] Yes Take 1 tablet (300 mg total) by mouth daily. [name], FNP Taking Active   amLODipine (NORVASC) 5 MG tablet [id number] Yes Take 1 tablet (5 mg total) by  mouth daily. Verta Ellen., NP Taking Active   aspirin EC 81 MG tablet [id number] Yes Take 81 mg by mouth daily. [provider] Taking Active Self  diphenhydrAMINE (BENADRYL) 50 MG tablet [id number] Yes Take 1 tablet (50 mg total) by mouth as needed (once per cath instructions). Herminio Commons, MD Taking Active Self  ezetimibe (ZETIA) 10 MG tablet [id number] Yes Take 1 tablet (10 mg total) by mouth daily. Verta Ellen., NP Taking Expired 12/03/20 2359   fluticasone (FLONASE) 50 MCG/ACT nasal  spray [id number] Yes Place 2 sprays into both nostrils daily. [provider] Taking Active   Insulin Pen Needle (PEN NEEDLES) 31G X 6 MM MISC [id number] Yes Use as directed with ozempic [name], FNP Taking Active   Insulin Pen Needle 32G X 6 MM MISC [id number] Yes Use with ozempic [name], FNP Taking Active   isosorbide mononitrate (IMDUR) 60 MG 24 hr tablet [id number] Yes Take 1 tablet by mouth once daily Verta Ellen., NP Taking Active   linaclotide [facility]) 72 MCG capsule [id number] Yes Take 72 mcg by mouth every other day. [provider] Taking Active Self  losartan (COZAAR) 100 MG tablet [id number] Yes Take 1 tablet (100 mg total) by mouth daily. [name], FNP Taking Active   metFORMIN (GLUCOPHAGE) 500 MG tablet [id number] Yes Take 1 tablet (500 mg total) by mouth daily with breakfast. [name], FNP Taking Active   metoprolol succinate (TOPROL-XL) 25 MG 24 hr tablet [id number] Yes TAKE 1 & 1/2 (ONE & ONE-HALF) TABLETS BY MOUTH ONCE DAILY Verta Ellen., NP Taking Active   mometasone (NASONEX) 50 MCG/ACT nasal spray [id number] No Place 2 sprays into the nose daily.  Patient not taking: No sig reported   [name], FNP Not Taking Active   nitroGLYCERIN (NITROSTAT) 0.4 MG SL tablet [id number] Yes DISSOLVE ONE TABLET UNDER THE TONGUE EVERY 5 MINUTES AS NEEDED FOR CHEST PAIN.&nbsp;&nbsp;DO NOT EXCEED A TOTAL OF 3 DOSES IN 15 MINUTES [name], FNP Taking Active   oxymetazoline (AFRIN) 0.05 % nasal spray [id number] Yes Place 1 spray into both nostrils 2 (two) times daily as needed for congestion. [provider] Taking Active            Med Note Doyce Loose Jul 29, 2017  6:19 PM)    pantoprazole (PROTONIX) 40 MG tablet [id number] Yes TAKE 1 TABLET BY MOUTH ONCE DAILY BEFORE BREAKFAST Montez Morita, Daniel, MD Taking Active   rosuvastatin (CRESTOR) 5 MG tablet [id number] Yes BY MOUTH ON MONDAY,&wEDNESDAY & FRIDAY Verta Ellen., NP Taking Active   Semaglutide,0.25 or 0.5MG/DOS, (OZEMPIC, 0.25 OR 0.5 MG/DOSE,) 2 MG/1.5ML SOPN [id number] Yes Inject 0.5 mg into the skin once a week. [name], FNP Taking Active   sodium chloride flush (NS) 0.9 % injection 3 mL [id number]   Herminio Commons, MD  Active             Patient Active Problem List   Diagnosis Date Noted   Coronary artery disease involving native coronary artery of native heart with angina pectoris ([place]) 06/05/2020   Nausea without vomiting 10/25/2019   Chronic constipation 07/07/2019   History of colonic polyps 07/07/2019   Unstable angina ([place]) 07/29/2017   Type 2 diabetes mellitus ([place]) 05/27/2017   Bilateral lower extremity edema    SOB (shortness of breath)    Family hx of colon cancer 09/18/2015   Gout 09/18/2015   Rectal bleeding 04/05/2012   GERD (gastroesophageal reflux disease) 04/05/2012   Atypical chest pain 12/08/2011   Abnormal EKG 12/08/2011   HTN (hypertension) 12/08/2011    Immunization History  Administered Date(s) Administered   Influenza, High Dose Seasonal PF 05/27/2017   Influenza-Unspecified 03/14/2020   Moderna Sars-Covid-2 Vaccination 07/28/2019, 08/26/2019, 06/05/2020   PNEUMOCOCCAL CONJUGATE-20 09/05/2020   Tdap 09/05/2020    Conditions to be addressed/monitored:  Hypertension and Diabetes  Care Plan : [place]  Updates made by Mayford Knife, [place] since 12/26/2020 12:00  AM      Problem: HTN, CAD, DM II      Goal: Disease Management   This Visit's Progress: On track  Recent Progress: On track  Priority: High  Note:    Current Barriers:  Unable to independently afford treatment regimen Unable to independently monitor therapeutic efficacy  Pharmacist Clinical Goal(s):  Patient will verbalize ability to afford treatment regimen through collaboration with PharmD and provider.   Interventions: 1:1 collaboration with [name], FNP regarding development and update of comprehensive plan of care as evidenced by provider attestation and co-signature Inter-disciplinary care team collaboration (see longitudinal plan of care) Comprehensive medication review performed; medication list updated in electronic medical record  Hypertension (BP goal <130/80) -Controlled -Current treatment: Losartan 100 mg tablet once daily  -Current home readings: 126/77 -Current dietary habits: patient reports that he does not have a lot of salt diet in his diet  -Current exercise habits: walking, shooting basketball - two to three times per week  -Denies hypotensive/hypertensive symptoms -Educated on BP goals and benefits of medications for prevention of heart attack, stroke and kidney damage; -Counseled to monitor BP at home at least two times per week, document, and provide log at future appointments -Recommended to continue current medication  Diabetes (A1c goal <7%) -Uncontrolled -Current medications: Ozempic 0.25 mg once weekly on Monday  Metformin 500 mg taking 1 tablet in the morning and one tablet in the evening  -Current home glucose readings: patient is interested in CGM, patient reports that he is checking his BS once per day  fasting glucose: 85-112-139 -Denies hypoglycemic/hyperglycemic symptoms -Current meal patterns:  -Current exercise: -Current exercise habits: walking, shooting basketball - two to three times per week  -Denies hypotensive/hypertensive  symptoms -Educated on Exercise goal of 150 minutes per week; Benefits of routine self-monitoring of blood sugar; -Counseled to check feet daily and get yearly eye exams -CPA to confirm patient assistance status of Ozempic -Informed patient that they were approved for Ozempic patient assistance and that the medication would be available to pick up with in 7-10 days.  -Recommended to continue current medication  Patient Goals/Self-Care Activities Patient will:  - take medications as prescribed  Follow Up Plan: The patient has been provided with contact information for the care management team and has been advised to call with any health related questions or concerns.       Medication Assistance:  Ozempic obtained through [employer] medication assistance program.  Enrollment ends 05/2021  Compliance/Adherence/Medication fill history: Care Gaps: Shingrix Vaccine Pneumonia Vaccine  COVID-19 Booster - patient has received, will request chart to be updated based on [facility] registry   Star-Rating Drugs: Losartan 100 mg Ozempic 0.29m Metformin 500 mg Rosuvustatin 5 mg  [facility] [place]Phone: [phone]: [phone] Uses pill box? Yes Pt endorses 90% compliance  We discussed: Benefits of medication synchronization, packaging and delivery as well as enhanced pharmacist oversight with [employer]. Patient decided to: Continue current medication management strategy  Care Plan and Follow Up Patient Decision:  Patient agrees to Care Plan and Follow-up.  Plan: The patient has been provided with contact information for the care management team and has been advised to call with any health related questions or concerns.   VOrlando Penner PharmD Clinical Pharmacist [facility] [phone]

## 2020-12-24 ENCOUNTER — Other Ambulatory Visit: Payer: Self-pay | Admitting: Family Medicine

## 2020-12-26 NOTE — Patient Instructions (Signed)
Visit Information It was great speaking with you today!  Please let me know if you have any questions about our visit.   Goals Addressed             This Visit's Progress    Manage My Medicine       Timeframe:  Long-Range Goal Priority:  High Start Date:                             Expected End Date:                       Follow Up Date 03/27/2021   In Progress:   - call for medicine refill 2 or 3 days before it runs out - call if I am sick and can't take my medicine - keep a list of all the medicines I take; vitamins and herbals too - learn to read medicine labels - use a pillbox to sort medicine - use an alarm clock or phone to remind me to take my medicine    Why is this important?   These steps will help you keep on track with your medicines.           Patient Care Plan: CCM Pharmacy Care Plan     Problem Identified: HTN, CAD, DM II      Goal: Disease Management   This Visit's Progress: On track  Recent Progress: On track  Priority: High  Note:    Current Barriers:  Unable to independently afford treatment regimen Unable to independently monitor therapeutic efficacy  Pharmacist Clinical Goal(s):  Patient will verbalize ability to afford treatment regimen through collaboration with PharmD and provider.   Interventions: 1:1 collaboration with [name], FNP regarding development and update of comprehensive plan of care as evidenced by provider attestation and co-signature Inter-disciplinary care team collaboration (see longitudinal plan of care) Comprehensive medication review performed; medication list updated in electronic medical record  Hypertension (BP goal <130/80) -Controlled -Current treatment: Losartan 100 mg tablet once daily  -Current home readings: 126/77 -Current dietary habits: patient reports that he does not have a lot of salt diet in his diet  -Current exercise habits: walking, shooting basketball - two to three times per week   -Denies hypotensive/hypertensive symptoms -Educated on BP goals and benefits of medications for prevention of heart attack, stroke and kidney damage; -Counseled to monitor BP at home at least two times per week, document, and provide log at future appointments -Recommended to continue current medication  Diabetes (A1c goal <7%) -Uncontrolled -Current medications: Ozempic 0.25 mg once weekly on Monday  Metformin 500 mg taking 1 tablet in the morning and one tablet in the evening  -Current home glucose readings: patient is interested in CGM, patient reports that he is checking his BS once per day  fasting glucose: 85-112-139 -Denies hypoglycemic/hyperglycemic symptoms -Current meal patterns:  -Current exercise: -Current exercise habits: walking, shooting basketball - two to three times per week  -Denies hypotensive/hypertensive symptoms -Educated on Exercise goal of 150 minutes per week; Benefits of routine self-monitoring of blood sugar; -Counseled to check feet daily and get yearly eye exams -CPA to confirm patient assistance status of Ozempic -Informed patient that they were approved for Ozempic patient assistance and that the medication would be available to pick up with in 7-10 days.  -Recommended to continue current medication  Patient Goals/Self-Care Activities Patient will:  - take medications as prescribed  Follow Up Plan: The patient has been provided with contact information for the care management team and has been advised to call with any health related questions or concerns.       Patient agreed to services and verbal consent obtained.   The patient verbalized understanding of instructions, educational materials, and care plan provided today and agreed to receive a mailed copy of patient instructions, educational materials, and care plan.   [name], PharmD Clinical Pharmacist [facility] [phone]

## 2021-01-02 ENCOUNTER — Telehealth: Payer: Self-pay

## 2021-01-02 NOTE — Chronic Care Management (AMB) (Signed)
Chronic Care Management Pharmacy Assistant   Name: Ronald Faulkner  MRN: [id number] DOB: 1951/02/09  Reason for Encounter: Patient Assistance Coordination  01/02/2021- [place] patient assistance program to follow up on status of application for Ozempic. Spoke with Suanne Marker, per representative application was approved and shipped out today 01/02/2021. Medication will arrive to the office tomorrow 01/03/2021. Called patient to inform, no answer, left message of information and to call the office tomorrow before going to office to make sure medication was received.  [name], CPP notified.   Medications: Outpatient Encounter Medications as of 01/02/2021  Medication Sig   allopurinol (ZYLOPRIM) 300 MG tablet Take 1 tablet (300 mg total) by mouth daily.   amLODipine (NORVASC) 5 MG tablet Take 1 tablet (5 mg total) by mouth daily.   aspirin EC 81 MG tablet Take 81 mg by mouth daily.   diphenhydrAMINE (BENADRYL) 50 MG tablet Take 1 tablet (50 mg total) by mouth as needed (once per cath instructions).   ezetimibe (ZETIA) 10 MG tablet Take 1 tablet (10 mg total) by mouth daily.   fluticasone (FLONASE) 50 MCG/ACT nasal spray Place 2 sprays into both nostrils daily.   glucose blood test strip 1 each by Other route as needed for other. Use as instructed   Insulin Pen Needle (PEN NEEDLES) 31G X 6 MM MISC Use as directed with ozempic   Insulin Pen Needle 32G X 6 MM MISC Use with ozempic   isosorbide mononitrate (IMDUR) 60 MG 24 hr tablet Take 1 tablet by mouth once daily   linaclotide (LINZESS) 72 MCG capsule Take 72 mcg by mouth every other day.   losartan (COZAAR) 100 MG tablet Take 1 tablet (100 mg total) by mouth daily.   metFORMIN (GLUCOPHAGE) 500 MG tablet Take 1 tablet (500 mg total) by mouth daily with breakfast.   metoprolol succinate (TOPROL-XL) 25 MG 24 hr tablet TAKE 1 & 1/2 (ONE & ONE-HALF) TABLETS BY MOUTH ONCE DAILY   mometasone (NASONEX) 50 MCG/ACT nasal spray Place 2 sprays  into the nose daily.   nitroGLYCERIN (NITROSTAT) 0.4 MG SL tablet DISSOLVE ONE TABLET UNDER THE TONGUE EVERY 5 MINUTES AS NEEDED FOR CHEST PAIN.&nbsp;&nbsp;DO NOT EXCEED A TOTAL OF 3 DOSES IN 15 MINUTES   oxymetazoline (AFRIN) 0.05 % nasal spray Place 1 spray into both nostrils 2 (two) times daily as needed for congestion.   pantoprazole (PROTONIX) 40 MG tablet TAKE 1 TABLET BY MOUTH ONCE DAILY BEFORE BREAKFAST   rosuvastatin (CRESTOR) 5 MG tablet TAKE ONE TABLET BY MOUTH ON MONDAY, WEDNESDAY, AND FRIDAY   Semaglutide,0.25 or 0.'5MG'$ /DOS, (OZEMPIC, 0.25 OR 0.5 MG/DOSE,) 2 MG/1.5ML SOPN Inject 0.5 mg into the skin once a week.   Facility-Administered Encounter Medications as of 01/02/2021  Medication   sodium chloride flush (NS) 0.9 % injection 3 mL    Care Gaps: Zoster Vaccines- Shingrix- Overdue - never done (Dose 1 of 2) PNA vac Low Risk Adult (1 of 2 - PCV13)- Last completed: Sep 05, 2020  COVID-19 Vaccine (4 - Booster for [facility] series)- Last completed: Jun 05, 2020 INFLUENZA VACCINE- Due August 2022.   Star Rating Drugs: Ozempic 0.5 MG- Last filled 09-05-2020 84 DS [facility] (Patient assistance) Losartan 100 MG- Last filled 11-29-2020 90 DS [facility]. Rosuvastatin 5 MG- Last filled 12-24-2020 84 DS [facility] Metformin 500 MG- Last filled 11-29-2020 90 DS [facility]  Follow up visit with CCM Pharmacist [name] scheduled for 03/27/2021 at 2:00 pm   Pattricia Boss, [place] Pharmacist  Assistant [phone]

## 2021-02-05 ENCOUNTER — Other Ambulatory Visit (INDEPENDENT_AMBULATORY_CARE_PROVIDER_SITE_OTHER): Payer: Self-pay | Admitting: Gastroenterology

## 2021-02-06 ENCOUNTER — Other Ambulatory Visit (INDEPENDENT_AMBULATORY_CARE_PROVIDER_SITE_OTHER): Payer: Self-pay | Admitting: Gastroenterology

## 2021-02-13 ENCOUNTER — Telehealth: Payer: Self-pay

## 2021-02-13 NOTE — Chronic Care Management (AMB) (Signed)
Chronic Care Management Pharmacy Assistant   Name: Ronald Faulkner  MRN: [id number] DOB: 05/16/1951   Reason for Encounter: Disease State/ Hypertension  Recent office visits:  None  Recent consult visits:  None  Hospital visits:  None in previous 6 months  Medications: Outpatient Encounter Medications as of 02/13/2021  Medication Sig   allopurinol (ZYLOPRIM) 300 MG tablet Take 1 tablet (300 mg total) by mouth daily.   amLODipine (NORVASC) 5 MG tablet Take 1 tablet (5 mg total) by mouth daily.   aspirin EC 81 MG tablet Take 81 mg by mouth daily.   diphenhydrAMINE (BENADRYL) 50 MG tablet Take 1 tablet (50 mg total) by mouth as needed (once per cath instructions).   ezetimibe (ZETIA) 10 MG tablet Take 1 tablet (10 mg total) by mouth daily.   fluticasone (FLONASE) 50 MCG/ACT nasal spray Place 2 sprays into both nostrils daily.   glucose blood test strip 1 each by Other route as needed for other. Use as instructed   Insulin Pen Needle (PEN NEEDLES) 31G X 6 MM MISC Use as directed with ozempic   Insulin Pen Needle 32G X 6 MM MISC Use with ozempic   isosorbide mononitrate (IMDUR) 60 MG 24 hr tablet Take 1 tablet by mouth once daily   linaclotide (LINZESS) 72 MCG capsule Take 72 mcg by mouth every other day.   losartan (COZAAR) 100 MG tablet Take 1 tablet (100 mg total) by mouth daily.   metFORMIN (GLUCOPHAGE) 500 MG tablet Take 1 tablet (500 mg total) by mouth daily with breakfast.   metoprolol succinate (TOPROL-XL) 25 MG 24 hr tablet TAKE 1 & 1/2 (ONE & ONE-HALF) TABLETS BY MOUTH ONCE DAILY   mometasone (NASONEX) 50 MCG/ACT nasal spray Place 2 sprays into the nose daily.   nitroGLYCERIN (NITROSTAT) 0.4 MG SL tablet DISSOLVE ONE TABLET UNDER THE TONGUE EVERY 5 MINUTES AS NEEDED FOR CHEST PAIN.&nbsp;&nbsp;DO NOT EXCEED A TOTAL OF 3 DOSES IN 15 MINUTES   oxymetazoline (AFRIN) 0.05 % nasal spray Place 1 spray into both nostrils 2 (two) times daily as needed for congestion.   pantoprazole  (PROTONIX) 40 MG tablet TAKE 1 TABLET BY MOUTH ONCE DAILY BEFORE BREAKFAST   rosuvastatin (CRESTOR) 5 MG tablet TAKE ONE TABLET BY MOUTH ON MONDAY, WEDNESDAY, AND FRIDAY   Semaglutide,0.25 or 0.'5MG'$ /DOS, (OZEMPIC, 0.25 OR 0.5 MG/DOSE,) 2 MG/1.5ML SOPN Inject 0.5 mg into the skin once a week.   Facility-Administered Encounter Medications as of 02/13/2021  Medication   sodium chloride flush (NS) 0.9 % injection 3 mL   Reviewed chart prior to disease state call. Spoke with patient regarding BP  Recent Office Vitals: BP Readings from Last 3 Encounters:  11/27/20 126/76  09/21/20 130/80  09/07/20 (!) 142/80   Pulse Readings from Last 3 Encounters:  11/27/20 81  09/21/20 94  09/07/20 80    Wt Readings from Last 3 Encounters:  11/27/20 153 lb 12.8 oz (69.8 kg)  09/21/20 159 lb (72.1 kg)  09/07/20 159 lb 3.2 oz (72.2 kg)     Kidney Function Lab Results  Component Value Date/Time   CREATININE 1.09 11/27/2020 10:34 AM   CREATININE 0.80 09/11/2020 08:53 AM   GFRNONAA 81 06/05/2020 03:35 PM   GFRAA 93 06/05/2020 03:35 PM    BMP Latest Ref Rng & Units 11/27/2020 09/11/2020 06/05/2020  Glucose 65 - 99 mg/dL 107(H) 127(H) 105(H)  BUN 8 - 27 mg/dL '13 10 10  '$ Creatinine 0.76 - 1.27 mg/dL 1.09 0.80 0.95  BUN/Creat  Ratio 10 - '24 12 13 11  '$ Sodium 134 - 144 mmol/L 142 141 143  Potassium 3.5 - 5.2 mmol/L 3.5 4.2 3.7  Chloride 96 - 106 mmol/L 102 103 105  CO2 20 - 29 mmol/L 19(L) 22 23  Calcium 8.6 - 10.2 mg/dL 9.4 9.6 9.7    Current antihypertensive regimen:  Losartan 100 mg daily  How often are you checking your Blood Pressure? 3-5x per week  Current home BP readings: 151/98   What recent interventions/DTPs have been made by any provider to improve Blood Pressure control since last CPP Visit:  Educated on BP goals and benefits of medications for prevention of heart attack, stroke and kidney damage Counseled to monitor BP at home at least two times per week, document, and provide log at  future appointments  Any recent hospitalizations or ED visits since last visit with CPP? No  What diet changes have been made to improve Blood Pressure Control?  Patient states he drinks about 2 bottles of water daily and limits his salt intake.  What exercise is being done to improve your Blood Pressure Control?  Patient states he walks a lot daily  Adherence Review: Is the patient currently on ACE/ARB medication? Yes Does the patient have >5 day gap between last estimated fill dates? No   Care Gaps: Shingrix overdue PNA vac overude Covid booster overdue AWV 09-12-2021  Star Rating Drugs: Losartan 100 mg- Last filled 11-29-2020 90 DS [facility] Ozempic 0.25 mg- Patient assistance Metformin 500 mg- Last filled 01-17-2021 90 DS [facility] Rosuvastatin 5 mg- Last filled 12-24-2020 36 DS [place] Clinical Pharmacist Assistant [phone]

## 2021-02-26 ENCOUNTER — Other Ambulatory Visit: Payer: Self-pay | Admitting: Nurse Practitioner

## 2021-02-27 ENCOUNTER — Encounter: Payer: Self-pay | Admitting: Nurse Practitioner

## 2021-02-27 ENCOUNTER — Ambulatory Visit (INDEPENDENT_AMBULATORY_CARE_PROVIDER_SITE_OTHER): Payer: Medicare HMO | Admitting: Nurse Practitioner

## 2021-02-27 ENCOUNTER — Other Ambulatory Visit: Payer: Self-pay

## 2021-02-27 VITALS — BP 126/84 | HR 72 | Temp 97.8°F | Ht 65.0 in | Wt 157.0 lb

## 2021-02-27 DIAGNOSIS — E119 Type 2 diabetes mellitus without complications: Secondary | ICD-10-CM

## 2021-02-27 DIAGNOSIS — R0602 Shortness of breath: Secondary | ICD-10-CM

## 2021-02-27 DIAGNOSIS — D229 Melanocytic nevi, unspecified: Secondary | ICD-10-CM

## 2021-02-27 DIAGNOSIS — Z23 Encounter for immunization: Secondary | ICD-10-CM

## 2021-02-27 DIAGNOSIS — I10 Essential (primary) hypertension: Secondary | ICD-10-CM

## 2021-02-27 NOTE — Patient Instructions (Signed)

## 2021-02-27 NOTE — Progress Notes (Signed)
I,Ronald Faulkner,acting as a Education administrator for [employer], FNP.,have documented all relevant documentation on the behalf of Ronald Brine, FNP,as directed by  Ronald Brine, FNP while in the presence of Ronald Faulkner, [place].   This visit occurred during the SARS-CoV-2 public health emergency.  Safety protocols were in place, including screening questions prior to the visit, additional usage of staff PPE, and extensive cleaning of exam room while observing appropriate contact time as indicated for disinfecting solutions.  Subjective:     Patient ID: Ronald Faulkner , male    DOB: 28-Jan-1951 , 70 y.o.   MRN: [id number]   Chief Complaint  Patient presents with  . Hypertension    HPI  Patient presents today for diabetes f/u  Wt Readings from Last 3 Encounters: 02/27/21 : 157 lb (71.2 kg) 11/27/20 : 153 lb 12.8 oz (69.8 kg) 09/21/20 : 159 lb (72.1 kg)    Hypertension This is a chronic problem. The current episode started 1 to 4 weeks ago. The problem has been gradually worsening since onset. The problem is uncontrolled. Pertinent negatives include no headaches, peripheral edema or shortness of breath (when lifting a heavy object and walking). There are no associated agents to hypertension. Risk factors for coronary artery disease include sedentary lifestyle. Past treatments include angiotensin blockers, calcium channel blockers and direct vasodilators. The current treatment provides moderate improvement. There are no compliance problems.  There is no history of angina. There is no history of chronic renal disease.  Diabetes He presents for his follow-up diabetic visit. He has type 2 diabetes mellitus. Pertinent negatives for hypoglycemia include no dizziness or headaches. There are no diabetic associated symptoms. Risk factors for coronary artery disease include diabetes mellitus, male sex, sedentary lifestyle and hypertension. Current diabetic treatment includes oral agent (dual therapy) (Ozempic and  metformin). He is following a diabetic diet. When asked about meal planning, he reported none. He has not had a previous visit with a dietitian. He rarely participates in exercise. (Blood sugar has been from 97-120's. This morning was 113.  ) An ACE inhibitor/angiotensin II receptor blocker is being taken.    Past Medical History:  Diagnosis Date  . Arthritis   . CAD (coronary artery disease)    a. s/p DES to mid and distal LAD in 07/2017 with medical management recommended of intermediate branch  . Cancer [facility])    prostate  . CHF (congestive heart failure) ([place])   . Colon polyps   . Diabetes mellitus without complication ([place])   . Diverticulitis   . History of kidney stones   . Hypertension   . IBS (irritable bowel syndrome)   . 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 Unstable angina ([place]) 07/2017     Family History  Problem Relation Age of Onset  . Colon cancer Mother   . Coronary artery disease Maternal Grandmother   . Anesthesia problems Neg Hx   . Hypotension Neg Hx   . Malignant hyperthermia Neg Hx   . Pseudochol deficiency Neg Hx      Current Outpatient Medications:  .  allopurinol (ZYLOPRIM) 300 MG tablet, Take 1 tablet (300 mg total) by mouth daily., Disp: 90 tablet, Rfl: 1 .  amLODipine (NORVASC) 5 MG tablet, Take 1 tablet (5 mg total) by mouth daily., Disp: 30 tablet, Rfl: 6 .  aspirin EC 81 MG tablet, Take 81 mg by mouth daily., Disp: , Rfl:  .  diphenhydrAMINE (BENADRYL) 50 MG tablet, Take 1 tablet (  50 mg total) by mouth as needed (once per cath instructions)., Disp: 1 tablet, Rfl: 0 .  ezetimibe (ZETIA) 10 MG tablet, Take 1 tablet (10 mg total) by mouth daily., Disp: 30 tablet, Rfl: 6 .  fluticasone (FLONASE) 50 MCG/ACT nasal spray, Place 2 sprays into both nostrils daily., Disp: , Rfl:  .  glucose blood test strip, 1 each by Other route as needed for other. Use as instructed, Disp: , Rfl:  .  Insulin Pen Needle 32G X 6 MM MISC,  Use with ozempic, Disp: 50 each, Rfl: 3 .  isosorbide mononitrate (IMDUR) 60 MG 24 hr tablet, Take 1 tablet by mouth once daily, Disp: 90 tablet, Rfl: 3 .  linaclotide (LINZESS) 72 MCG capsule, Take 72 mcg by mouth every other day., Disp: , Rfl:  .  losartan (COZAAR) 100 MG tablet, Take 1 tablet (100 mg total) by mouth daily., Disp: 90 tablet, Rfl: 1 .  metFORMIN (GLUCOPHAGE) 500 MG tablet, Take 1 tablet (500 mg total) by mouth daily with breakfast., Disp: 90 tablet, Rfl: 1 .  metoprolol succinate (TOPROL-XL) 25 MG 24 hr tablet, TAKE 1 & 1/2 (ONE & ONE-HALF) TABLETS BY MOUTH ONCE DAILY, Disp: 135 tablet, Rfl: 2 .  nitroGLYCERIN (NITROSTAT) 0.4 MG SL tablet, DISSOLVE ONE TABLET UNDER THE TONGUE EVERY 5 MINUTES AS NEEDED FOR CHEST PAIN.&nbsp;&nbsp;DO NOT EXCEED A TOTAL OF 3 DOSES IN 15 MINUTES, Disp: 25 tablet, Rfl: 0 .  oxymetazoline (AFRIN) 0.05 % nasal spray, Place 1 spray into both nostrils 2 (two) times daily as needed for congestion., Disp: , Rfl:  .  pantoprazole (PROTONIX) 40 MG tablet, TAKE 1 TABLET BY MOUTH ONCE DAILY BEFORE BREAKFAST, Disp: 90 tablet, Rfl: 0 .  rosuvastatin (CRESTOR) 5 MG tablet, TAKE ONE TABLET BY MOUTH ON MONDAY, WEDNESDAY, AND FRIDAY, Disp: 36 tablet, Rfl: 3 .  Semaglutide,0.25 or 0.5MG/DOS, (OZEMPIC, 0.25 OR 0.5 MG/DOSE,) 2 MG/1.5ML SOPN, Inject 0.5 mg into the skin once a week., Disp: 4.5 mL, Rfl: 3   Allergies  Allergen Reactions  . Bee Venom   . Fish Allergy Hives and Swelling  . Ivp Dye [Iodinated Diagnostic Agents] Swelling     Review of Systems  Respiratory: Negative.  Negative for shortness of breath (when lifting a heavy object and walking).   Cardiovascular: Negative.   Skin:        Has mole to his left side of head.   Neurological:  Negative for dizziness and headaches.  All other systems reviewed and are negative.   Today's Vitals   02/27/21 0924  BP: 126/84  Pulse: 72  Temp: 97.8 F (36.6 C)  TempSrc: Oral  Weight: 157 lb (71.2 kg)  Height:  '5\' 5"'  (1.651 m)  PainSc: 0-No pain   Body mass index is 26.13 kg/m.  Wt Readings from Last 3 Encounters:  02/27/21 157 lb (71.2 kg)  11/27/20 153 lb 12.8 oz (69.8 kg)  09/21/20 159 lb (72.1 kg)    BP Readings from Last 3 Encounters:  02/27/21 126/84  11/27/20 126/76  09/21/20 130/80    Objective:  Physical Exam Vitals reviewed.  Constitutional:      General: He is not in acute distress.    Appearance: Normal appearance.  Cardiovascular:     Rate and Rhythm: Normal rate and regular rhythm.     Pulses: Normal pulses.     Heart sounds: Normal heart sounds. No murmur heard. Skin:    General: Skin is warm and dry.     Findings:  Lesion (irregularly shaped nevi to left posterior head) present.  Neurological:     General: No focal deficit present.     Mental Status: He is alert and oriented to person, place, and time.  Psychiatric:        Mood and Affect: Mood normal.        Behavior: Behavior normal.        Thought Content: Thought content normal.        Judgment: Judgment normal.        Assessment And Plan:     1. Type 2 diabetes mellitus without complication, without long-term current use of insulin (HCC) Comments: HgbA1c was slightly elevated at last visit Continue current medications - BMP8+EGFR - Hemoglobin A1c  2. Primary hypertension Comments: Much better control, continue follow up with Cardiology  3. SOB (shortness of breath) Comments: None currently, occurs with lifing heavy objects. Encouraged to f/u with Cardiology for possible cardiac rehab. Physical exam is normal.  4. 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 Will refer to Dermatology Encouraged to wear a hat  or cover head when in the sun for long periods - Ambulatory referral to Dermatology  5. Immunization due Influenza vaccine administered Encouraged to take Tylenol as needed for fever or muscle aches. - Flu Vaccine  QUAD 6+ mos PF IM (Fluarix Quad PF)     Patient was given opportunity to ask questions. Patient verbalized understanding of the plan and was able to repeat key elements of the plan. All questions were answered to their satisfaction.  Ronald Brine, FNP   I, Ronald Brine, FNP, have reviewed all documentation for this visit. The documentation on 02/27/21 for the exam, diagnosis, procedures, and orders are all accurate and complete.   IF YOU HAVE BEEN REFERRED TO A SPECIALIST, IT MAY TAKE 1-2 WEEKS TO SCHEDULE/PROCESS THE REFERRAL. IF YOU HAVE NOT HEARD FROM US/SPECIALIST IN TWO WEEKS, PLEASE GIVE [place] A CALL AT [phone].   THE PATIENT IS ENCOURAGED TO PRACTICE SOCIAL DISTANCING DUE TO THE COVID-19 PANDEMIC.

## 2021-02-28 LAB — HEMOGLOBIN A1C
Est. average glucose Bld gHb Est-mCnc: 143 mg/dL
Hgb A1c MFr Bld: 6.6 % — ABNORMAL HIGH (ref 4.8–5.6)

## 2021-02-28 LAB — BMP8+EGFR
BUN/Creatinine Ratio: 12 (ref 10–24)
BUN: 10 mg/dL (ref 8–27)
CO2: 21 mmol/L (ref 20–29)
Calcium: 9.6 mg/dL (ref 8.6–10.2)
Chloride: 101 mmol/L (ref 96–106)
Creatinine, Ser: 0.83 mg/dL (ref 0.76–1.27)
Glucose: 101 mg/dL — ABNORMAL HIGH (ref 70–99)
Potassium: 3.8 mmol/L (ref 3.5–5.2)
Sodium: 140 mmol/L (ref 134–144)
eGFR: 94 mL/min/{1.73_m2} (ref >59)

## 2021-03-19 ENCOUNTER — Other Ambulatory Visit: Payer: Self-pay | Admitting: Nurse Practitioner

## 2021-03-19 ENCOUNTER — Other Ambulatory Visit: Payer: Self-pay | Admitting: Family Medicine

## 2021-03-19 DIAGNOSIS — I10 Essential (primary) hypertension: Secondary | ICD-10-CM

## 2021-03-20 NOTE — Progress Notes (Signed)
Cardiology Office Note  Date: 03/21/2021   ID: [name], [name] 1951/01/03, MRN [id number]  PCP:  [name], [place]  Cardiologist:  None Electrophysiologist:  None   Chief Complaint: 37-month follow-up  History of Present Illness: Ronald Faulkner is a 70 y.o. male with a history of accelerating angina with history of CAD.  DES to mid and distal LAD 07/31/2017.  Moderately severe stenosis and small caliber intermediate branch and medical management was recommended.  Moderate stenosis in the distal RCA 50%.  Other medical history includes HTN and DM2 . Previously followed by Dr. Bronson Ing 10/26/2019.  He had been complaining the previous month of episodic chest pain lasting for approximately 5 seconds, occurring at rest, sometimes occurring every day, or every 2 or 3 days.  Having marked shortness of breath with exertion walking 20 to 30 feet from kitchen to the living room.  Short of breath when climbing a flight of stairs.  Feeling dizzy when bending over.  Used nitroglycerin on 2 separate occasions.  A cardiac catheterization was ordered.  HCTZ was stopped and losartan was reduced to 50 mg due to hypotension.  Last LDL 132 on 03/24/2018.  He did not tolerate atorvastatin 40 mg daily.  Currently taking Crestor 5 mg every Monday, Wednesday, Friday.  No longer on Zetia.  Still having some mild myalgia but not as severe as when taking Lipitor.   He wast last here for follow up.  Since starting amlodipine and increasing losartan to 100 mg daily his blood pressure has improved.  Blood pressure was 130/80.  His LDL was elevated at 80 at last visit.  He had recent lab work from PCP which showed LDL 69.  He stated he had not started taking the Zetia at the request of his PCP until he had the lab work.  He denied any anginal or exertional symptoms, orthostatic symptoms, CVA or TIA-like symptoms, palpitations or arrhythmias, orthostatic symptoms, PND, orthopnea, bleeding, claudication, DVT or PE-like  symptoms, lower extremity edema.  He stated he had had some bowel issues with some diarrhea he attributed to metformin and Ozempic.  He is here for follow-up today.  He states yesterday he had some chest pain while at work.  States it began in the afternoon.  He described it as left anterior chest pain rated it 8 out of 10.  He denies any radiation to neck, arm, back or jaw.  He denies any associated nausea, vomiting, diaphoresis.  When questioned about relationship to symptoms he had prior to previous stent intervention he states it feels somewhat similar.  He states initially he thought it may have been indigestion.  He states it lasted from yesterday afternoon to around 7 PM in the evening and resolved.  He has had no further episodes.  He states it may or may not have been related to eating.  He states he was at a social gathering and and ate some pork and coleslaw.  He states he took some Rolaids which did not seem to relieve the pain.  EKG today shows normal sinus rhythm rate of 82.  RBBB.  No significant changes from prior EKG.  Blood pressure is up today.  He states its been up for the last few days.  We discussed increasing amlodipine.  We discussed increasing Imdur for chest pain.  We discussed possible ischemic testing.   Past Medical History:  Diagnosis Date   Arthritis    CAD (coronary artery disease)    a.  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    Unstable angina ([place]) 07/2017    Past Surgical History:  Procedure Laterality Date   ABDOMINAL SURGERY     APPENDECTOMY     BREAST SURGERY     benign lump at lt side   COLON  SURGERY     COLONOSCOPY  03/14/2011   Procedure: COLONOSCOPY;  Surgeon: [name], MD;  [facility];  Service: Endoscopy;  Laterality: N/A;  1:00   COLONOSCOPY N/A 11/15/2015   Procedure: COLONOSCOPY;  Surgeon: [name], MD;  [facility];  Service: Endoscopy;  Laterality: N/A;  11:15   COLONOSCOPY WITH PROPOFOL N/A 07/15/2019   Procedure: COLONOSCOPY WITH PROPOFOL;  Surgeon: [name], MD;  [facility];  Service: Endoscopy;  Laterality: N/A;  1235   CORONARY STENT INTERVENTION N/A 07/31/2017   Procedure: CORONARY STENT INTERVENTION;  Surgeon: [name], MD;  Location: [place] [facility];  Service: Cardiovascular;  Laterality: N/A;   LEFT HEART CATH AND CORONARY ANGIOGRAPHY N/A 07/31/2017   Procedure: LEFT HEART CATH AND CORONARY ANGIOGRAPHY;  Surgeon: [name], MD;  Location: [place] [facility];  Service: Cardiovascular;  Laterality: N/A;   LEFT HEART CATH AND CORONARY ANGIOGRAPHY N/A 10/28/2019   Procedure: LEFT HEART CATH AND CORONARY ANGIOGRAPHY;  Surgeon: Belva Crome, MD;  Location: [place] [facility];  Service: Cardiovascular;  Laterality: N/A;   PENILE PROSTHESIS IMPLANT     2016    prostate cancer     2015. prostectomy    Current Outpatient Medications  Medication Sig Dispense Refill   allopurinol (ZYLOPRIM) 300 MG tablet Take 1 tablet by mouth once daily 90 tablet 0   amLODipine (NORVASC) 5 MG tablet Take 1 tablet (5 mg total) by mouth daily. 30 tablet 6   aspirin EC 81 MG tablet Take 81 mg by mouth daily.     diphenhydrAMINE (BENADRYL) 50 MG tablet Take 1 tablet (50 mg total) by mouth as needed (once per cath instructions). 1 tablet 0   ezetimibe (ZETIA) 10 MG tablet Take 10 mg by mouth daily.     fluticasone (FLONASE) 50 MCG/ACT nasal spray Place 2 sprays into both nostrils daily.     glucose blood test strip 1 each by Other route as needed for other. Use as instructed     Insulin Pen Needle 32G X 6  MM MISC Use with ozempic 50 each 3   isosorbide mononitrate (IMDUR) 60 MG 24 hr tablet Take 1 tablet by mouth once daily 90 tablet 3   linaclotide (LINZESS) 72 MCG capsule Take 72 mcg by mouth every other day.     losartan (COZAAR) 100 MG tablet Take 1 tablet by mouth once daily 90 tablet 0   metFORMIN (GLUCOPHAGE) 500 MG tablet Take 1 tablet by mouth once daily with breakfast 90 tablet 0   metoprolol succinate (TOPROL-XL) 25 MG 24 hr tablet Take 1.5 tablets (37.5 mg total) by mouth daily. TAKE 1 & 1/2 (ONE & ONE-HALF) TABLETS BY MOUTH ONCE DAILY  135 tablet 3   nitroGLYCERIN (NITROSTAT) 0.4 MG SL tablet DISSOLVE ONE TABLET UNDER THE TONGUE EVERY 5 MINUTES AS NEEDED FOR CHEST PAIN.&nbsp;&nbsp;DO NOT EXCEED A TOTAL OF 3 DOSES IN 15 MINUTES 25 tablet 0   oxymetazoline (AFRIN) 0.05 % nasal spray Place 1 spray into both nostrils 2 (two) times daily as needed for congestion.     pantoprazole (PROTONIX) 40 MG tablet TAKE 1 TABLET BY MOUTH ONCE DAILY BEFORE BREAKFAST 90 tablet 0   rosuvastatin (CRESTOR) 5 MG tablet TAKE ONE TABLET BY MOUTH ON MONDAY, WEDNESDAY, AND FRIDAY 36 tablet 3   Semaglutide,0.25 or 0.5MG /DOS, (OZEMPIC, 0.25 OR 0.5 MG/DOSE,) 2 MG/1.5ML SOPN Inject 0.5 mg into the skin once a week. 4.5 mL 3   No current facility-administered medications for this visit.   Allergies:  Bee venom, Fish allergy, and Ivp dye [iodinated diagnostic agents]   Social History: The patient  reports that he quit smoking about 21 years ago. His smoking use included cigarettes. He has a 35.00 pack-year smoking history. He has never used smokeless tobacco. He reports current alcohol use. He reports that he does not use drugs.   Family History: The patient's family history includes Colon cancer in his mother; Coronary artery disease in his maternal grandmother.   ROS:  Please see the history of present illness. Otherwise, complete review of systems is positive for none.  All other systems are reviewed and negative.    Physical Exam: VS:  BP (!) 140/98   Pulse 88   Ht 5\' 7"  (1.702 m)   Wt 158 lb 12.8 oz (72 kg)   SpO2 97%   BMI 24.87 kg/m , BMI Body mass index is 24.87 kg/m.  Wt Readings from Last 3 Encounters:  03/21/21 158 lb 12.8 oz (72 kg)  02/27/21 157 lb (71.2 kg)  11/27/20 153 lb 12.8 oz (69.8 kg)    General: Patient appears comfortable at rest. Neck: Supple, no elevated JVP or carotid bruits, no thyromegaly. Lungs: Clear to auscultation, nonlabored breathing at rest. Cardiac: Regular rate and rhythm, no S3 or significant systolic murmur, no pericardial rub. Extremities: No pitting edema, distal pulses 2+. Skin: Warm and dry. Musculoskeletal: No kyphosis. Neuropsychiatric: Alert and oriented x3, affect grossly appropriate.  ECG: EKG today 03/21/2021 normal sinus rhythm right bundle branch block rate of 82  Recent Labwork: 11/27/2020: ALT 18; AST 19; Hemoglobin 13.1; Platelets 219 02/27/2021: BUN 10; Creatinine, Ser 0.83; Potassium 3.8; Sodium 140     Component Value Date/Time   CHOL 141 09/11/2020 0853   TRIG 134 09/11/2020 0853   HDL 48 09/11/2020 0853   CHOLHDL 2.9 09/11/2020 0853   CHOLHDL 4.3 05/27/2017 0844   VLDL 51 (H) 05/27/2017 0844   LDLCALC 69 09/11/2020 0853    Other Studies Reviewed Today:   Echocardiogram 07/31/2020  1. Left ventricular ejection fraction, by estimation, is 60 to 65%. The left ventricle has normal function. The left ventricle has no regional wall motion abnormalities. Left ventricular diastolic parameters are consistent with Grade I diastolic dysfunction (impaired relaxation). 2. Right ventricular systolic function is normal. The right ventricular size is normal. Tricuspid regurgitation signal is inadequate for assessing PA pressure. 3. The mitral valve is grossly normal. Trivial mitral valve regurgitation. 4. The aortic valve is tricuspid. There is mild calcification of the aortic valve. Aortic valve regurgitation is not visualized. 5. The  inferior vena cava is normal in size with greater than 50% respiratory variability, suggesting right atrial pressure of 3 mmHg. Comparison(s): Echocardiogram  done 10/13/18 showed an EF of 60-65%.   Cardiac cath 10/28/2019  Patent left main Patent LAD mid and distal stent. High-grade diffuse obstructive disease in a branching relatively small ramus intermedius.  Slight progression compared to 2019. Widely patent circumflex with first obtuse marginal containing a 50% proximal narrowing Widely patent dominant right coronary with 30 to 40% narrowing distally. Normal LV function with EF 55%.  LVEDP 20 mmHg.   RECOMMENDATIONS:   Continue current therapy Further medication adjustments and management per Dr.,[place] Dominance: Co-dominant  Intervention    Echocardiogram 10/13/2018 IMPRESSIONS   1. The left ventricle has normal systolic function with an ejection  fraction of 60-65%. The cavity size was normal. Left ventricular diastolic  Doppler parameters are consistent with impaired relaxation. No evidence of  left ventricular regional wall  motion abnormalities.   2. The right ventricle has normal systolic function. The cavity was  normal. There is no increase in right ventricular wall thickness.   3. The aortic valve is tricuspid. Mild aortic annular calcification  noted.   4. The mitral valve is grossly normal. There is mild mitral annular  calcification present.   5. The tricuspid valve is grossly normal.   6. The aortic root is normal in size and structure   Assessment and Plan:   1. CAD in native artery/chest pain Status post cardiac catheterization 10/28/2019: Patent LAD mid and distal stent.  High-grade diffuse obstructive disease and branching relatively small RI, slight progression compared to 2019.  Widely patent circumflex  first obtuse marginal containing 50% proximal narrowing, widely patent dominant RCA with 30 to 40% narrowing.  EF 55%.    He states recently he  had an episode of chest pain as described above in HPI.  He states pain felt somewhat similar to symptoms he had the prior interventions for CAD.  He took Rolaids which did not relieve the pain.  He denied taking any sublingual nitroglycerin.   Continue aspirin 81 mg daily, increase Imdur to 90 mg p.o. daily Toprol-XL 25 mg daily, nitroglycerin sublingual as needed.  Please get a Lexiscan stress test.  2. Essential hypertension At last visit we increased losartan to 50 mg a.m. and 50 mg p.m. continue Toprol-XL 25 mg daily.  At last visit we added amlodipine 5 mg daily p.o. states his blood pressure has been elevated over the last week or so in the 818E to 993Z systolic over 16R systolic.  We will increase amlodipine to 10 mg daily.   3. Hyperlipidemia LDL goal <70 Recent lipid panel at PCP office on 06/05/2020: TC 151, TG 99, HDL 53, LDL 80.  Continue Crestor 5 mg on Mondays, Wednesdays, and Fridays.  Recent labs on 09/11/2020 by PCP showed improvement in LDL to 69.  Go ahead and start Zetia 10 mg daily.  4.  DOE/shortness of breath Currently denies any DOE or shortness of breath.  Recent echo 07/31/2020 demonstrated EF 60 to 65%.  No WMA's.  G1 DD.  Trivial MR.  Medication Adjustments/Labs and Tests Ordered: Current medicines are reviewed at length with the patient today.  Concerns regarding medicines are outlined above.   Disposition: Follow-up with Dr. Harl Bowie or APP 1 month  Signed, Levell July, NP 03/21/2021 9:19 AM    [facility], [place], [place] [address] Phone: [phone]; Fax: [phone]

## 2021-03-21 ENCOUNTER — Ambulatory Visit: Payer: Medicare HMO | Admitting: Family Medicine

## 2021-03-21 ENCOUNTER — Encounter: Payer: Self-pay | Admitting: Family Medicine

## 2021-03-21 ENCOUNTER — Encounter: Payer: Self-pay | Admitting: *Deleted

## 2021-03-21 VITALS — BP 140/98 | HR 88 | Ht 67.0 in | Wt 158.8 lb

## 2021-03-21 DIAGNOSIS — I2510 Atherosclerotic heart disease of native coronary artery without angina pectoris: Secondary | ICD-10-CM | POA: Diagnosis not present

## 2021-03-21 DIAGNOSIS — I10 Essential (primary) hypertension: Secondary | ICD-10-CM | POA: Diagnosis not present

## 2021-03-21 DIAGNOSIS — E785 Hyperlipidemia, unspecified: Secondary | ICD-10-CM

## 2021-03-21 DIAGNOSIS — R0602 Shortness of breath: Secondary | ICD-10-CM

## 2021-03-21 DIAGNOSIS — R079 Chest pain, unspecified: Secondary | ICD-10-CM

## 2021-03-21 MED ORDER — ISOSORBIDE MONONITRATE ER 60 MG PO TB24: 60.0000 mg | ORAL_TABLET | Freq: Every day | ORAL | 1 refills | Status: DC

## 2021-03-21 MED ORDER — AMLODIPINE BESYLATE 10 MG PO TABS: 10.0000 mg | ORAL_TABLET | Freq: Every day | ORAL | 1 refills | Status: DC

## 2021-03-21 NOTE — Patient Instructions (Signed)
Medication Instructions:  Your physician has recommended you make the following change in your medication:  Increase amlodipine to 10 mg daily Increase isosorbide mononitrate to 90 mg daily (1&1/2 of your 60 mg tablet) Continue other medications the same  Labwork: none  Testing/Procedures: Your physician has requested that you have a lexiscan myoview. For further information please visit [email or web address]. Please follow instruction sheet, as given.  Follow-Up: Your physician recommends that you schedule a follow-up appointment in: 1 month  Any Other Special Instructions Will Be Listed Below (If Applicable).  If you need a refill on your cardiac medications before your next appointment, please call your pharmacy.

## 2021-03-26 ENCOUNTER — Telehealth: Payer: Self-pay

## 2021-03-26 NOTE — Chronic Care Management (AMB) (Signed)
    Called Ronald Faulkner, No answer, left message of appointment on 03-26-2021 at 2:00 via telephone visit with [name], Pharm D. Notified to have all medications, supplements, blood pressure and/or blood sugar logs available during appointment and to return call if need to reschedule.    Care Gaps: Shingrix overdue Covid booster overdue last completed 06-05-2020 AWV 09-12-2021  Star Rating Drug: Losartan 100 mg- Last filled 03-20-2021 90 DS [facility] Ozempic 0.25 mg- Patient assistance Metformin 500 mg- Last filled 01-17-2021 90 DS [facility] Rosuvastatin 5 mg- Last filled 03-15-2021 84 DS [facility]  Any gaps in medications fill history? No  [place] Pharmacist Assistant [phone]

## 2021-03-27 ENCOUNTER — Ambulatory Visit (INDEPENDENT_AMBULATORY_CARE_PROVIDER_SITE_OTHER): Payer: Medicare HMO

## 2021-03-27 DIAGNOSIS — E119 Type 2 diabetes mellitus without complications: Secondary | ICD-10-CM

## 2021-03-27 DIAGNOSIS — I10 Essential (primary) hypertension: Secondary | ICD-10-CM

## 2021-03-27 NOTE — Progress Notes (Signed)
Chronic Care Management Pharmacy Note  03/28/2021 Name:  Ronald Faulkner MRN:  [id number] DOB:  10-16-1950  Summary: Patient to continue current medication regimen.   Recommendations/Changes made from today's visit: Recommended patient contact cardiologist team to inform them of caffeine intake at 8:30 am.   Plan: Patient reports stress test at 10:30 am recommended patient follow up with cardiologist appointment to confirm testing.    Subjective: Ronald Faulkner is an 70 y.o. year old male who is a primary patient of [name], [place].  The CCM team was consulted for assistance with disease management and care coordination needs.    Engaged with patient by telephone for follow up visit in response to provider referral for pharmacy case management and/or care coordination services.   Consent to Services:  The patient was given information about Chronic Care Management services, agreed to services, and gave verbal consent prior to initiation of services.  Please see initial visit note for detailed documentation.   Patient Care Team: [name], FNP as PCP - General (General Practice) Mayford Knife, [facility] (Pharmacist)  Recent office visits: 02/27/2021 PCP OV   Recent consult visits: 03/21/2021 Cardiology [facility] visits: None in previous 6 months   Objective:  Lab Results  Component Value Date   CREATININE 0.83 02/27/2021   BUN 10 02/27/2021   GFRNONAA 81 06/05/2020   GFRAA 93 06/05/2020   NA 140 02/27/2021   K 3.8 02/27/2021   CALCIUM 9.6 02/27/2021   CO2 21 02/27/2021   GLUCOSE 101 (H) 02/27/2021    Lab Results  Component Value Date/Time   HGBA1C 6.6 (H) 02/27/2021 11:34 AM   HGBA1C 6.7 (H) 11/27/2020 10:34 AM   MICROALBUR 150 09/05/2020 03:51 PM    Last diabetic Eye exam:  Lab Results  Component Value Date/Time   HMDIABEYEEXA No Retinopathy 08/22/2020 12:00 AM    Last diabetic Foot exam: No results found for: HMDIABFOOTEX   Lab Results   Component Value Date   CHOL 141 09/11/2020   HDL 48 09/11/2020   LDLCALC 69 09/11/2020   TRIG 134 09/11/2020   CHOLHDL 2.9 09/11/2020    Hepatic Function Latest Ref Rng & Units 11/27/2020 09/11/2020 06/05/2020  Total Protein 6.0 - 8.5 g/dL 7.1 7.0 7.3  Albumin 3.8 - 4.8 g/dL 4.3 4.1 4.3  AST 0 - 40 IU/L '19 10 15  ' ALT 0 - 44 IU/L '18 10 11  ' Alk Phosphatase 44 - 121 IU/L 158(H) 150(H) 161(H)  Total Bilirubin 0.0 - 1.2 mg/dL 0.2 0.2 0.3    No results found for: TSH, FREET4  CBC Latest Ref Rng & Units 11/27/2020 06/05/2020 10/27/2019  WBC 3.4 - 10.8 x10E3/uL 4.1 6.7 6.3  Hemoglobin 13.0 - 17.7 g/dL 13.1 13.4 12.7(L)  Hematocrit 37.5 - 51.0 % 40.3 41.1 40.6  Platelets 150 - 450 x10E3/uL 219 260 252    No results found for: VD25OH  Clinical ASCVD: Yes  The 10-year ASCVD risk score (Arnett DK, et al., 2019) is: 35.2%   Values used to calculate the score:     Age: 70 years     Sex: Male     Is Non-Hispanic African American: Yes     Diabetic: Yes     Tobacco smoker: No     Systolic Blood Pressure: 893 mmHg     Is BP treated: Yes     HDL Cholesterol: 48 mg/dL     Total Cholesterol: 141 mg/dL    Depression screen [facility] 2/9  09/05/2020 06/05/2020  Decreased Interest 0 0  Down, Depressed, Hopeless 0 0  PHQ - 2 Score 0 0      Social History   Tobacco Use  Smoking Status Former   Packs/day: 1.00   Years: 35.00   Pack years: 35.00   Types: Cigarettes   Quit date: 06/03/1999   Years since quitting: 21.8  Smokeless Tobacco Never   BP Readings from Last 3 Encounters:  03/21/21 (!) 140/98  02/27/21 126/84  11/27/20 126/76   Pulse Readings from Last 3 Encounters:  03/21/21 88  02/27/21 72  11/27/20 81   Wt Readings from Last 3 Encounters:  03/21/21 158 lb 12.8 oz (72 kg)  02/27/21 157 lb (71.2 kg)  11/27/20 153 lb 12.8 oz (69.8 kg)   BMI Readings from Last 3 Encounters:  03/21/21 24.87 kg/m  02/27/21 26.13 kg/m  11/27/20 25.59 kg/m    Assessment/Interventions: Review of  patient past medical history, allergies, medications, health status, including review of consultants reports, laboratory and other test data, was performed as part of comprehensive evaluation and provision of chronic care management services.   SDOH:  (Social Determinants of Health) assessments and interventions performed: No  SDOH Screenings   Alcohol Screen: Not on file  Depression (PHQ2-9): Low Risk    PHQ-2 Score: 0  Financial Resource Strain: High Risk   Difficulty of Paying Living Expenses: Very hard  Food Insecurity: No Food Insecurity   Worried About Charity fundraiser in the Last Year: Never true   Ran Out of Food in the Last Year: Never true  Housing: Not on file  Physical Activity: Inactive   Days of Exercise per Week: 0 days   Minutes of Exercise per Session: 0 min  Social Connections: Not on file  Stress: No Stress Concern Present   Feeling of Stress : Not at all  Tobacco Use: Medium Risk   Smoking Tobacco Use: Former   Smokeless Tobacco Use: Never   Passive Exposure: Not on file  Transportation Needs: No Transportation Needs   Lack of Transportation (Medical): No   Lack of Transportation (Non-Medical): No    CCM Care Plan  Allergies  Allergen Reactions   Bee Venom    Fish Allergy Hives and Swelling   Ivp Dye [Iodinated Diagnostic Agents] Swelling    Medications Reviewed Today     Reviewed by Mayford Knife, RPH (Pharmacist) on 03/27/21 at 1420  Med List Status: <None>   Medication Order Taking? Sig Documenting Provider Last Dose Status Informant  allopurinol (ZYLOPRIM) 300 MG tablet [id number] No Take 1 tablet by mouth once daily [name], FNP Taking Active   amLODipine (NORVASC) 10 MG tablet [id number]  Take 1 tablet (10 mg total) by mouth daily. Verta Ellen., NP  Active   aspirin EC 81 MG tablet [id number] No Take 81 mg by mouth daily. [provider] Taking Active Self  diphenhydrAMINE (BENADRYL) 50 MG tablet [id number] No Take 1  tablet (50 mg total) by mouth as needed (once per cath instructions). Herminio Commons, MD Taking Active Self  ezetimibe (ZETIA) 10 MG tablet [id number] No Take 10 mg by mouth daily. [provider] Taking Active   fluticasone (FLONASE) 50 MCG/ACT nasal spray [id number] No Place 2 sprays into both nostrils daily. [provider] Taking Active   glucose blood test strip [id number] No 1 each by Other route as needed for other. Use as instructed [provider] Taking Active   Insulin Pen Needle  32G X 6 MM MISC [id number] No Use with ozempic [name], FNP Taking Active   isosorbide mononitrate (IMDUR) 60 MG 24 hr tablet [id number]  Take 1 tablet (60 mg total) by mouth daily. Verta Ellen., NP  Active   linaclotide [facility]) 72 MCG capsule [id number] No Take 72 mcg by mouth every other day. [provider] Taking Active Self  losartan (COZAAR) 100 MG tablet [id number] No Take 1 tablet by mouth once daily [name], FNP Taking Active   metFORMIN (GLUCOPHAGE) 500 MG tablet [id number] No Take 1 tablet by mouth once daily with breakfast [name], FNP Taking Active   metoprolol succinate (TOPROL-XL) 25 MG 24 hr tablet [id number] No Take 1.5 tablets (37.5 mg total) by mouth daily. TAKE 1 & 1/2 (ONE & ONE-HALF) TABLETS BY MOUTH ONCE DAILY Verta Ellen., NP Taking Active   nitroGLYCERIN (NITROSTAT) 0.4 MG SL tablet [id number] No DISSOLVE ONE TABLET UNDER THE TONGUE EVERY 5 MINUTES AS NEEDED FOR CHEST PAIN.&nbsp;&nbsp;DO NOT EXCEED A TOTAL OF 3 DOSES IN 15 MINUTES [name], FNP Taking Active   oxymetazoline (AFRIN) 0.05 % nasal spray [id number] No Place 1 spray into both nostrils 2 (two) times daily as needed for congestion. [provider] Taking Active            Med Note Trinidad Curet, Mollie [place] Jul 29, 2017  6:19 PM)    pantoprazole (PROTONIX) 40 MG tablet [id number] No TAKE 1 TABLET BY MOUTH ONCE DAILY BEFORE BREAKFAST Rehman, Mechele Dawley, MD Taking  Active   rosuvastatin (CRESTOR) 5 MG tablet [id number] No TAKE ONE TABLET BY MOUTH ON MONDAY, WEDNESDAY, AND FRIDAY Verta Ellen., NP Taking Active   Semaglutide,0.25 or 0.5MG/DOS, (OZEMPIC, 0.25 OR 0.5 MG/DOSE,) 2 MG/1.5ML SOPN [id number] No Inject 0.5 mg into the skin once a week. [name], FNP Taking Active             Patient Active Problem List   Diagnosis Date Noted   Coronary artery disease involving native coronary artery of native heart with angina pectoris ([place]) 06/05/2020   Nausea without vomiting 10/25/2019   Chronic constipation 07/07/2019   History of colonic polyps 07/07/2019   Unstable angina ([place]) 07/29/2017   Type 2 diabetes mellitus ([place]) 05/27/2017   Bilateral lower extremity edema    SOB (shortness of breath)    Family hx of colon cancer 09/18/2015   Gout 09/18/2015   Rectal bleeding 04/05/2012   GERD (gastroesophageal reflux disease) 04/05/2012   Atypical chest pain 12/08/2011   Abnormal EKG 12/08/2011   HTN (hypertension) 12/08/2011    Immunization History  Administered Date(s) Administered   Fluad Quad(high Dose 65+) 02/27/2021   Influenza, High Dose Seasonal PF 05/27/2017   Influenza-Unspecified 03/14/2020   Moderna Sars-Covid-2 Vaccination 07/28/2019, 08/26/2019, 06/05/2020   PNEUMOCOCCAL CONJUGATE-20 09/05/2020   Tdap 09/05/2020    Conditions to be addressed/monitored:  Hypertension and Hyperlipidemia  Care Plan : [place]  Updates made by Mayford Knife, [place] since 03/28/2021 12:00 AM     Problem: HTN,  DM II      Goal: Disease Management   Recent Progress: On track  Priority: High  Note:   Current Barriers:  Unable to independently monitor therapeutic efficacy  Pharmacist Clinical Goal(s):  Patient will achieve adherence to monitoring guidelines and medication adherence to achieve therapeutic efficacy through collaboration with PharmD and provider.   Interventions: 1:1 collaboration with [name],  FNP regarding development and  update of comprehensive plan of care as evidenced by provider attestation and co-signature Inter-disciplinary care team collaboration (see longitudinal plan of care) Comprehensive medication review performed; medication list updated in electronic medical record  Hypertension (BP goal <130/80) -Uncontrolled -Current treatment: Losartan 100 mg tablet once per day  Metoprolol Succinate 25 mg tablet once per day Isosorbide 60 mg tablet once per day Amlodipine 10 mg tablet once per day -Medications previously tried: none noted   -Current home readings: 154/98, 140/98 -Current dietary habits: not currently using any salt -Denies hypotensive/hypertensive symptoms -Educated on BP goals and benefits of medications for prevention of heart attack, stroke and kidney damage; Daily salt intake goal < 2300 mg; Proper BP monitoring technique; -Patient to have stress test done at [facility]   -Patient reports that he drank a cup of   coffee this morning around 8:30 pm, he received a handout prior to his stress test but has not read the information yet.  -Counseled to monitor BP at home at least 5 days per week, document, and provide log at future appointments -Recommended to continue current medication Counseled on the importance of checking BP on a schedule  Diabetes (A1c goal <7%) -Controlled -Current medications: Ozempic 0.5 mg inject once per week  -Current home glucose readings: none reported during this visit  -Denies hypoglycemic/hyperglycemic symptoms -Current meal patterns: patient reports eating the same as he has in the past -Educated on A1c and blood sugar goals; -Counseled to check feet daily and get yearly eye exams -Recommended to continue current medication  Patient Goals/Self-Care Activities Patient will:  - take medications as prescribed  Follow Up Plan: The patient has been provided with contact information for the care management team  and has been advised to call with any health related questions or concerns.       Medication Assistance:  Ozempic obtained through [employer]  medication assistance program.  Enrollment ends 05/2021.   Compliance/Adherence/Medication fill history: Care Gaps: Shingrix Vaccine  COVID-19 Booster  Star-Rating Drugs: Losartan 100 mg  Ozempic 0.5 mg injection   Patient's preferred pharmacy is:  [facility] [address] [place] [address] Phone: [phone] Fax: [phone]  Uses pill box? Yes Pt endorses 95% compliance  We discussed: Benefits of medication synchronization, packaging and delivery as well as enhanced pharmacist oversight with [employer]. Patient decided to: Continue current medication management strategy  Care Plan and Follow Up Patient Decision:  Patient agrees to Care Plan and Follow-up.  Plan: The patient has been provided with contact information for the care management team and has been advised to call with any health related questions or concerns.   [name], PharmD Clinical Pharmacist [facility] [phone]

## 2021-03-28 ENCOUNTER — Ambulatory Visit (HOSPITAL_COMMUNITY)
Admission: RE | Admit: 2021-03-28 | Discharge: 2021-03-28 | Disposition: A | Discharge status: Home / Self Care | Payer: Medicare HMO | Source: Ambulatory Visit | Attending: Family Medicine | Admitting: Family Medicine

## 2021-03-28 ENCOUNTER — Encounter (HOSPITAL_COMMUNITY): Payer: Self-pay

## 2021-03-28 ENCOUNTER — Other Ambulatory Visit: Payer: Self-pay

## 2021-03-28 ENCOUNTER — Encounter (HOSPITAL_COMMUNITY)
Admission: RE | Admit: 2021-03-28 | Discharge: 2021-03-28 | Disposition: A | Discharge status: Home / Self Care | Payer: Medicare HMO | Source: Ambulatory Visit | Attending: Family Medicine | Admitting: Family Medicine

## 2021-03-28 DIAGNOSIS — R079 Chest pain, unspecified: Secondary | ICD-10-CM | POA: Diagnosis present

## 2021-03-28 LAB — NM MYOCAR MULTI W/SPECT W/WALL MOTION / EF
LV dias vol: 71 mL (ref 62–150)
LV sys vol: 24 mL
Nuc Stress EF: 67 %
Peak HR: 103 {beats}/min
RATE: 0.3
Rest HR: 69 {beats}/min
Rest Nuclear Isotope Dose: 10 mCi
SDS: 2
SRS: 5
SSS: 7
ST Depression (mm): 0 mm
Stress Nuclear Isotope Dose: 31 mCi
TID: 0.93

## 2021-03-28 MED ORDER — REGADENOSON 0.4 MG/5ML IV SOLN
INTRAVENOUS | Status: AC
  Administered 2021-03-28: 0.4 mg via INTRAVENOUS
  Filled 2021-03-28: qty 5

## 2021-03-28 MED ORDER — TECHNETIUM TC 99M TETROFOSMIN IV KIT
10.0000 | PACK | Freq: Once | INTRAVENOUS | Status: AC | PRN
  Administered 2021-03-28: 10 via INTRAVENOUS

## 2021-03-28 MED ORDER — SODIUM CHLORIDE FLUSH 0.9 % IV SOLN
INTRAVENOUS | Status: AC
  Administered 2021-03-28: 10 mL via INTRAVENOUS
  Filled 2021-03-28: qty 10

## 2021-03-28 MED ORDER — TECHNETIUM TC 99M TETROFOSMIN IV KIT
30.0000 | PACK | Freq: Once | INTRAVENOUS | Status: AC | PRN
  Administered 2021-03-28: 31 via INTRAVENOUS

## 2021-03-28 NOTE — Patient Instructions (Signed)
Visit Information It was great speaking with you today!  Please let me know if you have any questions about our visit.   Goals Addressed             This Visit's Progress    Manage My Medicine       Timeframe:  Long-Range Goal Priority:  High Start Date:                             Expected End Date:                       Follow Up Date 05/16/2021  In Progress:   - call for medicine refill 2 or 3 days before it runs out - call if I am sick and can't take my medicine - keep a list of all the medicines I take; vitamins and herbals too - learn to read medicine labels - use a pillbox to sort medicine - use an alarm clock or phone to remind me to take my medicine    Why is this important?   These steps will help you keep on track with your medicines.           Patient Care Plan: CCM Pharmacy Care Plan     Problem Identified: HTN,  DM II      Goal: Disease Management   Recent Progress: On track  Priority: High  Note:   Current Barriers:  Unable to independently monitor therapeutic efficacy  Pharmacist Clinical Goal(s):  Patient will achieve adherence to monitoring guidelines and medication adherence to achieve therapeutic efficacy through collaboration with PharmD and provider.   Interventions: 1:1 collaboration with [name], FNP regarding development and update of comprehensive plan of care as evidenced by provider attestation and co-signature Inter-disciplinary care team collaboration (see longitudinal plan of care) Comprehensive medication review performed; medication list updated in electronic medical record  Hypertension (BP goal <130/80) -Uncontrolled -Current treatment: Losartan 100 mg tablet once per day  Metoprolol Succinate 25 mg tablet once per day Isosorbide 60 mg tablet once per day Amlodipine 10 mg tablet once per day -Medications previously tried: none noted   -Current home readings: 154/98, 140/98 -Current dietary habits: not currently  using any salt -Denies hypotensive/hypertensive symptoms -Educated on BP goals and benefits of medications for prevention of heart attack, stroke and kidney damage; Daily salt intake goal < 2300 mg; Proper BP monitoring technique; -Patient to have stress test done at [facility]   -Patient reports that he drank a cup of   coffee this morning around 8:30 pm, he received a handout prior to his stress test but has not read the information yet.  -Counseled to monitor BP at home at least 5 days per week, document, and provide log at future appointments -Recommended to continue current medication Counseled on the importance of checking BP on a schedule  Diabetes (A1c goal <7%) -Controlled -Current medications: Ozempic 0.5 mg inject once per week  -Current home glucose readings: none reported during this visit  -Denies hypoglycemic/hyperglycemic symptoms -Current meal patterns: patient reports eating the same as he has in the past -Educated on A1c and blood sugar goals; -Counseled to check feet daily and get yearly eye exams -Recommended to continue current medication  Patient Goals/Self-Care Activities Patient will:  - take medications as prescribed  Follow Up Plan: The patient has been provided with contact information for the care management team and  has been advised to call with any health related questions or concerns.       Patient agreed to services and verbal consent obtained.   The patient verbalized understanding of instructions, educational materials, and care plan provided today and agreed to receive a mailed copy of patient instructions, educational materials, and care plan.   [name], PharmD Clinical Pharmacist [facility] [phone]

## 2021-03-29 ENCOUNTER — Telehealth: Payer: Self-pay | Admitting: *Deleted

## 2021-03-29 NOTE — Telephone Encounter (Signed)
Patient informed. Copy sent to PCP °

## 2021-03-29 NOTE — Telephone Encounter (Signed)
-----   Message from Merlene Laughter, RN sent at 03/29/2021  7:20 AM EDT -----  ----- Message ----- From: Verta Ellen., NP Sent: 03/28/2021  10:45 PM EDT To: Laurine Blazer, LPN  Please call the patient and let him know the stress test did not show any evidence of previous heart attack or current decreased blood flow it was determined to be normal per ED physician who interpreted the test.  Pumping function looks good Verta Ellen, NP  03/28/2021 10:44 PM

## 2021-04-01 DIAGNOSIS — I10 Essential (primary) hypertension: Secondary | ICD-10-CM

## 2021-04-01 DIAGNOSIS — E119 Type 2 diabetes mellitus without complications: Secondary | ICD-10-CM

## 2021-04-17 NOTE — Progress Notes (Signed)
Cardiology Office Note  Date: 04/18/2021   ID: Ronald Faulkner, DOB [birth year], MRN [id number]  PCP:  [name], [place]  Cardiologist:  None Electrophysiologist:  None   Chief Complaint: 17-month follow-up  History of Present Illness: Ronald Faulkner is a 71 y.o. male with a history of accelerating angina with history of CAD.  DES to mid and distal LAD 07/31/2017.  Moderately severe stenosis and small caliber intermediate branch and medical management was recommended.  Moderate stenosis in the distal RCA 50%.  Other medical history includes HTN and DM2 . Previously followed by Dr. Bronson Ing 10/26/2019.  He had been complaining the previous month of episodic chest pain lasting for approximately 5 seconds, occurring at rest, sometimes occurring every day, or every 2 or 3 days.  Having marked shortness of breath with exertion walking 20 to 30 feet from kitchen to the living room.  Short of breath when climbing a flight of stairs.  Feeling dizzy when bending over.  Used nitroglycerin on 2 separate occasions.  A cardiac catheterization was ordered.  HCTZ was stopped and losartan was reduced to 50 mg due to hypotension.  Last LDL 132 on 03/24/2018.  He did not tolerate atorvastatin 40 mg daily.  Currently taking Crestor 5 mg every Monday, Wednesday, Friday.  No longer on Zetia.  Still having some mild myalgia but not as severe as when taking Lipitor.    He was last here for follow-up.  The prior day he had some chest pain while at work.  Stated it began in the afternoon.  He described it as left anterior chest pain rated it 8 out of 10.  He denied any radiation to neck, arm, back or jaw.  He denied any associated nausea, vomiting, diaphoresis.  When questioned about relationship to symptoms he had prior to previous stent intervention he states it feels somewhat similar.  He stated initially he thought it may have been indigestion.  He states it lasted from the afternoon to around 7 PM in the evening and  resolved.  He had no further episodes.  He stated it may or may not have been related to eating.  He had been at a social gathering and and ate some pork and coleslaw.  He took some Rolaids which did not seem to relieve the pain.  EKG at visit showed normal sinus rhythm rate of 82.  RBBB.  There were no significant changes from prior EKG.  Blood pressure was up.  It had been elevated for the prior few days.  We discussed increasing amlodipine.  We discussed increasing Imdur for chest pain.  We discussed possible ischemic testing.  He is here for follow-up today after recent stress for complaints of some chest discomfort.  Stress test was considered low risk per interpreting physician.  He states he has had no more episodes of chest pain.  He states it could be related to issues with constipation.  He states he has a fairly significant problem with constipation for which he takes Linzess.  He denies any current anginal or exertional symptoms, orthostatic symptoms, CVA or TIA-like symptoms.  He denies any DOE or SOB, denies any PND, orthopnea, palpitations or arrhythmias.  No bleeding issues.  No claudication-like symptoms, DVT or PE-like symptoms.  No lower extremity edema.  Blood pressure is elevated today but he states blood pressures at home are much better, usually less than 130/80 per his statement.   Past Medical History:  Diagnosis Date   Arthritis  CAD (coronary artery disease)    a. 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    Unstable angina ([place]) 07/2017    Past Surgical History:  Procedure Laterality Date   ABDOMINAL SURGERY      APPENDECTOMY     BREAST SURGERY     benign lump at lt side   COLON SURGERY     COLONOSCOPY  03/14/2011   Procedure: COLONOSCOPY;  Surgeon: [name], MD;  [facility];  Service: Endoscopy;  Laterality: N/A;  1:00   COLONOSCOPY N/A 11/15/2015   Procedure: COLONOSCOPY;  Surgeon: [name], MD;  [facility];  Service: Endoscopy;  Laterality: N/A;  11:15   COLONOSCOPY WITH PROPOFOL N/A 07/15/2019   Procedure: COLONOSCOPY WITH PROPOFOL;  Surgeon: [name], MD;  [facility];  Service: Endoscopy;  Laterality: N/A;  1235   CORONARY STENT INTERVENTION N/A 07/31/2017   Procedure: CORONARY STENT INTERVENTION;  Surgeon: [name], MD;  Location: [place] [facility] LAB;  Service: Cardiovascular;  Laterality: N/A;   LEFT HEART CATH AND CORONARY ANGIOGRAPHY N/A 07/31/2017   Procedure: LEFT HEART CATH AND CORONARY ANGIOGRAPHY;  Surgeon: [name], MD;  Location: [place] [facility];  Service: Cardiovascular;  Laterality: N/A;   LEFT HEART CATH AND CORONARY ANGIOGRAPHY N/A 10/28/2019   Procedure: LEFT HEART CATH AND CORONARY ANGIOGRAPHY;  Surgeon: Belva Crome, MD;  Location: [place] [facility];  Service: Cardiovascular;  Laterality: N/A;   PENILE PROSTHESIS IMPLANT     2016    prostate cancer     2015. prostectomy    Current Outpatient Medications  Medication Sig Dispense Refill   allopurinol (ZYLOPRIM) 300 MG tablet Take 1 tablet by mouth once daily 90 tablet 0   amLODipine (NORVASC) 10 MG tablet Take 1 tablet (10 mg total) by mouth daily. 90 tablet 1   aspirin EC 81 MG tablet Take 81 mg by mouth daily.     diphenhydrAMINE (BENADRYL) 50 MG tablet Take 1 tablet (50 mg total) by mouth as needed (once per cath instructions). 1 tablet 0   ezetimibe (ZETIA) 10 MG tablet Take 10 mg by mouth daily.     fluticasone (FLONASE) 50 MCG/ACT nasal spray Place 2 sprays into both nostrils daily.     glucose blood test strip 1 each by Other  route as needed for other. Use as instructed     Insulin Pen Needle 32G X 6 MM MISC Use with ozempic 50 each 3   isosorbide mononitrate (IMDUR) 60 MG 24 hr tablet Take 1 tablet (60 mg total) by mouth daily. 90 tablet 1   linaclotide (LINZESS) 72 MCG capsule Take 72 mcg by mouth every other day.     losartan (COZAAR) 100 MG tablet Take 1 tablet by mouth once daily 90 tablet 0   metFORMIN (GLUCOPHAGE) 500 MG tablet Take 1 tablet by mouth once daily with breakfast 90 tablet 0   metoprolol succinate (TOPROL-XL) 25 MG 24 hr tablet Take 1.5 tablets (37.5 mg total) by mouth daily. TAKE 1 &  1/2 (ONE & ONE-HALF) TABLETS BY MOUTH ONCE DAILY 135 tablet 3   nitroGLYCERIN (NITROSTAT) 0.4 MG SL tablet DISSOLVE ONE TABLET UNDER THE TONGUE EVERY 5 MINUTES AS NEEDED FOR CHEST PAIN.&nbsp;&nbsp;DO NOT EXCEED A TOTAL OF 3 DOSES IN 15 MINUTES 25 tablet 0   oxymetazoline (AFRIN) 0.05 % nasal spray Place 1 spray into both nostrils 2 (two) times daily as needed for congestion.     pantoprazole (PROTONIX) 40 MG tablet TAKE 1 TABLET BY MOUTH ONCE DAILY BEFORE BREAKFAST 90 tablet 0   rosuvastatin (CRESTOR) 5 MG tablet TAKE ONE TABLET BY MOUTH ON MONDAY, WEDNESDAY, AND FRIDAY 36 tablet 3   Semaglutide,0.25 or 0.5MG /DOS, (OZEMPIC, 0.25 OR 0.5 MG/DOSE,) 2 MG/1.5ML SOPN Inject 0.5 mg into the skin once a week. 4.5 mL 3   No current facility-administered medications for this visit.   Allergies:  Bee venom, Fish allergy, and Ivp dye [iodinated diagnostic agents]   Social History: The patient  reports that he quit smoking about 21 years ago. His smoking use included cigarettes. He has a 35.00 pack-year smoking history. He has never used smokeless tobacco. He reports current alcohol use. He reports that he does not use drugs.   Family History: The patient's family history includes Colon cancer in his mother; Coronary artery disease in his maternal grandmother.   ROS:  Please see the history of present illness. Otherwise, complete  review of systems is positive for none.  All other systems are reviewed and negative.   Physical Exam: VS:  BP (!) 138/98   Pulse 82   Ht 5\' 7"  (1.702 m)   Wt 159 lb 12.8 oz (72.5 kg)   SpO2 97%   BMI 25.03 kg/m , BMI Body mass index is 25.03 kg/m.  Wt Readings from Last 3 Encounters:  04/18/21 159 lb 12.8 oz (72.5 kg)  03/21/21 158 lb 12.8 oz (72 kg)  02/27/21 157 lb (71.2 kg)    General: Patient appears comfortable at rest. Neck: Supple, no elevated JVP or carotid bruits, no thyromegaly. Lungs: Clear to auscultation, nonlabored breathing at rest. Cardiac: Regular rate and rhythm, no S3 or significant systolic murmur, no pericardial rub. Extremities: No pitting edema, distal pulses 2+. Skin: Warm and dry. Musculoskeletal: No kyphosis. Neuropsychiatric: Alert and oriented x3, affect grossly appropriate.  ECG: EKG today 03/21/2021 normal sinus rhythm right bundle branch block rate of 82  Recent Labwork: 11/27/2020: ALT 18; AST 19; Hemoglobin 13.1; Platelets 219 02/27/2021: BUN 10; Creatinine, Ser 0.83; Potassium 3.8; Sodium 140     Component Value Date/Time   CHOL 141 09/11/2020 0853   TRIG 134 09/11/2020 0853   HDL 48 09/11/2020 0853   CHOLHDL 2.9 09/11/2020 0853   CHOLHDL 4.3 05/27/2017 0844   VLDL 51 (H) 05/27/2017 0844   LDLCALC 69 09/11/2020 0853    Other Studies Reviewed Today:  Lexiscan Myoview 03/28/2021 The study is normal. There are no perfusion defects consistent with prior infarct or current ischemia. The study is low risk.   No ST deviation was noted.   There is a moderate size moderate intensity inferior defect that is most intense in the resting images with normal wall motion. Findings consistent with diaphragmatic attenuation and artifact due to adjacet gut tracer uptake.   Left ventricular function is normal. Nuclear stress EF: 67 %. The left ventricular ejection fraction is hyperdynamic (>65%). End diastolic cavity size is normal.      Echocardiogram  07/31/2020  1. Left ventricular ejection fraction, by estimation, is 60 to  65%. The left ventricle has normal function. The left ventricle has no regional wall motion abnormalities. Left ventricular diastolic parameters are consistent with Grade I diastolic dysfunction (impaired relaxation). 2. Right ventricular systolic function is normal. The right ventricular size is normal. Tricuspid regurgitation signal is inadequate for assessing PA pressure. 3. The mitral valve is grossly normal. Trivial mitral valve regurgitation. 4. The aortic valve is tricuspid. There is mild calcification of the aortic valve. Aortic valve regurgitation is not visualized. 5. The inferior vena cava is normal in size with greater than 50% respiratory variability, suggesting right atrial pressure of 3 mmHg. Comparison(s): Echocardiogram done 10/13/18 showed an EF of 60-65%.   Cardiac cath 10/28/2019  Patent left main Patent LAD mid and distal stent. High-grade diffuse obstructive disease in a branching relatively small ramus intermedius.  Slight progression compared to 2019. Widely patent circumflex with first obtuse marginal containing a 50% proximal narrowing Widely patent dominant right coronary with 30 to 40% narrowing distally. Normal LV function with EF 55%.  LVEDP 20 mmHg.   RECOMMENDATIONS:   Continue current therapy Further medication adjustments and management per Dr.,[place] Dominance: Co-dominant  Intervention    Echocardiogram 10/13/2018 IMPRESSIONS   1. The left ventricle has normal systolic function with an ejection  fraction of 60-65%. The cavity size was normal. Left ventricular diastolic  Doppler parameters are consistent with impaired relaxation. No evidence of  left ventricular regional wall  motion abnormalities.   2. The right ventricle has normal systolic function. The cavity was  normal. There is no increase in right ventricular wall thickness.   3. The aortic valve is  tricuspid. Mild aortic annular calcification  noted.   4. The mitral valve is grossly normal. There is mild mitral annular  calcification present.   5. The tricuspid valve is grossly normal.   6. The aortic root is normal in size and structure   Assessment and Plan:   1. CAD in native artery/chest pain Status post cardiac catheterization 10/28/2019: Patent LAD mid and distal stent.  High-grade diffuse obstructive disease and branching relatively small RI, slight progression compared to 2019.  Widely patent circumflex  first obtuse marginal containing 50% proximal narrowing, widely patent dominant RCA with 30 to 40% narrowing.  EF 55%.  Recent Lexiscan stress test was considered low risk.  He has had no further chest pain since last visit.  Continue aspirin 81 mg daily, Imdur 90 mg daily, Toprol-XL 25 mg daily, as needed sublingual nitroglycerin   2. Essential hypertension Blood pressure is elevated today at 138/98.  He states his blood pressures at home are much better and usually less than 130/80 consistently per his statement.  Continue amlodipine 10 mg daily.  Continue losartan 100 mg daily.  Continue Toprol-XL 37.5 mg daily   3. Hyperlipidemia LDL goal <70 Recent lipid panel at PCP office on 06/05/2020: TC 151, TG 99, HDL 53, LDL 80.  Continue Crestor 5 mg on Mondays, Wednesdays, and Fridays.  Recent labs on 09/11/2020 by PCP showed improvement in LDL to 69.  Continue Zetia 10 mg daily.  Patient states he needs a refill on Zetia.  Please refill Zetia.  4.  DOE/shortness of breath Currently denies any DOE or shortness of breath.  Recent echo 07/31/2020 demonstrated EF 60 to 65%.  No WMA's.  G1 DD.  Trivial MR.  Medication Adjustments/Labs and Tests Ordered: Current medicines are reviewed at length with the patient today.  Concerns regarding medicines are outlined above.   Disposition: Follow-up with  Dr. Harl Bowie or APP 6 months  Signed, Levell July, NP 04/18/2021 9:09 AM    [facility] [place], [place], [place] [address] Phone: [phone]; Fax: [phone]

## 2021-04-18 ENCOUNTER — Ambulatory Visit: Payer: Medicare HMO | Admitting: Family Medicine

## 2021-04-18 ENCOUNTER — Encounter: Payer: Self-pay | Admitting: Family Medicine

## 2021-04-18 VITALS — BP 138/98 | HR 82 | Ht 67.0 in | Wt 159.8 lb

## 2021-04-18 DIAGNOSIS — R0602 Shortness of breath: Secondary | ICD-10-CM

## 2021-04-18 DIAGNOSIS — I2510 Atherosclerotic heart disease of native coronary artery without angina pectoris: Secondary | ICD-10-CM | POA: Diagnosis not present

## 2021-04-18 DIAGNOSIS — I10 Essential (primary) hypertension: Secondary | ICD-10-CM | POA: Diagnosis not present

## 2021-04-18 DIAGNOSIS — E785 Hyperlipidemia, unspecified: Secondary | ICD-10-CM

## 2021-04-18 MED ORDER — EZETIMIBE 10 MG PO TABS: 10.0000 mg | ORAL_TABLET | Freq: Every day | ORAL | 2 refills | Status: DC

## 2021-04-18 NOTE — Patient Instructions (Signed)

## 2021-04-29 ENCOUNTER — Telehealth: Payer: Self-pay

## 2021-04-29 NOTE — Chronic Care Management (AMB) (Signed)
Chronic Care Management Pharmacy Assistant   Name: Ronald Faulkner  MRN: [id number] DOB: 09-21-1950  Reason for Encounter: Disease State/ Hypertension  Recent office visits:  None  Recent consult visits:  03-28-2021 Verta Ellen., NP (Cardiology) Stress test performed.  04-18-2021 Verta Ellen., NP (Cardiology). Follow up in 6 months.  Hospital visits:  None in previous 6 months  Medications: Outpatient Encounter Medications as of 04/29/2021  Medication Sig   allopurinol (ZYLOPRIM) 300 MG tablet Take 1 tablet by mouth once daily   amLODipine (NORVASC) 10 MG tablet Take 1 tablet (10 mg total) by mouth daily.   aspirin EC 81 MG tablet Take 81 mg by mouth daily.   diphenhydrAMINE (BENADRYL) 50 MG tablet Take 1 tablet (50 mg total) by mouth as needed (once per cath instructions).   ezetimibe (ZETIA) 10 MG tablet Take 1 tablet (10 mg total) by mouth daily.   fluticasone (FLONASE) 50 MCG/ACT nasal spray Place 2 sprays into both nostrils daily.   glucose blood test strip 1 each by Other route as needed for other. Use as instructed   Insulin Pen Needle 32G X 6 MM MISC Use with ozempic   isosorbide mononitrate (IMDUR) 60 MG 24 hr tablet Take 1 tablet (60 mg total) by mouth daily.   linaclotide (LINZESS) 72 MCG capsule Take 72 mcg by mouth every other day.   losartan (COZAAR) 100 MG tablet Take 1 tablet by mouth once daily   metFORMIN (GLUCOPHAGE) 500 MG tablet Take 1 tablet by mouth once daily with breakfast   metoprolol succinate (TOPROL-XL) 25 MG 24 hr tablet Take 1.5 tablets (37.5 mg total) by mouth daily. TAKE 1 & 1/2 (ONE & ONE-HALF) TABLETS BY MOUTH ONCE DAILY   nitroGLYCERIN (NITROSTAT) 0.4 MG SL tablet DISSOLVE ONE TABLET UNDER THE TONGUE EVERY 5 MINUTES AS NEEDED FOR CHEST PAIN.&nbsp;&nbsp;DO NOT EXCEED A TOTAL OF 3 DOSES IN 15 MINUTES   oxymetazoline (AFRIN) 0.05 % nasal spray Place 1 spray into both nostrils 2 (two) times daily as needed for congestion.    pantoprazole (PROTONIX) 40 MG tablet TAKE 1 TABLET BY MOUTH ONCE DAILY BEFORE BREAKFAST   rosuvastatin (CRESTOR) 5 MG tablet TAKE ONE TABLET BY MOUTH ON MONDAY, WEDNESDAY, AND FRIDAY   Semaglutide,0.25 or 0.5MG /DOS, (OZEMPIC, 0.25 OR 0.5 MG/DOSE,) 2 MG/1.5ML SOPN Inject 0.5 mg into the skin once a week.   No facility-administered encounter medications on file as of 04/29/2021.  Reviewed chart prior to disease state call. Spoke with patient regarding BP  Recent Office Vitals: BP Readings from Last 3 Encounters:  04/18/21 (!) 138/98  03/21/21 (!) 140/98  02/27/21 126/84   Pulse Readings from Last 3 Encounters:  04/18/21 82  03/21/21 88  02/27/21 72    Wt Readings from Last 3 Encounters:  04/18/21 159 lb 12.8 oz (72.5 kg)  03/21/21 158 lb 12.8 oz (72 kg)  02/27/21 157 lb (71.2 kg)     Kidney Function Lab Results  Component Value Date/Time   CREATININE 0.83 02/27/2021 11:34 AM   CREATININE 1.09 11/27/2020 10:34 AM   GFRNONAA 81 06/05/2020 03:35 PM   GFRAA 93 06/05/2020 03:35 PM    BMP Latest Ref Rng & Units 02/27/2021 11/27/2020 09/11/2020  Glucose 70 - 99 mg/dL 101(H) 107(H) 127(H)  BUN 8 - 27 mg/dL 10 13 10   Creatinine 0.76 - 1.27 mg/dL 0.83 1.09 0.80  BUN/Creat Ratio 10 - 24 12 12 13   Sodium 134 - 144 mmol/L 140 142 141  Potassium 3.5 - 5.2 mmol/L 3.8 3.5 4.2  Chloride 96 - 106 mmol/L 101 102 103  CO2 20 - 29 mmol/L 21 19(L) 22  Calcium 8.6 - 10.2 mg/dL 9.6 9.4 9.6    Current antihypertensive regimen:  Losartan 100 mg daily Metoprolol Succinate 25 mg daily Isosorbide 60 mg daily Amlodipine 10 mg daily  How often are you checking your Blood Pressure? 3-5x per week  Current home BP readings: 127/77  What recent interventions/DTPs have been made by any provider to improve Blood Pressure control since last CPP Visit:  Educated on BP goals and benefits of medications for prevention of heart attack, stroke and kidney damage; Daily salt intake goal < 2300 mg; Proper BP  monitoring technique  Any recent hospitalizations or ED visits since last visit with CPP? No What diet changes have been made to improve Blood Pressure Control?  Patient states he has limited his salt intake, eats fruits/vegetables and drinks plenty of water  What exercise is being done to improve your Blood Pressure Control?  Patient states he walks 3-4 days out of the week.  Adherence Review: Is the patient currently on ACE/ARB medication? Yes Does the patient have >5 day gap between last estimated fill dates? No  Care Gaps: Shingrix overdue Covid booster overdue last completed 06-05-2020 AWV 09-12-2021  Star Rating Drugs: Metformin 500 mg- Last filled 03-13-2021 90 DS [facility] Rosuvastatin 5 mg- Last filled 03-15-2021 84 DS [facility] Ozempic 0.5 mg- Patient assistance Losartan 100 mg- Last filled 03-20-2021 90 DS  [place] Clinical Pharmacist Assistant [phone]

## 2021-05-06 ENCOUNTER — Other Ambulatory Visit (INDEPENDENT_AMBULATORY_CARE_PROVIDER_SITE_OTHER): Payer: Self-pay | Admitting: Internal Medicine

## 2021-05-15 ENCOUNTER — Telehealth: Payer: Self-pay

## 2021-05-15 NOTE — Chronic Care Management (AMB) (Signed)
° ° ° °  Ronald Faulkner was reminded to have all medications, supplements and any blood glucose and blood pressure readings available for review with [name], Pharm. D, at his telephone visit on 05-16-2021 at 4:00.   Care Gaps: Shingrix overdue Covid booster overdue last completed 06-05-2020 AWV 09-12-2021  Star Rating Drug: Metformin 500 mg- Last filled 03-13-2021 90 DS [facility] Rosuvastatin 5 mg- Last filled 03-15-2021 84 DS [facility] Ozempic 0.5 mg- Patient assistance Losartan 100 mg- Last filled 03-20-2021 90 DS  Any gaps in medications fill history? No  [place] Pharmacist Assistant [phone]

## 2021-05-16 ENCOUNTER — Ambulatory Visit (INDEPENDENT_AMBULATORY_CARE_PROVIDER_SITE_OTHER): Payer: Medicare HMO

## 2021-05-16 VITALS — BP 117/77

## 2021-05-16 DIAGNOSIS — E119 Type 2 diabetes mellitus without complications: Secondary | ICD-10-CM

## 2021-05-16 DIAGNOSIS — I10 Essential (primary) hypertension: Secondary | ICD-10-CM

## 2021-05-16 NOTE — Progress Notes (Signed)
Chronic Care Management Pharmacy Note  05/21/2021 Name:  Ronald Faulkner MRN:  [id number] DOB:  [birth year]  Summary: Patient report checking his BP at least 3-4 times per week.   Recommendations/Changes made from today's visit: Recommend patient receive COVID-19 booster vaccine   Plan: Patient reports that he is going to receive the vaccine.    Subjective: Ronald Faulkner is an 70 y.o. year old male who is a primary patient of [name], [place].  The CCM team was consulted for assistance with disease management and care coordination needs.    Engaged with patient by telephone for follow up visit in response to provider referral for pharmacy case management and/or care coordination services.   Consent to Services:  The patient was given information about Chronic Care Management services, agreed to services, and gave verbal consent prior to initiation of services.  Please see initial visit note for detailed documentation.   Patient Care Team: [name], FNP as PCP - General (General Practice) Mayford Knife, [facility] (Pharmacist)  Recent office visits: 02/27/2021 PCP OV  Recent consult visits: 04/18/2021 Cardiology OV 03/21/2021 Cardiology [facility] visits: None in previous 6 months   Objective:  Lab Results  Component Value Date   CREATININE 0.83 02/27/2021   BUN 10 02/27/2021   GFRNONAA 81 06/05/2020   GFRAA 93 06/05/2020   NA 140 02/27/2021   K 3.8 02/27/2021   CALCIUM 9.6 02/27/2021   CO2 21 02/27/2021   GLUCOSE 101 (H) 02/27/2021    Lab Results  Component Value Date/Time   HGBA1C 6.6 (H) 02/27/2021 11:34 AM   HGBA1C 6.7 (H) 11/27/2020 10:34 AM   MICROALBUR 150 09/05/2020 03:51 PM    Last diabetic Eye exam:  Lab Results  Component Value Date/Time   HMDIABEYEEXA No Retinopathy 08/22/2020 12:00 AM    Last diabetic Foot exam: No results found for: HMDIABFOOTEX   Lab Results  Component Value Date   CHOL 141 09/11/2020   HDL 48 09/11/2020    LDLCALC 69 09/11/2020   TRIG 134 09/11/2020   CHOLHDL 2.9 09/11/2020    Hepatic Function Latest Ref Rng & Units 11/27/2020 09/11/2020 06/05/2020  Total Protein 6.0 - 8.5 g/dL 7.1 7.0 7.3  Albumin 3.8 - 4.8 g/dL 4.3 4.1 4.3  AST 0 - 40 IU/L '19 10 15  ' ALT 0 - 44 IU/L '18 10 11  ' Alk Phosphatase 44 - 121 IU/L 158(H) 150(H) 161(H)  Total Bilirubin 0.0 - 1.2 mg/dL 0.2 0.2 0.3    No results found for: TSH, FREET4  CBC Latest Ref Rng & Units 11/27/2020 06/05/2020 10/27/2019  WBC 3.4 - 10.8 x10E3/uL 4.1 6.7 6.3  Hemoglobin 13.0 - 17.7 g/dL 13.1 13.4 12.7(L)  Hematocrit 37.5 - 51.0 % 40.3 41.1 40.6  Platelets 150 - 450 x10E3/uL 219 260 252    No results found for: VD25OH  Clinical ASCVD: Yes  The 10-year ASCVD risk score (Arnett DK, et al., 2019) is: 27.3%   Values used to calculate the score:     Age: 16 years     Sex: Male     Is Non-Hispanic African American: Yes     Diabetic: Yes     Tobacco smoker: No     Systolic Blood Pressure: 458 mmHg     Is BP treated: Yes     HDL Cholesterol: 48 mg/dL     Total Cholesterol: 141 mg/dL    Depression screen [facility] 2/9 09/05/2020 06/05/2020  Decreased Interest 0 0  Down,  Depressed, Hopeless 0 0  PHQ - 2 Score 0 0     Social History   Tobacco Use  Smoking Status Former   Packs/day: 1.00   Years: 35.00   Pack years: 35.00   Types: Cigarettes   Quit date: 06/03/1999   Years since quitting: 21.9  Smokeless Tobacco Never   BP Readings from Last 3 Encounters:  05/16/21 117/77  04/18/21 (!) 138/98  03/21/21 (!) 140/98   Pulse Readings from Last 3 Encounters:  04/18/21 82  03/21/21 88  02/27/21 72   Wt Readings from Last 3 Encounters:  04/18/21 159 lb 12.8 oz (72.5 kg)  03/21/21 158 lb 12.8 oz (72 kg)  02/27/21 157 lb (71.2 kg)   BMI Readings from Last 3 Encounters:  04/18/21 25.03 kg/m  03/21/21 24.87 kg/m  02/27/21 26.13 kg/m    Assessment/Interventions: Review of patient past medical history, allergies, medications, health  status, including review of consultants reports, laboratory and other test data, was performed as part of comprehensive evaluation and provision of chronic care management services.   SDOH:  (Social Determinants of Health) assessments and interventions performed: No  SDOH Screenings   Alcohol Screen: Not on file  Depression (PHQ2-9): Low Risk    PHQ-2 Score: 0  Financial Resource Strain: High Risk   Difficulty of Paying Living Expenses: Very hard  Food Insecurity: No Food Insecurity   Worried About Charity fundraiser in the Last Year: Never true   Ran Out of Food in the Last Year: Never true  Housing: Not on file  Physical Activity: Inactive   Days of Exercise per Week: 0 days   Minutes of Exercise per Session: 0 min  Social Connections: Not on file  Stress: No Stress Concern Present   Feeling of Stress : Not at all  Tobacco Use: Medium Risk   Smoking Tobacco Use: Former   Smokeless Tobacco Use: Never   Passive Exposure: Not on file  Transportation Needs: No Transportation Needs   Lack of Transportation (Medical): No   Lack of Transportation (Non-Medical): No    CCM Care Plan  Allergies  Allergen Reactions   Bee Venom    Fish Allergy Hives and Swelling   Ivp Dye [Iodinated Diagnostic Agents] Swelling    Medications Reviewed Today     Reviewed by Mayford Knife, RPH (Pharmacist) on 05/16/21 at [place] List Status: <None>   Medication Order Taking? Sig Documenting Provider Last Dose Status Informant  allopurinol (ZYLOPRIM) 300 MG tablet [id number] Yes Take 1 tablet by mouth once daily [name], FNP Taking Active   amLODipine (NORVASC) 10 MG tablet [id number] Yes Take 1 tablet (10 mg total) by mouth daily. Verta Ellen., NP Taking Active   aspirin EC 81 MG tablet [id number] Yes Take 81 mg by mouth daily. [provider] Taking Active Self  diphenhydrAMINE (BENADRYL) 50 MG tablet [id number] Yes Take 1 tablet (50 mg total) by mouth as needed (once per  cath instructions). Herminio Commons, MD Taking Active Self  ezetimibe (ZETIA) 10 MG tablet [id number] Yes Take 1 tablet (10 mg total) by mouth daily. Verta Ellen., NP Taking Active   fluticasone [facility]) 50 MCG/ACT nasal spray [id number] Yes Place 2 sprays into both nostrils daily. [provider] Taking Active   glucose blood test strip [id number] Yes 1 each by Other route as needed for other. Use as instructed [provider] Taking Active   Insulin Pen Needle 32G X 6 MM  [place] [id number] Yes Use with ozempic [name], FNP Taking Active   isosorbide mononitrate (IMDUR) 60 MG 24 hr tablet [id number] Yes Take 1 tablet (60 mg total) by mouth daily. Verta Ellen., NP Taking Active            Med Note Pricilla Holm May 16, 2021  4:10 PM) Patient taking 1 and 1/2 tablets (90 mg) daily. Increased by the cardiologist.   linaclotide Rolan Lipa) 72 MCG capsule [id number] Yes Take 72 mcg by mouth every other day. [provider] Taking Active Self  losartan (COZAAR) 100 MG tablet [id number] Yes Take 1 tablet by mouth once daily [name], FNP Taking Active   metFORMIN (GLUCOPHAGE) 500 MG tablet [id number] Yes Take 1 tablet by mouth once daily with breakfast [name], FNP Taking Active   metoprolol succinate (TOPROL-XL) 25 MG 24 hr tablet [id number] Yes Take 1.5 tablets (37.5 mg total) by mouth daily. TAKE 1 & 1/2 (ONE & ONE-HALF) TABLETS BY MOUTH ONCE DAILY Verta Ellen., NP Taking Active   nitroGLYCERIN (NITROSTAT) 0.4 MG SL tablet [id number] Yes DISSOLVE ONE TABLET UNDER THE TONGUE EVERY 5 MINUTES AS NEEDED FOR CHEST PAIN.&nbsp;&nbsp;DO NOT EXCEED A TOTAL OF 3 DOSES IN 15 MINUTES [name], FNP Taking Active   oxymetazoline (AFRIN) 0.05 % nasal spray [id number] Yes Place 1 spray into both nostrils 2 (two) times daily as needed for congestion. [provider] Taking Active            Med Note Doyce Loose Jul 29, 2017  6:19 PM)     pantoprazole (PROTONIX) 40 MG tablet [id number] Yes TAKE 1 TABLET BY MOUTH ONCE DAILY BEFORE BREAKFAST Rehman, Mechele Dawley, MD Taking Active   rosuvastatin (CRESTOR) 5 MG tablet [id number] Yes TAKE ONE TABLET BY MOUTH ON MONDAY, WEDNESDAY, AND FRIDAY Verta Ellen., NP Taking Active   Semaglutide,0.25 or 0.5MG/DOS, (OZEMPIC, 0.25 OR 0.5 MG/DOSE,) 2 MG/1.5ML SOPN [id number] Yes Inject 0.5 mg into the skin once a week. [name], FNP Taking Active             Patient Active Problem List   Diagnosis Date Noted   Seborrheic keratosis 05/20/2021   Coronary artery disease involving native coronary artery of native heart with angina pectoris ([place]) 06/05/2020   Nausea without vomiting 10/25/2019   Chronic constipation 07/07/2019   History of colonic polyps 07/07/2019   Unstable angina ([place]) 07/29/2017   Type 2 diabetes mellitus ([place]) 05/27/2017   Bilateral lower extremity edema    SOB (shortness of breath)    Family hx of colon cancer 09/18/2015   Gout 09/18/2015   Rectal bleeding 04/05/2012   GERD (gastroesophageal reflux disease) 04/05/2012   Atypical chest pain 12/08/2011   Abnormal EKG 12/08/2011   HTN (hypertension) 12/08/2011    Immunization History  Administered Date(s) Administered   Fluad Quad(high Dose 65+) 02/27/2021   Influenza, High Dose Seasonal PF 05/27/2017   Influenza-Unspecified 03/14/2020   Moderna Sars-Covid-2 Vaccination 07/28/2019, 08/26/2019, 06/05/2020   PNEUMOCOCCAL CONJUGATE-20 09/05/2020   Tdap 09/05/2020    Conditions to be addressed/monitored:  Hypertension and Hyperlipidemia  There are no care plans that you recently modified to display for this patient.    Medication Assistance:  Ozempic obtained through [place] nordisk medication assistance program.  Enrollment ends 05/2021  Compliance/Adherence/Medication fill history: Care Gaps: COVID-19 Vaccine booster - is going  Shingles Vaccine patient received at [employer]  Drugs: Metformin 500  mg tablet  Losartan 100 mg tablet  Rosuvastatin 5 mg tablet  Ozempic 0.5 mg once per week  Patient's preferred pharmacy is:  [facility] [address], [place] [address] Phone: [phone] Fax: [phone]  Uses pill box? Yes Pt endorses 95% compliance  We discussed: Benefits of medication synchronization, packaging and delivery as well as enhanced pharmacist oversight with [employer]. Patient decided to: Continue current medication management strategy  Care Plan and Follow Up Patient Decision:  Patient agrees to Care Plan and Follow-up.  Plan: The patient has been provided with contact information for the care management team and has been advised to call with any health related questions or concerns.   [name], CPP, PharmD Clinical Pharmacist Practitioner [facility] [phone]

## 2021-05-20 ENCOUNTER — Encounter: Payer: Self-pay | Admitting: Nurse Practitioner

## 2021-05-20 DIAGNOSIS — L821 Other seborrheic keratosis: Secondary | ICD-10-CM

## 2021-05-20 HISTORY — DX: Other seborrheic keratosis: L82.1

## 2021-05-21 NOTE — Patient Instructions (Signed)
Visit Information It was great speaking with you today!  Please let me know if you have any questions about our visit.   Goals Addressed             This Visit's Progress    Manage My Medicine       Timeframe:  Long-Range Goal Priority:  High Start Date:                             Expected End Date:                       Follow Up Date 11/20/2021  In Progress:   - call for medicine refill 2 or 3 days before it runs out - call if I am sick and can't take my medicine - keep a list of all the medicines I take; vitamins and herbals too - learn to read medicine labels - use a pillbox to sort medicine - use an alarm clock or phone to remind me to take my medicine    Why is this important?   These steps will help you keep on track with your medicines.           Patient Care Plan: CCM Pharmacy Care Plan     Problem Identified: HTN,  DM II      Goal: Disease Management   Recent Progress: On track  Priority: High  Note:   Current Barriers:  Unable to independently monitor therapeutic efficacy  Pharmacist Clinical Goal(s):  Patient will achieve adherence to monitoring guidelines and medication adherence to achieve therapeutic efficacy through collaboration with PharmD and provider.   Interventions: 1:1 collaboration with [name], FNP regarding development and update of comprehensive plan of care as evidenced by provider attestation and co-signature Inter-disciplinary care team collaboration (see longitudinal plan of care) Comprehensive medication review performed; medication list updated in electronic medical record  Hypertension (BP goal <130/80) -Uncontrolled -Current treatment: Losartan 100 mg tablet once per day  Metoprolol Succinate 25 mg tablet once per day Isosorbide 60 mg tablet once per day Amlodipine 10 mg tablet once per day -Medications previously tried: none noted   -Current home readings: 154/98, 140/98 -Current dietary habits: not currently  using any salt -Denies hypotensive/hypertensive symptoms -Educated on BP goals and benefits of medications for prevention of heart attack, stroke and kidney damage; Daily salt intake goal < 2300 mg; Proper BP monitoring technique; -Patient to have stress test done at [facility]   -Patient reports that he drank a cup of   coffee this morning around 8:30 pm, he received a handout prior to his stress test but has not read the information yet.  -Counseled to monitor BP at home at least 5 days per week, document, and provide log at future appointments -Recommended to continue current medication Counseled on the importance of checking BP on a schedule  Diabetes (A1c goal <7%) -Controlled -Current medications: Ozempic 0.5 mg inject once per week  -Current home glucose readings: none reported during this visit  -Denies hypoglycemic/hyperglycemic symptoms -Current meal patterns: patient reports eating the same as he has in the past -Educated on A1c and blood sugar goals; -Counseled to check feet daily and get yearly eye exams -Recommended to continue current medication  Patient Goals/Self-Care Activities Patient will:  - take medications as prescribed  Follow Up Plan: The patient has been provided with contact information for the care management team and  has been advised to call with any health related questions or concerns.       Patient agreed to services and verbal consent obtained.   The patient verbalized understanding of instructions, educational materials, and care plan provided today and agreed to receive a mailed copy of patient instructions, educational materials, and care plan.   [name], PharmD Clinical Pharmacist [facility] [phone]

## 2021-05-29 ENCOUNTER — Telehealth (INDEPENDENT_AMBULATORY_CARE_PROVIDER_SITE_OTHER): Payer: Medicare HMO | Admitting: Nurse Practitioner

## 2021-05-29 ENCOUNTER — Other Ambulatory Visit: Payer: Self-pay

## 2021-05-29 ENCOUNTER — Encounter: Payer: Self-pay | Admitting: Nurse Practitioner

## 2021-05-29 VITALS — BP 125/73 | HR 94 | Temp 99.3°F | Ht 67.0 in | Wt 159.0 lb

## 2021-05-29 DIAGNOSIS — G4700 Insomnia, unspecified: Secondary | ICD-10-CM

## 2021-05-29 DIAGNOSIS — U071 COVID-19: Secondary | ICD-10-CM | POA: Diagnosis not present

## 2021-05-29 DIAGNOSIS — R509 Fever, unspecified: Secondary | ICD-10-CM

## 2021-05-29 DIAGNOSIS — R051 Acute cough: Secondary | ICD-10-CM | POA: Diagnosis not present

## 2021-05-29 LAB — POC INFLUENZA A&B (BINAX/QUICKVUE)
Influenza A, POC: NEGATIVE
Influenza B, POC: NEGATIVE

## 2021-05-29 LAB — POC COVID19 BINAXNOW: SARS Coronavirus 2 Ag: POSITIVE — AB

## 2021-05-29 MED ORDER — AZITHROMYCIN 250 MG PO TABS: ORAL_TABLET | ORAL | 0 refills | Status: AC

## 2021-05-29 MED ORDER — MELATONIN 3 MG PO TABS: 3.0000 mg | ORAL_TABLET | Freq: Every day | ORAL | 0 refills | Status: DC

## 2021-05-29 MED ORDER — NIRMATRELVIR/RITONAVIR (PAXLOVID)TABLET
3.0000 | ORAL_TABLET | Freq: Two times a day (BID) | ORAL | 0 refills | Status: AC
Start: 2021-05-29 — End: 2021-06-03

## 2021-05-29 NOTE — Patient Instructions (Addendum)
COVID-19: Quarantine and Isolation °Quarantine °If you were exposed °Quarantine and stay away from others when you have been in close contact with someone who has COVID-19. °Isolate °If you are sick or test positive °Isolate when you are sick or when you have COVID-19, even if you don't have symptoms. °When to stay home °Calculating quarantine °The date of your exposure is considered day 0. Day 1 is the first full day after your last contact with a person who has had COVID-19. Stay home and away from other people for at least 5 days. Learn why CDC updated guidance for the general public. °IF YOU were exposed to COVID-19 and are NOT  °up to dateIF YOU were exposed to COVID-19 and are NOT on COVID-19 vaccinations °Quarantine for at least 5 days °Stay home °Stay home and quarantine for at least 5 full days. °Wear a well-fitting mask if you must be around others in your home. °Do not travel. °Get tested °Even if you don't develop symptoms, get tested at least 5 days after you last had close contact with someone with COVID-19. °After quarantine °Watch for symptoms °Watch for symptoms until 10 days after you last had close contact with someone with COVID-19. °Avoid travel °It is best to avoid travel until a full 10 days after you last had close contact with someone with COVID-19. °If you develop symptoms °Isolate immediately and get tested. Continue to stay home until you know the results. Wear a well-fitting mask around others. °Take precautions until day 10 °Wear a well-fitting mask °Wear a well-fitting mask for 10 full days any time you are around others inside your home or in public. Do not go to places where you are unable to wear a well-fitting mask. °If you must travel during days 6-10, take precautions. °Avoid being around people who are more likely to get very sick from COVID-19. °IF YOU were exposed to COVID-19 and are  °up to dateIF YOU were exposed to COVID-19 and are on COVID-19 vaccinations °No  quarantine °You do not need to stay home unless you develop symptoms. °Get tested °Even if you don't develop symptoms, get tested at least 5 days after you last had close contact with someone with COVID-19. °Watch for symptoms °Watch for symptoms until 10 days after you last had close contact with someone with COVID-19. °If you develop symptoms °Isolate immediately and get tested. Continue to stay home until you know the results. Wear a well-fitting mask around others. °Take precautions until day 10 °Wear a well-fitting mask °Wear a well-fitting mask for 10 full days any time you are around others inside your home or in public. Do not go to places where you are unable to wear a well-fitting mask. °Take precautions if traveling °Avoid being around people who are more likely to get very sick from COVID-19. °IF YOU were exposed to COVID-19 and had confirmed COVID-19 within the past 90 days (you tested positive using a viral test) °No quarantine °You do not need to stay home unless you develop symptoms. °Watch for symptoms °Watch for symptoms until 10 days after you last had close contact with someone with COVID-19. °If you develop symptoms °Isolate immediately and get tested. Continue to stay home until you know the results. Wear a well-fitting mask around others. °Take precautions until day 10 °Wear a well-fitting mask °Wear a well-fitting mask for 10 full days any time you are around others inside your home or in public. Do not go to places where you are   unable to wear a well-fitting mask. °Take precautions if traveling °Avoid being around people who are more likely to get very sick from COVID-19. °Calculating isolation °Day 0 is your first day of symptoms or a positive viral test. Day 1 is the first full day after your symptoms developed or your test specimen was collected. If you have COVID-19 or have symptoms, isolate for at least 5 days. °IF YOU tested positive for COVID-19 or have symptoms, regardless of  vaccination status °Stay home for at least 5 days °Stay home for 5 days and isolate from others in your home. °Wear a well-fitting mask if you must be around others in your home. °Do not travel. °Ending isolation if you had symptoms °End isolation after 5 full days if you are fever-free for 24 hours (without the use of fever-reducing medication) and your symptoms are improving. °Ending isolation if you did NOT have symptoms °End isolation after at least 5 full days after your positive test. °If you got very sick from COVID-19 or have a weakened immune system °You should isolate for at least 10 days. Consult your doctor before ending isolation. °Take precautions until day 10 °Wear a well-fitting mask °Wear a well-fitting mask for 10 full days any time you are around others inside your home or in public. Do not go to places where you are unable to wear a well-fitting mask. °Do not travel °Do not travel until a full 10 days after your symptoms started or the date your positive test was taken if you had no symptoms. °Avoid being around people who are more likely to get very sick from COVID-19. °Definitions °Exposure °Contact with someone infected with SARS-CoV-2, the virus that causes COVID-19, in a way that increases the likelihood of getting infected with the virus. °Close contact °A close contact is someone who was less than 6 feet away from an infected person (laboratory-confirmed or a clinical diagnosis) for a cumulative total of 15 minutes or more over a 24-hour period. For example, three individual 5-minute exposures for a total of 15 minutes. People who are exposed to someone with COVID-19 after they completed at least 5 days of isolation are not considered close contacts. °Quarantine °Quarantine is a strategy used to prevent transmission of COVID-19 by keeping people who have been in close contact with someone with COVID-19 apart from others. °Who does not need to quarantine? °If you had close contact with  someone with COVID-19 and you are in one of the following groups, you do not need to quarantine. °You are up to date with your COVID-19 vaccines. °You had confirmed COVID-19 within the last 90 days (meaning you tested positive using a viral test). °If you are up to date with COVID-19 vaccines, you should wear a well-fitting mask around others for 10 days from the date of your last close contact with someone with COVID-19 (the date of last close contact is considered day 0). Get tested at least 5 days after you last had close contact with someone with COVID-19. If you test positive or develop COVID-19 symptoms, isolate from other people and follow recommendations in the Isolation section below. If you tested positive for COVID-19 with a viral test within the previous 90 days and subsequently recovered and remain without COVID-19 symptoms, you do not need to quarantine or get tested after close contact. You should wear a well-fitting mask around others for 10 days from the date of your last close contact with someone with COVID-19 (the date of last   close contact is considered day 0). If you have COVID-19 symptoms, get tested and isolate from other people and follow recommendations in the Isolation section below. °Who should quarantine? °If you come into close contact with someone with COVID-19, you should quarantine if you are not up to date on COVID-19 vaccines. This includes people who are not vaccinated. °What to do for quarantine °Stay home and away from other people for at least 5 days (day 0 through day 5) after your last contact with a person who has COVID-19. The date of your exposure is considered day 0. Wear a well-fitting mask when around others at home, if possible. °For 10 days after your last close contact with someone with COVID-19, watch for fever (100.4°F or greater), cough, shortness of breath, or other COVID-19 symptoms. °If you develop symptoms, get tested immediately and isolate until you receive  your test results. If you test positive, follow isolation recommendations. °If you do not develop symptoms, get tested at least 5 days after you last had close contact with someone with COVID-19. °If you test negative, you can leave your home, but continue to wear a well-fitting mask when around others at home and in public until 10 days after your last close contact with someone with COVID-19. °If you test positive, you should isolate for at least 5 days from the date of your positive test (if you do not have symptoms). If you do develop COVID-19 symptoms, isolate for at least 5 days from the date your symptoms began (the date the symptoms started is day 0). Follow recommendations in the isolation section below. °If you are unable to get a test 5 days after last close contact with someone with COVID-19, you can leave your home after day 5 if you have been without COVID-19 symptoms throughout the 5-day period. Wear a well-fitting mask for 10 days after your date of last close contact when around others at home and in public. °Avoid people who are have weakened immune systems or are more likely to get very sick from COVID-19, and nursing homes and other high-risk settings, until after at least 10 days. °If possible, stay away from people you live with, especially people who are at higher risk for getting very sick from COVID-19, as well as others outside your home throughout the full 10 days after your last close contact with someone with COVID-19. °If you are unable to quarantine, you should wear a well-fitting mask for 10 days when around others at home and in public. °If you are unable to wear a mask when around others, you should continue to quarantine for 10 days. Avoid people who have weakened immune systems or are more likely to get very sick from COVID-19, and nursing homes and other high-risk settings, until after at least 10 days. °See additional information about travel. °Do not go to places where you are  unable to wear a mask, such as restaurants and some gyms, and avoid eating around others at home and at work until after 10 days after your last close contact with someone with COVID-19. °After quarantine °Watch for symptoms until 10 days after your last close contact with someone with COVID-19. °If you have symptoms, isolate immediately and get tested. °Quarantine in high-risk congregate settings °In certain congregate settings that have high risk of secondary transmission (such as correctional and detention facilities, homeless shelters, or cruise ships), CDC recommends a 10-day quarantine for residents, regardless of vaccination and booster status. During periods of critical staffing   shortages, facilities may consider shortening the quarantine period for staff to ensure continuity of operations. Decisions to shorten quarantine in these settings should be made in consultation with state, local, tribal, or territorial health departments and should take into consideration the context and characteristics of the facility. CDC's setting-specific guidance provides additional recommendations for these settings. °Isolation °Isolation is used to separate people with confirmed or suspected COVID-19 from those without COVID-19. People who are in isolation should stay home until it's safe for them to be around others. At home, anyone sick or infected should separate from others, or wear a well-fitting mask when they need to be around others. People in isolation should stay in a specific "sick room" or area and use a separate bathroom if available. Everyone who has presumed or confirmed COVID-19 should stay home and isolate from other people for at least 5 full days (day 0 is the first day of symptoms or the date of the day of the positive viral test for asymptomatic persons). They should wear a mask when around others at home and in public for an additional 5 days. People who are confirmed to have COVID-19 or are showing  symptoms of COVID-19 need to isolate regardless of their vaccination status. This includes: °People who have a positive viral test for COVID-19, regardless of whether or not they have symptoms. °People with symptoms of COVID-19, including people who are awaiting test results or have not been tested. People with symptoms should isolate even if they do not know if they have been in close contact with someone with COVID-19. °What to do for isolation °Monitor your symptoms. If you have an emergency warning sign (including trouble breathing), seek emergency medical care immediately. °Stay in a separate room from other household members, if possible. °Use a separate bathroom, if possible. °Take steps to improve ventilation at home, if possible. °Avoid contact with other members of the household and pets. °Don't share personal household items, like cups, towels, and utensils. °Wear a well-fitting mask when you need to be around other people. °Learn more about what to do if you are sick and how to notify your contacts. °Ending isolation for people who had COVID-19 and had symptoms °If you had COVID-19 and had symptoms, isolate for at least 5 days. To calculate your 5-day isolation period, day 0 is your first day of symptoms. Day 1 is the first full day after your symptoms developed. You can leave isolation after 5 full days. °You can end isolation after 5 full days if you are fever-free for 24 hours without the use of fever-reducing medication and your other symptoms have improved (Loss of taste and smell may persist for weeks or months after recovery and need not delay the end of isolation). °You should continue to wear a well-fitting mask around others at home and in public for 5 additional days (day 6 through day 10) after the end of your 5-day isolation period. If you are unable to wear a mask when around others, you should continue to isolate for a full 10 days. Avoid people who have weakened immune systems or are more  likely to get very sick from COVID-19, and nursing homes and other high-risk settings, until after at least 10 days. °If you continue to have fever or your other symptoms have not improved after 5 days of isolation, you should wait to end your isolation until you are fever-free for 24 hours without the use of fever-reducing medication and your other symptoms have improved.   Continue to wear a well-fitting mask through day 10. Contact your healthcare provider if you have questions. °See additional information about travel. °Do not go to places where you are unable to wear a mask, such as restaurants and some gyms, and avoid eating around others at home and at work until a full 10 days after your first day of symptoms. °If an individual has access to a test and wants to test, the best approach is to use an antigen test1 towards the end of the 5-day isolation period. Collect the test sample only if you are fever-free for 24 hours without the use of fever-reducing medication and your other symptoms have improved (loss of taste and smell may persist for weeks or months after recovery and need not delay the end of isolation). If your test result is positive, you should continue to isolate until day 10. If your test result is negative, you can end isolation, but continue to wear a well-fitting mask around others at home and in public until day 10. Follow additional recommendations for masking and avoiding travel as described above. °1As noted in the labeling for authorized over-the counter antigen tests: Negative results should be treated as presumptive. Negative results do not rule out SARS-CoV-2 infection and should not be used as the sole basis for treatment or patient management decisions, including infection control decisions. To improve results, antigen tests should be used twice over a three-day period with at least 24 hours and no more than 48 hours between tests. °Note that these recommendations on ending isolation  do not apply to people who are moderately ill or very sick from COVID-19 or have weakened immune systems. See section below for recommendations for when to end isolation for these groups. °Ending isolation for people who tested positive for COVID-19 but had no symptoms °If you test positive for COVID-19 and never develop symptoms, isolate for at least 5 days. Day 0 is the day of your positive viral test (based on the date you were tested) and day 1 is the first full day after the specimen was collected for your positive test. You can leave isolation after 5 full days. °If you continue to have no symptoms, you can end isolation after at least 5 days. °You should continue to wear a well-fitting mask around others at home and in public until day 10 (day 6 through day 10). If you are unable to wear a mask when around others, you should continue to isolate for 10 days. Avoid people who have weakened immune systems or are more likely to get very sick from COVID-19, and nursing homes and other high-risk settings, until after at least 10 days. °If you develop symptoms after testing positive, your 5-day isolation period should start over. Day 0 is your first day of symptoms. Follow the recommendations above for ending isolation for people who had COVID-19 and had symptoms. °See additional information about travel. °Do not go to places where you are unable to wear a mask, such as restaurants and some gyms, and avoid eating around others at home and at work until 10 days after the day of your positive test. °If an individual has access to a test and wants to test, the best approach is to use an antigen test1 towards the end of the 5-day isolation period. If your test result is positive, you should continue to isolate until day 10. If your test result is positive, you can also choose to test daily and if your test result   is negative, you can end isolation, but continue to wear a well-fitting mask around others at home and in  public until day 10. Follow additional recommendations for masking and avoiding travel as described above. °1As noted in the labeling for authorized over-the counter antigen tests: Negative results should be treated as presumptive. Negative results do not rule out SARS-CoV-2 infection and should not be used as the sole basis for treatment or patient management decisions, including infection control decisions. To improve results, antigen tests should be used twice over a three-day period with at least 24 hours and no more than 48 hours between tests. °Ending isolation for people who were moderately or very sick from COVID-19 or have a weakened immune system °People who are moderately ill from COVID-19 (experiencing symptoms that affect the lungs like shortness of breath or difficulty breathing) should isolate for 10 days and follow all other isolation precautions. To calculate your 10-day isolation period, day 0 is your first day of symptoms. Day 1 is the first full day after your symptoms developed. If you are unsure if your symptoms are moderate, talk to a healthcare provider for further guidance. °People who are very sick from COVID-19 (this means people who were hospitalized or required intensive care or ventilation support) and people who have weakened immune systems might need to isolate at home longer. They may also require testing with a viral test to determine when they can be around others. CDC recommends an isolation period of at least 10 and up to 20 days for people who were very sick from COVID-19 and for people with weakened immune systems. Consult with your healthcare provider about when you can resume being around other people. If you are unsure if your symptoms are severe or if you have a weakened immune system, talk to a healthcare provider for further guidance. °People who have a weakened immune system should talk to their healthcare provider about the potential for reduced immune responses to  COVID-19 vaccines and the need to continue to follow current prevention measures (including wearing a well-fitting mask and avoiding crowds and poorly ventilated indoor spaces) to protect themselves against COVID-19 until advised otherwise by their healthcare provider. Close contacts of immunocompromised people--including household members--should also be encouraged to receive all recommended COVID-19 vaccine doses to help protect these people. °Isolation in high-risk congregate settings °In certain high-risk congregate settings that have high risk of secondary transmission and where it is not feasible to cohort people (such as correctional and detention facilities, homeless shelters, and cruise ships), CDC recommends a 10-day isolation period for residents. During periods of critical staffing shortages, facilities may consider shortening the isolation period for staff to ensure continuity of operations. Decisions to shorten isolation in these settings should be made in consultation with state, local, tribal, or territorial health departments and should take into consideration the context and characteristics of the facility. CDC's setting-specific guidance provides additional recommendations for these settings. °This CDC guidance is meant to supplement--not replace--any federal, state, local, territorial, or tribal health and safety laws, rules, and regulations. °Recommendations for specific settings °These recommendations do not apply to healthcare professionals. For guidance specific to these settings, see °Healthcare professionals: Interim Guidance for Managing Healthcare Personnel with SARS-CoV-2 Infection or Exposure to SARS-CoV-2 °Patients, residents, and visitors to healthcare settings: Interim Infection Prevention and Control Recommendations for Healthcare Personnel During the Coronavirus Disease 2019 (COVID-19) Pandemic °Additional setting-specific guidance and recommendations are available. °These  recommendations on quarantine and isolation do apply to K[employer] School   settings. Additional guidance is available here: Overview of COVID-19 Quarantine for K-12 Schools °Travelers: Travel information and recommendations °Congregate facilities and other settings: guidance pages for community, work, and school settings °Ongoing COVID-19 exposure FAQs °I live with someone with COVID-19, but I cannot be separated from them. How do we manage quarantine in this situation? °It is very important for people with COVID-19 to remain apart from other people, if possible, even if they are living together. If separation of the person with COVID-19 from others that they live with is not possible, the other people that they live with will have ongoing exposure, meaning they will be repeatedly exposed until that person is no longer able to spread the virus to other people. In this situation, there are precautions you can take to limit the spread of COVID-19: °The person with COVID-19 and everyone they live with should wear a well-fitting mask inside the home. °If possible, one person should care for the person with COVID-19 to limit the number of people who are in close contact with the infected person. °Take steps to protect yourself and others to reduce transmission in the home: °Quarantine if you are not up to date with your COVID-19 vaccines. °Isolate if you are sick or tested positive for COVID-19, even if you don't have symptoms. °Learn more about the public health recommendations for testing, mask use and quarantine of close contacts, like yourself, who have ongoing exposure. These recommendations differ depending on your vaccination status. °What should I do if I have ongoing exposure to COVID-19 from someone I live with? °Recommendations for this situation depend on your vaccination status: °If you are not up to date on COVID-19 vaccines and have ongoing exposure to COVID-19, you should: °Begin quarantine immediately and  continue to quarantine throughout the isolation period of the person with COVID-19. °Continue to quarantine for an additional 5 days starting the day after the end of isolation for the person with COVID-19. °Get tested at least 5 days after the end of isolation of the infected person that lives with them. °If you test negative, you can leave the home but should continue to wear a well-fitting mask when around others at home and in public until 10 days after the end of isolation for the person with COVID-19. °Isolate immediately if you develop symptoms of COVID-19 or test positive. °If you are up to date with COVID-19 vaccines and have ongoing exposure to COVID-19, you should: °Get tested at least 5 days after your first exposure. A person with COVID-19 is considered infectious starting 2 days before they develop symptoms, or 2 days before the date of their positive test if they do not have symptoms. °Get tested again at least 5 days after the end of isolation for the person with COVID-19. °Wear a well-fitting mask when you are around the person with COVID-19, and do this throughout their isolation period. °Wear a well-fitting mask around others for 10 days after the infected person's isolation period ends. °Isolate immediately if you develop symptoms of COVID-19 or test positive. °What should I do if multiple people I live with test positive for COVID-19 at different times? °Recommendations for this situation depend on your vaccination status: °If you are not up to date with your COVID-19 vaccines, you should: °Quarantine throughout the isolation period of any infected person that you live with. °Continue to quarantine until 5 days after the end of isolation date for the most recently infected person that lives with you. For example, if   the last day of isolation of the person most recently infected with COVID-19 was June 30, the new 5-day quarantine period starts on July 1. Get tested at least 5 days after the end  of isolation for the most recently infected person that lives with you. Wear a well-fitting mask when you are around any person with COVID-19 while that person is in isolation. Wear a well-fitting mask when you are around other people until 10 days after your last close contact. Isolate immediately if you develop symptoms of COVID-19 or test positive. If you are up to date with your COVID-19 vaccines, you should: Get tested at least 5 days after your first exposure. A person with COVID-19 is considered infectious starting 2 days before they developed symptoms, or 2 days before the date of their positive test if they do not have symptoms. Get tested again at least 5 days after the end of isolation for the most recently infected person that lives with you. Wear a well-fitting mask when you are around any person with COVID-19 while that person is in isolation. Wear a well-fitting mask around others for 10 days after the end of isolation for the most recently infected person that lives with you. For example, if the last day of isolation for the person most recently infected with COVID-19 was June 30, the new 10-day period to wear a well-fitting mask indoors in public starts on July 1. Isolate immediately if you develop symptoms of COVID-19 or test positive. I had COVID-19 and completed isolation. Do I have to quarantine or get tested if someone I live with gets COVID-19 shortly after I completed isolation? No. If you recently completed isolation and someone that lives with you tests positive for the virus that causes COVID-19 shortly after the end of your isolation period, you do not have to quarantine or get tested as long as you do not develop new symptoms. Once all of the people that live together have completed isolation or quarantine, refer to the guidance below for new exposures to COVID-19. If you had COVID-19 in the previous 90 days and then came into close contact with someone with COVID-19, you do  not have to quarantine or get tested if you do not have symptoms. But you should: Wear a well-fitting mask indoors in public for 10 days after your last close contact. Monitor for COVID-19 symptoms for 10 days from the date of your last close contact. Isolate immediately and get tested if symptoms develop. If more than 90 days have passed since your recovery from infection, follow CDC's recommendations for close contacts. These recommendations will differ depending on your vaccination status. 08/29/2020 Content source: [facility] for Immunization and Respiratory Diseases ([facility]), Division of Viral Diseases This information is not intended to replace advice given to you by your health care provider. Make sure you discuss any questions you have with your health care provider. Document Revised: 01/02/2021 Document Reviewed: 01/02/2021 Elsevier Patient Education  [place] patient to take Vitamin C, D, Zinc.  Keep yourself hydrated with a lot of water and rest. Take Delsym for cough and Mucinex as need. Take Tylenol or pain reliever every 4-6 hours as needed for pain/fever/body ache. If you have elevated blood pressure, you can take OTC Corcidin. You can also take OTC oscillococcinum to help with your symptoms.

## 2021-05-29 NOTE — Progress Notes (Signed)
Virtual Visit via Mychart   This visit type was conducted due to national recommendations for restrictions regarding the COVID-19 Pandemic (e.g. social distancing) in an effort to limit this patient's exposure and mitigate transmission in our community.  Due to his co-morbid illnesses, this patient is at least at moderate risk for complications without adequate follow up.  This format is felt to be most appropriate for this patient at this time.  All issues noted in this document were discussed and addressed.  A limited physical exam was performed with this format.    This visit type was conducted due to national recommendations for restrictions regarding the COVID-19 Pandemic (e.g. social distancing) in an effort to limit this patient's exposure and mitigate transmission in our community.  Patients identity confirmed using two different identifiers.  This format is felt to be most appropriate for this patient at this time.  All issues noted in this document were discussed and addressed.  No physical exam was performed (except for noted visual exam findings with Video Visits).    Date:  07/02/2021   ID:  Ronald Faulkner, DOB 1950/10/05, MRN [id number]  Patient Location:  Home - spoke with [name]  Provider location:   Office    Chief Complaint:  cold symptoms  History of Present Illness:    Ronald Faulkner is a 70 y.o. male who presents via video conferencing for a telehealth visit today.    The patient does have symptoms concerning for COVID-19 infection (fever, chills, cough, or new shortness of breath).   Virtual visit due to having a fever, runny nose, back pain and productive cough (yellow color).  Fever was up to 102 on Sunday. He has taken Ibuprofen.  Fatigue, reports is "not tasting right".  Nauseated but know diarrhea    Past Medical History:  Diagnosis Date   Arthritis    CAD (coronary artery disease)    a. s/p DES to mid and distal LAD in 07/2017 with medical management  recommended of intermediate branch   Cancer 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 Tarri Aswad on 05/08/2021 -Pigmented   Unstable angina ([place]) 07/2017   Past Surgical History:  Procedure Laterality Date   ABDOMINAL SURGERY     APPENDECTOMY     BREAST SURGERY     benign lump at lt side   COLON SURGERY     COLONOSCOPY  03/14/2011   Procedure: COLONOSCOPY;  Surgeon: [name], MD;  [facility];  Service: Endoscopy;  Laterality: N/A;  1:00   COLONOSCOPY N/A 11/15/2015   Procedure: COLONOSCOPY;  Surgeon: [name], MD;  [facility];  Service: Endoscopy;  Laterality: N/A;  11:15   COLONOSCOPY WITH PROPOFOL N/A 07/15/2019   Procedure: COLONOSCOPY WITH PROPOFOL;  Surgeon: [name], MD;  [facility];  Service: Endoscopy;  Laterality: N/A;  1235   CORONARY STENT INTERVENTION N/A 07/31/2017   Procedure: CORONARY STENT INTERVENTION;  Surgeon: [name], MD;  Location: [place] [facility];  Service: Cardiovascular;  Laterality: N/A;   LEFT HEART CATH AND CORONARY ANGIOGRAPHY N/A 07/31/2017  Procedure: LEFT HEART CATH AND CORONARY ANGIOGRAPHY;  Surgeon: [name], MD;  Location: [place] [facility];  Service: Cardiovascular;  Laterality: N/A;   LEFT HEART CATH AND CORONARY ANGIOGRAPHY N/A 10/28/2019   Procedure: LEFT HEART CATH AND CORONARY ANGIOGRAPHY;  Surgeon: Belva Crome, MD;  Location: [place] [facility];  Service: Cardiovascular;  Laterality: N/A;   PENILE PROSTHESIS IMPLANT     2016    prostate cancer     2015. prostectomy     Current Meds  Medication Sig   allopurinol (ZYLOPRIM) 300 MG tablet Take 1 tablet by mouth  once daily   amLODipine (NORVASC) 10 MG tablet Take 1 tablet (10 mg total) by mouth daily.   aspirin EC 81 MG tablet Take 81 mg by mouth daily.   [EXPIRED] azithromycin (ZITHROMAX) 250 MG tablet Take 2 tablets (500 mg) on  Day 1,  followed by 1 tablet (250 mg) once daily on Days 2 through 5.   diphenhydrAMINE (BENADRYL) 50 MG tablet Take 1 tablet (50 mg total) by mouth as needed (once per cath instructions).   ezetimibe (ZETIA) 10 MG tablet Take 1 tablet (10 mg total) by mouth daily. (Patient not taking: Reported on 07/02/2021)   fluticasone (FLONASE) 50 MCG/ACT nasal spray Place 2 sprays into both nostrils daily.   glucose blood test strip 1 each by Other route as needed for other. Use as instructed   Insulin Pen Needle 32G X 6 MM MISC Use with ozempic   isosorbide mononitrate (IMDUR) 60 MG 24 hr tablet Take 1 tablet (60 mg total) by mouth daily.   linaclotide (LINZESS) 72 MCG capsule Take 72 mcg by mouth every other day.   melatonin 3 MG TABS tablet Take 1 tablet (3 mg total) by mouth at bedtime.   metoprolol succinate (TOPROL-XL) 25 MG 24 hr tablet Take 1.5 tablets (37.5 mg total) by mouth daily. TAKE 1 & 1/2 (ONE & ONE-HALF) TABLETS BY MOUTH ONCE DAILY   [EXPIRED] nirmatrelvir/ritonavir EUA (PAXLOVID) 20 x 150 MG & 10 x 100MG  TABS Take 3 tablets by mouth 2 (two) times daily for 5 days. Patient GFR is 94. Take nirmatrelvir (150 mg) two tablets twice daily for 5 days and ritonavir (100 mg) one tablet twice daily for 5 days.   nitroGLYCERIN (NITROSTAT) 0.4 MG SL tablet DISSOLVE ONE TABLET UNDER THE TONGUE EVERY 5 MINUTES AS NEEDED FOR CHEST PAIN.&nbsp;&nbsp;DO NOT EXCEED A TOTAL OF 3 DOSES IN 15 MINUTES   oxymetazoline (AFRIN) 0.05 % nasal spray Place 1 spray into both nostrils 2 (two) times daily as needed for congestion.   pantoprazole (PROTONIX) 40 MG tablet TAKE 1 TABLET BY MOUTH ONCE DAILY BEFORE BREAKFAST (Patient not taking: Reported on 07/02/2021)   rosuvastatin (CRESTOR) 5 MG tablet TAKE  ONE TABLET BY MOUTH ON MONDAY, WEDNESDAY, AND FRIDAY   Semaglutide,0.25 or 0.5MG /DOS, (OZEMPIC, 0.25 OR 0.5 MG/DOSE,) 2 MG/1.5ML SOPN Inject 0.5 mg into the skin once a week.   [DISCONTINUED] losartan (COZAAR) 100 MG tablet Take 1 tablet by mouth once daily   [DISCONTINUED] metFORMIN (GLUCOPHAGE) 500 MG tablet Take 1 tablet by mouth once daily with breakfast     Allergies:   Bee venom, Fish allergy, and Ivp dye [iodinated contrast media]   Social History   Tobacco Use   Smoking status: Former    Packs/day: 1.00    Years: 35.00    Pack years: 35.00    Types: Cigarettes    Quit date: 06/03/1999    Years  since quitting: 22.0   Smokeless tobacco: Never  Vaping Use   Vaping Use: Never used  Substance Use Topics   Alcohol use: Yes    Comment: occ   Drug use: No    Comment: hx of use in his colege days     Family Hx: The patient's family history includes Colon cancer in his mother; Coronary artery disease in his maternal grandmother. There is no history of Anesthesia problems, Hypotension, Malignant hyperthermia, or Pseudochol deficiency.  ROS:   Please see the history of present illness.    Review of Systems  Constitutional:  Positive for malaise/fatigue. Negative for fever.  Respiratory:  Negative for cough.   Cardiovascular:  Negative for chest pain.  Gastrointestinal:  Positive for nausea. Negative for diarrhea.  Musculoskeletal:  Positive for myalgias.  Neurological:  Negative for dizziness and headaches.  Psychiatric/Behavioral:  Negative for depression.    All other systems reviewed and are negative.   Labs/Other Tests and Data Reviewed:    Recent Labs: 11/27/2020: ALT 18; Hemoglobin 13.1; Platelets 219 02/27/2021: BUN 10; Creatinine, Ser 0.83; Potassium 3.8; Sodium 140   Recent Lipid Panel Lab Results  Component Value Date/Time   CHOL 141 09/11/2020 08:53 AM   TRIG 134 09/11/2020 08:53 AM   HDL 48 09/11/2020 08:53 AM   CHOLHDL 2.9 09/11/2020 08:53 AM   CHOLHDL  4.3 05/27/2017 08:44 AM   LDLCALC 69 09/11/2020 08:53 AM    Wt Readings from Last 3 Encounters:  07/02/21 161 lb 6.4 oz (73.2 kg)  05/29/21 159 lb (72.1 kg)  04/18/21 159 lb 12.8 oz (72.5 kg)     Exam:    Vital Signs:  BP 125/73    Pulse 94    Temp 99.3 F (37.4 C)    Ht 5\' 7"  (1.702 m)    Wt 159 lb (72.1 kg)    BMI 24.90 kg/m     Physical Exam Constitutional:      General: He is not in acute distress.    Appearance: Normal appearance.  Pulmonary:     Effort: Pulmonary effort is normal. No respiratory distress.  Neurological:     General: No focal deficit present.     Mental Status: He is alert and oriented to person, place, and time. Mental status is at baseline.     Cranial Nerves: No cranial nerve deficit.     Motor: No weakness.  Psychiatric:        Mood and Affect: Mood and affect normal.        Behavior: Behavior normal.        Thought Content: Thought content normal.        Cognition and Memory: Memory normal.        Judgment: Judgment normal.    ASSESSMENT & PLAN:     1. COVID-19  Educated patient if symptoms get worse or if she experiences any SOB, chest pain or pain in her legs to seek immediate emergency care. Call us if you have any questions. Quarantine for 5 days if tested positive and no symptoms or 10 days if tested positive and have symptoms. Wear a mask around other people.   Rapid covid positive at home Return to work on Monday January 2nd. - nirmatrelvir/ritonavir EUA (PAXLOVID) 20 x 150 MG & 10 x 100MG  TABS; Take 3 tablets by mouth 2 (two) times daily for 5 days. Patient GFR is 94. Take nirmatrelvir (150 mg) two tablets twice daily for 5 days and ritonavir (100 mg) one  tablet twice daily for 5 days.  Dispense: 30 tablet; Refill: 0 - melatonin 3 MG TABS tablet; Take 1 tablet (3 mg total) by mouth at bedtime.  Dispense: 14 tablet; Refill: 0  2. Fever, unspecified fever cause Rapid covid is positive and negative for Influenza A/B - POC COVID-19 - POC  Influenza A&B(BINAX/QUICKVUE)  3. Insomnia, unspecified type He is to take melatonin nightly for the next 2-3 weeks - melatonin 3 MG TABS tablet; Take 1 tablet (3 mg total) by mouth at bedtime.  Dispense: 14 tablet; Refill: 0  4. Acute cough Continue with increasing fluid intake  - azithromycin (ZITHROMAX) 250 MG tablet; Take 2 tablets (500 mg) on  Day 1,  followed by 1 tablet (250 mg) once daily on Days 2 through 5.  Dispense: 6 each; Refill: 0  COVID-19 Education: The signs and symptoms of COVID-19 were discussed with the patient and how to seek care for testing (follow up with PCP or arrange E-visit).  The importance of social distancing was discussed today.  Patient Risk:   After full review of this patients clinical status, I feel that they are at least moderate risk at this time.  Time:   Today, I have spent 12.50 minutes/ seconds with the patient with telehealth technology discussing above diagnoses.     Medication Adjustments/Labs and Tests Ordered: Current medicines are reviewed at length with the patient today.  Concerns regarding medicines are outlined above.   Tests Ordered: Orders Placed This Encounter  Procedures   POC COVID-19   POC Influenza A&B(BINAX/QUICKVUE)    Medication Changes: Meds ordered this encounter  Medications   nirmatrelvir/ritonavir EUA (PAXLOVID) 20 x 150 MG & 10 x 100MG  TABS    Sig: Take 3 tablets by mouth 2 (two) times daily for 5 days. Patient GFR is 94. Take nirmatrelvir (150 mg) two tablets twice daily for 5 days and ritonavir (100 mg) one tablet twice daily for 5 days.    Dispense:  30 tablet    Refill:  0   melatonin 3 MG TABS tablet    Sig: Take 1 tablet (3 mg total) by mouth at bedtime.    Dispense:  14 tablet    Refill:  0   azithromycin (ZITHROMAX) 250 MG tablet    Sig: Take 2 tablets (500 mg) on  Day 1,  followed by 1 tablet (250 mg) once daily on Days 2 through 5.    Dispense:  6 each    Refill:  0    Disposition:  Follow  up prn  Signed, [name], FNP

## 2021-06-01 DIAGNOSIS — I10 Essential (primary) hypertension: Secondary | ICD-10-CM

## 2021-06-01 DIAGNOSIS — E119 Type 2 diabetes mellitus without complications: Secondary | ICD-10-CM

## 2021-06-14 ENCOUNTER — Other Ambulatory Visit: Payer: Self-pay | Admitting: Nurse Practitioner

## 2021-06-25 ENCOUNTER — Other Ambulatory Visit: Payer: Self-pay | Admitting: Nurse Practitioner

## 2021-06-25 DIAGNOSIS — I10 Essential (primary) hypertension: Secondary | ICD-10-CM

## 2021-07-02 ENCOUNTER — Ambulatory Visit (INDEPENDENT_AMBULATORY_CARE_PROVIDER_SITE_OTHER): Payer: Medicare HMO | Admitting: Nurse Practitioner

## 2021-07-02 ENCOUNTER — Encounter: Payer: Self-pay | Admitting: Nurse Practitioner

## 2021-07-02 ENCOUNTER — Other Ambulatory Visit: Payer: Self-pay

## 2021-07-02 VITALS — BP 128/74 | HR 85 | Temp 97.9°F | Wt 161.4 lb

## 2021-07-02 DIAGNOSIS — E1169 Type 2 diabetes mellitus with other specified complication: Secondary | ICD-10-CM | POA: Diagnosis not present

## 2021-07-02 DIAGNOSIS — E119 Type 2 diabetes mellitus without complications: Secondary | ICD-10-CM

## 2021-07-02 DIAGNOSIS — Z8616 Personal history of COVID-19: Secondary | ICD-10-CM

## 2021-07-02 DIAGNOSIS — I25119 Atherosclerotic heart disease of native coronary artery with unspecified angina pectoris: Secondary | ICD-10-CM

## 2021-07-02 DIAGNOSIS — I10 Essential (primary) hypertension: Secondary | ICD-10-CM

## 2021-07-02 DIAGNOSIS — I119 Hypertensive heart disease without heart failure: Secondary | ICD-10-CM | POA: Diagnosis not present

## 2021-07-02 NOTE — Patient Instructions (Signed)
Type 2 Diabetes Mellitus, Diagnosis, Adult °Type 2 diabetes (type 2 diabetes mellitus) is a long-term (chronic) disease. It may happen when there is one or both of these problems: °The pancreas does not make enough insulin. °The body does not react in a normal way to insulin that it makes. °Insulin lets sugars go into cells in your body. If you have type 2 diabetes, sugars cannot get into your cells. Sugars build up in the blood. This causes high blood sugar. °What are the causes? °The exact cause of this condition is not known. °What increases the risk? °Having type 2 diabetes in your family. °Being overweight or very overweight. °Not being active. °Your body not reacting in a normal way to the insulin it makes. °Having higher than normal blood sugar over time. °Having a type of diabetes when you were pregnant. °Having a condition that causes small fluid-filled sacs on your ovaries. °What are the signs or symptoms? °At first, you may have no symptoms. You will get symptoms slowly. They may include: °More thirst than normal. °More hunger than normal. °Needing to pee more than normal. °Losing weight without trying. °Feeling tired. °Feeling weak. °Seeing things blurry. °Dark patches on your skin. °How is this treated? °This condition may be treated by a diabetes expert. You may need to: °Follow an eating plan made by a food expert (dietitian). °Get regular exercise. °Find ways to deal with stress. °Check blood sugar as often as told. °Take medicines. °Your doctor will set treatment goals for you. Your blood sugar should be at these levels: °Before meals: 80-130 mg/dL (4.4-7.2 mmol/L). °After meals: below 180 mg/dL (10 mmol/L). °Over the last 2-3 months: less than 7%. °Follow these instructions at home: °Medicines °Take your diabetes medicines or insulin every day. °Take medicines as told to help you prevent other problems caused by this condition. You may need: °Aspirin. °Medicine to lower cholesterol. °Medicine to  control blood pressure. °Questions to ask your doctor °Should I meet with a diabetes educator? °What medicines do I need, and when should I take them? °What will I need to treat my condition at home? °When should I check my blood sugar? °Where can I find a support group? °Who can I call if I have questions? °When is my next doctor visit? °General instructions °Take over-the-counter and prescription medicines only as told by your doctor. °Keep all follow-up visits. °Where to find more information °For help and guidance and more information about diabetes, please go to: °American Diabetes Association (ADA): www.diabetes.org °American Association of Diabetes Care and Education Specialists (ADCES): www.diabeteseducator.org °International Diabetes Federation (IDF): www.idf.org °Contact a doctor if: °Your blood sugar is at or above 240 mg/dL (13.3 mmol/L) for 2 days in a row. °You have been sick for 2 days or more, and you are not getting better. °You have had a fever for 2 days or more, and you are not getting better. °You have any of these problems for more than 6 hours: °You cannot eat or drink. °You feel like you may vomit. °You vomit. °You have watery poop (diarrhea). °Get help right away if: °Your blood sugar is lower than 54 mg/dL (3 mmol/L). °You feel mixed up (confused). °You have trouble thinking clearly. °You have trouble breathing. °You have medium or large ketone levels in your pee. °These symptoms may be an emergency. Get help right away. Call your local emergency services (911 in the [place] °Do not wait to see if the symptoms will go away. °Do not drive yourself   to the hospital. °Summary °Type 2 diabetes is a long-term disease. Your pancreas may not make enough insulin, or your body may not react in a normal way to insulin that it makes. °This condition is treated with an eating plan, lifestyle changes, and medicines. °Your doctor will set treatment goals for you. These will help you keep your blood sugar  in a healthy range. °Keep all follow-up visits. °This information is not intended to replace advice given to you by your health care provider. Make sure you discuss any questions you have with your health care provider. °Document Revised: 08/13/2020 Document Reviewed: 08/13/2020 °Elsevier Patient Education © 2022 [employer] ° °

## 2021-07-02 NOTE — Progress Notes (Signed)
I,Victoria T Hamilton,acting as a Education administrator for [name], FNP.,have documented all relevant documentation on the behalf of [name], FNP,as directed by  [name], FNP while in the presence of [name], [place].  This visit occurred during the SARS-CoV-2 public health emergency.  Safety protocols were in place, including screening questions prior to the visit, additional usage of staff PPE, and extensive cleaning of exam room while observing appropriate contact time as indicated for disinfecting solutions.  Subjective:     Patient ID: Ronald Faulkner , male    DOB: 16-Apr-1951 , 71 y.o.   MRN: [id number]   Chief Complaint  Patient presents with   Diabetes    HPI  Pt presents today for DM & HTN f/u. Admits to eating more potatoes in recent weeks. Had nausea this morning after taking his Ozempic last night.   Diabetes He presents for his follow-up diabetic visit. He has type 2 diabetes mellitus. No MedicAlert identification noted. His disease course has been improving. Pertinent negatives for hypoglycemia include no dizziness or headaches. There are no diabetic associated symptoms. There are no diabetic complications. Risk factors for coronary artery disease include diabetes mellitus, male sex, sedentary lifestyle and hypertension. Current diabetic treatment includes oral agent (dual therapy) (Ozempic and metformin). He is following a diabetic diet. When asked about meal planning, he reported none. He has not had a previous visit with a dietitian. He rarely participates in exercise. (Blood sugar was 131.  ) An ACE inhibitor/angiotensin II receptor blocker is being taken.  Hypertension This is a chronic problem. The current episode started 1 to 4 weeks ago. The problem has been gradually worsening since onset. The problem is uncontrolled. Pertinent negatives include no headaches, peripheral edema or shortness of breath (when lifting a heavy object and walking). There are no associated agents  to hypertension. Risk factors for coronary artery disease include sedentary lifestyle. Past treatments include angiotensin blockers, calcium channel blockers and direct vasodilators. The current treatment provides moderate improvement. There are no compliance problems.  There is no history of angina. There is no history of chronic renal disease.    Past Medical History:  Diagnosis Date   Arthritis    CAD (coronary artery disease)    a. 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 Tarri Zohaib on 05/08/2021 -Pigmented   Unstable angina ([place]) 07/2017     Family History  Problem Relation Age of Onset   Colon cancer Mother    Coronary artery disease Maternal Grandmother    Anesthesia problems Neg Hx    Hypotension Neg Hx    Malignant hyperthermia Neg Hx    Pseudochol deficiency Neg Hx      Current Outpatient Medications:    allopurinol (ZYLOPRIM) 300 MG tablet, Take 1 tablet by mouth once daily, Disp: 90 tablet, Rfl: 0   amLODipine (NORVASC) 10 MG tablet, Take 1 tablet (10 mg total) by mouth daily., Disp: 90 tablet, Rfl: 1   aspirin EC 81 MG tablet, Take 81 mg by mouth daily., Disp: ,  Rfl:    diphenhydrAMINE (BENADRYL) 50 MG tablet, Take 1 tablet (50 mg total) by mouth as needed (once per cath instructions)., Disp: 1 tablet, Rfl: 0   fluticasone (FLONASE) 50 MCG/ACT nasal spray, Place 2 sprays into both nostrils daily., Disp: , Rfl:    glucose blood test strip, 1 each by Other route as needed for other. Use as instructed, Disp: , Rfl:    Insulin Pen Needle 32G X 6 MM MISC, Use with ozempic,  Disp: 50 each, Rfl: 3   isosorbide mononitrate (IMDUR) 60 MG 24 hr tablet, Take 1 tablet (60 mg total) by mouth daily., Disp: 90 tablet, Rfl: 1   linaclotide (LINZESS) 72 MCG capsule, Take 72 mcg by mouth every other day., Disp: , Rfl:    losartan (COZAAR) 100 MG tablet, Take 1 tablet by mouth once daily, Disp: 90 tablet, Rfl: 0   melatonin 3 MG TABS tablet, Take 1 tablet (3 mg total) by mouth at bedtime., Disp: 14 tablet, Rfl: 0   metFORMIN (GLUCOPHAGE) 500 MG tablet, Take 1 tablet by mouth once daily with breakfast, Disp: 90 tablet, Rfl: 0   metoprolol succinate (TOPROL-XL) 25 MG 24 hr tablet, Take 1.5 tablets (37.5 mg total) by mouth daily. TAKE 1 & 1/2 (ONE & ONE-HALF) TABLETS BY MOUTH ONCE DAILY, Disp: 135 tablet, Rfl: 3   nitroGLYCERIN (NITROSTAT) 0.4 MG SL tablet, DISSOLVE ONE TABLET UNDER THE TONGUE EVERY 5 MINUTES AS NEEDED FOR CHEST PAIN.&nbsp;&nbsp;DO NOT EXCEED A TOTAL OF 3 DOSES IN 15 MINUTES, Disp: 25 tablet, Rfl: 0   oxymetazoline (AFRIN) 0.05 % nasal spray, Place 1 spray into both nostrils 2 (two) times daily as needed for congestion., Disp: , Rfl:    rosuvastatin (CRESTOR) 5 MG tablet, TAKE ONE TABLET BY MOUTH ON MONDAY, WEDNESDAY, AND FRIDAY, Disp: 36 tablet, Rfl: 3   Semaglutide,0.25 or 0.5MG/DOS, (OZEMPIC, 0.25 OR 0.5 MG/DOSE,) 2 MG/1.5ML SOPN, Inject 0.5 mg into the skin once a week., Disp: 4.5 mL, Rfl: 3   ezetimibe (ZETIA) 10 MG tablet, Take 1 tablet (10 mg total) by mouth daily. (Patient not taking: Reported on 07/02/2021), Disp: 90 tablet, Rfl: 2   pantoprazole (PROTONIX) 40 MG tablet, TAKE 1 TABLET BY MOUTH ONCE DAILY BEFORE BREAKFAST (Patient not taking: Reported on 07/02/2021), Disp: 90 tablet, Rfl: 0   Allergies  Allergen Reactions   Bee Venom    Fish Allergy Hives and Swelling   Ivp Dye [Iodinated Contrast Media] Swelling     Review of Systems  Constitutional: Negative.   HENT: Negative.    Respiratory:  Negative for shortness of breath (when lifting a heavy object  and walking).   Endocrine: Negative.   Genitourinary: Negative.   Skin: Negative.   Allergic/Immunologic: Negative.   Neurological:  Negative for dizziness and headaches.  Hematological: Negative.     Today's Vitals   07/02/21 0903  BP: 128/74  Pulse: 85  Temp: 97.9 F (36.6 C)  Weight: 161 lb 6.4 oz (73.2 kg)   Body mass index is 25.28 kg/m.  Wt Readings from Last 3 Encounters:  07/02/21 161 lb 6.4 oz (73.2 kg)  05/29/21 159 lb (72.1 kg)  04/18/21 159 lb 12.8 oz (72.5 kg)    Objective:  Physical Exam Vitals reviewed.  Constitutional:      General: He is not in acute distress.    Appearance: Normal appearance.  Cardiovascular:     Rate and Rhythm: Normal rate and regular rhythm.     Pulses: Normal pulses.  Heart sounds: Normal heart sounds. No murmur heard. Pulmonary:     Effort: Pulmonary effort is normal. No respiratory distress.     Breath sounds: Normal breath sounds. No wheezing.  Skin:    General: Skin is warm and dry.  Neurological:     General: No focal deficit present.     Mental Status: He is alert and oriented to person, place, and time.     Cranial Nerves: No cranial nerve deficit.     Motor: No weakness.  Psychiatric:        Mood and Affect: Mood normal.        Behavior: Behavior normal.        Thought Content: Thought content normal.        Judgment: Judgment normal.        Assessment And Plan:     1. Type 2 diabetes mellitus without complication, without long-term current use of insulin (HCC) Comments: HgbA1c is stable, continue Ozempic 0.5 mg and metformin. Encouraged to avoid fried and fatty foods, sugary foods especially when taking Ozempic - Lipid panel - CMP14+EGFR - Hemoglobin A1c  2. Essential hypertension Comments: Blood pressure is well controlled.  - CMP14+EGFR  3. Coronary artery disease involving native coronary artery of native heart with angina pectoris [facility]) Comments: I advised him to call Cardiology to confirm if he  needs to continue Zetia, I advised generally with this diagnosis it is recommended. Last Cardiology note also states continuing  4. History of COVID-19     Patient was given opportunity to ask questions. Patient verbalized understanding of the plan and was able to repeat key elements of the plan. All questions were answered to their satisfaction.  [name], FNP   I, [name], FNP, have reviewed all documentation for this visit. The documentation on 07/02/21 for the exam, diagnosis, procedures, and orders are all accurate and complete.   IF YOU HAVE BEEN REFERRED TO A SPECIALIST, IT MAY TAKE 1-2 WEEKS TO SCHEDULE/PROCESS THE REFERRAL. IF YOU HAVE NOT HEARD FROM US/SPECIALIST IN TWO WEEKS, PLEASE GIVE [place] A CALL AT [phone].   THE PATIENT IS ENCOURAGED TO PRACTICE SOCIAL DISTANCING DUE TO THE COVID-19 PANDEMIC.

## 2021-07-03 LAB — CMP14+EGFR
ALT: 21 IU/L (ref 0–44)
AST: 28 IU/L (ref 0–40)
Albumin/Globulin Ratio: 1.4 (ref 1.2–2.2)
Albumin: 4.5 g/dL (ref 3.7–4.7)
Alkaline Phosphatase: 169 IU/L — ABNORMAL HIGH (ref 44–121)
BUN/Creatinine Ratio: 15 (ref 10–24)
BUN: 13 mg/dL (ref 8–27)
Bilirubin Total: 0.3 mg/dL (ref 0.0–1.2)
CO2: 21 mmol/L (ref 20–29)
Calcium: 9.8 mg/dL (ref 8.6–10.2)
Chloride: 104 mmol/L (ref 96–106)
Creatinine, Ser: 0.87 mg/dL (ref 0.76–1.27)
Globulin, Total: 3.3 g/dL (ref 1.5–4.5)
Glucose: 136 mg/dL — ABNORMAL HIGH (ref 70–99)
Potassium: 3.8 mmol/L (ref 3.5–5.2)
Sodium: 144 mmol/L (ref 134–144)
Total Protein: 7.8 g/dL (ref 6.0–8.5)
eGFR: 92 mL/min/{1.73_m2} (ref >59)

## 2021-07-03 LAB — HEMOGLOBIN A1C
Est. average glucose Bld gHb Est-mCnc: 160 mg/dL
Hgb A1c MFr Bld: 7.2 % — ABNORMAL HIGH (ref 4.8–5.6)

## 2021-07-03 LAB — LIPID PANEL
Chol/HDL Ratio: 2.9 ratio (ref 0.0–5.0)
Cholesterol, Total: 167 mg/dL (ref 100–199)
HDL: 58 mg/dL (ref >39)
LDL Chol Calc (NIH): 91 mg/dL (ref 0–99)
Triglycerides: 102 mg/dL (ref 0–149)
VLDL Cholesterol Cal: 18 mg/dL (ref 5–40)

## 2021-07-10 ENCOUNTER — Telehealth: Payer: Self-pay

## 2021-07-10 NOTE — Chronic Care Management (AMB) (Signed)
Chronic Care Management Pharmacy Assistant   Name: Ronald Faulkner  MRN: [id number] DOB: 1950-06-06  Reason for Encounter: Disease State/ Diabetes  Recent office visits:  07-02-2021 [name], [place]. Glucose= 136, Alkaline= 169.  05-29-2021 [name], FNP. Positive covid test. START Zithromax Take 2 tablets (500 mg) on Day 1, followed by 1 tablet (250 mg) once daily on Days 2 through 5, Melatonin 3 mg nightly, Paxlovid Take 3 tablets by mouth 2 (two) times daily for 5 days. Patient GFR is 94.  Take nirmatrelvir (150 mg) two tablets twice daily for 5 days and ritonavir (100 mg) one tablet twice daily for 5 days.  Recent consult visits:  None  Hospital visits:  None in previous 6 months  Medications: Outpatient Encounter Medications as of 07/10/2021  Medication Sig Note   allopurinol (ZYLOPRIM) 300 MG tablet Take 1 tablet by mouth once daily    amLODipine (NORVASC) 10 MG tablet Take 1 tablet (10 mg total) by mouth daily.    aspirin EC 81 MG tablet Take 81 mg by mouth daily.    diphenhydrAMINE (BENADRYL) 50 MG tablet Take 1 tablet (50 mg total) by mouth as needed (once per cath instructions).    ezetimibe (ZETIA) 10 MG tablet Take 1 tablet (10 mg total) by mouth daily. (Patient not taking: Reported on 07/02/2021)    fluticasone (FLONASE) 50 MCG/ACT nasal spray Place 2 sprays into both nostrils daily.    glucose blood test strip 1 each by Other route as needed for other. Use as instructed    Insulin Pen Needle 32G X 6 MM MISC Use with ozempic    isosorbide mononitrate (IMDUR) 60 MG 24 hr tablet Take 1 tablet (60 mg total) by mouth daily. 05/16/2021: Patient taking 1 and 1/2 tablets (90 mg) daily. Increased by the cardiologist.    linaclotide (LINZESS) 72 MCG capsule Take 72 mcg by mouth every other day.    losartan (COZAAR) 100 MG tablet Take 1 tablet by mouth once daily    melatonin 3 MG TABS tablet Take 1 tablet (3 mg total) by mouth at bedtime.    metFORMIN (GLUCOPHAGE) 500 MG  tablet Take 1 tablet by mouth once daily with breakfast    metoprolol succinate (TOPROL-XL) 25 MG 24 hr tablet Take 1.5 tablets (37.5 mg total) by mouth daily. TAKE 1 & 1/2 (ONE & ONE-HALF) TABLETS BY MOUTH ONCE DAILY    nitroGLYCERIN (NITROSTAT) 0.4 MG SL tablet DISSOLVE ONE TABLET UNDER THE TONGUE EVERY 5 MINUTES AS NEEDED FOR CHEST PAIN.&nbsp;&nbsp;DO NOT EXCEED A TOTAL OF 3 DOSES IN 15 MINUTES    oxymetazoline (AFRIN) 0.05 % nasal spray Place 1 spray into both nostrils 2 (two) times daily as needed for congestion.    pantoprazole (PROTONIX) 40 MG tablet TAKE 1 TABLET BY MOUTH ONCE DAILY BEFORE BREAKFAST (Patient not taking: Reported on 07/02/2021)    rosuvastatin (CRESTOR) 5 MG tablet TAKE ONE TABLET BY MOUTH ON MONDAY, WEDNESDAY, AND FRIDAY    Semaglutide,0.25 or 0.5MG /DOS, (OZEMPIC, 0.25 OR 0.5 MG/DOSE,) 2 MG/1.5ML SOPN Inject 0.5 mg into the skin once a week.    No facility-administered encounter medications on file as of 07/10/2021.   Recent Relevant Labs: Lab Results  Component Value Date/Time   HGBA1C 7.2 (H) 07/02/2021 09:48 AM   HGBA1C 6.6 (H) 02/27/2021 11:34 AM   MICROALBUR 150 09/05/2020 03:51 PM    Kidney Function Lab Results  Component Value Date/Time   CREATININE 0.87 07/02/2021 09:48 AM   CREATININE 0.83  02/27/2021 11:34 AM   GFRNONAA 81 06/05/2020 03:35 PM   GFRAA 93 06/05/2020 03:35 PM    Current antihyperglycemic regimen:  Ozempic 0.5 mg weekly  What recent interventions/DTPs have been made to improve glycemic control:  Educated on A1c and blood sugar goals; -Counseled to check feet daily and get yearly eye exams -Recommended to continue current medication  Have there been any recent hospitalizations or ED visits since last visit with CPP? No  Patient denies hypoglycemic symptoms  Patient denies hyperglycemic symptoms  How often are you checking your blood sugar? once daily  What are your blood sugars ranging?  Fasting: 140, 136, 123 Before meals:  None After meals: None Bedtime: None  During the week, how often does your blood glucose drop below 70? Never  Are you checking your feet daily/regularly? Patient stated daily  Adherence Review: Is the patient currently on a STATIN medication? Yes Is the patient currently on ACE/ARB medication? Yes Does the patient have >5 day gap between last estimated fill dates? No  Care Gaps: Covid booster overdue AWV 09-12-2021  Star Rating Drugs: Ozempic 0.5 mg- Patient assistance Metformin 500 mg- Last filled 06-17-2021 90 [facility] Losartan 100 mg- Last filled 06-25-2021 90 [facility] Rosuvastatin 5 mg- Last filled 06-04-2021 84 DS [place] Clinical Pharmacist Assistant [phone]

## 2021-08-06 ENCOUNTER — Telehealth: Payer: Self-pay | Admitting: Cardiology

## 2021-08-06 NOTE — Telephone Encounter (Signed)
Patient c/o chest pain off/on, nothing constant.  Has not taken any of his Nitroglycerin.  Did notice feet & leg swelling last evening.  SOB w/ exertion is new since last OV.  Does mention that he has some nausea off/on but this is not new.  BP typically running 120's/70 & HR 90-100.  States he is currently at work ([employer]).   ? ?Appointment given for tomorrow at 11:20 am with Dr. Harl Bowie in [place] office.   ?

## 2021-08-06 NOTE — Telephone Encounter (Signed)
Pt c/o of Chest Pain: STAT if CP now or developed within 24 hours ? ?1. Are you having CP right now? no ? ?2. Are you experiencing any other symptoms (ex. SOB, nausea, vomiting, sweating)? Nausea, headache, SOB ? ?3. How long have you been experiencing CP? For a while  ? ?4. Is your CP continuous or coming and going? Comes and goes  ? ?5. Have you taken Nitroglycerin? no ?? ? ?Pt is here in the office stating that he's been having chest pain on and off for a while and he's been having shortness of breath for about 2-3 weeks.  ? ?Last night he noticed that he's having bilateral lower extremity swelling.  ? ?States he's not having active chest pain and it's not constant ? ?I have asked pt to have a seat in the lobby.  ? ? ?  ?

## 2021-08-07 ENCOUNTER — Ambulatory Visit: Payer: Medicare HMO | Admitting: Cardiology

## 2021-08-07 ENCOUNTER — Other Ambulatory Visit: Payer: Self-pay

## 2021-08-07 ENCOUNTER — Encounter: Payer: Self-pay | Admitting: Cardiology

## 2021-08-07 VITALS — BP 120/76 | HR 80 | Ht 67.0 in | Wt 163.6 lb

## 2021-08-07 DIAGNOSIS — I25119 Atherosclerotic heart disease of native coronary artery with unspecified angina pectoris: Secondary | ICD-10-CM

## 2021-08-07 MED ORDER — METOPROLOL SUCCINATE ER 50 MG PO TB24: 50.0000 mg | ORAL_TABLET | Freq: Every day | ORAL | 1 refills | Status: DC

## 2021-08-07 NOTE — Patient Instructions (Signed)
Medication Instructions:  ?Your physician has recommended you make the following change in your medication:  ?Metoprolol has been increased to 50 mg once a day  ?Continue all other medications as directed ? ?Labwork: ?none ? ?Testing/Procedures: ?none ? ?Follow-Up: ?Your physician recommends that you schedule a follow-up appointment in: 3 months ? ?Any Other Special Instructions Will Be Listed Below (If Applicable).  ?Please call the office in 1 week with an update on chest pain and shortness of breath. ? ?If you need a refill on your cardiac medications before your next appointment, please call your pharmacy. ? ?

## 2021-08-07 NOTE — Progress Notes (Signed)
Clinical Summary Mr. Iten is a 71 y.o.male former patient of Dr Bronson Ing, this is our first visit together.  1.CAD -history of DES x 2  to LAD in 07/2017 - 10/2019 cath: patent stents LAD, high grade diffuse disease small ramus, normal LCX, OM 50%, RCA 30% distal. LVEDP 20 - 07/2020 echo: LVEF 60-65%, no WMAs, grade I dd, normal RV function.  - 03/2021 nuclear stress: no ischemia - headaches on imdur '90mg'$  though would be willing to retry.    2.Hyperlipidemia - difficultly tolerating statin, no crestor 3 days a week and zetia   3. HTN - compliant with meds  4. SOB - ongoing 3-4 months - no coughing, no wheezing - DOE walking 30 to 40 feet. Some associated chest pain. Squeezing like feeling left sided, can be mild to moderate. Pain lasts a few minutes. Symptoms improve with rest. - increasing in frequency.  - occasional LE edema   Past Medical History:  Diagnosis Date   Arthritis    CAD (coronary artery disease)    a. 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 Tarri Robie on 05/08/2021 -Pigmented   Unstable angina ([place]) 07/2017     Allergies  Allergen Reactions   Bee Venom    Fish Allergy Hives and Swelling   Ivp Dye [Iodinated Contrast Media] Swelling     Current Outpatient Medications  Medication Sig Dispense Refill   allopurinol (ZYLOPRIM) 300 MG tablet Take 1 tablet by mouth once daily 90 tablet 0   amLODipine (NORVASC) 10 MG tablet Take 1 tablet (10 mg total) by mouth daily. 90 tablet 1   aspirin EC 81 MG tablet Take 81 mg by mouth daily.      diphenhydrAMINE (BENADRYL) 50 MG tablet Take 1 tablet (50 mg total) by mouth as needed (once per cath instructions). 1 tablet 0   ezetimibe (ZETIA) 10 MG tablet Take 1 tablet (10 mg total) by mouth daily. (Patient not taking: Reported on 07/02/2021) 90 tablet 2   fluticasone (FLONASE) 50 MCG/ACT nasal spray Place 2 sprays into both nostrils daily.     glucose blood test strip 1 each by Other route as needed for other. Use as instructed     Insulin Pen Needle 32G X 6 MM MISC Use with ozempic 50 each 3   isosorbide mononitrate (IMDUR) 60 MG 24 hr tablet Take 1 tablet (60 mg total) by mouth daily. 90 tablet 1   linaclotide (LINZESS) 72 MCG capsule Take 72 mcg by mouth every other day.     losartan (COZAAR) 100 MG tablet Take 1 tablet by mouth once daily 90 tablet 0   melatonin 3 MG TABS tablet Take 1 tablet (3 mg total) by mouth at bedtime. 14 tablet 0   metFORMIN (GLUCOPHAGE) 500 MG tablet Take 1 tablet by mouth once daily with breakfast 90 tablet 0   metoprolol succinate (TOPROL-XL) 25 MG 24 hr tablet Take 1.5 tablets (37.5 mg total) by mouth daily. TAKE 1 & 1/2 (ONE & ONE-HALF) TABLETS BY  MOUTH ONCE DAILY 135 tablet 3   nitroGLYCERIN (NITROSTAT) 0.4 MG SL tablet DISSOLVE ONE TABLET UNDER THE TONGUE EVERY 5 MINUTES AS NEEDED FOR CHEST PAIN.&nbsp;&nbsp;DO NOT EXCEED A TOTAL OF 3 DOSES IN 15 MINUTES 25 tablet 0   oxymetazoline (AFRIN) 0.05 % nasal spray Place 1 spray into both nostrils 2 (two) times daily as needed for congestion.     pantoprazole (PROTONIX) 40 MG tablet TAKE 1 TABLET BY MOUTH ONCE DAILY BEFORE BREAKFAST (Patient not taking: Reported on 07/02/2021) 90 tablet 0   rosuvastatin (CRESTOR) 5 MG tablet TAKE ONE TABLET BY MOUTH ON MONDAY, WEDNESDAY, AND FRIDAY 36 tablet 3   Semaglutide,0.25 or 0.'5MG'$ /DOS, (OZEMPIC, 0.25 OR 0.5 MG/DOSE,) 2 MG/1.5ML SOPN Inject 0.5 mg into the skin once a week. 4.5 mL 3   No current facility-administered medications for this visit.     Past Surgical History:   Procedure Laterality Date   ABDOMINAL SURGERY     APPENDECTOMY     BREAST SURGERY     benign lump at lt side   COLON SURGERY     COLONOSCOPY  03/14/2011   Procedure: COLONOSCOPY;  Surgeon: [name], MD;  [facility];  Service: Endoscopy;  Laterality: N/A;  1:00   COLONOSCOPY N/A 11/15/2015   Procedure: COLONOSCOPY;  Surgeon: [name], MD;  [facility];  Service: Endoscopy;  Laterality: N/A;  11:15   COLONOSCOPY WITH PROPOFOL N/A 07/15/2019   Procedure: COLONOSCOPY WITH PROPOFOL;  Surgeon: [name], MD;  [facility];  Service: Endoscopy;  Laterality: N/A;  1235   CORONARY STENT INTERVENTION N/A 07/31/2017   Procedure: CORONARY STENT INTERVENTION;  Surgeon: [name], MD;  Location: [place] [facility];  Service: Cardiovascular;  Laterality: N/A;   LEFT HEART CATH AND CORONARY ANGIOGRAPHY N/A 07/31/2017   Procedure: LEFT HEART CATH AND CORONARY ANGIOGRAPHY;  Surgeon: [name], MD;  Location: [place] [facility];  Service: Cardiovascular;  Laterality: N/A;   LEFT HEART CATH AND CORONARY ANGIOGRAPHY N/A 10/28/2019   Procedure: LEFT HEART CATH AND CORONARY ANGIOGRAPHY;  Surgeon: Belva Crome, MD;  Location: [place] [facility];  Service: Cardiovascular;  Laterality: N/A;   PENILE PROSTHESIS IMPLANT     2016    prostate cancer     2015. prostectomy     Allergies  Allergen Reactions   Bee Venom    Fish Allergy Hives and Swelling   Ivp Dye [Iodinated Contrast Media] Swelling      Family History  Problem Relation Age of Onset   Colon cancer Mother    Coronary artery disease Maternal Grandmother    Anesthesia problems Neg Hx    Hypotension Neg Hx    Malignant hyperthermia Neg Hx    Pseudochol deficiency Neg Hx      Social History Mr. Bring reports that he quit smoking about 22 years ago. His smoking use included cigarettes. He has a 35.00 pack-year smoking history. He has never used smokeless  tobacco. Mr. Beutler reports current alcohol use.   Review of Systems CONSTITUTIONAL: No weight loss, fever, chills, weakness or fatigue.  HEENT: Eyes: No visual loss, blurred vision, double vision or yellow sclerae.No hearing loss, sneezing, congestion, runny nose or sore throat.  SKIN: No rash or itching.  CARDIOVASCULAR: per hpi RESPIRATORY: No shortness of breath, cough or sputum.  GASTROINTESTINAL: No anorexia, nausea, vomiting or diarrhea. No abdominal pain or blood.  GENITOURINARY: No burning on urination, no polyuria NEUROLOGICAL:  No headache, dizziness, syncope, paralysis, ataxia, numbness or tingling in the extremities. No change in bowel or bladder control.  MUSCULOSKELETAL: No muscle, back pain, joint pain or stiffness.  LYMPHATICS: No enlarged nodes. No history of splenectomy.  PSYCHIATRIC: No history of depression or anxiety.  ENDOCRINOLOGIC: No reports of sweating, cold or heat intolerance. No polyuria or polydipsia.  Marland Kitchen   Physical Examination Today's Vitals   08/07/21 1109  BP: 120/76  Pulse: 80  SpO2: 98%  Weight: 163 lb 9.6 oz (74.2 kg)  Height: '5\' 7"'$  (1.702 m)   Body mass index is 25.62 kg/m.  Gen: resting comfortably, no acute distress HEENT: no scleral icterus, pupils equal round and reactive, no palptable cervical adenopathy,  CV: RRR, no m/rg, no jvd Resp: Clear to auscultation bilaterally GI: abdomen is soft, non-tender, non-distended, normal bowel sounds, no hepatosplenomegaly MSK: extremities are warm, no edema.  Skin: warm, no rash Neuro:  no focal deficits Psych: appropriate affect   Diagnostic Studies  07/2017 cath Prox RCA to Mid RCA lesion is 20% stenosed. Dist RCA lesion is 50% stenosed. Dist Cx lesion is 60% stenosed. Ost Cx to Prox Cx lesion is 20% stenosed. Ost 1st Mrg to 1st Mrg lesion is 70% stenosed. Ost LAD to Prox LAD lesion is 20% stenosed. Mid LAD-1 lesion is 80% stenosed. Mid LAD-2 lesion is 95% stenosed. Dist LAD lesion is  70% stenosed. A drug-eluting stent was successfully placed using a STENT SIERRA 2.25 X 12 MM. Post intervention, there is a 0% residual stenosis. A drug-eluting stent was successfully placed using a STENT SIERRA 2.50 X 18 MM. Post intervention, there is a 0% residual stenosis.   1. Severe stenosis in the mid and distal LAD. Successful PTCA/DES x 1 distal LAD and PTCA/DES x 1 mid LAD.  2. Modeately severe stenosis in small caliber intermediate Rylin Saez.  3. Moderate stenosis in the distal RCA   Post cath Recommendations: Will continue DAPT with ASA and Plavix for at least one year. Will continue beta blocker and statin. I would recommend medical management of the disease in the small caliber intermediate Margarie Mcguirt.   10/2019 cath Patent left main Patent LAD mid and distal stent. High-grade diffuse obstructive disease in a branching relatively small ramus intermedius.  Slight progression compared to 2019. Widely patent circumflex with first obtuse marginal containing a 50% proximal narrowing Widely patent dominant right coronary with 30 to 40% narrowing distally. Normal LV function with EF 55%.  LVEDP 20 mmHg.   RECOMMENDATIONS:   Continue current therapy Further medication adjustments and management per Dr.,Konesweran   07/2020 echo 1. Left ventricular ejection fraction, by estimation, is 60 to 65%. The  left ventricle has normal function. The left ventricle has no regional  wall motion abnormalities. Left ventricular diastolic parameters are  consistent with Grade I diastolic  dysfunction (impaired relaxation).   2. Right ventricular systolic function is normal. The right ventricular  size is normal. Tricuspid regurgitation signal is inadequate for assessing  PA pressure.   3. The mitral valve is grossly normal. Trivial mitral valve  regurgitation.   4. The aortic valve is tricuspid. There is mild calcification of the  aortic valve. Aortic valve regurgitation is not visualized.   5.  The inferior vena cava is normal in size with greater than 50%  respiratory variability, suggesting right atrial pressure of 3 mmHg.   Comparison(s): Echocardiogram done 10/13/18 showed an EF of 60-65%   03/2021 nuclear stress:  The study is normal. There are no  perfusion defects consistent with prior infarct or current ischemia. The study is low risk.   No ST deviation was noted.   There is a moderate size moderate intensity inferior defect that is most intense in the resting images with normal wall motion. Findings consistent with diaphragmatic attenuation and artifact due to adjacet gut tracer uptake.   Left ventricular function is normal. Nuclear stress EF: 67 %. The left ventricular ejection fraction is hyperdynamic (>65%). End diastolic cavity size is normal.   Assessment and Plan   1.CAD with unspecified angina - recent DOE and exertional chest pains - 2021 cath with stable disease including residual ramus disease, 03/2021 nuclear stress no ischemia - suspect symptoms related to residual ramus disease. Work to further optimize antianginal therapy. Increase toprol to '50mg'$  daily.  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

## 2021-08-22 ENCOUNTER — Ambulatory Visit: Payer: Medicare HMO

## 2021-09-09 ENCOUNTER — Telehealth: Payer: Self-pay

## 2021-09-09 NOTE — Chronic Care Management (AMB) (Addendum)
? ? ?Chronic Care Management ?Pharmacy Assistant  ? ?Name: Ronald Faulkner  MRN: [id number] DOB: 01-24-1951 ? ?Reason for Encounter: Disease State/ Hypertension ? ?Recent office visits:  ?None ? ?Recent consult visits:  ?08-07-2021 Ronald Lenis, MD (Cardiology). EKG completed. CHANGE metoprolol 37.5 mg take 1.5 tablets daily TO 50 mg take 1.5 tablets daily. ? ?Hospital visits:  ?None in previous 6 months ? ?Medications: ?Outpatient Encounter Medications as of 09/09/2021  ?Medication Sig Note  ? allopurinol (ZYLOPRIM) 300 MG tablet Take 1 tablet by mouth once daily   ? amLODipine (NORVASC) 10 MG tablet Take 1 tablet (10 mg total) by mouth daily.   ? aspirin EC 81 MG tablet Take 81 mg by mouth daily.   ? diphenhydrAMINE (BENADRYL) 50 MG tablet Take 1 tablet (50 mg total) by mouth as needed (once per cath instructions).   ? ezetimibe (ZETIA) 10 MG tablet Take 1 tablet (10 mg total) by mouth daily.   ? fluticasone (FLONASE) 50 MCG/ACT nasal spray Place 2 sprays into both nostrils daily.   ? glucose blood test strip 1 each by Other route as needed for other. Use as instructed   ? Insulin Pen Needle 32G X 6 MM MISC Use with ozempic   ? isosorbide mononitrate (IMDUR) 60 MG 24 hr tablet Take 1 tablet (60 mg total) by mouth daily. 08/07/2021: Decreased to 60 mg daily one month ago on his own ?  ? linaclotide (LINZESS) 72 MCG capsule Take 72 mcg by mouth every other day.   ? losartan (COZAAR) 100 MG tablet Take 1 tablet by mouth once daily   ? melatonin 3 MG TABS tablet Take 1 tablet (3 mg total) by mouth at bedtime.   ? metFORMIN (GLUCOPHAGE) 500 MG tablet Take 1 tablet by mouth once daily with breakfast   ? metoprolol succinate (TOPROL-XL) 50 MG 24 hr tablet Take 1 tablet (50 mg total) by mouth daily.   ? nitroGLYCERIN (NITROSTAT) 0.4 MG SL tablet DISSOLVE ONE TABLET UNDER THE TONGUE EVERY 5 MINUTES AS NEEDED FOR CHEST PAIN.&nbsp;&nbsp;DO NOT EXCEED A TOTAL OF 3 DOSES IN 15 MINUTES   ? oxymetazoline (AFRIN) 0.05 % nasal  spray Place 1 spray into both nostrils 2 (two) times daily as needed for congestion.   ? pantoprazole (PROTONIX) 40 MG tablet TAKE 1 TABLET BY MOUTH ONCE DAILY BEFORE BREAKFAST (Patient not taking: Reported on 07/02/2021) 08/07/2021: Stopped on his own 3-4 months ago-want to get off of some medicines  ? rosuvastatin (CRESTOR) 5 MG tablet TAKE ONE TABLET BY MOUTH ON MONDAY, WEDNESDAY, AND FRIDAY   ? Semaglutide,0.25 or 0.'5MG'$ /DOS, (OZEMPIC, 0.25 OR 0.5 MG/DOSE,) 2 MG/1.5ML SOPN Inject 0.5 mg into the skin once a week.   ? ?No facility-administered encounter medications on file as of 09/09/2021.  ?Reviewed chart prior to disease state call. Spoke with patient regarding BP ? ?Recent Office Vitals: ?BP Readings from Last 3 Encounters:  ?08/07/21 120/76  ?07/02/21 128/74  ?05/29/21 125/73  ? ?Pulse Readings from Last 3 Encounters:  ?08/07/21 80  ?07/02/21 85  ?05/29/21 94  ?  ?Wt Readings from Last 3 Encounters:  ?08/07/21 163 lb 9.6 oz (74.2 kg)  ?07/02/21 161 lb 6.4 oz (73.2 kg)  ?05/29/21 159 lb (72.1 kg)  ?  ? ?Kidney Function ?Lab Results  ?Component Value Date/Time  ? CREATININE 0.87 07/02/2021 09:48 AM  ? CREATININE 0.83 02/27/2021 11:34 AM  ? GFRNONAA 81 06/05/2020 03:35 PM  ? GFRAA 93 06/05/2020 03:35 PM  ? ? ? ?  Latest Ref Rng & Units 07/02/2021  ?  9:48 AM 02/27/2021  ? 11:34 AM 11/27/2020  ? 10:34 AM  ?BMP  ?Glucose 70 - 99 mg/dL 136   101   107    ?BUN 8 - 27 mg/dL '13   10   13    '$ ?Creatinine 0.76 - 1.27 mg/dL 0.87   0.83   1.09    ?BUN/Creat Ratio 10 - '24 15   12   12    '$ ?Sodium 134 - 144 mmol/L 144   140   142    ?Potassium 3.5 - 5.2 mmol/L 3.8   3.8   3.5    ?Chloride 96 - 106 mmol/L 104   101   102    ?CO2 20 - 29 mmol/L '21   21   19    '$ ?Calcium 8.6 - 10.2 mg/dL 9.8   9.6   9.4    ? ? ?Current antihypertensive regimen:  ?Losartan 100 mg tablet once per day  ?Metoprolol Succinate 25 mg tablet once per day ?Isosorbide 60 mg tablet once per day ?Amlodipine 10 mg tablet once per day ? ?How often are you checking  your Blood Pressure? 1-2x per week ? ?Current home BP readings: 138/74, 128/70 ? ?What recent interventions/DTPs have been made by any provider to improve Blood Pressure control since last CPP Visit:  ?-Educated on BP goals and benefits of medications for prevention of heart attack, stroke and kidney damage; ?Daily salt intake goal < 2300 mg; ?Proper BP monitoring technique; ? ?Any recent hospitalizations or ED visits since last visit with CPP? No ? ?What diet changes have been made to improve Blood Pressure Control?  ?Patient stated he has been limiting his salt intake. ? ?What exercise is being done to improve your Blood Pressure Control?  ?Patient stated he walks daily ? ?Adherence Review: ?Is the patient currently on ACE/ARB medication? Yes ?Does the patient have >5 day gap between last estimated fill dates? No ? ?NOTES: ?Patient states blood pressure has being normal. Patient stated he received a samples of 0.5 mg Ozempic because he hasn't received shipment from [name]. Contacted [facility] and was on hold for 10 minutes. Requested a call back on Monday. ? ?09-17-2021: Never received a call back from [facility] so tried contacting again.Ozempic 0.5 mg is in the process of being shipped. A voucher was provided. Submitting a reorder form for dose change since [facility] [employer] increased ozempic to 1 mg weekly. [name] a refill request for 0.5 mg with 2 refills to [facility] until shipment comes. ? ? ?BIN [id number] ?PCN [id number] ?GP [id number] ?ID [id number] ? ?09-18-2021: Contacted [facility] with voucher info. Informed patient Ozempic shipment was delivered today and to pick up from office and [facility] when ready. Patient is to continue on 0.5 mg until [name] gives the OK to increase. ? ? ?Care Gaps: ?Covid booster overdue ?Yearly ophthalmology overdue ? ?Star Rating Drugs: ?Ozempic 0.5 mg- Patient assistance ?Metformin 500 mg- Last filled 06-17-2021 90 DS [facility] ?Losartan 100 mg- Last filled 06-25-2021 90 DS [facility] ?Rosuvastatin 5 mg-  Last filled 08-22-2021 84 DS [facility] ? ?[name] CMA ?Clinical Pharmacist Assistant ?[phone] ? ?

## 2021-09-10 ENCOUNTER — Telehealth: Payer: Self-pay | Admitting: Cardiology

## 2021-09-10 MED ORDER — ISOSORBIDE MONONITRATE ER 60 MG PO TB24: 90.0000 mg | ORAL_TABLET | Freq: Every day | ORAL | 1 refills | Status: DC

## 2021-09-10 NOTE — Telephone Encounter (Signed)
Pt is needing a new Rx sent to [facility] for his isosorbide mononitrate (IMDUR) 60 MG 24 hr tablet [[id number]]  he had a dose change and has ran out  ?

## 2021-09-12 ENCOUNTER — Ambulatory Visit (INDEPENDENT_AMBULATORY_CARE_PROVIDER_SITE_OTHER): Payer: Medicare HMO | Admitting: Nurse Practitioner

## 2021-09-12 ENCOUNTER — Encounter: Payer: Self-pay | Admitting: Nurse Practitioner

## 2021-09-12 ENCOUNTER — Ambulatory Visit: Payer: Medicare HMO

## 2021-09-12 VITALS — BP 138/88 | HR 91 | Temp 98.3°F | Ht 67.0 in | Wt 161.2 lb

## 2021-09-12 DIAGNOSIS — I25119 Atherosclerotic heart disease of native coronary artery with unspecified angina pectoris: Secondary | ICD-10-CM | POA: Diagnosis not present

## 2021-09-12 DIAGNOSIS — E1159 Type 2 diabetes mellitus with other circulatory complications: Secondary | ICD-10-CM

## 2021-09-12 DIAGNOSIS — I119 Hypertensive heart disease without heart failure: Secondary | ICD-10-CM

## 2021-09-12 DIAGNOSIS — E119 Type 2 diabetes mellitus without complications: Secondary | ICD-10-CM

## 2021-09-12 DIAGNOSIS — I10 Essential (primary) hypertension: Secondary | ICD-10-CM

## 2021-09-12 MED ORDER — METFORMIN HCL 500 MG PO TABS: 500.0000 mg | ORAL_TABLET | Freq: Every day | ORAL | 1 refills | Status: DC

## 2021-09-12 MED ORDER — OZEMPIC (1 MG/DOSE) 4 MG/3ML ~~LOC~~ SOPN: 1.0000 mg | PEN_INJECTOR | SUBCUTANEOUS | 1 refills | Status: DC

## 2021-09-12 NOTE — Patient Instructions (Signed)

## 2021-09-12 NOTE — Progress Notes (Signed)
?Industrial/product designer as a Education administrator for [employer], FNP.,have documented all relevant documentation on the behalf of [name], FNP,as directed by  [name], FNP while in the presence of [name], [place]. ? ?This visit occurred during the SARS-CoV-2 public health emergency.  Safety protocols were in place, including screening questions prior to the visit, additional usage of staff PPE, and extensive cleaning of exam room while observing appropriate contact time as indicated for disinfecting solutions. ? ?Subjective:  ?  ? Patient ID: Ronald Faulkner , male    DOB: 1950/12/16 , 71 y.o.   MRN: [id number] ? ? ?Chief Complaint  ?Patient presents with  ? Diabetes  ? ? ?HPI ? ?Pt presents today for DM & HTN f/u.  ? ?Diabetes ?He presents for his follow-up diabetic visit. He has type 2 diabetes mellitus. No MedicAlert identification noted. His disease course has been improving. Pertinent negatives for hypoglycemia include no dizziness or headaches. There are no diabetic associated symptoms. There are no diabetic complications. Risk factors for coronary artery disease include diabetes mellitus, male sex, sedentary lifestyle and hypertension. Current diabetic treatment includes oral agent (dual therapy) (Ozempic and metformin). He is following a diabetic diet. When asked about meal planning, he reported none. He has not had a previous visit with a dietitian. He rarely participates in exercise. (Blood sugar was 131.  ) An ACE inhibitor/angiotensin II receptor blocker is being taken.  ?Hypertension ?This is a chronic problem. The current episode started 1 to 4 weeks ago. The problem has been gradually worsening since onset. The problem is uncontrolled. Pertinent negatives include no headaches, peripheral edema or shortness of breath (when lifting a heavy object and walking). There are no associated agents to hypertension. Risk factors for coronary artery disease include sedentary lifestyle. Past treatments include  angiotensin blockers, calcium channel blockers and direct vasodilators. The current treatment provides moderate improvement. There are no compliance problems.  There is no history of angina. There is no history of chronic renal disease.   ? ?Past Medical History:  ?Diagnosis Date  ? Arthritis   ? CAD (coronary artery disease)   ? a. s/p DES to mid and distal LAD in 07/2017 with medical management recommended of intermediate branch  ? Cancer [facility])   ? prostate  ? CHF (congestive heart failure) ([place])   ? Colon polyps   ? Diabetes mellitus without complication ([place])   ? Diverticulitis   ? History of kidney stones   ? Hypertension   ? IBS (irritable bowel syndrome)   ? 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 Seborrheic keratosis 05/20/2021  ? Dx by Dr. Tarri Jaymari on 05/08/2021 -Pigmented  ? Unstable angina ([place]) 07/2017  ?  ? ?Family History  ?Problem Relation Age of Onset  ? Colon cancer Mother   ? Coronary artery disease Maternal Grandmother   ? Anesthesia problems Neg Hx   ? Hypotension Neg Hx   ? Malignant hyperthermia Neg Hx   ? Pseudochol deficiency Neg Hx   ? ? ? ?Current Outpatient Medications:  ?  allopurinol (ZYLOPRIM) 300 MG tablet, Take 1 tablet by mouth once daily, Disp: 90 tablet, Rfl: 0 ?  amLODipine (NORVASC) 10 MG tablet, Take 1 tablet (10 mg total) by mouth daily., Disp: 90 tablet, Rfl: 1 ?  aspirin EC 81 MG tablet, Take 81 mg by mouth daily., Disp: , Rfl:  ?  diphenhydrAMINE (BENADRYL) 50 MG tablet, Take 1 tablet (50 mg total) by mouth as  needed (once per cath instructions)., Disp: 1 tablet, Rfl: 0 ?  ezetimibe (ZETIA) 10 MG tablet, Take 1 tablet (10 mg total) by mouth daily., Disp: 90 tablet, Rfl: 2 ?  fluticasone (FLONASE) 50 MCG/ACT nasal spray, Place 2 sprays into both nostrils daily., Disp: , Rfl:  ?  glucose blood test strip, 1 each by Other route as needed for other. Use as instructed, Disp: , Rfl:  ?  Insulin Pen Needle 32G X 6 MM MISC, Use with ozempic, Disp: 50  each, Rfl: 3 ?  isosorbide mononitrate (IMDUR) 60 MG 24 hr tablet, Take 1.5 tablets (90 mg total) by mouth daily., Disp: 135 tablet, Rfl: 1 ?  linaclotide (LINZESS) 72 MCG capsule, Take 72 mcg by mouth every other day., Disp: , Rfl:  ?  melatonin 3 MG TABS tablet, Take 1 tablet (3 mg total) by mouth at bedtime., Disp: 14 tablet, Rfl: 0 ?  metoprolol succinate (TOPROL-XL) 50 MG 24 hr tablet, Take 1 tablet (50 mg total) by mouth daily., Disp: 90 tablet, Rfl: 1 ?  nitroGLYCERIN (NITROSTAT) 0.4 MG SL tablet, DISSOLVE ONE TABLET UNDER THE TONGUE EVERY 5 MINUTES AS NEEDED FOR CHEST PAIN.&nbsp;&nbsp;DO NOT EXCEED A TOTAL OF 3 DOSES IN 15 MINUTES, Disp: 25 tablet, Rfl: 0 ?  oxymetazoline (AFRIN) 0.05 % nasal spray, Place 1 spray into both nostrils 2 (two) times daily as needed for congestion., Disp: , Rfl:  ?  rosuvastatin (CRESTOR) 5 MG tablet, TAKE ONE TABLET BY MOUTH ON MONDAY, WEDNESDAY, AND FRIDAY, Disp: 36 tablet, Rfl: 3 ?  Semaglutide, 1 MG/DOSE, (OZEMPIC, 1 MG/DOSE,) 4 MG/3ML SOPN, Inject 1 mg into the skin once a week., Disp: 9 mL, Rfl: 1 ?  losartan (COZAAR) 100 MG tablet, Take 1 tablet (100 mg total) by mouth daily., Disp: 90 tablet, Rfl: 0 ?  metFORMIN (GLUCOPHAGE) 500 MG tablet, Take 1 tablet (500 mg total) by mouth daily with breakfast., Disp: 90 tablet, Rfl: 1  ? ?Allergies  ?Allergen Reactions  ? Bee Venom   ? Fish Allergy Hives and Swelling  ? Ivp Dye [Iodinated Contrast Media] Swelling  ?  ? ?Review of Systems  ?Constitutional: Negative.   ?HENT: Negative.    ?Eyes: Negative.   ?Respiratory: Negative.  Negative for shortness of breath (when lifting a heavy object and walking).   ?Cardiovascular: Negative.   ?Gastrointestinal: Negative.   ?Endocrine: Negative.   ?Genitourinary: Negative.   ?Musculoskeletal: Negative.   ?Skin: Negative.   ?Allergic/Immunologic: Negative.   ?Neurological: Negative.  Negative for dizziness and headaches.  ?Hematological: Negative.   ?Psychiatric/Behavioral: Negative.      ? ?Today's Vitals  ? 09/12/21 1458  ?BP: 138/88  ?Pulse: 91  ?Temp: 98.3 ?F (36.8 ?C)  ?TempSrc: Oral  ?SpO2: 97%  ?Weight: 161 lb 3.2 oz (73.1 kg)  ?Height: _0  (1.702 m)  ? ?Body mass index is 25.25 kg/m?.  ?Wt Readings from Last 3 Encounters:  ?09/26/21 161 lb (73 kg)  ?09/12/21 161 lb 3.2 oz (73.1 kg)  ?08/07/21 163 lb 9.6 oz (74.2 kg)  ? ? ?Objective:  ?Physical Exam ?Vitals reviewed.  ?Constitutional:   ?   General: He is not in acute distress. ?   Appearance: Normal appearance.  ?Cardiovascular:  ?   Rate and Rhythm: Normal rate and regular rhythm.  ?   Pulses: Normal pulses.  ?   Heart sounds: Normal heart sounds. No murmur heard. ?Pulmonary:  ?   Effort: Pulmonary effort is normal. No respiratory distress.  ?  Breath sounds: Normal breath sounds. No wheezing.  ?Skin: ?   General: Skin is warm and dry.  ?Neurological:  ?   General: No focal deficit present.  ?   Mental Status: He is alert and oriented to person, place, and time.  ?   Cranial Nerves: No cranial nerve deficit.  ?   Motor: No weakness.  ?Psychiatric:     ?   Mood and Affect: Mood normal.     ?   Behavior: Behavior normal.     ?   Thought Content: Thought content normal.     ?   Judgment: Judgment normal.  ?  ? ?   ?Assessment And Plan:  ?   ?1. Type 2 diabetes mellitus with other circulatory complication, without long-term current use of insulin ([place]) ?Comments: HgbA1c is improved at last visit, continue current medications ?- Renal function panel with eGFR ?- Hemoglobin A1c ?- Semaglutide, 1 MG/DOSE, (OZEMPIC, 1 MG/DOSE,) 4 MG/3ML SOPN; Inject 1 mg into the skin once a week.  Dispense: 9 mL; Refill: 1 ?- metFORMIN (GLUCOPHAGE) 500 MG tablet; Take 1 tablet (500 mg total) by mouth daily with breakfast.  Dispense: 90 tablet; Refill: 1 ? ?2. Hypertensive heart disease without heart failure ?Comments: Blood pressure is fairly controlled, he is encouraged to make sure he is taking his medications regularly ? ?3. Coronary artery disease involving  native coronary artery of native heart with angina pectoris ([place]) ?Comments: Continue follow up with Cardiology ?  ? ? ?Patient was given opportunity to ask questions. Patient verbalized understanding of the

## 2021-09-13 LAB — RENAL FUNCTION PANEL
Albumin: 4.3 g/dL (ref 3.7–4.7)
BUN/Creatinine Ratio: 13 (ref 10–24)
BUN: 13 mg/dL (ref 8–27)
CO2: 21 mmol/L (ref 20–29)
Calcium: 9.7 mg/dL (ref 8.6–10.2)
Chloride: 107 mmol/L — ABNORMAL HIGH (ref 96–106)
Creatinine, Ser: 0.97 mg/dL (ref 0.76–1.27)
Glucose: 101 mg/dL — ABNORMAL HIGH (ref 70–99)
Phosphorus: 3.1 mg/dL (ref 2.8–4.1)
Potassium: 3.7 mmol/L (ref 3.5–5.2)
Sodium: 144 mmol/L (ref 134–144)
eGFR: 83 mL/min/{1.73_m2} (ref >59)

## 2021-09-13 LAB — HEMOGLOBIN A1C
Est. average glucose Bld gHb Est-mCnc: 154 mg/dL
Hgb A1c MFr Bld: 7 % — ABNORMAL HIGH (ref 4.8–5.6)

## 2021-09-23 ENCOUNTER — Other Ambulatory Visit: Payer: Self-pay | Admitting: Nurse Practitioner

## 2021-09-23 DIAGNOSIS — I10 Essential (primary) hypertension: Secondary | ICD-10-CM

## 2021-09-24 ENCOUNTER — Other Ambulatory Visit: Payer: Self-pay

## 2021-09-24 DIAGNOSIS — I10 Essential (primary) hypertension: Secondary | ICD-10-CM

## 2021-09-24 MED ORDER — LOSARTAN POTASSIUM 100 MG PO TABS: 100.0000 mg | ORAL_TABLET | Freq: Every day | ORAL | 0 refills | Status: DC

## 2021-09-26 ENCOUNTER — Ambulatory Visit (INDEPENDENT_AMBULATORY_CARE_PROVIDER_SITE_OTHER): Payer: Medicare HMO

## 2021-09-26 VITALS — Ht 67.0 in | Wt 161.0 lb

## 2021-09-26 DIAGNOSIS — Z0000 Encounter for general adult medical examination without abnormal findings: Secondary | ICD-10-CM

## 2021-09-26 NOTE — Progress Notes (Signed)
?I connected with [name] today by telephone and verified that I am speaking with the correct person using two identifiers. ?Location patient: home ?Location provider: work ?Persons participating in the virtual visit: [name], [name] LPN. ?  ?I discussed the limitations, risks, security and privacy concerns of performing an evaluation and management service by telephone and the availability of in person appointments. I also discussed with the patient that there may be a patient responsible charge related to this service. The patient expressed understanding and verbally consented to this telephonic visit.  ?  ?Interactive audio and video telecommunications were attempted between this provider and patient, however failed, due to patient having technical difficulties OR patient did not have access to video capability.  We continued and completed visit with audio only. ? ?  ? ?Vital signs may be patient reported or missing. ? ?Subjective:  ? Ronald Faulkner is a 71 y.o. male who presents for Medicare Annual/Subsequent preventive examination. ? ?Review of Systems    ? ?Cardiac Risk Factors include: advanced age (>71mn, >>8women);diabetes mellitus;hypertension;male gender ? ?   ?Objective:  ?  ?Today's Vitals  ? 09/26/21 1355  ?Weight: 161 lb (73 kg)  ?Height: '5\' 7"'$  (1.702 m)  ? ?Body mass index is 25.22 kg/m?. ? ? ?  09/26/2021  ?  2:00 PM 09/05/2020  ?  2:45 PM 10/28/2019  ?  8:11 AM 07/13/2019  ?  9:12 AM 06/28/2018  ? 10:30 AM 06/23/2018  ?  9:41 AM 07/29/2017  ?  2:23 PM  ?Advanced Directives  ?Does Patient Have a Medical Advance Directive? No No No No No No No  ?Would patient like information on creating a medical advance directive?   No - Patient declined Yes (MAU/Ambulatory/Procedural Areas - Information given)  No - Patient declined No - Patient declined  ? ? ?Current Medications (verified) ?Outpatient Encounter Medications as of 09/26/2021  ?Medication Sig  ? allopurinol (ZYLOPRIM) 300 MG tablet Take 1  tablet by mouth once daily  ? amLODipine (NORVASC) 10 MG tablet Take 1 tablet (10 mg total) by mouth daily.  ? aspirin EC 81 MG tablet Take 81 mg by mouth daily.  ? diphenhydrAMINE (BENADRYL) 50 MG tablet Take 1 tablet (50 mg total) by mouth as needed (once per cath instructions).  ? ezetimibe (ZETIA) 10 MG tablet Take 1 tablet (10 mg total) by mouth daily.  ? fluticasone (FLONASE) 50 MCG/ACT nasal spray Place 2 sprays into both nostrils daily.  ? glucose blood test strip 1 each by Other route as needed for other. Use as instructed  ? Insulin Pen Needle 32G X 6 MM MISC Use with ozempic  ? isosorbide mononitrate (IMDUR) 60 MG 24 hr tablet Take 1.5 tablets (90 mg total) by mouth daily.  ? linaclotide (LINZESS) 72 MCG capsule Take 72 mcg by mouth every other day.  ? losartan (COZAAR) 100 MG tablet Take 1 tablet (100 mg total) by mouth daily.  ? melatonin 3 MG TABS tablet Take 1 tablet (3 mg total) by mouth at bedtime.  ? metFORMIN (GLUCOPHAGE) 500 MG tablet Take 1 tablet (500 mg total) by mouth daily with breakfast.  ? metoprolol succinate (TOPROL-XL) 50 MG 24 hr tablet Take 1 tablet (50 mg total) by mouth daily.  ? nitroGLYCERIN (NITROSTAT) 0.4 MG SL tablet DISSOLVE ONE TABLET UNDER THE TONGUE EVERY 5 MINUTES AS NEEDED FOR CHEST PAIN.&nbsp;&nbsp;DO NOT EXCEED A TOTAL OF 3 DOSES IN 15 MINUTES  ? oxymetazoline (AFRIN) 0.05 % nasal spray  Place 1 spray into both nostrils 2 (two) times daily as needed for congestion.  ? rosuvastatin (CRESTOR) 5 MG tablet TAKE ONE TABLET BY MOUTH ON MONDAY, WEDNESDAY, AND FRIDAY  ? Semaglutide, 1 MG/DOSE, (OZEMPIC, 1 MG/DOSE,) 4 MG/3ML SOPN Inject 1 mg into the skin once a week.  ? ?No facility-administered encounter medications on file as of 09/26/2021.  ? ? ?Allergies (verified) ?Bee venom, Fish allergy, and Ivp dye [iodinated contrast media]  ? ?History: ?Past Medical History:  ?Diagnosis Date  ? Arthritis   ? CAD (coronary artery disease)   ? a. s/p DES to mid and distal LAD in 07/2017  with medical management recommended of intermediate branch  ? Cancer [facility])   ? prostate  ? CHF (congestive heart failure) ([place])   ? Colon polyps   ? Diabetes mellitus without complication ([place])   ? Diverticulitis   ? History of kidney stones   ? Hypertension   ? IBS (irritable bowel syndrome)   ? 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 Seborrheic keratosis 05/20/2021  ? Dx by Dr. Tarri Isauro on 05/08/2021 -Pigmented  ? Unstable angina ([place]) 07/2017  ? ?Past Surgical History:  ?Procedure Laterality Date  ? ABDOMINAL SURGERY    ? APPENDECTOMY    ? BREAST SURGERY    ? benign lump at lt side  ? COLON SURGERY    ? COLONOSCOPY  03/14/2011  ? Procedure: COLONOSCOPY;  Surgeon: [name], MD;  [facility];  Service: Endoscopy;  Laterality: N/A;  1:00  ? COLONOSCOPY N/A 11/15/2015  ? Procedure: COLONOSCOPY;  Surgeon: [name], MD;  [facility];  Service: Endoscopy;  Laterality: N/A;  11:15  ? COLONOSCOPY WITH PROPOFOL N/A 07/15/2019  ? Procedure: COLONOSCOPY WITH PROPOFOL;  Surgeon: [name], MD;  [facility];  Service: Endoscopy;  Laterality: N/A;  1235  ? CORONARY STENT INTERVENTION N/A 07/31/2017  ? Procedure: CORONARY STENT INTERVENTION;  Surgeon: [name], MD;  Location: [place] [facility];  Service: Cardiovascular;  Laterality: N/A;  ? LEFT HEART CATH AND CORONARY ANGIOGRAPHY N/A 07/31/2017  ? Procedure: LEFT HEART CATH AND CORONARY ANGIOGRAPHY;  Surgeon: [name], MD;  Location: [place] [facility];  Service: Cardiovascular;  Laterality: N/A;  ? LEFT HEART CATH AND CORONARY ANGIOGRAPHY N/A 10/28/2019  ? Procedure: LEFT HEART CATH AND CORONARY ANGIOGRAPHY;  Surgeon: Belva Crome, MD;  Location: [place] [facility];  Service: Cardiovascular;  Laterality: N/A;  ? PENILE PROSTHESIS IMPLANT    ? 2016   ? prostate cancer    ? 2015. prostectomy  ? ?Family History  ?Problem Relation Age of Onset  ? Colon cancer Mother    ? Coronary artery disease Maternal Grandmother   ? Anesthesia problems Neg Hx   ? Hypotension Neg Hx   ? Malignant hyperthermia Neg Hx   ? Pseudochol deficiency Neg Hx   ? ?Social History  ? ?Socioeconomic History  ? Marital status: Married  ?  Spouse name: Not on file  ? Number of children: 2  ? Years of education: Not on file  ? Highest education level: Not on file  ?Occupational History  ?  Employer: [employer]  ?Tobacco Use  ? Smoking status: Former  ?  Packs/day: 1.00  ?  Years: 35.00  ?  Pack years: 35.00  ?  Types: Cigarettes  ?  Quit date: 06/03/1999  ?  Years since quitting:  22.3  ? Smokeless tobacco: Never  ?Vaping Use  ? Vaping Use: Never used  ?Substance and Sexual Activity  ? Alcohol use: Yes  ?  Comment: occ  ? Drug use: No  ?  Comment: hx of use in his colege days  ? Sexual activity: Yes  ?Other Topics Concern  ? Not on file  ?Social History Narrative  ? Lives at home with wife and daughter and her three children.    ? ?Social Determinants of Health  ? ?Financial Resource Strain: Low Risk   ? Difficulty of Paying Living Expenses: Not hard at all  ?Food Insecurity: No Food Insecurity  ? Worried About Charity fundraiser in the Last Year: Never true  ? Ran Out of Food in the Last Year: Never true  ?Transportation Needs: No Transportation Needs  ? Lack of Transportation (Medical): No  ? Lack of Transportation (Non-Medical): No  ?Physical Activity: Sufficiently Active  ? Days of Exercise per Week: 4 days  ? Minutes of Exercise per Session: 40 min  ?Stress: No Stress Concern Present  ? Feeling of Stress : Not at all  ?Social Connections: Not on file  ? ? ?Tobacco Counseling ?Counseling given: Not Answered ? ? ?Clinical Intake: ? ?Pre-visit preparation completed: Yes ? ?Pain : No/denies pain ? ?  ? ?Nutritional Status: BMI 25 -29 Overweight ?Nutritional Risks: None ?Diabetes: Yes ? ?How often do you need to have someone help you when you read instructions, pamphlets, or other written materials from  your doctor or pharmacy?: 1 - Never ?What is the last grade level you completed in school?: college ? ?Diabetic? Yes ?Nutrition Risk Assessment: ? ?Has the patient had any N/V/D within the last 2 months?

## 2021-09-26 NOTE — Patient Instructions (Signed)
Mr. Mesta , ?Thank you for taking time to come for your Medicare Wellness Visit. I appreciate your ongoing commitment to your health goals. Please review the following plan we discussed and let me know if I can assist you in the future.  ? ?Screening recommendations/referrals: ?Colonoscopy: completed 07/15/2019, due 07/14/2024 ?Recommended yearly ophthalmology/optometry visit for glaucoma screening and checkup ?Recommended yearly dental visit for hygiene and checkup ? ?Vaccinations: ?Influenza vaccine: due 12/31/2021 ?Pneumococcal vaccine: completed 09/05/2020 ?Tdap vaccine: completed 09/05/2020, due 09/06/2030 ?Shingles vaccine: completed   ?Covid-19:  06/05/2020, 08/25/2020, 07/28/2019 ? ?Advanced directives: Advance directive discussed with you today.  ? ?Conditions/risks identified: none ? ?Next appointment: Follow up in one year for your annual wellness visit.  ? ?Preventive Care 22 Years and Older, Male ?Preventive care refers to lifestyle choices and visits with your health care provider that can promote health and wellness. ?What does preventive care include? ?A yearly physical exam. This is also called an annual well check. ?Dental exams once or twice a year. ?Routine eye exams. Ask your health care provider how often you should have your eyes checked. ?Personal lifestyle choices, including: ?Daily care of your teeth and gums. ?Regular physical activity. ?Eating a healthy diet. ?Avoiding tobacco and drug use. ?Limiting alcohol use. ?Practicing safe sex. ?Taking low doses of aspirin every day. ?Taking vitamin and mineral supplements as recommended by your health care provider. ?What happens during an annual well check? ?The services and screenings done by your health care provider during your annual well check will depend on your age, overall health, lifestyle risk factors, and family history of disease. ?Counseling  ?Your health care provider may ask you questions about your: ?Alcohol use. ?Tobacco use. ?Drug  use. ?Emotional well-being. ?Home and relationship well-being. ?Sexual activity. ?Eating habits. ?History of falls. ?Memory and ability to understand (cognition). ?Work and work Statistician. ?Screening  ?You may have the following tests or measurements: ?Height, weight, and BMI. ?Blood pressure. ?Lipid and cholesterol levels. These may be checked every 5 years, or more frequently if you are over 52 years old. ?Skin check. ?Lung cancer screening. You may have this screening every year starting at age 69 if you have a 30-pack-year history of smoking and currently smoke or have quit within the past 15 years. ?Fecal occult blood test (FOBT) of the stool. You may have this test every year starting at age 25. ?Flexible sigmoidoscopy or colonoscopy. You may have a sigmoidoscopy every 5 years or a colonoscopy every 10 years starting at age 102. ?Prostate cancer screening. Recommendations will vary depending on your family history and other risks. ?Hepatitis C blood test. ?Hepatitis B blood test. ?Sexually transmitted disease (STD) testing. ?Diabetes screening. This is done by checking your blood sugar (glucose) after you have not eaten for a while (fasting). You may have this done every 1-3 years. ?Abdominal aortic aneurysm (AAA) screening. You may need this if you are a current or former smoker. ?Osteoporosis. You may be screened starting at age 39 if you are at high risk. ?Talk with your health care provider about your test results, treatment options, and if necessary, the need for more tests. ?Vaccines  ?Your health care provider may recommend certain vaccines, such as: ?Influenza vaccine. This is recommended every year. ?Tetanus, diphtheria, and acellular pertussis (Tdap, Td) vaccine. You may need a Td booster every 10 years. ?Zoster vaccine. You may need this after age 62. ?Pneumococcal 13-valent conjugate (PCV13) vaccine. One dose is recommended after age 1. ?Pneumococcal polysaccharide (PPSV23) vaccine. One dose  is  recommended after age 47. ?Talk to your health care provider about which screenings and vaccines you need and how often you need them. ?This information is not intended to replace advice given to you by your health care provider. Make sure you discuss any questions you have with your health care provider. ?Document Released: 06/15/2015 Document Revised: 02/06/2016 Document Reviewed: 03/20/2015 ?Elsevier Interactive Patient Education ? 2017 [place]. ? ?Fall Prevention in the Home ?Falls can cause injuries. They can happen to people of all ages. There are many things you can do to make your home safe and to help prevent falls. ?What can I do on the outside of my home? ?Regularly fix the edges of walkways and driveways and fix any cracks. ?Remove anything that might make you trip as you walk through a door, such as a raised step or threshold. ?Trim any bushes or trees on the path to your home. ?Use bright outdoor lighting. ?Clear any walking paths of anything that might make someone trip, such as rocks or tools. ?Regularly check to see if handrails are loose or broken. Make sure that both sides of any steps have handrails. ?Any raised decks and porches should have guardrails on the edges. ?Have any leaves, snow, or ice cleared regularly. ?Use sand or salt on walking paths during winter. ?Clean up any spills in your garage right away. This includes oil or grease spills. ?What can I do in the bathroom? ?Use night lights. ?Install grab bars by the toilet and in the tub and shower. Do not use towel bars as grab bars. ?Use non-skid mats or decals in the tub or shower. ?If you need to sit down in the shower, use a plastic, non-slip stool. ?Keep the floor dry. Clean up any water that spills on the floor as soon as it happens. ?Remove soap buildup in the tub or shower regularly. ?Attach bath mats securely with double-sided non-slip rug tape. ?Do not have throw rugs and other things on the floor that can make you  trip. ?What can I do in the bedroom? ?Use night lights. ?Make sure that you have a light by your bed that is easy to reach. ?Do not use any sheets or blankets that are too big for your bed. They should not hang down onto the floor. ?Have a firm chair that has side arms. You can use this for support while you get dressed. ?Do not have throw rugs and other things on the floor that can make you trip. ?What can I do in the kitchen? ?Clean up any spills right away. ?Avoid walking on wet floors. ?Keep items that you use a lot in easy-to-reach places. ?If you need to reach something above you, use a strong step stool that has a grab bar. ?Keep electrical cords out of the way. ?Do not use floor polish or wax that makes floors slippery. If you must use wax, use non-skid floor wax. ?Do not have throw rugs and other things on the floor that can make you trip. ?What can I do with my stairs? ?Do not leave any items on the stairs. ?Make sure that there are handrails on both sides of the stairs and use them. Fix handrails that are broken or loose. Make sure that handrails are as long as the stairways. ?Check any carpeting to make sure that it is firmly attached to the stairs. Fix any carpet that is loose or worn. ?Avoid having throw rugs at the top or bottom of the stairs.  If you do have throw rugs, attach them to the floor with carpet tape. ?Make sure that you have a light switch at the top of the stairs and the bottom of the stairs. If you do not have them, ask someone to add them for you. ?What else can I do to help prevent falls? ?Wear shoes that: ?Do not have high heels. ?Have rubber bottoms. ?Are comfortable and fit you well. ?Are closed at the toe. Do not wear sandals. ?If you use a stepladder: ?Make sure that it is fully opened. Do not climb a closed stepladder. ?Make sure that both sides of the stepladder are locked into place. ?Ask someone to hold it for you, if possible. ?Clearly mark and make sure that you can  see: ?Any grab bars or handrails. ?First and last steps. ?Where the edge of each step is. ?Use tools that help you move around (mobility aids) if they are needed. These include: ?Canes. ?Walkers. ?Scooters. ?Crutches. ?Turn on th

## 2021-10-09 ENCOUNTER — Other Ambulatory Visit: Payer: Self-pay

## 2021-10-09 MED ORDER — ALLOPURINOL 300 MG PO TABS: 300.0000 mg | ORAL_TABLET | Freq: Every day | ORAL | 0 refills | Status: DC

## 2021-10-10 ENCOUNTER — Other Ambulatory Visit: Payer: Self-pay

## 2021-10-10 MED ORDER — GLUCOSE BLOOD VI STRP: 1.0000 | ORAL_STRIP | 6 refills | Status: DC | PRN

## 2021-10-29 ENCOUNTER — Other Ambulatory Visit: Payer: Self-pay

## 2021-10-29 MED ORDER — AMLODIPINE BESYLATE 10 MG PO TABS: 10.0000 mg | ORAL_TABLET | Freq: Every day | ORAL | 1 refills | Status: DC

## 2021-10-31 ENCOUNTER — Other Ambulatory Visit: Payer: Self-pay

## 2021-11-07 ENCOUNTER — Ambulatory Visit (INDEPENDENT_AMBULATORY_CARE_PROVIDER_SITE_OTHER): Payer: Medicare HMO | Admitting: Cardiology

## 2021-11-07 ENCOUNTER — Encounter: Payer: Self-pay | Admitting: Cardiology

## 2021-11-07 VITALS — BP 110/70 | HR 83 | Ht 67.0 in | Wt 162.6 lb

## 2021-11-07 DIAGNOSIS — E785 Hyperlipidemia, unspecified: Secondary | ICD-10-CM

## 2021-11-07 DIAGNOSIS — R0602 Shortness of breath: Secondary | ICD-10-CM

## 2021-11-07 DIAGNOSIS — I2510 Atherosclerotic heart disease of native coronary artery without angina pectoris: Secondary | ICD-10-CM

## 2021-11-07 DIAGNOSIS — Z79899 Other long term (current) drug therapy: Secondary | ICD-10-CM | POA: Diagnosis not present

## 2021-11-07 NOTE — Patient Instructions (Addendum)
Medication Instructions:  Your physician recommends that you continue on your current medications as directed. Please refer to the Current Medication list given to you today.  Labwork: Your physician recommends that you return for a FASTING lipid profile as soon as possible. Please do not eat or drink for at least 8 hours when you have this done. You may take your medications that morning with a sip of water. [facility]  Testing/Procedures: Your physician has recommended that you have a pulmonary function test. Pulmonary Function Tests are a group of tests that measure how well air moves in and out of your lungs.  Follow-Up: Your physician recommends that you schedule a follow-up appointment in: 6 months  Any Other Special Instructions Will Be Listed Below (If Applicable).  If you need a refill on your cardiac medications before your next appointment, please call your pharmacy.

## 2021-11-07 NOTE — Progress Notes (Signed)
Clinical Summary Mr. Portner is a 71 y.o.male seen today for follow up of the following medical problems.  1.CAD -history of DES x 2  to LAD in 07/2017 - 10/2019 cath: patent stents LAD, high grade diffuse disease small ramus, normal LCX, OM 50%, RCA 30% distal. LVEDP 20 - 07/2020 echo: LVEF 60-65%, no WMAs, grade I dd, normal RV function.  - 03/2021 nuclear stress: no ischemia - headaches on imdur '90mg'$  though would be willing to retry.    - last visti increased toprol to '50mg'$  daily.   - some recent chest pains. Left sided, grabbing like pain. Can be 10/10 in severity. Can occur at rest or with activity. Lasts just a few seconds, SOB. Not positional. Seems to be worst when actively constipated, he is on linzess by his GI doctor.    2.Hyperlipidemia - difficultly tolerating statin, no crestor 3 days a week and zetia  - he is on zetia - Jan 2023 TC 167 TG 102 HDL 58 LDL 91. Appears was not taking zetia at that time.    3. HTN - compliant with meds   4. SOB - ongoing 3-4 months - no coughing, no wheezing - DOE walking 30 to 40 feet. Some associated chest pain. Squeezing like feeling left sided, can be mild to moderate. Pain lasts a few minutes. Symptoms improve with rest. - increasing in frequency.  - occasional LE edema  -ongoing SOB unchanged   2020 CT no aneurysm Past Medical History:  Diagnosis Date   Arthritis    CAD (coronary artery disease)    a. 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 Tarri Doyl on 05/08/2021 -Pigmented   Unstable angina ([place]) 07/2017      Allergies  Allergen Reactions   Bee Venom    Fish Allergy Hives and Swelling   Ivp Dye [Iodinated Contrast Media] Swelling     Current Outpatient Medications  Medication Sig Dispense Refill   allopurinol (ZYLOPRIM) 300 MG tablet Take 1 tablet (300 mg total) by mouth daily. 90 tablet 0   amLODipine (NORVASC) 10 MG tablet Take 1 tablet (10 mg total) by mouth daily. 90 tablet 1   aspirin EC 81 MG tablet Take 81 mg by mouth daily.     diphenhydrAMINE (BENADRYL) 50 MG tablet Take 1 tablet (50 mg total) by mouth as needed (once per cath instructions). 1 tablet 0   ezetimibe (ZETIA) 10 MG tablet Take 1 tablet (10 mg total) by mouth daily. 90 tablet 2   fluticasone (FLONASE) 50 MCG/ACT nasal spray Place 2 sprays into both nostrils daily.     glucose blood test strip 1 each by Other route as needed for other. Use as instructed 100 each 6   Insulin Pen Needle 32G X 6 MM MISC Use with ozempic 50 each 3   isosorbide mononitrate (IMDUR) 60 MG 24 hr tablet Take 1.5 tablets (90 mg total) by mouth daily. 135 tablet 1   linaclotide (LINZESS) 72 MCG capsule Take 72 mcg by mouth  every other day.     losartan (COZAAR) 100 MG tablet Take 1 tablet (100 mg total) by mouth daily. 90 tablet 0   melatonin 3 MG TABS tablet Take 1 tablet (3 mg total) by mouth at bedtime. 14 tablet 0   metFORMIN (GLUCOPHAGE) 500 MG tablet Take 1 tablet (500 mg total) by mouth daily with breakfast. 90 tablet 1   metoprolol succinate (TOPROL-XL) 50 MG 24 hr tablet Take 1 tablet (50 mg total) by mouth daily. 90 tablet 1   nitroGLYCERIN (NITROSTAT) 0.4 MG SL tablet DISSOLVE ONE TABLET UNDER THE TONGUE EVERY 5 MINUTES AS NEEDED FOR CHEST PAIN.&nbsp;&nbsp;DO NOT EXCEED A TOTAL OF 3 DOSES IN 15 MINUTES 25 tablet 0   oxymetazoline (AFRIN) 0.05 % nasal spray Place 1 spray into both nostrils 2 (two) times daily as needed for congestion.     rosuvastatin (CRESTOR) 5 MG tablet TAKE ONE TABLET BY MOUTH ON MONDAY, WEDNESDAY, AND FRIDAY 36  tablet 3   Semaglutide, 1 MG/DOSE, (OZEMPIC, 1 MG/DOSE,) 4 MG/3ML SOPN Inject 1 mg into the skin once a week. 9 mL 1   No current facility-administered medications for this visit.     Past Surgical History:  Procedure Laterality Date   ABDOMINAL SURGERY     APPENDECTOMY     BREAST SURGERY     benign lump at lt side   COLON SURGERY     COLONOSCOPY  03/14/2011   Procedure: COLONOSCOPY;  Surgeon: [name], MD;  [facility];  Service: Endoscopy;  Laterality: N/A;  1:00   COLONOSCOPY N/A 11/15/2015   Procedure: COLONOSCOPY;  Surgeon: [name], MD;  [facility]: [facility];  Service: Endoscopy;  Laterality: N/A;  11:15   COLONOSCOPY WITH PROPOFOL N/A 07/15/2019   Procedure: COLONOSCOPY WITH PROPOFOL;  Surgeon: [name], MD;  [facility]: [facility];  Service: Endoscopy;  Laterality: N/A;  1235   CORONARY STENT INTERVENTION N/A 07/31/2017   Procedure: CORONARY STENT INTERVENTION;  Surgeon: [name], MD;  Location: [place] [facility];  Service: Cardiovascular;  Laterality: N/A;   LEFT HEART CATH AND CORONARY ANGIOGRAPHY N/A 07/31/2017   Procedure: LEFT HEART CATH AND CORONARY ANGIOGRAPHY;  Surgeon: [name], MD;  Location: [place] [facility];  Service: Cardiovascular;  Laterality: N/A;   LEFT HEART CATH AND CORONARY ANGIOGRAPHY N/A 10/28/2019   Procedure: LEFT HEART CATH AND CORONARY ANGIOGRAPHY;  Surgeon: Belva Crome, MD;  Location: [place] [facility];  Service: Cardiovascular;  Laterality: N/A;   PENILE PROSTHESIS IMPLANT     2016    prostate cancer     2015. prostectomy     Allergies  Allergen Reactions   Bee Venom    Fish Allergy Hives and Swelling   Ivp Dye [Iodinated Contrast Media] Swelling      Family History  Problem Relation Age of Onset   Colon cancer Mother    Coronary artery disease Maternal Grandmother    Anesthesia problems Neg Hx    Hypotension Neg Hx    Malignant hyperthermia Neg Hx    Pseudochol  deficiency Neg Hx      Social History Mr. Stiff reports that he quit smoking about 22 years ago. His smoking use included cigarettes. He has a 35.00 pack-year smoking history. He has never used smokeless tobacco. Mr. Jarnigan reports current alcohol use.   Review of Systems CONSTITUTIONAL: No weight loss, fever, chills, weakness or fatigue.  HEENT: Eyes: No visual loss, blurred vision,  double vision or yellow sclerae.No hearing loss, sneezing, congestion, runny nose or sore throat.  SKIN: No rash or itching.  CARDIOVASCULAR: per hpi RESPIRATORY: No shortness of breath, cough or sputum.  GASTROINTESTINAL: No anorexia, nausea, vomiting or diarrhea. No abdominal pain or blood.  GENITOURINARY: No burning on urination, no polyuria NEUROLOGICAL: No headache, dizziness, syncope, paralysis, ataxia, numbness or tingling in the extremities. No change in bowel or bladder control.  MUSCULOSKELETAL: No muscle, back pain, joint pain or stiffness.  LYMPHATICS: No enlarged nodes. No history of splenectomy.  PSYCHIATRIC: No history of depression or anxiety.  ENDOCRINOLOGIC: No reports of sweating, cold or heat intolerance. No polyuria or polydipsia.  Marland Kitchen   Physical Examination Today's Vitals   11/07/21 1144  BP: 110/70  Pulse: 83  SpO2: 97%  Weight: 162 lb 9.6 oz (73.8 kg)  Height: '5\' 7"'$  (1.702 m)   Body mass index is 25.47 kg/m.  Gen: resting comfortably, no acute distress HEENT: no scleral icterus, pupils equal round and reactive, no palptable cervical adenopathy,  CV: RRR, no m/r/g, no jvd Resp: Clear to auscultation bilaterally GI: abdomen is soft, non-tender, non-distended, normal bowel sounds, no hepatosplenomegaly MSK: extremities are warm, no edema.  Skin: warm, no rash Neuro:  no focal deficits Psych: appropriate affect   Diagnostic Studies  07/2017 cath Prox RCA to Mid RCA lesion is 20% stenosed. Dist RCA lesion is 50% stenosed. Dist Cx lesion is 60% stenosed. Ost Cx to Prox  Cx lesion is 20% stenosed. Ost 1st Mrg to 1st Mrg lesion is 70% stenosed. Ost LAD to Prox LAD lesion is 20% stenosed. Mid LAD-1 lesion is 80% stenosed. Mid LAD-2 lesion is 95% stenosed. Dist LAD lesion is 70% stenosed. A drug-eluting stent was successfully placed using a STENT SIERRA 2.25 X 12 MM. Post intervention, there is a 0% residual stenosis. A drug-eluting stent was successfully placed using a STENT SIERRA 2.50 X 18 MM. Post intervention, there is a 0% residual stenosis.   1. Severe stenosis in the mid and distal LAD. Successful PTCA/DES x 1 distal LAD and PTCA/DES x 1 mid LAD.  2. Modeately severe stenosis in small caliber intermediate Asmaa Tirpak.  3. Moderate stenosis in the distal RCA   Post cath Recommendations: Will continue DAPT with ASA and Plavix for at least one year. Will continue beta blocker and statin. I would recommend medical management of the disease in the small caliber intermediate Jenesys Casseus.    10/2019 cath Patent left main Patent LAD mid and distal stent. High-grade diffuse obstructive disease in a branching relatively small ramus intermedius.  Slight progression compared to 2019. Widely patent circumflex with first obtuse marginal containing a 50% proximal narrowing Widely patent dominant right coronary with 30 to 40% narrowing distally. Normal LV function with EF 55%.  LVEDP 20 mmHg.   RECOMMENDATIONS:   Continue current therapy Further medication adjustments and management per Dr.,Konesweran     07/2020 echo 1. Left ventricular ejection fraction, by estimation, is 60 to 65%. The  left ventricle has normal function. The left ventricle has no regional  wall motion abnormalities. Left ventricular diastolic parameters are  consistent with Grade I diastolic  dysfunction (impaired relaxation).   2. Right ventricular systolic function is normal. The right ventricular  size is normal. Tricuspid regurgitation signal is inadequate for assessing  PA pressure.   3.  The mitral valve is grossly normal. Trivial mitral valve  regurgitation.   4. The aortic valve is tricuspid. There is mild calcification of the  aortic valve. Aortic valve regurgitation is not visualized.   5. The inferior vena cava is normal in size with greater than 50%  respiratory variability, suggesting right atrial pressure of 3 mmHg.   Comparison(s): Echocardiogram done 10/13/18 showed an EF of 60-65%     03/2021 nuclear stress:  The study is normal. There are no perfusion defects consistent with prior infarct or current ischemia. The study is low risk.   No ST deviation was noted.   There is a moderate size moderate intensity inferior defect that is most intense in the resting images with normal wall motion. Findings consistent with diaphragmatic attenuation and artifact due to adjacet gut tracer uptake.   Left ventricular function is normal. Nuclear stress EF: 67 %. The left ventricular ejection fraction is hyperdynamic (>65%). End diastolic cavity size is normal.     Assessment and Plan   1.CAD with unspecified angina - recent DOE, atypical chest pains.  - 2021 cath with stable disease including residual ramus disease, 03/2021 nuclear stress no ischemia - unclear symptoms are cardiac related, will check PFTs - continue current meds  2. Hyperlipidemia - repeat lipid panel       [name], M.D.

## 2021-11-08 ENCOUNTER — Encounter: Payer: Self-pay | Admitting: Cardiology

## 2021-11-14 ENCOUNTER — Ambulatory Visit (HOSPITAL_COMMUNITY)
Admission: RE | Admit: 2021-11-14 | Discharge: 2021-11-14 | Disposition: A | Discharge status: Home / Self Care | Payer: Medicare HMO | Source: Ambulatory Visit | Attending: Cardiology | Admitting: Cardiology

## 2021-11-14 DIAGNOSIS — R0602 Shortness of breath: Secondary | ICD-10-CM | POA: Diagnosis present

## 2021-11-14 LAB — PULMONARY FUNCTION TEST
DL/VA % pred: 76 %
DL/VA: 3.12 ml/min/mmHg/L
DLCO unc % pred: 58 %
DLCO unc: 13.65 ml/min/mmHg
FEF 25-75 Post: 3.04 L/sec
FEF 25-75 Pre: 2.31 L/sec
FEF2575-%Change-Post: 31 %
FEF2575-%Pred-Post: 140 %
FEF2575-%Pred-Pre: 106 %
FEV1-%Change-Post: 8 %
FEV1-%Pred-Post: 97 %
FEV1-%Pred-Pre: 90 %
FEV1-Post: 2.47 L
FEV1-Pre: 2.27 L
FEV1FVC-%Change-Post: 2 %
FEV1FVC-%Pred-Pre: 105 %
FEV6-%Change-Post: 5 %
FEV6-%Pred-Post: 93 %
FEV6-%Pred-Pre: 88 %
FEV6-Post: 2.99 L
FEV6-Pre: 2.83 L
FEV6FVC-%Pred-Post: 105 %
FEV6FVC-%Pred-Pre: 105 %
FVC-%Change-Post: 5 %
FVC-%Pred-Post: 88 %
FVC-%Pred-Pre: 83 %
FVC-Post: 2.99 L
FVC-Pre: 2.83 L
Post FEV1/FVC ratio: 82 %
Post FEV6/FVC ratio: 100 %
Pre FEV1/FVC ratio: 80 %
Pre FEV6/FVC Ratio: 100 %
RV % pred: 162 %
RV: 3.76 L
TLC % pred: 103 %
TLC: 6.64 L

## 2021-11-14 MED ORDER — ALBUTEROL SULFATE (2.5 MG/3ML) 0.083% IN NEBU
2.5000 mg | INHALATION_SOLUTION | Freq: Once | RESPIRATORY_TRACT | Status: AC
  Administered 2021-11-14: 2.5 mg via RESPIRATORY_TRACT

## 2021-11-18 ENCOUNTER — Telehealth: Payer: Self-pay

## 2021-11-18 NOTE — Chronic Care Management (AMB) (Signed)
    Called Ronald Faulkner, No answer, left message of appointment on 11-20-2021 at 3:20 via telephone visit with [name], Pharm D. Notified to have all medications, supplements, blood pressure and/or blood sugar logs available during appointment and to return call if need to reschedule.   [place] Pharmacist Assistant [phone]

## 2021-11-20 ENCOUNTER — Other Ambulatory Visit: Payer: Self-pay

## 2021-11-20 ENCOUNTER — Ambulatory Visit (INDEPENDENT_AMBULATORY_CARE_PROVIDER_SITE_OTHER): Payer: Medicare HMO

## 2021-11-20 DIAGNOSIS — E1159 Type 2 diabetes mellitus with other circulatory complications: Secondary | ICD-10-CM

## 2021-11-20 DIAGNOSIS — I119 Hypertensive heart disease without heart failure: Secondary | ICD-10-CM

## 2021-11-20 MED ORDER — METFORMIN HCL ER 500 MG PO TB24: 500.0000 mg | ORAL_TABLET | Freq: Two times a day (BID) | ORAL | 1 refills | Status: DC

## 2021-11-20 NOTE — Progress Notes (Unsigned)
Current Barriers:  {pharmacybarriers:24917}  Pharmacist Clinical Goal(s):  Patient will {PHARMACYGOALCHOICES:24921} through collaboration with PharmD and provider.   Interventions: 1:1 collaboration with [name], FNP regarding development and update of comprehensive plan of care as evidenced by provider attestation and co-signature Inter-disciplinary care team collaboration (see longitudinal plan of care) Comprehensive medication review performed; medication list updated in electronic medical record  Hypertension (BP goal <130/80) -Controlled -Current treatment: Amlodipine 10 mg tablet daily {Appropriate:26852::"Appropriate"}, {Effective:26853::"Effective"}, {Safe:26854::"Safe"}, {accessible:26855::"Accessible"} Isosorbide Mononitrate 60 mg tablet 1.5 tablets by mouth daily {Appropriate:26852::"Appropriate"}, {Effective:26853::"Effective"}, {Safe:26854::"Safe"}, {accessible:26855::"Accessible"} Losartan 100 mg tablet once per day {Appropriate:26852::"Appropriate"}, {Effective:26853::"Effective"}, {Safe:26854::"Safe"}, {accessible:26855::"Accessible"} Metoprolol Succinate 50 mg tablet - take 1 tablet by mouth daily {Appropriate:26852::"Appropriate"}, {Effective:26853::"Effective"}, {Safe:26854::"Safe"}, {accessible:26855::"Accessible"} -Current home readings: 117/75 -Current dietary habits: he is working on limiting the amount of salt he is eating, he reports eating less fried chicken -Current exercise habits: he is walking, and then he walks at his job he is also playing basketball -Denies hypotensive/hypertensive symptoms -Educated on Importance of home blood pressure monitoring; Proper BP monitoring technique; -Counseled to monitor BP at home at least twice per week, document, and provide log at future appointments -Recommended to continue current medication  Diabetes (A1c goal <7%) -Not ideally controlled -Current medications: Metformin 500 mg -taking 1 tablet by mouth daily  Appropriate, Query effective Ozempic 0.5 mg once per week Appropriate, Query effective -Current home glucose readings fasting glucose: 151,155, 121,120 -Denies hypoglycemic/hyperglycemic symptoms -Current meal patterns: he is doing  drinks: in the last few weeks he has not been drinking as much water, he does not like water. He  -Current exercise: he is walking least 8000 steps per day  -Educated on A1c and blood sugar goals; Complications of diabetes including kidney damage, retinal damage, and cardiovascular disease; -Counseled to check feet daily and get yearly eye exams -Recommended to continue current medication Collaborated with PCP to increase patients Metformin to 500 mg tablet XR twice per day.   Patient Goals/Self-Care Activities Patient will:  - take medications as prescribed as evidenced by patient report and record review collaborate with provider on medication access solutions  Follow Up Plan: The patient has been provided with contact information for the care management team and has been advised to call with any health related questions or concerns.    Chronic Care Management Pharmacy Note  11/20/2021 Name:  Ronald Faulkner MRN:  [id number] DOB:  1950-09-08  Summary: ***  Recommendations/Changes made from today's visit: ***  Plan: ***   Subjective: Ronald Faulkner is an 71 y.o. year old male who is a primary patient of [name], [place].  The CCM team was consulted for assistance with disease management and care coordination needs.    Engaged with patient by telephone for follow up visit in response to provider referral for pharmacy case management and/or care coordination services.   Consent to Services:  The patient was given information about Chronic Care Management services, agreed to services, and gave verbal consent prior to initiation of services.  Please see initial visit note for detailed documentation.   Patient Care Team: [name], FNP as PCP -  General ([place]) Harl Bowie Alphonse Guild, MD as PCP - Cardiology (Cardiology) Mayford Knife, [facility] (Pharmacist)  Recent office visits: ***  Recent consult visits: [facility] visits: {Hospital DC Yes/No:25215}   Objective:  Lab Results  Component Value Date   CREATININE 0.97 09/12/2021   BUN 13 09/12/2021   EGFR 83 09/12/2021   GFRNONAA 81 06/05/2020  GFRAA 93 06/05/2020   NA 144 09/12/2021   K 3.7 09/12/2021   CALCIUM 9.7 09/12/2021   CO2 21 09/12/2021   GLUCOSE 101 (H) 09/12/2021    Lab Results  Component Value Date/Time   HGBA1C 7.0 (H) 09/12/2021 03:46 PM   HGBA1C 7.2 (H) 07/02/2021 09:48 AM   MICROALBUR 150 09/05/2020 03:51 PM    Last diabetic Eye exam:  Lab Results  Component Value Date/Time   HMDIABEYEEXA No Retinopathy 08/22/2020 12:00 AM    Last diabetic Foot exam: No results found for: "HMDIABFOOTEX"   Lab Results  Component Value Date   CHOL 167 07/02/2021   HDL 58 07/02/2021   LDLCALC 91 07/02/2021   TRIG 102 07/02/2021   CHOLHDL 2.9 07/02/2021       Latest Ref Rng & Units 09/12/2021    3:46 PM 07/02/2021    9:48 AM 11/27/2020   10:34 AM  Hepatic Function  Total Protein 6.0 - 8.5 g/dL  7.8  7.1   Albumin 3.7 - 4.7 g/dL 4.3  4.5  4.3   AST 0 - 40 IU/L  28  19   ALT 0 - 44 IU/L  21  18   Alk Phosphatase 44 - 121 IU/L  169  158   Total Bilirubin 0.0 - 1.2 mg/dL  0.3  0.2     No results found for: "TSH", "FREET4"     Latest Ref Rng & Units 11/27/2020   10:34 AM 06/05/2020    3:35 PM 10/27/2019    9:33 AM  CBC  WBC 3.4 - 10.8 x10E3/uL 4.1  6.7  6.3   Hemoglobin 13.0 - 17.7 g/dL 13.1  13.4  12.7   Hematocrit 37.5 - 51.0 % 40.3  41.1  40.6   Platelets 150 - 450 x10E3/uL 219  260  252     No results found for: "VD25OH"  Clinical ASCVD: {YES/NO:21197} The 10-year ASCVD risk score (Arnett DK, et al., 2019) is: 24.2%   Values used to calculate the score:     Age: 67 years     Sex: Male     Is Non-Hispanic African American:  Yes     Diabetic: Yes     Tobacco smoker: No     Systolic Blood Pressure: 250 mmHg     Is BP treated: Yes     HDL Cholesterol: 49 mg/dL     Total Cholesterol: 134 mg/dL       09/26/2021    2:01 PM 09/12/2021    3:03 PM 09/05/2020    2:46 PM  Depression screen PHQ 2/9  Decreased Interest 0 0 0  Down, Depressed, Hopeless 0 0 0  PHQ - 2 Score 0 0 0  Altered sleeping  0   Tired, decreased energy  0   Change in appetite  0   Feeling bad or failure about yourself   0   Trouble concentrating  0   Moving slowly or fidgety/restless  0   Suicidal thoughts  0   PHQ-9 Score  0      ***Other: (CHADS2VASc if Afib, MMRC or CAT for COPD, ACT, DEXA)  Social History   Tobacco Use  Smoking Status Former   Packs/day: 1.00   Years: 35.00   Total pack years: 35.00   Types: Cigarettes   Quit date: 06/03/1999   Years since quitting: 22.4  Smokeless Tobacco Never   BP Readings from Last 3 Encounters:  11/07/21 110/70  09/12/21 138/88  08/07/21 120/76  Pulse Readings from Last 3 Encounters:  11/07/21 83  09/12/21 91  08/07/21 80   Wt Readings from Last 3 Encounters:  11/07/21 162 lb 9.6 oz (73.8 kg)  09/26/21 161 lb (73 kg)  09/12/21 161 lb 3.2 oz (73.1 kg)   BMI Readings from Last 3 Encounters:  11/07/21 25.47 kg/m  09/26/21 25.22 kg/m  09/12/21 25.25 kg/m    Assessment/Interventions: Review of patient past medical history, allergies, medications, health status, including review of consultants reports, laboratory and other test data, was performed as part of comprehensive evaluation and provision of chronic care management services.   SDOH:  (Social Determinants of Health) assessments and interventions performed: {yes/no:20286}  SDOH Screenings   Alcohol Screen: Not on file  Depression (PHQ2-9): Low Risk  (09/26/2021)   Depression (PHQ2-9)    PHQ-2 Score: 0  Financial Resource Strain: Low Risk  (09/26/2021)   Overall Financial Resource Strain (CARDIA)    Difficulty of  Paying Living Expenses: Not hard at all  Food Insecurity: No Food Insecurity (09/26/2021)   Hunger Vital Sign    Worried About Running Out of Food in the Last Year: Never true    Ran Out of Food in the Last Year: Never true  Housing: Not on file  Physical Activity: Sufficiently Active (09/26/2021)   Exercise Vital Sign    Days of Exercise per Week: 4 days    Minutes of Exercise per Session: 40 min  Social Connections: Not on file  Stress: No Stress Concern Present (09/26/2021)   [place]    Feeling of Stress : Not at all  Tobacco Use: Medium Risk (11/07/2021)   Patient History    Smoking Tobacco Use: Former    Smokeless Tobacco Use: Never    Passive Exposure: Not on file  Transportation Needs: No Transportation Needs (09/26/2021)   PRAPARE - Hydrologist (Medical): No    Lack of Transportation (Non-Medical): No    CCM Care Plan  Allergies  Allergen Reactions   Bee Venom    Fish Allergy Hives and Swelling   Ivp Dye [Iodinated Contrast Media] Swelling    Medications Reviewed Today     Reviewed by Lynn Ito, [place] (Certified Medical Assistant) on 11/07/21 at 1144  Med List Status: <None>   Medication Order Taking? Sig Documenting Provider Last Dose Status Informant  allopurinol (ZYLOPRIM) 300 MG tablet [id number] Yes Take 1 tablet (300 mg total) by mouth daily. [name], FNP Taking Active   amLODipine (NORVASC) 10 MG tablet [id number] Yes Take 1 tablet (10 mg total) by mouth daily. [name], MD Taking Active   aspirin EC 81 MG tablet [id number] Yes Take 81 mg by mouth daily. [provider] Taking Active Self  diphenhydrAMINE (BENADRYL) 50 MG tablet [id number] Yes Take 1 tablet (50 mg total) by mouth as needed (once per cath instructions). Herminio Commons, MD Taking Active Self  ezetimibe (ZETIA) 10 MG tablet [id number] Yes Take 1 tablet (10 mg total) by mouth  daily. Verta Ellen., NP Taking Active   fluticasone [facility]) 50 MCG/ACT nasal spray [id number] Yes Place 2 sprays into both nostrils daily. [provider] Taking Active   glucose blood test strip [id number] Yes 1 each by Other route as needed for other. Use as instructed [name], FNP Taking Active   Insulin Pen Needle 32G X 6 MM MISC [id number] Yes Use with ozempic [name], FNP Taking Active  isosorbide mononitrate (IMDUR) 60 MG 24 hr tablet [id number] Yes Take 1.5 tablets (90 mg total) by mouth daily. [name], MD Taking Active   linaclotide Rolan Lipa) 72 MCG capsule [id number] Yes Take 72 mcg by mouth every other day. [provider] Taking Active Self  losartan (COZAAR) 100 MG tablet [id number] Yes Take 1 tablet (100 mg total) by mouth daily. [name], FNP Taking Active   melatonin 3 MG TABS tablet [id number] Yes Take 1 tablet (3 mg total) by mouth at bedtime. [name], FNP Taking Active   metFORMIN (GLUCOPHAGE) 500 MG tablet [id number] Yes Take 1 tablet (500 mg total) by mouth daily with breakfast. [name], FNP Taking Active   metoprolol succinate (TOPROL-XL) 50 MG 24 hr tablet [id number] Yes Take 1 tablet (50 mg total) by mouth daily. [name], MD Taking Active   nitroGLYCERIN (NITROSTAT) 0.4 MG SL tablet [id number] Yes DISSOLVE ONE TABLET UNDER THE TONGUE EVERY 5 MINUTES AS NEEDED FOR CHEST PAIN.&nbsp;&nbsp;DO NOT EXCEED A TOTAL OF 3 DOSES IN 15 MINUTES [name], FNP Taking Active   oxymetazoline (AFRIN) 0.05 % nasal spray [id number] Yes Place 1 spray into both nostrils 2 (two) times daily as needed for congestion. [provider] Taking Active            Med Note Doyce Loose Jul 29, 2017  6:19 PM)    rosuvastatin (CRESTOR) 5 MG tablet [id number] Yes TAKE ONE TABLET BY MOUTH ON MONDAY, WEDNESDAY, AND FRIDAY Verta Ellen., NP Taking Active   Semaglutide, 1 MG/DOSE, (OZEMPIC, 1 MG/DOSE,) 4 MG/3ML SOPN  [id number] Yes Inject 1 mg into the skin once a week. [name], FNP Taking Active             Patient Active Problem List   Diagnosis Date Noted   Seborrheic keratosis 05/20/2021   Coronary artery disease involving native coronary artery of native heart with angina pectoris ([place]) 06/05/2020   Nausea without vomiting 10/25/2019   Chronic constipation 07/07/2019   History of colonic polyps 07/07/2019   Unstable angina ([place]) 07/29/2017   Type 2 diabetes mellitus ([place]) 05/27/2017   Bilateral lower extremity edema    SOB (shortness of breath)    Family hx of colon cancer 09/18/2015   Gout 09/18/2015   Rectal bleeding 04/05/2012   GERD (gastroesophageal reflux disease) 04/05/2012   Atypical chest pain 12/08/2011   Abnormal EKG 12/08/2011   HTN (hypertension) 12/08/2011    Immunization History  Administered Date(s) Administered   Fluad Quad(high Dose 65+) 02/27/2021   Influenza, High Dose Seasonal PF 05/27/2017   Influenza-Unspecified 03/14/2020   Moderna Sars-Covid-2 Vaccination 07/28/2019, 08/26/2019, 06/05/2020   PNEUMOCOCCAL CONJUGATE-20 09/05/2020   Tdap 09/05/2020   Zoster Recombinat (Shingrix) 12/20/2020, 02/19/2021    Conditions to be addressed/monitored:  Hypertension and Diabetes  There are no care plans that you recently modified to display for this patient.    Medication Assistance: None required.  Patient affirms current coverage meets needs.  Compliance/Adherence/Medication fill history: Care Gaps: Ophthalmology Eye Exam  Star-Rating Drugs: Rosuvastatin 5 mg tablet Monday,   Patient's preferred pharmacy is:  [facility] [address] [place] [address] Phone: [phone] Fax: [phone]  Uses pill box? {Yes or If no, why not?:20788} Pt endorses ***% compliance  We discussed: {Pharmacy options:24294} Patient decided to: {US Pharmacy ZCHY:85027}  Care Plan and Follow Up Patient Decision:   {FOLLOWUP:24991}  Plan: {CM  FOLLOW UP PLAN:25073}  ***

## 2021-11-26 ENCOUNTER — Telehealth: Payer: Self-pay

## 2021-11-26 MED ORDER — METFORMIN HCL 500 MG PO TABS: 500.0000 mg | ORAL_TABLET | Freq: Two times a day (BID) | ORAL | 3 refills | Status: DC

## 2021-11-26 NOTE — Telephone Encounter (Signed)
Mr. Loadholt reports that Metformin extended release did not work well with his stomach. He had severe nausea and indigestion. However he has since started taking Metformin 500 mg tablet twice per day and has not had an issue.  Cherylin Mylar, CPP, PharmD Clinical Pharmacist Practitioner [facility] [phone]

## 2021-11-26 NOTE — Chronic Care Management (AMB) (Signed)
Novo Nordisk patient assistance program notification:  120- day supply of Ozempic 0.25/0.5 mg will be filled on 12/05/2021 and should arrive to the office in 10-14 business days. Patientenrollment will expire on 05/01/2022.  Pattricia Boss, [place] Pharmacist Assistant [phone]

## 2021-11-27 ENCOUNTER — Other Ambulatory Visit: Payer: Self-pay

## 2021-11-27 MED ORDER — METFORMIN HCL 500 MG PO TABS: 500.0000 mg | ORAL_TABLET | Freq: Two times a day (BID) | ORAL | 3 refills | Status: DC

## 2021-11-29 DIAGNOSIS — E1159 Type 2 diabetes mellitus with other circulatory complications: Secondary | ICD-10-CM

## 2021-11-29 DIAGNOSIS — I10 Essential (primary) hypertension: Secondary | ICD-10-CM | POA: Diagnosis not present

## 2021-12-02 ENCOUNTER — Telehealth: Payer: Self-pay

## 2021-12-02 NOTE — Chronic Care Management (AMB) (Signed)
Chronic Care Management Pharmacy Assistant   Name: Ronald Faulkner  MRN: [id number] DOB: 08/31/1950  Reason for Encounter: Disease State/ Diabetes  Recent office visits:  None  Recent consult visits:  None  Hospital visits:  None in previous 6 months  Medications: Outpatient Encounter Medications as of 12/02/2021  Medication Sig   allopurinol (ZYLOPRIM) 300 MG tablet Take 1 tablet (300 mg total) by mouth daily.   amLODipine (NORVASC) 10 MG tablet Take 1 tablet (10 mg total) by mouth daily.   aspirin EC 81 MG tablet Take 81 mg by mouth daily.   ezetimibe (ZETIA) 10 MG tablet Take 1 tablet (10 mg total) by mouth daily.   fluticasone (FLONASE) 50 MCG/ACT nasal spray Place 2 sprays into both nostrils daily.   glucose blood test strip 1 each by Other route as needed for other. Use as instructed   Insulin Pen Needle 32G X 6 MM MISC Use with ozempic   isosorbide mononitrate (IMDUR) 60 MG 24 hr tablet Take 1.5 tablets (90 mg total) by mouth daily.   linaclotide (LINZESS) 72 MCG capsule Take 72 mcg by mouth every other day.   losartan (COZAAR) 100 MG tablet Take 1 tablet (100 mg total) by mouth daily.   melatonin 3 MG TABS tablet Take 1 tablet (3 mg total) by mouth at bedtime.   metFORMIN (GLUCOPHAGE) 500 MG tablet Take 1 tablet (500 mg total) by mouth 2 (two) times daily with a meal.   metoprolol succinate (TOPROL-XL) 50 MG 24 hr tablet Take 1 tablet (50 mg total) by mouth daily.   nitroGLYCERIN (NITROSTAT) 0.4 MG SL tablet DISSOLVE ONE TABLET UNDER THE TONGUE EVERY 5 MINUTES AS NEEDED FOR CHEST PAIN.&nbsp;&nbsp;DO NOT EXCEED A TOTAL OF 3 DOSES IN 15 MINUTES   oxymetazoline (AFRIN) 0.05 % nasal spray Place 1 spray into both nostrils 2 (two) times daily as needed for congestion.   rosuvastatin (CRESTOR) 5 MG tablet TAKE ONE TABLET BY MOUTH ON MONDAY, WEDNESDAY, AND FRIDAY   Semaglutide, 1 MG/DOSE, (OZEMPIC, 1 MG/DOSE,) 4 MG/3ML SOPN Inject 1 mg into the skin once a week.   No  facility-administered encounter medications on file as of 12/02/2021.  Recent Relevant Labs: Lab Results  Component Value Date/Time   HGBA1C 7.0 (H) 09/12/2021 03:46 PM   HGBA1C 7.2 (H) 07/02/2021 09:48 AM   MICROALBUR 150 09/05/2020 03:51 PM    Kidney Function Lab Results  Component Value Date/Time   CREATININE 0.97 09/12/2021 03:46 PM   CREATININE 0.87 07/02/2021 09:48 AM   GFRNONAA 81 06/05/2020 03:35 PM   GFRAA 93 06/05/2020 03:35 PM    Current antihyperglycemic regimen:  Metformin 500 mg twice daily ([facility] had medication on hold due to pharmacy concerns. Informed [facility] that extended release is to be discontinued and to fill regular metformin)  Ozempic 0.5 mg weekly (Patient has 3 boxes of 0.5 mg but is to increase to 1 mg. Sent Felicity Coyer a message to follow up on reorder form on Wednesday)  What recent interventions/DTPs have been made to improve glycemic control:  Educated on A1c and blood sugar goals; Complications of diabetes including kidney damage, retinal damage, and cardiovascular disease; -Counseled to check feet daily and get yearly eye exams -Recommended to continue current medication  Have there been any recent hospitalizations or ED visits since last visit with CPP? No  Patient denies hypoglycemic symptoms  Patient denies hyperglycemic symptoms  How often are you checking your blood sugar? once daily  What are your blood  sugars ranging?  Fasting: 121, 123, 129 Before meals: None After meals: None Bedtime: None  During the week, how often does your blood glucose drop below 70? Never  Are you checking your feet daily/regularly? Daily  Adherence Review: Is the patient currently on a STATIN medication? Yes Is the patient currently on ACE/ARB medication? Yes Does the patient have >5 day gap between last estimated fill dates? Yes  Care Gaps: Covid booster overdue Yearly foot exam overdue  Star Rating Drugs: Rosuvastatin 5 mg- Last filled 08-22-2021 84  DS [facility] ([facility] stated refills are needed. Patient has about 10 pills left. Sent refill request to [facility]) Losartan 100 mg- Last filled 09-24-2021 90 DS [facility] Metformin 500 mg- Last filled 11-20-2021 90 DS [facility]  Ozempic 0.5 mg- Patient assistance   [place] Pharmacist Assistant [phone]

## 2021-12-04 ENCOUNTER — Telehealth: Payer: Self-pay

## 2021-12-04 MED ORDER — ROSUVASTATIN CALCIUM 10 MG PO TABS: 10.0000 mg | ORAL_TABLET | ORAL | 1 refills | Status: DC

## 2021-12-04 NOTE — Telephone Encounter (Signed)
Reviewed patients chart increased patients Rosuvastatin to 10 mg tablet Monday, Wednesday and Friday. Spoke with patient who agreed.

## 2021-12-04 NOTE — Chronic Care Management (AMB) (Signed)
Faxing reorder/change request form for Ozempic 1 mg to [employer] patient assistance program.  Pattricia Boss, [place] Pharmacist Assistant [phone]

## 2021-12-05 ENCOUNTER — Telehealth: Payer: Self-pay | Admitting: *Deleted

## 2021-12-05 DIAGNOSIS — R0602 Shortness of breath: Secondary | ICD-10-CM

## 2021-12-05 DIAGNOSIS — R942 Abnormal results of pulmonary function studies: Secondary | ICD-10-CM

## 2021-12-05 NOTE — Telephone Encounter (Signed)
Laurine Blazer, LPN  07/05/4142  3:60 PM EDT Back to Top    Notified, copy to pcp.  Agrees to pulmonary referral.

## 2021-12-05 NOTE — Telephone Encounter (Signed)
-----   Message from [name], MD sent at 12/04/2021  4:28 PM EDT ----- Abnormal PFTs, can we refer to pulmonary please for shortness of breath  Zandra Abts MD

## 2021-12-18 ENCOUNTER — Telehealth: Payer: Self-pay

## 2021-12-18 NOTE — Chronic Care Management (AMB) (Signed)
Novo Nordisk patient assistance program notification:  120- day supply of Ozempic 0.25/0.5 mg was filled on 12/09/2021 and should arrive to the office in 10-14 business days. Patient has 1  refill remaining and enrollment will expire on 05/01/2022.  The next refill for patient will be fulfilled on 02/23/2022.  Pattricia Boss, [place] Pharmacist Assistant [phone]

## 2022-01-01 ENCOUNTER — Ambulatory Visit (INDEPENDENT_AMBULATORY_CARE_PROVIDER_SITE_OTHER): Payer: Medicare HMO | Admitting: Nurse Practitioner

## 2022-01-01 ENCOUNTER — Encounter: Payer: Self-pay | Admitting: Nurse Practitioner

## 2022-01-01 VITALS — BP 122/60 | HR 77 | Temp 97.8°F | Ht 67.0 in | Wt 159.0 lb

## 2022-01-01 DIAGNOSIS — I119 Hypertensive heart disease without heart failure: Secondary | ICD-10-CM

## 2022-01-01 DIAGNOSIS — Z0000 Encounter for general adult medical examination without abnormal findings: Secondary | ICD-10-CM | POA: Diagnosis not present

## 2022-01-01 DIAGNOSIS — K5904 Chronic idiopathic constipation: Secondary | ICD-10-CM

## 2022-01-01 DIAGNOSIS — Z8546 Personal history of malignant neoplasm of prostate: Secondary | ICD-10-CM

## 2022-01-01 DIAGNOSIS — E1159 Type 2 diabetes mellitus with other circulatory complications: Secondary | ICD-10-CM

## 2022-01-01 DIAGNOSIS — Z9079 Acquired absence of other genital organ(s): Secondary | ICD-10-CM

## 2022-01-01 DIAGNOSIS — Z79899 Other long term (current) drug therapy: Secondary | ICD-10-CM

## 2022-01-01 NOTE — Progress Notes (Signed)
I,Tianna Badgett,acting as a Education administrator for [employer], FNP.,have documented all relevant documentation on the behalf of [name], FNP,as directed by  [name], FNP while in the presence of [name], [name].  Subjective:     Patient ID: Ronald Faulkner , male    DOB: 1951/05/13 , 71 y.o.   MRN: [id number]   Chief Complaint  Patient presents with   Annual Exam    HPI  Pt presents today for HM. He has to see the pulmonologist on 8/18  Diabetes He presents for his follow-up diabetic visit. He has type 2 diabetes mellitus. No MedicAlert identification noted. His disease course has been improving. Pertinent negatives for hypoglycemia include no dizziness or headaches. There are no diabetic associated symptoms. There are no diabetic complications. Risk factors for coronary artery disease include diabetes mellitus, male sex, sedentary lifestyle and hypertension. Current diabetic treatment includes oral agent (dual therapy) (Ozempic and metformin). He is following a diabetic diet. When asked about meal planning, he reported none. He has not had a previous visit with a dietitian. He rarely participates in exercise. (Blood sugar has been as low at 100-145. Average stays low) An ACE inhibitor/angiotensin II receptor blocker is being taken. Eye exam current: 08/22/2020.  Hypertension This is a chronic problem. The current episode started 1 to 4 weeks ago. The problem has been gradually worsening since onset. The problem is uncontrolled. Pertinent negatives include no headaches or peripheral edema. There are no associated agents to hypertension. Risk factors for coronary artery disease include sedentary lifestyle. Past treatments include angiotensin blockers, calcium channel blockers and direct vasodilators. The current treatment provides moderate improvement. There are no compliance problems.  There is no history of angina. There is no history of chronic renal disease.     Past Medical History:   Diagnosis Date   Arthritis    CAD (coronary artery disease)    a. 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 Tarri Quentez on 05/08/2021 -Pigmented   Unstable angina ([place]) 07/2017     Family History  Problem Relation Age of Onset   Colon cancer Mother    Coronary artery disease Maternal Grandmother    Anesthesia problems Neg Hx    Hypotension Neg Hx    Malignant hyperthermia Neg Hx    Pseudochol deficiency Neg Hx      Current Outpatient Medications:    allopurinol (ZYLOPRIM) 300 MG tablet, Take 1 tablet by mouth once daily, Disp: 90 tablet, Rfl: 0   amLODipine (NORVASC) 10 MG tablet, Take 1 tablet (10 mg total) by mouth daily., Disp: 90 tablet, Rfl: 1   aspirin EC 81 MG tablet, Take 81 mg by mouth daily., Disp: , Rfl:    ezetimibe (ZETIA) 10 MG tablet, Take 1 tablet (10 mg total) by mouth daily., Disp: 90 tablet, Rfl: 2   fluticasone (FLONASE) 50 MCG/ACT nasal spray, Place 2 sprays into both nostrils daily., Disp: , Rfl:    glucose blood test strip, 1 each by Other route as needed for other.  Use as instructed, Disp: 100 each, Rfl: 6   Insulin Pen Needle 32G X 6 MM MISC, Use with ozempic, Disp: 50 each, Rfl: 3   isosorbide mononitrate (IMDUR) 60 MG 24 hr tablet, Take 1.5 tablets (90 mg total) by mouth daily., Disp: 135 tablet, Rfl: 1   linaclotide (LINZESS) 72 MCG capsule, Take 72 mcg by mouth every other day., Disp: , Rfl:    losartan (COZAAR) 100 MG tablet, Take 1 tablet (100 mg total) by mouth daily., Disp: 90 tablet, Rfl: 0   melatonin 3 MG TABS tablet, Take 1 tablet (3 mg total) by  mouth at bedtime., Disp: 14 tablet, Rfl: 0   metFORMIN (GLUCOPHAGE) 500 MG tablet, Take 1 tablet (500 mg total) by mouth 2 (two) times daily with a meal., Disp: 180 tablet, Rfl: 3   metoprolol succinate (TOPROL-XL) 50 MG 24 hr tablet, Take 1 tablet (50 mg total) by mouth daily., Disp: 90 tablet, Rfl: 1   nitroGLYCERIN (NITROSTAT) 0.4 MG SL tablet, DISSOLVE ONE TABLET UNDER THE TONGUE EVERY 5 MINUTES AS NEEDED FOR CHEST PAIN.&nbsp;&nbsp;DO NOT EXCEED A TOTAL OF 3 DOSES IN 15 MINUTES, Disp: 25 tablet, Rfl: 0   oxymetazoline (AFRIN) 0.05 % nasal spray, Place 1 spray into both nostrils 2 (two) times daily as needed for congestion., Disp: , Rfl:    rosuvastatin (CRESTOR) 10 MG tablet, Take 1 tablet (10 mg total) by mouth every Monday, Wednesday, and Friday., Disp: 36 tablet, Rfl: 1   Semaglutide, 1 MG/DOSE, (OZEMPIC, 1 MG/DOSE,) 4 MG/3ML SOPN, Inject 1 mg into the skin once a week., Disp: 9 mL, Rfl: 1   Allergies  Allergen Reactions   Bee Venom    Fish Allergy Hives and Swelling   Ivp Dye [Iodinated Contrast Media] Swelling     Men's preventive visit. Patient Health Questionnaire (PHQ-2) is  [facility] Office Visit from 01/01/2022 in [facility]  PHQ-2 Total Score 0      Patient is on a regular diet. Exercise moderate with walking at work and outside of work 3 days a week.  Marital status: Married. Relevant history for alcohol use is:  Social History   Substance and Sexual Activity  Alcohol Use Yes   Comment: occ   Relevant history for tobacco use is:  Social History   Tobacco Use  Smoking Status Former   Packs/day: 1.00   Years: 35.00   Total pack years: 35.00   Types: Cigarettes   Quit date: 06/03/1999   Years since quitting: 22.6  Smokeless Tobacco Never  .   Review of Systems  Constitutional: Negative.   HENT: Negative.    Eyes: Negative.   Respiratory: Negative.    Cardiovascular: Negative.   Gastrointestinal: Negative.   Endocrine: Negative.    Genitourinary: Negative.   Musculoskeletal: Negative.   Skin: Negative.   Allergic/Immunologic: Negative.   Neurological: Negative.  Negative for dizziness and headaches.  Hematological: Negative.   Psychiatric/Behavioral: Negative.       Today's Vitals   01/01/22 1043  BP: 122/60  Pulse: 77  Temp: 97.8 F (36.6 C)  TempSrc: Oral  Weight: 159 lb (72.1 kg)  Height: '5\' 7"'  (1.702 m)   Body mass index is 24.9 kg/m.  Wt Readings from Last 3 Encounters:  01/01/22 159 lb (72.1 kg)  11/07/21 162 lb 9.6 oz (73.8 kg)  09/26/21 161 lb (73 kg)    Objective:  Physical Exam Vitals reviewed.  Constitutional:      General: He is not  in acute distress.    Appearance: Normal appearance.  HENT:     Head: Normocephalic and atraumatic.     Right Ear: Tympanic membrane, ear canal and external ear normal. There is no impacted cerumen.     Left Ear: Tympanic membrane, ear canal and external ear normal. There is no impacted cerumen.     Nose: Nose normal.     Mouth/Throat:     Mouth: Mucous membranes are moist.  Eyes:     Pupils: Pupils are equal, round, and reactive to light.  Cardiovascular:     Rate and Rhythm: Normal rate and regular rhythm.     Pulses: Normal pulses.     Heart sounds: Normal heart sounds. No murmur heard. Pulmonary:     Effort: Pulmonary effort is normal. No respiratory distress.     Breath sounds: Normal breath sounds. No wheezing.  Abdominal:     General: Abdomen is flat. Bowel sounds are normal. There is no distension.     Palpations: Abdomen is soft.  Genitourinary:    Comments: Prostatectomy Musculoskeletal:        General: Normal range of motion.     Cervical back: Normal range of motion and neck supple.  Skin:    General: Skin is warm.     Capillary Refill: Capillary refill takes less than 2 seconds.  Neurological:     General: No focal deficit present.     Mental Status: He is alert and oriented to person, place, and time.     Cranial Nerves: No  cranial nerve deficit.     Motor: No weakness.  Psychiatric:        Mood and Affect: Mood normal.        Behavior: Behavior normal.        Thought Content: Thought content normal.        Judgment: Judgment normal.         Assessment And Plan:    1. Routine general medical examination at health care facility Behavior modifications discussed and diet history reviewed.   Pt will continue to exercise regularly and modify diet with low GI, plant based foods and decrease intake of processed foods.  Recommend intake of daily multivitamin, Vitamin D, and calcium.  Recommend colonoscopy for preventive screenings, as well as recommend immunizations that include influenza, TDAP, and Shingles (up to date)  2. Type 2 diabetes mellitus with other circulatory complication, without long-term current use of insulin (HCC) Comments: Advised to have his wife to check his feet every other day, diabetic foot exam done, HgbA1c is improving continue current medications  - Hemoglobin A1c - Lipid panel - Renal function panel with eGFR - Microalbumin / Creatinine Urine Ratio  3. Hypertensive heart disease without heart failure Comments: Blood pressure is well controlled, continue current medications and follow up with Cardiology  4. History of prostate cancer - PSA  5. H/O prostatectomy - PSA  6. Other long term (current) drug therapy - CBC  7. Chronic idiopathic constipation Comments: He should take the 72 mg dose daily vs as needed for better management     Patient was given opportunity to ask questions. Patient verbalized understanding of the plan and was able to repeat key elements of the plan. All questions were answered to their satisfaction.   [name], FNP   I, [name], FNP, have reviewed all documentation for this visit. The documentation on 01/01/22 for the exam, diagnosis, procedures, and orders are all accurate and complete.  THE PATIENT IS ENCOURAGED TO PRACTICE SOCIAL  DISTANCING DUE TO THE COVID-19 PANDEMIC.

## 2022-01-01 NOTE — Patient Instructions (Addendum)
Health Maintenance, Male Adopting a healthy lifestyle and getting preventive care are important in promoting health and wellness. Ask your health care provider about: The right schedule for you to have regular tests and exams. Things you can do on your own to prevent diseases and keep yourself healthy. What should I know about diet, weight, and exercise? Eat a healthy diet  Eat a diet that includes plenty of vegetables, fruits, low-fat dairy products, and lean protein. Do not eat a lot of foods that are high in solid fats, added sugars, or sodium. Maintain a healthy weight Body mass index (BMI) is a measurement that can be used to identify possible weight problems. It estimates body fat based on height and weight. Your health care provider can help determine your BMI and help you achieve or maintain a healthy weight. Get regular exercise Get regular exercise. This is one of the most important things you can do for your health. Most adults should: Exercise for at least 150 minutes each week. The exercise should increase your heart rate and make you sweat (moderate-intensity exercise). Do strengthening exercises at least twice a week. This is in addition to the moderate-intensity exercise. Spend less time sitting. Even light physical activity can be beneficial. Watch cholesterol and blood lipids Have your blood tested for lipids and cholesterol at 72 years of age, then have this test every 5 years. You may need to have your cholesterol levels checked more often if: Your lipid or cholesterol levels are high. You are older than 72 years of age. You are at high risk for heart disease. What should I know about cancer screening? Many types of cancers can be detected early and may often be prevented. Depending on your health history and family history, you may need to have cancer screening at various ages. This may include screening for: Colorectal cancer. Prostate cancer. Skin cancer. Lung  cancer. What should I know about heart disease, diabetes, and high blood pressure? Blood pressure and heart disease High blood pressure causes heart disease and increases the risk of stroke. This is more likely to develop in people who have high blood pressure readings or are overweight. Talk with your health care provider about your target blood pressure readings. Have your blood pressure checked: Every 3-5 years if you are 18-39 years of age. Every year if you are 40 years old or older. If you are between the ages of 65 and 75 and are a current or former smoker, ask your health care provider if you should have a one-time screening for abdominal aortic aneurysm (AAA). Diabetes Have regular diabetes screenings. This checks your fasting blood sugar level. Have the screening done: Once every three years after age 45 if you are at a normal weight and have a low risk for diabetes. More often and at a younger age if you are overweight or have a high risk for diabetes. What should I know about preventing infection? Hepatitis B If you have a higher risk for hepatitis B, you should be screened for this virus. Talk with your health care provider to find out if you are at risk for hepatitis B infection. Hepatitis C Blood testing is recommended for: Everyone born from 1945 through 1965. Anyone with known risk factors for hepatitis C. Sexually transmitted infections (STIs) You should be screened each year for STIs, including gonorrhea and chlamydia, if: You are sexually active and are younger than 72 years of age. You are older than 72 years of age and your   health care provider tells you that you are at risk for this type of infection. Your sexual activity has changed since you were last screened, and you are at increased risk for chlamydia or gonorrhea. Ask your health care provider if you are at risk. Ask your health care provider about whether you are at high risk for HIV. Your health care provider  may recommend a prescription medicine to help prevent HIV infection. If you choose to take medicine to prevent HIV, you should first get tested for HIV. You should then be tested every 3 months for as long as you are taking the medicine. Follow these instructions at home: Alcohol use Do not drink alcohol if your health care provider tells you not to drink. If you drink alcohol: Limit how much you have to 0-2 drinks a day. Know how much alcohol is in your drink. In the [place] one drink equals one 12 oz bottle of beer (355 mL), one 5 oz glass of wine (148 mL), or one 1 oz glass of hard liquor (44 mL). Lifestyle Do not use any products that contain nicotine or tobacco. These products include cigarettes, chewing tobacco, and vaping devices, such as e-cigarettes. If you need help quitting, ask your health care provider. Do not use street drugs. Do not share needles. Ask your health care provider for help if you need support or information about quitting drugs. General instructions Schedule regular health, dental, and eye exams. Stay current with your vaccines. Tell your health care provider if: You often feel depressed. You have ever been abused or do not feel safe at home. Summary Adopting a healthy lifestyle and getting preventive care are important in promoting health and wellness. Follow your health care provider's instructions about healthy diet, exercising, and getting tested or screened for diseases. Follow your health care provider's instructions on monitoring your cholesterol and blood pressure. This information is not intended to replace advice given to you by your health care provider. Make sure you discuss any questions you have with your health care provider. Document Revised: 10/08/2020 Document Reviewed: 10/08/2020 Elsevier Patient Education  [place]   [phone]

## 2022-01-02 LAB — RENAL FUNCTION PANEL
Albumin: 4.4 g/dL (ref 3.8–4.8)
BUN/Creatinine Ratio: 14 (ref 10–24)
BUN: 13 mg/dL (ref 8–27)
CO2: 19 mmol/L — ABNORMAL LOW (ref 20–29)
Calcium: 9.8 mg/dL (ref 8.6–10.2)
Chloride: 101 mmol/L (ref 96–106)
Creatinine, Ser: 0.91 mg/dL (ref 0.76–1.27)
Glucose: 103 mg/dL — ABNORMAL HIGH (ref 70–99)
Phosphorus: 3.2 mg/dL (ref 2.8–4.1)
Potassium: 4.1 mmol/L (ref 3.5–5.2)
Sodium: 138 mmol/L (ref 134–144)
eGFR: 90 mL/min/{1.73_m2} (ref >59)

## 2022-01-02 LAB — CBC
Hematocrit: 41.9 % (ref 37.5–51.0)
Hemoglobin: 13.7 g/dL (ref 13.0–17.7)
MCH: 27.1 pg (ref 26.6–33.0)
MCHC: 32.7 g/dL (ref 31.5–35.7)
MCV: 83 fL (ref 79–97)
Platelets: 209 10*3/uL (ref 150–450)
RBC: 5.06 x10E6/uL (ref 4.14–5.80)
RDW: 15.5 % — ABNORMAL HIGH (ref 11.6–15.4)
WBC: 6 10*3/uL (ref 3.4–10.8)

## 2022-01-02 LAB — LIPID PANEL
Chol/HDL Ratio: 2.4 ratio (ref 0.0–5.0)
Cholesterol, Total: 119 mg/dL (ref 100–199)
HDL: 49 mg/dL (ref >39)
LDL Chol Calc (NIH): 51 mg/dL (ref 0–99)
Triglycerides: 103 mg/dL (ref 0–149)
VLDL Cholesterol Cal: 19 mg/dL (ref 5–40)

## 2022-01-02 LAB — PSA: Prostate Specific Ag, Serum: <0.1 ng/mL (ref 0.0–4.0)

## 2022-01-02 LAB — MICROALBUMIN / CREATININE URINE RATIO
Creatinine, Urine: 131.8 mg/dL
Microalb/Creat Ratio: 108 mg/g creat — ABNORMAL HIGH (ref 0–29)
Microalbumin, Urine: 142.5 ug/mL

## 2022-01-02 LAB — HEMOGLOBIN A1C
Est. average glucose Bld gHb Est-mCnc: 146 mg/dL
Hgb A1c MFr Bld: 6.7 % — ABNORMAL HIGH (ref 4.8–5.6)

## 2022-01-05 ENCOUNTER — Other Ambulatory Visit: Payer: Self-pay | Admitting: Nurse Practitioner

## 2022-01-15 ENCOUNTER — Telehealth: Payer: Self-pay

## 2022-01-15 NOTE — Chronic Care Management (AMB) (Signed)
01-15-2022: Left patient a VM that [place] arrived and is ready for pick up for anyday except Friday.  [place] Pharmacist Assistant [phone]

## 2022-01-17 ENCOUNTER — Ambulatory Visit: Payer: Medicare HMO | Admitting: Internal Medicine

## 2022-01-17 ENCOUNTER — Encounter: Payer: Self-pay | Admitting: Internal Medicine

## 2022-01-17 ENCOUNTER — Ambulatory Visit (HOSPITAL_COMMUNITY)
Admission: RE | Admit: 2022-01-17 | Discharge: 2022-01-17 | Disposition: A | Discharge status: Home / Self Care | Payer: Medicare HMO | Source: Ambulatory Visit | Attending: Internal Medicine | Admitting: Internal Medicine

## 2022-01-17 DIAGNOSIS — I10 Essential (primary) hypertension: Secondary | ICD-10-CM | POA: Diagnosis not present

## 2022-01-17 DIAGNOSIS — R0609 Other forms of dyspnea: Secondary | ICD-10-CM

## 2022-01-17 MED ORDER — VALSARTAN 160 MG PO TABS: 160.0000 mg | ORAL_TABLET | Freq: Every day | ORAL | 11 refills | Status: DC

## 2022-01-17 MED ORDER — FAMOTIDINE 20 MG PO TABS: ORAL_TABLET | ORAL | 11 refills | Status: DC

## 2022-01-17 MED ORDER — PANTOPRAZOLE SODIUM 40 MG PO TBEC: 40.0000 mg | DELAYED_RELEASE_TABLET | Freq: Every day | ORAL | 2 refills | Status: DC

## 2022-01-17 NOTE — Assessment & Plan Note (Addendum)
Changed losartan to valsartan 160 mg one daily 01/17/2022 due to pseudohweeze on exam   Comment:  For reasons that may related to vascular permability and nitric oxide pathways but not elevated  bradykinin levels (as seen with  ACEi use) losartan in the generic form has been reported now from mulitple sources  to cause a similar pattern of non-specific  upper airway symptoms as seen with acei.   This has not been reported with exposure to the other ARB's to date, so it seems reasonable for now to try either generic diovan or avapro if ARB needed or use an alternative class altogether.  See:  Lelon Frohlich Allergy Asthma Immunol  2008: 101: p [phone]     F/u 6 weeks   Each maintenance medication was reviewed in detail including emphasizing most importantly the difference between maintenance and prns and under what circumstances the prns are to be triggered using an action plan format where appropriate.  Total time for H and P, chart review, counseling,  directly observing portions of ambulatory 02 saturation study/ and generating customized AVS unique to this office visit / same day charting = 60 min with pt new to me

## 2022-01-17 NOTE — Assessment & Plan Note (Addendum)
Onset 2022 with raspy pseudowheeze on exam 01/17/2022  - Echo 1/0/31  Grad 1 diastolic dysfunction with trivial MR/ nl LA - PFT's  11/14/21   FEV1 2.47 (97 % ) ratio 0.82  p 8 % improvement from saba p ? prior to study with DLCO  13.62  (58%) and FV curve nl   -  01/17/2022   Walked on RA  x  3  lap(s) =  approx 450  ft  @ very fast pace, stopped due to end of study s sob  with lowest 02 sats 96%    When respiratory symptoms begin or become refractory well after a patient reports complete smoking cessation,  Especially when this wasn't the case while they were smoking, a red flag is raised based on the work of Dr Kris Mouton which states:  if you quit smoking when your best day FEV1 is still well preserved it is highly unlikely you will progress to severe disease.  That is to say, once the smoking stops,  the symptoms should not progress 25 years later.  If so, the differential diagnosis should include  obesity/deconditioning,  LPR/Reflux/Aspiration syndromes,  occult CHF(diastolic dysfunction/mr noted), or  especially side effect of medications commonly used in this population,esp ACEi though can see occasionally with generic losartan as well (see hbp)   rec Max gerd rx.diet Change losartan to valsartan Reg sub max ex x 30 m daily  F/u in 6 weeks with cpst the next step

## 2022-01-17 NOTE — Patient Instructions (Addendum)
Stop losartan and start valsartan 160 one daily in its place  Pantoprazole (protonix) 40 mg   Take  30-60 min before first meal of the day and Pepcid (famotidine)  20 mg after supper until return to office - this is the best way to tell whether stomach acid is contributing to your problem.    GERD (REFLUX)  is an extremely common cause of respiratory symptoms just like yours , many times with no obvious heartburn at all.    It can be treated with medication, but also with lifestyle changes including elevation of the head of your bed (ideally with 6 -8inch blocks under the headboard of your bed),  Smoking cessation, avoidance of late meals, excessive alcohol, and avoid fatty foods, chocolate, peppermint, colas, red wine, and acidic juices such as orange juice.  NO MINT OR MENTHOL PRODUCTS SO NO COUGH DROPS  USE SUGARLESS CANDY INSTEAD (Jolley ranchers or Stover's or Life Savers) or even ice chips will also do - the key is to swallow to prevent all throat clearing. NO OIL BASED VITAMINS - use powdered substitutes.  Avoid fish oil when coughing.    To get the most out of exercise, you need to be continuously aware that you are short of breath, but never out of breath, for at least 30 minutes daily. As you improve, it will actually be easier for you to do the same amount of exercise  in  30 minutes so always push to the level where you are mildly short of breath.       Please remember to go to the  x-ray department  @  [facility] for your tests - we will call you with the results when they are available     Please schedule a follow up office visit in 6 weeks, call sooner if needed

## 2022-01-17 NOTE — Progress Notes (Signed)
Ronald Faulkner, male    DOB: [birth year]    MRN: [id number]   Brief patient profile:  45  yobm  quit 1998  referred to pulmonary clinic in [place]  01/17/2022 by Dr Ronald Faulkner for doe progressive    Used to weld with welding helmet but stopped  around 1995   History of Present Illness  01/17/2022  Pulmonary/ 1st office eval/ Ronald Faulkner / Customer service manager Complaint  Patient presents with   Consult    Pt states he has been having some SOB with exertion x1 year  Dyspnea:  MMRC1 = can walk nl pace, flat grade, can't hurry or go uphills or steps s sob   Has trouble carrying objects  Has twinges of back "spasms" (x  few secs) x years mostly on R mid chest seem worse with exertion Cough: none  Sleep: 30 degrees due to back x  years  SABA use: never   No obvious day to day or daytime pattern/variability or assoc excess/ purulent sputum or mucus plugs or hemoptysis or  chest tightness, subjective wheeze or overt sinus or hb symptoms.   sleeping without nocturnal  or early am exacerbation  of respiratory  c/o's or need for noct saba. Also denies any obvious fluctuation of symptoms with weather or environmental changes or other aggravating or alleviating factors except as outlined above   No unusual exposure hx or h/o childhood pna/ asthma or knowledge of premature birth.  Current Allergies, Complete Past Medical History, Past Surgical History, Family History, and Social History were reviewed in [employer] record.  ROS  The following are not active complaints unless bolded Hoarseness, sore throat, dysphagia, dental problems, itching, sneezing,  nasal congestion or discharge of excess mucus or purulent secretions, ear ache,   fever, chills, sweats, unintended wt loss or wt gain, classically pleuritic or exertional cp,  orthopnea pnd or arm/hand swelling  or leg swelling, presyncope, palpitations, abdominal pain, anorexia, nausea, vomiting, diarrhea  or change in bowel habits  or change in bladder habits, change in stools or change in urine, dysuria, hematuria,  rash, arthralgias, visual complaints, headache, numbness, weakness or ataxia or problems with walking or coordination,  change in mood or  memory.           Past Medical History:  Diagnosis Date   Arthritis    CAD (coronary artery disease)    a. 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 Tarri Faulkner on 05/08/2021 -Pigmented   Unstable angina ([place]) 07/2017    Outpatient Medications Prior to Visit  Medication Sig Dispense Refill   allopurinol (ZYLOPRIM) 300 MG tablet Take 1 tablet by mouth once daily 90 tablet 0   amLODipine (NORVASC) 10 MG tablet Take 1 tablet (10 mg total) by mouth daily. 90 tablet 1   aspirin EC 81 MG tablet Take 81 mg by mouth daily.     ezetimibe (ZETIA) 10 MG tablet Take 1 tablet (10 mg total) by mouth daily. 90 tablet 2   fluticasone (  FLONASE) 50 MCG/ACT nasal spray Place 2 sprays into both nostrils daily.     glucose blood test strip 1 each by Other route as needed for other. Use as instructed 100 each 6   Insulin Pen Needle 32G X 6 MM MISC Use with ozempic 50 each 3   isosorbide mononitrate (IMDUR) 60 MG 24 hr tablet Take 1.5 tablets (90 mg total) by mouth daily. 135 tablet 1   linaclotide (LINZESS) 72 MCG capsule Take 72 mcg by mouth every other day.     losartan (COZAAR) 100 MG tablet Take 1 tablet (100 mg total) by mouth daily. 90 tablet 0   melatonin 3 MG TABS tablet Take 1 tablet (3 mg total) by mouth at bedtime. 14 tablet 0   metFORMIN (GLUCOPHAGE) 500 MG tablet Take 1  tablet (500 mg total) by mouth 2 (two) times daily with a meal. 180 tablet 3   metoprolol succinate (TOPROL-XL) 50 MG 24 hr tablet Take 1 tablet (50 mg total) by mouth daily. 90 tablet 1   nitroGLYCERIN (NITROSTAT) 0.4 MG SL tablet DISSOLVE ONE TABLET UNDER THE TONGUE EVERY 5 MINUTES AS NEEDED FOR CHEST PAIN.&nbsp;&nbsp;DO NOT EXCEED A TOTAL OF 3 DOSES IN 15 MINUTES 25 tablet 0   oxymetazoline (AFRIN) 0.05 % nasal spray Place 1 spray into both nostrils 2 (two) times daily as needed for congestion.     rosuvastatin (CRESTOR) 10 MG tablet Take 1 tablet (10 mg total) by mouth every Monday, Wednesday, and Friday. 36 tablet 1   Semaglutide, 1 MG/DOSE, (OZEMPIC, 1 MG/DOSE,) 4 MG/3ML SOPN Inject 1 mg into the skin once a week. 9 mL 1   No facility-administered medications prior to visit.     Objective:     BP 126/72 (BP Location: Left Arm, Patient Position: Sitting, Cuff Size: Normal)   Pulse 69   Temp (!) 97.4 F (36.3 C) (Oral)   Ht '5\' 7"'$  (1.702 m)   Wt 157 lb 6.4 oz (71.4 kg)   SpO2 97% Comment: on RA  BMI 24.65 kg/m   SpO2: 97 % (on RA)  Amb somber bm with classic pseudowheeze    HEENT : Oropharynx  clear/ poor lower dentition full upper plate     Nasal turbinates nl    NECK :  without  apparent JVD/ palpable Nodes/TM    LUNGS: no acc muscle use,  Nl contour chest which is clear to A and P bilaterally without cough on insp or exp maneuvers   CV:  RRR  no s3 or murmur or increase in P2, and no edema   ABD:  soft and nontender with nl inspiratory excursion in the supine position. No bruits or organomegaly appreciated   MS:  Nl gait/ ext warm without deformities Or obvious joint restrictions  calf tenderness, cyanosis or clubbing    SKIN: warm and dry without lesions    NEURO:  alert, approp, nl sensorium with  no motor or cerebellar deficits apparent.         Assessment   No problem-specific Assessment & Plan notes found for this encounter.     Ronald Gully,  MD 01/17/2022

## 2022-01-24 ENCOUNTER — Other Ambulatory Visit: Payer: Self-pay

## 2022-01-24 MED ORDER — EZETIMIBE 10 MG PO TABS: 10.0000 mg | ORAL_TABLET | Freq: Every day | ORAL | 1 refills | Status: DC

## 2022-02-04 NOTE — Telephone Encounter (Signed)
Open in error

## 2022-02-28 ENCOUNTER — Ambulatory Visit: Payer: Medicare HMO | Admitting: Internal Medicine

## 2022-02-28 ENCOUNTER — Telehealth: Payer: Self-pay

## 2022-02-28 ENCOUNTER — Encounter: Payer: Self-pay | Admitting: Internal Medicine

## 2022-02-28 VITALS — BP 122/68 | HR 68 | Temp 98.2°F | Ht 67.0 in | Wt 159.0 lb

## 2022-02-28 DIAGNOSIS — I10 Essential (primary) hypertension: Secondary | ICD-10-CM

## 2022-02-28 DIAGNOSIS — Z23 Encounter for immunization: Secondary | ICD-10-CM

## 2022-02-28 DIAGNOSIS — R0609 Other forms of dyspnea: Secondary | ICD-10-CM | POA: Diagnosis not present

## 2022-02-28 NOTE — Patient Instructions (Signed)
To get the most out of exercise, you need to be continuously aware that you are short of breath, but never out of breath, for at least 30 minutes daily. As you improve, it will actually be easier for you to do the same amount of exercise  in  30 minutes so always push to the level where you are short of breath.    No change in medications  Please schedule a follow up visit in 6  months but call sooner if needed

## 2022-02-28 NOTE — Assessment & Plan Note (Addendum)
Changed losartan to valsartan 160 mg one daily 01/17/2022 due to [place] on exam > resolved 02/28/2022   Although even in retrospect it may not be clear the losartan contributed to the pt's symptoms,  Pt improved off it  and adding   back at this point or in the future would risk confusion in interpretation of non-specific respiratory symptoms to which this patient is prone  ie  Better not to muddy the waters here.  >>> continue valsartan 160 mg daily and f/u with PCP           Each maintenance medication was reviewed in detail including emphasizing most importantly the difference between maintenance and prns and under what circumstances the prns are to be triggered using an action plan format where appropriate.  Total time for H and P, chart review, counseling,   and generating customized AVS unique to this office visit / same day charting = 32  min summary final regular f/u visit

## 2022-02-28 NOTE — Progress Notes (Signed)
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   History of Present Illness  01/17/2022  Pulmonary/ 1st office eval/ Samary Shatz / [facility]  Chief Complaint  Patient presents with   Consult    Pt states he has been having some SOB with exertion x1 year  Dyspnea:  MMRC1 = can walk nl pace, flat grade, can't hurry or go uphills or steps s sob   Has trouble carrying objects  Has twinges of back "spasms" (x  few secs) x years mostly on R mid chest seem worse with exertion Cough: none  Sleep: 30 degrees due to back x  years  SABA use: never  Rec Stop losartan and start valsartan 160 one daily in its place Pantoprazole (protonix) 40 mg   Take  30-60 min before first meal of the day and Pepcid (famotidine)  20 mg after supper until return to office - this is the best way to tell whether stomach acid is contributing to your problem.   GERD diet   To get the most out of exercise, you need to be continuously aware that you are short of breath, but never out of breath, for at least 30 minutes daily.  Please remember to go to the  x-ray department c/w copd         02/28/2022  f/u ov/[place] office/Christia Coaxum re: doe ? Etiology assoc pseudowheeze  maint on gerd rx  Chief Complaint  Patient presents with   Follow-up    Breathing has improved since last ov    Dyspnea:  walked with wife x one hour some hills no problem  Cough: none  Sleeping: 30 degrees due to back  SABA use: none  02: none  Covid status: vax x 4      No obvious day to day or daytime variability or assoc excess/ purulent sputum or mucus plugs or hemoptysis or cp or chest tightness, subjective wheeze or overt   hb symptoms.   Sleeping  without nocturnal  or early am exacerbation  of respiratory  c/o's or need  for noct saba. Also denies any obvious fluctuation of symptoms with weather or environmental changes or other aggravating or alleviating factors except as outlined above   No unusual exposure hx or h/o childhood pna/ asthma or knowledge of premature birth.  Current Allergies, Complete Past Medical History, Past Surgical History, Family History, and Social History were reviewed in [employer] record.  ROS  The following are not active complaints unless bolded Hoarseness, sore throat, dysphagia, dental problems, itching, sneezing,  nasal congestion or discharge of excess mucus or purulent secretions, ear ache,   fever, chills, sweats, unintended wt loss or wt gain, classically pleuritic or exertional cp,  orthopnea pnd or arm/hand swelling  or leg swelling, presyncope, palpitations, abdominal pain, anorexia, nausea, vomiting, diarrhea  or change in bowel habits or change in bladder habits, change in stools or change in urine, dysuria, hematuria,  rash, arthralgias, visual complaints, headache, numbness, weakness(legs get tired)  or ataxia or problems with walking or coordination,  change in mood or  memory.        Current Meds  Medication Sig   allopurinol (ZYLOPRIM) 300 MG tablet Take 1  tablet by mouth once daily   amLODipine (NORVASC) 10 MG tablet Take 1 tablet (10 mg total) by mouth daily.   aspirin EC 81 MG tablet Take 81 mg by mouth daily.   ezetimibe (ZETIA) 10 MG tablet Take 1 tablet (10 mg total) by mouth daily.   famotidine (PEPCID) 20 MG tablet One after supper   fluticasone (FLONASE) 50 MCG/ACT nasal spray Place 2 sprays into both nostrils daily.   glucose blood test strip 1 each by Other route as needed for other. Use as instructed   Insulin Pen Needle 32G X 6 MM MISC Use with ozempic   isosorbide mononitrate (IMDUR) 60 MG 24 hr tablet Take 1.5 tablets (90 mg total) by mouth daily.   linaclotide (LINZESS) 72 MCG capsule Take 72 mcg by mouth every other day.    melatonin 3 MG TABS tablet Take 1 tablet (3 mg total) by mouth at bedtime.   metFORMIN (GLUCOPHAGE) 500 MG tablet Take 1 tablet (500 mg total) by mouth 2 (two) times daily with a meal.   metoprolol succinate (TOPROL-XL) 50 MG 24 hr tablet Take 1 tablet (50 mg total) by mouth daily.   nitroGLYCERIN (NITROSTAT) 0.4 MG SL tablet DISSOLVE ONE TABLET UNDER THE TONGUE EVERY 5 MINUTES AS NEEDED FOR CHEST PAIN.&nbsp;&nbsp;DO NOT EXCEED A TOTAL OF 3 DOSES IN 15 MINUTES   oxymetazoline (AFRIN) 0.05 % nasal spray Place 1 spray into both nostrils 2 (two) times daily as needed for congestion.   pantoprazole (PROTONIX) 40 MG tablet Take 1 tablet (40 mg total) by mouth daily. Take 30-60 min before first meal of the day   rosuvastatin (CRESTOR) 10 MG tablet Take 1 tablet (10 mg total) by mouth every Monday, Wednesday, and Friday.   Semaglutide, 1 MG/DOSE, (OZEMPIC, 1 MG/DOSE,) 4 MG/3ML SOPN Inject 1 mg into the skin once a week.   valsartan (DIOVAN) 160 MG tablet Take 1 tablet (160 mg total) by mouth daily.                    Past Medical History:  Diagnosis Date   Arthritis    CAD (coronary artery disease)    a. 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 Tarri Reyes on 05/08/2021 -Pigmented   Unstable angina ([place]) 07/2017        Objective:    Wts   02/28/2022        159   01/17/22 157 lb 6.4 oz (71.4 kg)  01/01/22 159 lb (72.1 kg)  11/07/21 162 lb 9.6 oz (73.8 kg)      Vital signs reviewed  02/28/2022  - Note at rest 02 sats  96% on RA   General appearance:    pleasant amb bm nad   HEENT :  Oropharynx  clear/ no longer pseudowheeze  / nl voice texture        NECK :  without  apparent JVD/ palpable Nodes/TM    LUNGS: no acc muscle use,  Nl contour chest which is clear to A  and P bilaterally without cough on insp or exp maneuvers   CV:  RRR  no s3 or murmur or increase in P2, and no edema   ABD:  soft and nontender with nl inspiratory excursion in the supine position. No bruits or organomegaly appreciated   MS:  Nl gait/ ext warm without deformities Or obvious joint restrictions  calf tenderness, cyanosis or clubbing    SKIN: warm and dry without lesions    NEURO:  alert, approp, nl sensorium with  no motor or cerebellar deficits apparent.          Assessment

## 2022-02-28 NOTE — Assessment & Plan Note (Signed)
Onset 2022 with raspy pseudowheeze on exam 01/17/2022  - Echo 10/04/63  Grad 1 diastolic dysfunction with trivial MR/ nl LA - PFT's  11/14/21   FEV1 2.47 (97 % ) ratio 0.82  p 8 % improvement from saba p ? prior to study with DLCO  13.62  (58%) and FV curve nl   -  01/17/2022   Walked on RA  x  3  lap(s) =  approx 450  ft  @ very fast pace, stopped due to end of study s sob  with lowest 02 sats 96%  - 02/28/2022  Improved, no longer pseudowheezing on gerd rx and off losartan  Resolved to his satisfaction though still relatively sedentary so rec reg submax ex   and regroup in 6 m, call sooner for CPST if not making progress

## 2022-02-28 NOTE — Chronic Care Management (AMB) (Signed)
02-28-2022: LVM to reschedule 03-04-2022 appointment with [name]. Appointment canceled.  03-05-2022: Patient returned called and rescheduled to November.  [place] Pharmacist Assistant [phone]

## 2022-03-04 ENCOUNTER — Telehealth: Payer: Medicare HMO

## 2022-03-13 ENCOUNTER — Telehealth: Payer: Self-pay

## 2022-03-13 NOTE — Chronic Care Management (AMB) (Signed)
Chronic Care Management Pharmacy Assistant   Name: Ronald Faulkner  MRN: [id number] DOB: 09-May-1951  Reason for Encounter: Disease State/ Diabetes  Recent office visits:  01-01-2022 [name], FNP. RDW= 15.5. A1C= 6.7. Microalb/Creat Ratio= 108. Glucose= 103, CO2= 19.   Recent consult visits:  02-28-2022 Tanda Rockers, MD (Pulmonary). Flu vaccine given.  01-17-2022 Tanda Rockers, MD (Pulmonary). DG Chest 2 View completed. START famotidine 20 mg nightly, Protonix 40 mg daily before breakfast, Valsartan 160 mg daily. [facility] visits:  None in previous 6 months  Medications: Outpatient Encounter Medications as of 03/13/2022  Medication Sig   allopurinol (ZYLOPRIM) 300 MG tablet Take 1 tablet by mouth once daily   amLODipine (NORVASC) 10 MG tablet Take 1 tablet (10 mg total) by mouth daily.   aspirin EC 81 MG tablet Take 81 mg by mouth daily.   ezetimibe (ZETIA) 10 MG tablet Take 1 tablet (10 mg total) by mouth daily.   famotidine (PEPCID) 20 MG tablet One after supper   fluticasone (FLONASE) 50 MCG/ACT nasal spray Place 2 sprays into both nostrils daily.   glucose blood test strip 1 each by Other route as needed for other. Use as instructed   Insulin Pen Needle 32G X 6 MM MISC Use with ozempic   isosorbide mononitrate (IMDUR) 60 MG 24 hr tablet Take 1.5 tablets (90 mg total) by mouth daily.   linaclotide (LINZESS) 72 MCG capsule Take 72 mcg by mouth every other day.   melatonin 3 MG TABS tablet Take 1 tablet (3 mg total) by mouth at bedtime.   metFORMIN (GLUCOPHAGE) 500 MG tablet Take 1 tablet (500 mg total) by mouth 2 (two) times daily with a meal.   metoprolol succinate (TOPROL-XL) 50 MG 24 hr tablet Take 1 tablet (50 mg total) by mouth daily.   nitroGLYCERIN (NITROSTAT) 0.4 MG SL tablet DISSOLVE ONE TABLET UNDER THE TONGUE EVERY 5 MINUTES AS NEEDED FOR CHEST PAIN.&nbsp;&nbsp;DO NOT EXCEED A TOTAL OF 3 DOSES IN 15 MINUTES   oxymetazoline (AFRIN) 0.05 %  nasal spray Place 1 spray into both nostrils 2 (two) times daily as needed for congestion.   pantoprazole (PROTONIX) 40 MG tablet Take 1 tablet (40 mg total) by mouth daily. Take 30-60 min before first meal of the day   rosuvastatin (CRESTOR) 10 MG tablet Take 1 tablet (10 mg total) by mouth every Monday, Wednesday, and Friday.   Semaglutide, 1 MG/DOSE, (OZEMPIC, 1 MG/DOSE,) 4 MG/3ML SOPN Inject 1 mg into the skin once a week.   valsartan (DIOVAN) 160 MG tablet Take 1 tablet (160 mg total) by mouth daily.   No facility-administered encounter medications on file as of 03/13/2022.  Recent Relevant Labs: Lab Results  Component Value Date/Time   HGBA1C 6.7 (H) 01/01/2022 11:33 AM   HGBA1C 7.0 (H) 09/12/2021 03:46 PM   MICROALBUR 150 09/05/2020 03:51 PM    Kidney Function Lab Results  Component Value Date/Time   CREATININE 0.91 01/01/2022 11:33 AM   CREATININE 0.97 09/12/2021 03:46 PM   GFRNONAA 81 06/05/2020 03:35 PM   GFRAA 93 06/05/2020 03:35 PM    Current antihyperglycemic regimen:  Ozempic 0.5 mg weekly  Metformin 500 mg twice daily  What recent interventions/DTPs have been made to improve glycemic control:  Educated on A1c and blood sugar goals; Complications of diabetes including kidney damage, retinal damage, and cardiovascular disease; -Counseled to check feet daily and get yearly eye exams -Recommended to continue current medication  Have there  been any recent hospitalizations or ED visits since last visit with CPP? No  Patient denies hypoglycemic symptoms  Patient denies hyperglycemic symptoms  How often are you checking your blood sugar? Patient stated 4 times weekly.  What are your blood sugars ranging?  Fasting: 138, 127, 99 Before meals: None After meals: None Bedtime: None  During the week, how often does your blood glucose drop below 70? Never  Are you checking your feet daily/regularly? Daily  Adherence Review: Is the patient currently on a STATIN  medication? Yes Is the patient currently on ACE/ARB medication? Yes Does the patient have >5 day gap between last estimated fill dates? No  Care Gaps: Covid booster overdue Yearly ophthalmology overdue  Star Rating Drugs: Valsartan 160 mg- Last filled 03-10-2022 30 DS [facility] Ozempic 0.5 mg- Patient assistance Rosuvastatin 5 mg- Last filled 02-21-2022 84 DS [facility] Metformin 500 mg- Last filled 02-27-2022 90 DS [place] Clinical Pharmacist Assistant [phone]

## 2022-03-19 ENCOUNTER — Telehealth: Payer: Self-pay

## 2022-03-19 NOTE — Chronic Care Management (AMB) (Signed)
Patient picked up 4 boxes of Ozempic 1 mg from office on 03/18/2022, no further actions required, patient due for 2024 re-enrollment.   Pattricia Boss, [place] Pharmacist Assistant [phone]

## 2022-04-13 ENCOUNTER — Encounter (INDEPENDENT_AMBULATORY_CARE_PROVIDER_SITE_OTHER): Payer: Self-pay | Admitting: Gastroenterology

## 2022-04-14 ENCOUNTER — Other Ambulatory Visit: Payer: Self-pay | Admitting: Nurse Practitioner

## 2022-04-16 ENCOUNTER — Telehealth: Payer: Medicare HMO

## 2022-04-27 ENCOUNTER — Other Ambulatory Visit: Payer: Self-pay | Admitting: Cardiology

## 2022-04-29 ENCOUNTER — Telehealth: Payer: Self-pay

## 2022-04-29 NOTE — Chronic Care Management (AMB) (Signed)
[employer] patient assistance program notification:  120- day supply of Ozempic 1 mg was filled on 04/04/2022 and should arrive to the office in 10-14 business days. Patient has 0  refill remaining and enrollment will expire on 06/01/2022.

## 2022-05-05 ENCOUNTER — Other Ambulatory Visit: Payer: Self-pay | Admitting: Cardiology

## 2022-05-07 ENCOUNTER — Ambulatory Visit (INDEPENDENT_AMBULATORY_CARE_PROVIDER_SITE_OTHER): Payer: Medicare HMO | Admitting: Nurse Practitioner

## 2022-05-07 ENCOUNTER — Encounter: Payer: Self-pay | Admitting: Nurse Practitioner

## 2022-05-07 VITALS — BP 128/76 | HR 75 | Temp 97.9°F | Ht 67.0 in | Wt 156.0 lb

## 2022-05-07 DIAGNOSIS — R21 Rash and other nonspecific skin eruption: Secondary | ICD-10-CM

## 2022-05-07 DIAGNOSIS — I119 Hypertensive heart disease without heart failure: Secondary | ICD-10-CM | POA: Diagnosis not present

## 2022-05-07 DIAGNOSIS — E1159 Type 2 diabetes mellitus with other circulatory complications: Secondary | ICD-10-CM

## 2022-05-07 LAB — CMP14+EGFR
ALT: 24 IU/L (ref 0–44)
AST: 23 IU/L (ref 0–40)
Albumin/Globulin Ratio: 1.4 (ref 1.2–2.2)
Albumin: 4.3 g/dL (ref 3.8–4.8)
Alkaline Phosphatase: 159 IU/L — ABNORMAL HIGH (ref 44–121)
BUN/Creatinine Ratio: 9 — ABNORMAL LOW (ref 10–24)
BUN: 8 mg/dL (ref 8–27)
Bilirubin Total: 0.3 mg/dL (ref 0.0–1.2)
CO2: 21 mmol/L (ref 20–29)
Calcium: 9.7 mg/dL (ref 8.6–10.2)
Chloride: 105 mmol/L (ref 96–106)
Creatinine, Ser: 0.91 mg/dL (ref 0.76–1.27)
Globulin, Total: 3.1 g/dL (ref 1.5–4.5)
Glucose: 112 mg/dL — ABNORMAL HIGH (ref 70–99)
Potassium: 3.9 mmol/L (ref 3.5–5.2)
Sodium: 144 mmol/L (ref 134–144)
Total Protein: 7.4 g/dL (ref 6.0–8.5)
eGFR: 90 mL/min/{1.73_m2} (ref >59)

## 2022-05-07 LAB — HEMOGLOBIN A1C
Est. average glucose Bld gHb Est-mCnc: 140 mg/dL
Hgb A1c MFr Bld: 6.5 % — ABNORMAL HIGH (ref 4.8–5.6)

## 2022-05-07 MED ORDER — TRIAMCINOLONE ACETONIDE 0.5 % EX OINT: 1.0000 | TOPICAL_OINTMENT | Freq: Two times a day (BID) | CUTANEOUS | 0 refills | Status: AC

## 2022-05-07 NOTE — Progress Notes (Signed)
I,Tianna Badgett,acting as a Education administrator for [employer], FNP.,have documented all relevant documentation on the behalf of [name], FNP,as directed by  [name], FNP while in the presence of [name], [place].  Subjective:     Patient ID: Ronald Faulkner , male    DOB: [birth year] , 71 y.o.   MRN: [id number]   Chief Complaint  Patient presents with   Diabetes    HPI  Pt presents today for DM & HTN f/u.   Diabetes He presents for his follow-up diabetic visit. He has type 2 diabetes mellitus. No MedicAlert identification noted. His disease course has been improving. There are no hypoglycemic associated symptoms. Pertinent negatives for hypoglycemia include no dizziness or headaches. There are no diabetic associated symptoms. There are no hypoglycemic complications. Symptoms are stable. There are no diabetic complications. Risk factors for coronary artery disease include diabetes mellitus, male sex, sedentary lifestyle and hypertension. Current diabetic treatment includes oral agent (dual therapy) (Ozempic and metformin). He is following a diabetic diet. When asked about meal planning, he reported none. He has not had a previous visit with a dietitian. He rarely participates in exercise. (Blood sugar ranging 98 - 130) An ACE inhibitor/angiotensin II receptor blocker is being taken.  Hypertension This is a chronic problem. The current episode started 1 to 4 weeks ago. The problem has been gradually worsening since onset. The problem is uncontrolled. Pertinent negatives include no headaches, peripheral edema or shortness of breath (when lifting a heavy object and walking). There are no associated agents to hypertension. Risk factors for coronary artery disease include sedentary lifestyle. Past treatments include angiotensin blockers, calcium channel blockers and direct vasodilators. The current treatment provides moderate improvement. There are no compliance problems.  There is no history of angina.  There is no history of chronic renal disease.     Past Medical History:  Diagnosis Date   Arthritis    CAD (coronary artery disease)    a. 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 Tarri Antion on 05/08/2021 -Pigmented   Unstable angina ([place]) 07/2017     Family History  Problem Relation Age of Onset   Colon cancer Mother    Coronary artery disease Maternal Grandmother    Anesthesia problems Neg Hx    Hypotension Neg Hx    Malignant hyperthermia Neg Hx    Pseudochol deficiency Neg Hx      Current Outpatient Medications:    allopurinol (ZYLOPRIM) 300 MG tablet, Take 1 tablet by mouth once daily, Disp: 90 tablet, Rfl: 0   amLODipine (NORVASC) 10 MG tablet, Take 1 tablet by mouth once daily, Disp: 90 tablet, Rfl: 3   aspirin EC 81 MG tablet, Take 81 mg by mouth daily., Disp: , Rfl:    ezetimibe (ZETIA) 10 MG tablet, Take 1 tablet (10 mg total) by mouth daily., Disp: 90 tablet, Rfl: 1   fluticasone (FLONASE) 50 MCG/ACT nasal spray, Place 2 sprays into both nostrils daily., Disp: , Rfl:    glucose blood test strip, 1  each by Other route as needed for other. Use as instructed, Disp: 100 each, Rfl: 6   Insulin Pen Needle 32G X 6 MM MISC, Use with ozempic, Disp: 50 each, Rfl: 3   isosorbide mononitrate (IMDUR) 60 MG 24 hr tablet, Take 1.5 tablets (90 mg total) by mouth daily., Disp: 135 tablet, Rfl: 1   linaclotide (LINZESS) 72 MCG capsule, Take 72 mcg by mouth every other day., Disp: , Rfl:    metFORMIN (GLUCOPHAGE) 500 MG tablet, Take 1 tablet (500 mg total) by mouth 2 (two) times daily  with a meal., Disp: 180 tablet, Rfl: 3   metoprolol succinate (TOPROL-XL) 50 MG 24 hr tablet, Take 1 tablet by mouth once daily, Disp: 90 tablet, Rfl: 1   nitroGLYCERIN (NITROSTAT) 0.4 MG SL tablet, DISSOLVE ONE TABLET UNDER THE TONGUE EVERY 5 MINUTES AS NEEDED FOR CHEST PAIN.&nbsp;&nbsp;DO NOT EXCEED A TOTAL OF 3 DOSES IN 15 MINUTES, Disp: 25 tablet, Rfl: 0   oxymetazoline (AFRIN) 0.05 % nasal spray, Place 1 spray into both nostrils 2 (two) times daily as needed for congestion., Disp: , Rfl:    pantoprazole (PROTONIX) 40 MG tablet, Take 1 tablet (40 mg total) by mouth daily. Take 30-60 min before first meal of the day, Disp: 30 tablet, Rfl: 2   rosuvastatin (CRESTOR) 10 MG tablet, Take 1 tablet (10 mg total) by mouth every Monday, Wednesday, and Friday., Disp: 36 tablet, Rfl: 1   Semaglutide, 1 MG/DOSE, (OZEMPIC, 1 MG/DOSE,) 4 MG/3ML SOPN, Inject 1 mg into the skin once a week., Disp: 9 mL, Rfl: 1   triamcinolone ointment (KENALOG) 0.5 %, Apply 1 Application topically 2 (two) times daily., Disp: 30 g, Rfl: 0   valsartan (DIOVAN) 160 MG tablet, Take 1 tablet (160 mg total) by mouth daily., Disp: 30 tablet, Rfl: 11   Allergies  Allergen Reactions   Bee Venom    Fish Allergy Hives and Swelling   Ivp Dye [Iodinated Contrast Media] Swelling     Review of Systems  Constitutional: Negative.   Respiratory: Negative.  Negative for shortness of breath (when lifting a heavy object and walking).   Cardiovascular: Negative.   Gastrointestinal: Negative.   Skin:        Left leg itching  Neurological: Negative.  Negative for dizziness and headaches.  Psychiatric/Behavioral: Negative.       Today's Vitals   05/07/22 1028  BP: 128/76  Pulse: 75  Temp: 97.9 F (36.6 C)  TempSrc: Oral  Weight: 156 lb (70.8 kg)  Height: _0  (1.702 m)  PainSc: 0-No pain   Body mass index is 24.43 kg/m.  Wt Readings from Last 3 Encounters:  05/21/22 154 lb 12.8 oz (70.2 kg)  05/07/22 156 lb (70.8 kg)   02/28/22 159 lb (72.1 kg)    Objective:  Physical Exam Vitals reviewed.  Constitutional:      General: He is not in acute distress.    Appearance: Normal appearance.  Cardiovascular:     Rate and Rhythm: Normal rate and regular rhythm.     Pulses: Normal pulses.     Heart sounds: Normal heart sounds. No murmur heard. Pulmonary:     Effort: Pulmonary effort is normal. No respiratory distress.     Breath sounds: Normal breath sounds. No wheezing.  Skin:    General: Skin is warm and dry.     Findings: Rash (right leg with flat scaly rash, erythematous) present.  Neurological:     General: No focal deficit present.  Mental Status: He is alert and oriented to person, place, and time.     Cranial Nerves: No cranial nerve deficit.     Motor: No weakness.  Psychiatric:        Mood and Affect: Mood normal.        Behavior: Behavior normal.        Thought Content: Thought content normal.        Judgment: Judgment normal.         Assessment And Plan:     1. Type 2 diabetes mellitus with other circulatory complication, without long-term current use of insulin (HCC) Comments: HgbA1c is stable continue current medications - Hemoglobin A1c - CMP14+EGFR  2. Hypertensive heart disease without heart failure Comments: Blood pressure is well controlled, continue current medications - CMP14+EGFR  3. Rash and nonspecific skin eruption Comments: right lower leg with dry scaly rash, will treat with triamncinolone - triamcinolone ointment (KENALOG) 0.5 %; Apply 1 Application topically 2 (two) times daily.  Dispense: 30 g; Refill: 0     Patient was given opportunity to ask questions. Patient verbalized understanding of the plan and was able to repeat key elements of the plan. All questions were answered to their satisfaction.  [name], FNP   I, [name], FNP, have reviewed all documentation for this visit. The documentation on 05/07/22 for the exam, diagnosis, procedures, and  orders are all accurate and complete.   IF YOU HAVE BEEN REFERRED TO A SPECIALIST, IT MAY TAKE 1-2 WEEKS TO SCHEDULE/PROCESS THE REFERRAL. IF YOU HAVE NOT HEARD FROM US/SPECIALIST IN TWO WEEKS, PLEASE GIVE [place] A CALL AT [phone].   THE PATIENT IS ENCOURAGED TO PRACTICE SOCIAL DISTANCING DUE TO THE COVID-19 PANDEMIC.

## 2022-05-07 NOTE — Patient Instructions (Addendum)

## 2022-05-21 ENCOUNTER — Encounter: Payer: Self-pay | Admitting: Cardiology

## 2022-05-21 ENCOUNTER — Ambulatory Visit: Payer: Medicare HMO | Attending: Cardiology | Admitting: Cardiology

## 2022-05-21 VITALS — BP 116/80 | HR 98 | Ht 67.0 in | Wt 154.8 lb

## 2022-05-21 DIAGNOSIS — I10 Essential (primary) hypertension: Secondary | ICD-10-CM

## 2022-05-21 DIAGNOSIS — E782 Mixed hyperlipidemia: Secondary | ICD-10-CM | POA: Diagnosis not present

## 2022-05-21 DIAGNOSIS — I25119 Atherosclerotic heart disease of native coronary artery with unspecified angina pectoris: Secondary | ICD-10-CM

## 2022-05-21 NOTE — Progress Notes (Signed)
Clinical Summary Ronald Faulkner is a 70 y.o.male seen today for follow up of the following medical problems.   1.CAD -history of DES x 2  to LAD in 07/2017 - 10/2019 cath: patent stents LAD, high grade diffuse disease small ramus, normal LCX, OM 50%, RCA 30% distal. LVEDP 20 - 07/2020 echo: LVEF 60-65%, no WMAs, grade I dd, normal RV function.  - 03/2021 nuclear stress: no ischemia - headaches on imdur '90mg'$  though would be willing to retry.      - infrequent chest pains, about once a month. - compliant with meds    2.Hyperlipidemia - difficultly tolerating statin, no crestor 3 days a week and zetia  - he is on zetia - Jan 2023 TC 167 TG 102 HDL 58 LDL 91. Appears was not taking zetia at that time.   12/2021 TC 119 TG 103 HDL 49 LDL 51   3. HTN - compliant with meds   4. SOB - ongoing 3-4 months - no coughing, no wheezing - DOE walking 30 to 40 feet. Some associated chest pain. Squeezing like feeling left sided, can be mild to moderate. Pain lasts a few minutes. Symptoms improve with rest. - increasing in frequency.  - occasional LE edema   -ongoing SOB unchanged  12/2021 abnormal PFTs, referred to pulmonary -breathing has improved.     2020 CT no aneurysm    SH: big [employer] fan  Past Medical History:  Diagnosis Date   Arthritis    CAD (coronary artery disease)    a. s/p DES to mid and distal LAD in 07/2017 with medical management recommended of intermediate Haevyn Ury   Cancer [facility])    prostate   CHF (congestive heart failure) ([place])    Colon polyps    Diabetes mellitus without complication ([place])    Diverticulitis    History of kidney stones    Hypertension    IBS (irritable bowel syndrome)    RBBB (right bundle Bertel Venard block) with left posterior fascicular block    Seborrheic keratosis 05/20/2021   Dx by Dr. Tarri Arth on 05/08/2021 -Pigmented   Unstable angina ([place]) 07/2017     Allergies  Allergen Reactions   Bee Venom    Fish Allergy Hives and  Swelling   Ivp Dye [Iodinated Contrast Media] Swelling     Current Outpatient Medications  Medication Sig Dispense Refill   allopurinol (ZYLOPRIM) 300 MG tablet Take 1 tablet by mouth once daily 90 tablet 0   amLODipine (NORVASC) 10 MG tablet Take 1 tablet by mouth once daily 90 tablet 3   aspirin EC 81 MG tablet Take 81 mg by mouth daily.     ezetimibe (ZETIA) 10 MG tablet Take 1 tablet (10 mg total) by mouth daily. 90 tablet 1   famotidine (PEPCID) 20 MG tablet One after supper 30 tablet 11   fluticasone (FLONASE) 50 MCG/ACT nasal spray Place 2 sprays into both nostrils daily.     glucose blood test strip 1 each by Other route as needed for other. Use as instructed 100 each 6   Insulin Pen Needle 32G X 6 MM MISC Use with ozempic 50 each 3   isosorbide mononitrate (IMDUR) 60 MG 24 hr tablet Take 1.5 tablets (90 mg total) by mouth daily. 135 tablet 1   linaclotide (LINZESS) 72 MCG capsule Take 72 mcg by mouth every other day.     melatonin 3 MG TABS tablet Take 1 tablet (3 mg total) by mouth at bedtime.  14 tablet 0   metFORMIN (GLUCOPHAGE) 500 MG tablet Take 1 tablet (500 mg total) by mouth 2 (two) times daily with a meal. 180 tablet 3   metoprolol succinate (TOPROL-XL) 50 MG 24 hr tablet Take 1 tablet by mouth once daily 90 tablet 1   nitroGLYCERIN (NITROSTAT) 0.4 MG SL tablet DISSOLVE ONE TABLET UNDER THE TONGUE EVERY 5 MINUTES AS NEEDED FOR CHEST PAIN.&nbsp;&nbsp;DO NOT EXCEED A TOTAL OF 3 DOSES IN 15 MINUTES 25 tablet 0   oxymetazoline (AFRIN) 0.05 % nasal spray Place 1 spray into both nostrils 2 (two) times daily as needed for congestion.     pantoprazole (PROTONIX) 40 MG tablet Take 1 tablet (40 mg total) by mouth daily. Take 30-60 min before first meal of the day 30 tablet 2   rosuvastatin (CRESTOR) 10 MG tablet Take 1 tablet (10 mg total) by mouth every Monday, Wednesday, and Friday. 36 tablet 1   Semaglutide, 1 MG/DOSE, (OZEMPIC, 1 MG/DOSE,) 4 MG/3ML SOPN Inject 1 mg into the skin  once a week. 9 mL 1   triamcinolone ointment (KENALOG) 0.5 % Apply 1 Application topically 2 (two) times daily. 30 g 0   valsartan (DIOVAN) 160 MG tablet Take 1 tablet (160 mg total) by mouth daily. 30 tablet 11   No current facility-administered medications for this visit.     Past Surgical History:  Procedure Laterality Date   ABDOMINAL SURGERY     APPENDECTOMY     BREAST SURGERY     benign lump at lt side   COLON SURGERY     COLONOSCOPY  03/14/2011   Procedure: COLONOSCOPY;  Surgeon: [name], MD;  [facility];  Service: Endoscopy;  Laterality: N/A;  1:00   COLONOSCOPY N/A 11/15/2015   Procedure: COLONOSCOPY;  Surgeon: [name], MD;  [facility];  Service: Endoscopy;  Laterality: N/A;  11:15   COLONOSCOPY WITH PROPOFOL N/A 07/15/2019   Procedure: COLONOSCOPY WITH PROPOFOL;  Surgeon: [name], MD;  [facility]: [facility];  Service: Endoscopy;  Laterality: N/A;  1235   CORONARY STENT INTERVENTION N/A 07/31/2017   Procedure: CORONARY STENT INTERVENTION;  Surgeon: [name], MD;  Location: [place] [facility];  Service: Cardiovascular;  Laterality: N/A;   LEFT HEART CATH AND CORONARY ANGIOGRAPHY N/A 07/31/2017   Procedure: LEFT HEART CATH AND CORONARY ANGIOGRAPHY;  Surgeon: [name], MD;  Location: [place] [facility];  Service: Cardiovascular;  Laterality: N/A;   LEFT HEART CATH AND CORONARY ANGIOGRAPHY N/A 10/28/2019   Procedure: LEFT HEART CATH AND CORONARY ANGIOGRAPHY;  Surgeon: Belva Crome, MD;  Location: [place] [facility];  Service: Cardiovascular;  Laterality: N/A;   PENILE PROSTHESIS IMPLANT     2016    prostate cancer     2015. prostectomy     Allergies  Allergen Reactions   Bee Venom    Fish Allergy Hives and Swelling   Ivp Dye [Iodinated Contrast Media] Swelling      Family History  Problem Relation Age of Onset   Colon cancer Mother    Coronary artery disease Maternal Grandmother     Anesthesia problems Neg Hx    Hypotension Neg Hx    Malignant hyperthermia Neg Hx    Pseudochol deficiency Neg Hx      Social History Ronald Faulkner reports that he quit smoking about 22 years ago. His smoking use included cigarettes. He has a 35.00 pack-year smoking history. He has never used smokeless  tobacco. Ronald Faulkner reports current alcohol use.   Review of Systems CONSTITUTIONAL: No weight loss, fever, chills, weakness or fatigue.  HEENT: Eyes: No visual loss, blurred vision, double vision or yellow sclerae.No hearing loss, sneezing, congestion, runny nose or sore throat.  SKIN: No rash or itching.  CARDIOVASCULAR: per hpi RESPIRATORY: No shortness of breath, cough or sputum.  GASTROINTESTINAL: No anorexia, nausea, vomiting or diarrhea. No abdominal pain or blood.  GENITOURINARY: No burning on urination, no polyuria NEUROLOGICAL: No headache, dizziness, syncope, paralysis, ataxia, numbness or tingling in the extremities. No change in bowel or bladder control.  MUSCULOSKELETAL: No muscle, back pain, joint pain or stiffness.  LYMPHATICS: No enlarged nodes. No history of splenectomy.  PSYCHIATRIC: No history of depression or anxiety.  ENDOCRINOLOGIC: No reports of sweating, cold or heat intolerance. No polyuria or polydipsia.  Marland Kitchen   Physical Examination Today's Vitals   05/21/22 1141  BP: 116/80  Pulse: 98  SpO2: 100%  Weight: 154 lb 12.8 oz (70.2 kg)  Height: '5\' 7"'$  (1.702 m)   Body mass index is 24.25 kg/m.  Gen: resting comfortably, no acute distress HEENT: no scleral icterus, pupils equal round and reactive, no palptable cervical adenopathy,  CV: RRR, no m/r/g no jvd Resp: Clear to auscultation bilaterally GI: abdomen is soft, non-tender, non-distended, normal bowel sounds, no hepatosplenomegaly MSK: extremities are warm, no edema.  Skin: warm, no rash Neuro:  no focal deficits Psych: appropriate affect   Diagnostic Studies 07/2017 cath Prox RCA to Mid RCA lesion is  20% stenosed. Dist RCA lesion is 50% stenosed. Dist Cx lesion is 60% stenosed. Ost Cx to Prox Cx lesion is 20% stenosed. Ost 1st Mrg to 1st Mrg lesion is 70% stenosed. Ost LAD to Prox LAD lesion is 20% stenosed. Mid LAD-1 lesion is 80% stenosed. Mid LAD-2 lesion is 95% stenosed. Dist LAD lesion is 70% stenosed. A drug-eluting stent was successfully placed using a STENT SIERRA 2.25 X 12 MM. Post intervention, there is a 0% residual stenosis. A drug-eluting stent was successfully placed using a STENT SIERRA 2.50 X 18 MM. Post intervention, there is a 0% residual stenosis.   1. Severe stenosis in the mid and distal LAD. Successful PTCA/DES x 1 distal LAD and PTCA/DES x 1 mid LAD.  2. Modeately severe stenosis in small caliber intermediate Harriet Bollen.  3. Moderate stenosis in the distal RCA   Post cath Recommendations: Will continue DAPT with ASA and Plavix for at least one year. Will continue beta blocker and statin. I would recommend medical management of the disease in the small caliber intermediate Haydin Calandra.    10/2019 cath Patent left main Patent LAD mid and distal stent. High-grade diffuse obstructive disease in a branching relatively small ramus intermedius.  Slight progression compared to 2019. Widely patent circumflex with first obtuse marginal containing a 50% proximal narrowing Widely patent dominant right coronary with 30 to 40% narrowing distally. Normal LV function with EF 55%.  LVEDP 20 mmHg.   RECOMMENDATIONS:   Continue current therapy Further medication adjustments and management per Dr.,[place]weran     07/2020 echo 1. Left ventricular ejection fraction, by estimation, is 60 to 65%. The  left ventricle has normal function. The left ventricle has no regional  wall motion abnormalities. Left ventricular diastolic parameters are  consistent with Grade I diastolic  dysfunction (impaired relaxation).   2. Right ventricular systolic function is normal. The right ventricular   size is normal. Tricuspid regurgitation signal is inadequate for assessing  PA pressure.  3. The mitral valve is grossly normal. Trivial mitral valve  regurgitation.   4. The aortic valve is tricuspid. There is mild calcification of the  aortic valve. Aortic valve regurgitation is not visualized.   5. The inferior vena cava is normal in size with greater than 50%  respiratory variability, suggesting right atrial pressure of 3 mmHg.   Comparison(s): Echocardiogram done 10/13/18 showed an EF of 60-65%     03/2021 nuclear stress:  The study is normal. There are no perfusion defects consistent with prior infarct or current ischemia. The study is low risk.   No ST deviation was noted.   There is a moderate size moderate intensity inferior defect that is most intense in the resting images with normal wall motion. Findings consistent with diaphragmatic attenuation and artifact due to adjacet gut tracer uptake.   Left ventricular function is normal. Nuclear stress EF: 67 %. The left ventricular ejection fraction is hyperdynamic (>65%). End diastolic cavity size is normal.  10/2021 PFTs: minimal obstruction, moderate diffusion defect, +airtrapping    Assessment and Plan  1.CAD with unspecified angina - 2021 cath with stable disease including residual ramus disease, 03/2021 nuclear stress no ischemia - infrequent chest pains, continue current meds   2. Hyperlipidemia - at goal,continue current meds  3. HTN - at goal, conitnue current meds      [name], M.D.

## 2022-05-21 NOTE — Patient Instructions (Addendum)

## 2022-05-22 ENCOUNTER — Telehealth: Payer: Self-pay

## 2022-05-22 NOTE — Chronic Care Management (AMB) (Signed)
Patient aware Ozempic patient assistance medication has arrived to the office and ready for pick up. Patient also had questions about patient assistance application, patient will fill out [employer] re- enrollment application and return to the office with SS# documentation for him and his wife and 2 pay stubs.    Pattricia Boss, [place] Pharmacist Assistant [phone]

## 2022-05-28 ENCOUNTER — Other Ambulatory Visit: Payer: Self-pay | Admitting: Nurse Practitioner

## 2022-06-04 ENCOUNTER — Emergency Department (HOSPITAL_COMMUNITY)
Admission: EM | Admit: 2022-06-04 | Discharge: 2022-06-04 | Disposition: A | Discharge status: Home / Self Care | Payer: Medicare HMO | Attending: Emergency Medicine | Admitting: Emergency Medicine

## 2022-06-04 ENCOUNTER — Emergency Department (HOSPITAL_COMMUNITY): Payer: Medicare HMO

## 2022-06-04 DIAGNOSIS — I10 Essential (primary) hypertension: Secondary | ICD-10-CM | POA: Insufficient documentation

## 2022-06-04 DIAGNOSIS — U071 COVID-19: Secondary | ICD-10-CM | POA: Insufficient documentation

## 2022-06-04 DIAGNOSIS — E119 Type 2 diabetes mellitus without complications: Secondary | ICD-10-CM | POA: Insufficient documentation

## 2022-06-04 DIAGNOSIS — Z7982 Long term (current) use of aspirin: Secondary | ICD-10-CM | POA: Diagnosis not present

## 2022-06-04 DIAGNOSIS — R079 Chest pain, unspecified: Secondary | ICD-10-CM

## 2022-06-04 DIAGNOSIS — Z794 Long term (current) use of insulin: Secondary | ICD-10-CM | POA: Insufficient documentation

## 2022-06-04 DIAGNOSIS — Z79899 Other long term (current) drug therapy: Secondary | ICD-10-CM | POA: Diagnosis not present

## 2022-06-04 DIAGNOSIS — Z7984 Long term (current) use of oral hypoglycemic drugs: Secondary | ICD-10-CM | POA: Insufficient documentation

## 2022-06-04 DIAGNOSIS — I2510 Atherosclerotic heart disease of native coronary artery without angina pectoris: Secondary | ICD-10-CM | POA: Diagnosis not present

## 2022-06-04 DIAGNOSIS — R0602 Shortness of breath: Secondary | ICD-10-CM | POA: Diagnosis present

## 2022-06-04 LAB — TROPONIN I (HIGH SENSITIVITY)
Troponin I (High Sensitivity): 7 ng/L (ref <18)
Troponin I (High Sensitivity): 6 ng/L (ref <18)

## 2022-06-04 LAB — CBC
HCT: 46.3 % (ref 39.0–52.0)
Hemoglobin: 14.7 g/dL (ref 13.0–17.0)
MCH: 27.3 pg (ref 26.0–34.0)
MCHC: 31.7 g/dL (ref 30.0–36.0)
MCV: 86.1 fL (ref 80.0–100.0)
Platelets: 197 10*3/uL (ref 150–400)
RBC: 5.38 MIL/uL (ref 4.22–5.81)
RDW: 14.6 % (ref 11.5–15.5)
WBC: 4.5 10*3/uL (ref 4.0–10.5)
nRBC: 0 % (ref 0.0–0.2)

## 2022-06-04 LAB — BASIC METABOLIC PANEL
Anion gap: 11 (ref 5–15)
BUN: 16 mg/dL (ref 8–23)
CO2: 22 mmol/L (ref 22–32)
Calcium: 8.9 mg/dL (ref 8.9–10.3)
Chloride: 105 mmol/L (ref 98–111)
Creatinine, Ser: 0.94 mg/dL (ref 0.61–1.24)
GFR, Estimated: >60 mL/min (ref >60)
Glucose, Bld: 101 mg/dL — ABNORMAL HIGH (ref 70–99)
Potassium: 3.6 mmol/L (ref 3.5–5.1)
Sodium: 138 mmol/L (ref 135–145)

## 2022-06-04 MED ORDER — ACETAMINOPHEN 325 MG PO TABS
650.0000 mg | ORAL_TABLET | Freq: Once | ORAL | Status: AC
  Administered 2022-06-04: 650 mg via ORAL
  Filled 2022-06-04: qty 2

## 2022-06-04 MED ORDER — ALBUTEROL SULFATE HFA 108 (90 BASE) MCG/ACT IN AERS
2.0000 | INHALATION_SPRAY | Freq: Once | RESPIRATORY_TRACT | Status: AC
  Administered 2022-06-04: 2 via RESPIRATORY_TRACT
  Filled 2022-06-04: qty 6.7

## 2022-06-04 NOTE — ED Notes (Signed)
Pt d/c home per MD order. Discharge summary reviewed, pt verbalizes understanding. Ambulatory off unit. No s/s of acute distress noted at discharge.  °

## 2022-06-04 NOTE — ED Triage Notes (Signed)
Pt to ED c/o chest pain and SHOB x 1 day, Tested positive for COVID 5 days ago.

## 2022-06-04 NOTE — Discharge Instructions (Addendum)
Seen today for body aches and shortness of breath for the past 6 days.  You do have COVID-19 based on your test from home.  Your chest x-ray, blood work including a troponin to rule out heart problem, and EKG were all very reassuring.  Your symptoms are likely due to the COVID-19.  Drink plenty of fluids, rest and follow-up closely with your primary care doctor as well as your heart doctor.  Come back if you have any new or worsening symptoms

## 2022-06-05 NOTE — ED Provider Notes (Signed)
[facility] Provider Note   CSN: [id number] Arrival date & time: 06/04/22  1544     History  Chief Complaint  Patient presents with  . Chest Pain  . Shortness of Breath    Covid positive    LEEVON Faulkner is a 72 y.o. male.  CAD and type 2 diabetes.  He states he was having cough congestion body aches that started 6 days ago.  On 5 days ago he tested positive for COVID at home.  Yesterday he test himself again was positive still.  He still having muscle aches cough some mild dyspnea that is chronic and worse with cough and having intermittent chest pains that he states are brief and not radiating and not associated with exertion   Chest Pain Associated symptoms: shortness of breath   Shortness of Breath Associated symptoms: chest pain        Home Medications Prior to Admission medications   Medication Sig Start Date End Date Taking? Authorizing Provider  allopurinol (ZYLOPRIM) 300 MG tablet Take 1 tablet by mouth once daily 04/15/22   [name], FNP  amLODipine (NORVASC) 10 MG tablet Take 1 tablet by mouth once daily 05/05/22   [name], MD  aspirin EC 81 MG tablet Take 81 mg by mouth daily.    [provider]  ezetimibe (ZETIA) 10 MG tablet Take 1 tablet (10 mg total) by mouth daily. 01/24/22   [name], MD  fluticasone (FLONASE) 50 MCG/ACT nasal spray Place 2 sprays into both nostrils daily.    [provider]  glucose blood test strip 1 each by Other route as needed for other. Use as instructed 10/10/21   [name], FNP  Insulin Pen Needle 32G X 6 MM MISC Use with ozempic 08/10/20   [name], FNP  isosorbide mononitrate (IMDUR) 60 MG 24 hr tablet Take 1.5 tablets (90 mg total) by mouth daily. 09/10/21   [name], MD  linaclotide Rolan Lipa) 72 MCG capsule Take 72 mcg by mouth every other day.    [provider]  metFORMIN (GLUCOPHAGE) 500 MG tablet Take 1 tablet (500 mg total) by mouth 2 (two)  times daily with a meal. 11/27/21   [name], FNP  metoprolol succinate (TOPROL-XL) 50 MG 24 hr tablet Take 1 tablet by mouth once daily 04/28/22   [name], MD  nitroGLYCERIN (NITROSTAT) 0.4 MG SL tablet DISSOLVE ONE TABLET UNDER THE TONGUE EVERY 5 MINUTES AS NEEDED FOR CHEST PAIN.&nbsp;&nbsp;DO NOT EXCEED A TOTAL OF 3 DOSES IN 15 MINUTES 06/05/20   [name], FNP  oxymetazoline (AFRIN) 0.05 % nasal spray Place 1 spray into both nostrils 2 (two) times daily as needed for congestion.    [provider]  pantoprazole (PROTONIX) 40 MG tablet Take 1 tablet (40 mg total) by mouth daily. Take 30-60 min before first meal of the day 01/17/22   Tanda Rockers, MD  rosuvastatin (CRESTOR) 10 MG tablet TAKE 1 TABLET BY MOUTH ON MONDAY, [facility] AND FRIDAY 05/28/22   [name], FNP  Semaglutide, 1 MG/DOSE, (OZEMPIC, 1 MG/DOSE,) 4 MG/3ML SOPN Inject 1 mg into the skin once a week. 09/12/21   [name], FNP  triamcinolone ointment (KENALOG) 0.5 % Apply 1 Application topically 2 (two) times daily. 05/07/22   [name], FNP  valsartan (DIOVAN) 160 MG tablet Take 1 tablet (160 mg total) by mouth daily. 01/17/22   Tanda Rockers, MD      Allergies  Bee venom, Fish allergy, and Ivp dye [iodinated contrast media]    Review of Systems   Review of Systems  Respiratory:  Positive for shortness of breath.   Cardiovascular:  Positive for chest pain.    Physical Exam Updated Vital Signs BP 132/81 (BP Location: Right Arm)   Pulse 91   Temp 97.9 F (36.6 C) (Oral)   Resp 16   Ht '5\' 7"'$  (1.702 m)   Wt 68.9 kg   SpO2 99%   BMI 23.81 kg/m  Physical Exam Vitals and nursing note reviewed.  Constitutional:      General: He is not in acute distress.    Appearance: He is well-developed.  HENT:     Head: Normocephalic and atraumatic.  Eyes:     Conjunctiva/sclera: Conjunctivae normal.  Cardiovascular:     Rate and Rhythm: Normal rate and regular rhythm.     Heart sounds: No  murmur heard. Pulmonary:     Effort: Pulmonary effort is normal. No respiratory distress.     Breath sounds: Normal breath sounds.  Abdominal:     Palpations: Abdomen is soft.     Tenderness: There is no abdominal tenderness.  Musculoskeletal:        General: No swelling.     Cervical back: Neck supple.  Skin:    General: Skin is warm and dry.     Capillary Refill: Capillary refill takes less than 2 seconds.  Neurological:     Mental Status: He is alert.  Psychiatric:        Mood and Affect: Mood normal.     ED Results / Procedures / Treatments   Labs (all labs ordered are listed, but only abnormal results are displayed) Labs Reviewed  BASIC METABOLIC PANEL - Abnormal; Notable for the following components:      Result Value   Glucose, Bld 101 (*)    All other components within normal limits  CBC  TROPONIN I (HIGH SENSITIVITY)  TROPONIN I (HIGH SENSITIVITY)    EKG EKG Interpretation  Date/Time:  Wednesday June 04 2022 15:56:03 EST Ventricular Rate:  77 PR Interval:  156 QRS Duration: 156 QT Interval:  406 QTC Calculation: 459 R Axis:   115 Text Interpretation: Normal sinus rhythm Right bundle branch block Left posterior fascicular block ** Bifascicular block **  no significant change since Feb 2021 Confirmed by Sherwood Gambler [phone]) on 06/04/2022 3:59:03 PM  Radiology DG Chest 2 View  Result Date: 06/04/2022 CLINICAL DATA:  Chest pain, shortness of breath, COVID positive EXAM: CHEST - 2 VIEW COMPARISON:  01/17/2022 FINDINGS: Cardiac size is within normal limits. Low position of diaphragms suggests COPD. There are no signs of pulmonary edema or new focal infiltrates. Small linear densities seen in both lower lung fields with no significant interval change. There is no pleural effusion or pneumothorax. IMPRESSION: There are no signs of pulmonary edema or new focal infiltrates. Small linear densities in the lower lung fields have not changed significantly suggesting  minimal scarring or subsegmental atelectasis. Electronically Signed   By: Elmer Picker M.D.   On: 06/04/2022 16:16    Procedures Procedures    Medications Ordered in ED Medications  acetaminophen (TYLENOL) tablet 650 mg (650 mg Oral Given 06/04/22 2231)  albuterol (VENTOLIN HFA) 108 (90 Base) MCG/ACT inhaler 2 puff (2 puffs Inhalation Given 06/04/22 2236)    ED Course/ Medical Decision Making/ A&P Clinical Course as of 06/05/22 0039  Thu Jun 05, 2022  0039 Note patient was also advised  to follow-up with his cardiologist just because of his history of CAD with the chest pain.  Indication for admission at this time workup [CB]    Clinical Course User Index [CB] Gwenevere Abbot, PA-C                           Medical Decision Making This patient presents to the ED for concern of bodyaches, chest pain and shortness of breath in setting of recent COVID diagnosis, this involves an extensive number of treatment options, and is a complaint that carries with it a high risk of complications and morbidity.  The differential diagnosis includes teen, pneumonia, ACS, other   Co morbidities that complicate the patient evaluation  CAD, hypertension   Additional history obtained:  Additional history obtained from EMR External records from outside source obtained and reviewed including prior cardiology reports   Lab Tests:  I Ordered, and personally interpreted labs.  The pertinent results include: CBC BMP and troponin, all reassuring .  Troponin is only 6 patient has had 6 days of pain, no need for repeat  Imaging Studies ordered:  I ordered imaging studies including chest x-ray I independently visualized and interpreted imaging which showed an area of edema or infiltrate I agree with the radiologist interpretation   Cardiac Monitoring: / EKG:  ECG G shows sinus rhythm no ischemic changes    Problem List / ED Course / Critical interventions / Medication management  Chest  pain, shortness of breath the setting of COVID-19 infection with muscle aches as well.  Patient is well-appearing, reassuring vitals, reassuring labs and ECG including a normal troponin for 6 days of symptoms.  He is feeling slightly better with the shortness of breath with an albuterol inhaler.  Advised to follow-up with this.  Given strict return precautions and PCP follow-up.  Regarding his chest pain, consistent with angina, very reassuring that his troponin is only 6:06 days of symptoms.  He has no pleuritic pain.  He is not hypoxic or tachycardic.  He has no history of VTE, he is not on hormones.  Do not feel he needs PE study or D-dimer I ordered medication including albuterol for shortness  of breath Reevaluation of the patient after these medicines showed that the patient improved I have reviewed the patients home medicines and have made adjustments as needed    Test / Admission - Considered:  Stated PE study, patient has no other risk factors, symptoms have been 6 days and are not worsening he is not tachycardic or hypoxic but tensive, he is not hypoxic.  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  Patient was agreeable with plan of care and discharged given strict return precautions.    Amount and/or Complexity of Data Reviewed Labs: ordered. Radiology: ordered.  Risk OTC drugs. Prescription drug management.           Final Clinical Impression(s) / ED Diagnoses Final diagnoses:  Chest pain, unspecified type  COVID-19    Rx / DC Orders ED Discharge Orders     None         Gwenevere Abbot, PA-C 06/05/22 5284    Sherwood Gambler, MD 06/08/22 [phone]

## 2022-06-09 ENCOUNTER — Telehealth: Payer: Self-pay

## 2022-06-09 NOTE — Telephone Encounter (Signed)
Transition Care Management Unsuccessful Follow-up Telephone Call  Date of discharge and from where:  06/04/2021 [place]   Attempts:  1st Attempt  Reason for unsuccessful TCM follow-up call:  Left voice message

## 2022-06-12 ENCOUNTER — Telehealth: Payer: Self-pay

## 2022-06-12 NOTE — Telephone Encounter (Signed)
Transition Care Management Unsuccessful Follow-up Telephone Call  Date of discharge and from where:  06/04/2021 [facility]    Attempts:  2nd Attempt  Reason for unsuccessful TCM follow-up call:  Left voice message

## 2022-06-14 ENCOUNTER — Other Ambulatory Visit: Payer: Self-pay | Admitting: Cardiology

## 2022-07-23 ENCOUNTER — Other Ambulatory Visit: Payer: Self-pay | Admitting: Nurse Practitioner

## 2022-07-28 ENCOUNTER — Other Ambulatory Visit: Payer: Self-pay | Admitting: Cardiology

## 2022-08-18 ENCOUNTER — Other Ambulatory Visit: Payer: Self-pay | Admitting: Nurse Practitioner

## 2022-08-27 NOTE — Progress Notes (Addendum)
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   History of Present Illness  01/17/2022  Pulmonary/ 1st office eval/ [facility] / [facility]  Chief Complaint  Patient presents with   Consult    Pt states he has been having some SOB with exertion x1 year  Dyspnea:  MMRC1 = can walk nl pace, flat grade, can't hurry or go uphills or steps s sob   Has trouble carrying objects  Has twinges of back "spasms" (x  few secs) x years mostly on R mid chest seem worse with exertion Cough: none  Sleep: 30 degrees due to back x  years  SABA use: never  Rec Stop losartan and start valsartan 160 one daily in its place Pantoprazole (protonix) 40 mg   Take  30-60 min before first meal of the day and Pepcid (famotidine)  20 mg after supper until return to office - this is the best way to tell whether stomach acid is contributing to your problem.   GERD diet   To get the most out of exercise, you need to be continuously aware that you are short of breath, but never out of breath, for at least 30 minutes daily.  Please remember to go to the  x-ray department c/w copd         02/28/2022  f/u ov/[place] office/Darianny Momon re: doe ? Etiology assoc pseudowheeze  maint on gerd rx  Chief Complaint  Patient presents with   Follow-up    Breathing has improved since last ov    Dyspnea:  walked with wife x one hour some hills no problem  Cough: none  Sleeping: 30 degrees due to back  SABA use: none  02: none  Covid status: vax x 4  Rec To get the most out of exercise, you need to be continuously aware that you are short of breath, but never out of breath, for at least 30 minutes daily. As you improve, it will actually be easier for you to do the same amount of exercise  in  30 minutes so always push  to the level where you are short of breath.   No change in medications    08/29/2022  f/u ov/[place] office/Vianka Ertel re: DOE  maint on gerd rx   Chief Complaint  Patient presents with   Follow-up    Pt f/u states that he is feeling well, he has been monitoring his BP @ home and numbers have been under control   Dyspnea:  improved on gerd rx Cough: correlate with pnds worse year round Sleeping: 30 degree  SABA use: none     No obvious day to day or daytime variability or assoc excess/ purulent sputum or mucus plugs or hemoptysis or cp or chest tightness, subjective wheeze or overt sinus or hb symptoms.   Sleeping as above  without nocturnal  or early am exacerbation  of respiratory  c/o's or need for noct saba. Also denies any obvious fluctuation of symptoms with weather or environmental changes or other aggravating or alleviating factors except as outlined above   No unusual exposure hx or h/o childhood pna/ asthma or knowledge of premature birth.  Current Allergies, Complete Past Medical History, Past Surgical History, Family History,  and Social History were reviewed in [employer] record.  ROS  The following are not active complaints unless bolded Hoarseness, sore throat, dysphagia, dental problems, itching, sneezing,  nasal congestion or discharge of excess mucus or purulent secretions, ear ache,   fever, chills, sweats, unintended wt loss or wt gain, classically pleuritic or exertional cp,  orthopnea pnd or arm/hand swelling  or leg swelling, presyncope, palpitations, abdominal pain, anorexia, nausea, vomiting, diarrhea  or change in bowel habits or change in bladder habits, change in stools or change in urine, dysuria, hematuria,  rash, arthralgias, visual complaints, headache, numbness, weakness or ataxia or problems with walking or coordination,  change in mood or  memory.        Current Meds  Medication Sig   allopurinol (ZYLOPRIM) 300 MG tablet Take 1  tablet by mouth once daily   amLODipine (NORVASC) 10 MG tablet Take 1 tablet by mouth once daily   aspirin EC 81 MG tablet Take 81 mg by mouth daily.   ezetimibe (ZETIA) 10 MG tablet Take 1 tablet by mouth once daily   fluticasone (FLONASE) 50 MCG/ACT nasal spray Place 2 sprays into both nostrils daily.   glucose blood test strip 1 each by Other route as needed for other. Use as instructed   Insulin Pen Needle 32G X 6 MM MISC Use with ozempic   isosorbide mononitrate (IMDUR) 60 MG 24 hr tablet TAKE 1 & 1/2 (ONE & ONE-HALF) TABLETS BY MOUTH ONCE DAILY   linaclotide (LINZESS) 72 MCG capsule Take 72 mcg by mouth every other day.   metFORMIN (GLUCOPHAGE) 500 MG tablet Take 1 tablet (500 mg total) by mouth 2 (two) times daily with a meal.   metoprolol succinate (TOPROL-XL) 50 MG 24 hr tablet Take 1 tablet by mouth once daily   nitroGLYCERIN (NITROSTAT) 0.4 MG SL tablet DISSOLVE ONE TABLET UNDER THE TONGUE EVERY 5 MINUTES AS NEEDED FOR CHEST PAIN.&nbsp;&nbsp;DO NOT EXCEED A TOTAL OF 3 DOSES IN 15 MINUTES   oxymetazoline (AFRIN) 0.05 % nasal spray Place 1 spray into both nostrils 2 (two) times daily as needed for congestion.   pantoprazole (PROTONIX) 40 MG tablet Take 1 tablet (40 mg total) by mouth daily. Take 30-60 min before first meal of the day   rosuvastatin (CRESTOR) 10 MG tablet TAKE 1 TABLET BY MOUTH ON MONDAY, WEDNESDAY AND FRIDAY   Semaglutide, 1 MG/DOSE, (OZEMPIC, 1 MG/DOSE,) 4 MG/3ML SOPN Inject 1 mg into the skin once a week.   triamcinolone ointment (KENALOG) 0.5 % Apply 1 Application topically 2 (two) times daily.   valsartan (DIOVAN) 160 MG tablet Take 1 tablet (160 mg total) by mouth daily.                         Past Medical History:  Diagnosis Date   Arthritis    CAD (coronary artery disease)    a. 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 Tarri Omarion on 05/08/2021 -Pigmented   Unstable angina (  [place]) 07/2017        Objective:    Wts  08/29/2022        157  02/28/2022        159   01/17/22 157 lb 6.4 oz (71.4 kg)  01/01/22 159 lb (72.1 kg)  11/07/21 162 lb 9.6 oz (73.8 kg)    Vital signs reviewed  08/29/2022  - Note at rest 02 sats  96% on RA   General appearance:    pleasant amb bm nad   HEENT : Oropharynx  clear  / full dentures        NECK :  without  apparent JVD/ palpable Nodes/TM    LUNGS: no acc muscle use,  Nl contour chest which is clear to A and P bilaterally without cough on insp or exp maneuvers   CV:  RRR  no s3 or murmur or increase in P2, and no edema   ABD:  soft and nontender with nl inspiratory excursion in the supine position. No bruits or organomegaly appreciated   MS:  Nl gait/ ext warm without deformities Or obvious joint restrictions  calf tenderness, cyanosis or clubbing    SKIN: warm and dry without lesions    NEURO:  alert, approp, nl sensorium with  no motor or cerebellar deficits apparent.           Assessment

## 2022-08-29 ENCOUNTER — Ambulatory Visit: Payer: Medicare HMO | Admitting: Internal Medicine

## 2022-08-29 ENCOUNTER — Encounter: Payer: Self-pay | Admitting: Internal Medicine

## 2022-08-29 VITALS — BP 109/68 | HR 94 | Ht 67.0 in | Wt 157.2 lb

## 2022-08-29 DIAGNOSIS — I10 Essential (primary) hypertension: Secondary | ICD-10-CM

## 2022-08-29 DIAGNOSIS — R0609 Other forms of dyspnea: Secondary | ICD-10-CM | POA: Diagnosis not present

## 2022-08-29 NOTE — Patient Instructions (Signed)
Stop pantoprazole (the one before bfast) and change Pepcid 20 mg (over the counter)  to where you take it after breakfast and supper x 2  week, then just take at bedtime for 2 weeks then stop.    If you are satisfied with your treatment plan,  let your doctor know and he/she can either refill your medications or you can return here when your prescription runs out.     If in any way you are not 100% satisfied,  please tell us.  If 100% better, tell your friends!  Pulmonary follow up is as needed

## 2022-08-29 NOTE — Assessment & Plan Note (Signed)
Changed losartan to valsartan 160 mg one daily 01/17/2022 due to [place] on exam > resolved 02/28/2022 though not sure this was the issue as also may have responed to GERD rx  Adequate control on present rx, reviewed in detail with pt > no change in rx needed  > f/u PCP   Pulmonary f/u prn     Each maintenance medication was reviewed in detail including emphasizing most importantly the difference between maintenance and prns and under what circumstances the prns are to be triggered using an action plan format where appropriate.  Total time for H and P, chart review, counseling,  and generating customized AVS unique to this office visit / same day charting = 25 min

## 2022-08-29 NOTE — Assessment & Plan Note (Signed)
Onset 2022 with raspy pseudowheeze on exam 01/17/2022  - Echo [id number]  Grad 1 diastolic dysfunction with trivial MR/ nl LA - PFT's  11/14/21   FEV1 2.47 (97 % ) ratio 0.82  p 8 % improvement from saba p ? prior to study with DLCO  13.62  (58%) and FV curve nl   -  01/17/2022   Walked on RA  x  3  lap(s) =  approx 450  ft  @ very fast pace, stopped due to end of study s sob  with lowest 02 sats 96%  - 02/28/2022  Improved, no longer pseudowheezing on gerd rx and off losartan   Resolved on gerd rx > Discussed the recent press about ppi's in the context of a statistically significant (but questionably clinically relevant) increase in CRI in pts on ppi vs h2's > bottom line is the lowest dose of ppi that controls   gerd is the right dose and if that dose is zero that's fine esp since h2's are cheaper.   See AVS for taper off ppi > f/u PCP/ gi prn flare and no further pulmoanry f/u needed

## 2022-09-09 LAB — HM DIABETES EYE EXAM

## 2022-09-24 ENCOUNTER — Ambulatory Visit (INDEPENDENT_AMBULATORY_CARE_PROVIDER_SITE_OTHER): Payer: Medicare HMO | Admitting: Nurse Practitioner

## 2022-09-24 ENCOUNTER — Encounter: Payer: Self-pay | Admitting: Nurse Practitioner

## 2022-09-24 ENCOUNTER — Ambulatory Visit (INDEPENDENT_AMBULATORY_CARE_PROVIDER_SITE_OTHER): Payer: Medicare HMO

## 2022-09-24 VITALS — BP 110/62 | HR 89 | Temp 97.9°F | Ht 67.0 in | Wt 153.0 lb

## 2022-09-24 VITALS — BP 110/62 | HR 89 | Temp 97.9°F | Ht 67.0 in | Wt 153.4 lb

## 2022-09-24 DIAGNOSIS — Z0000 Encounter for general adult medical examination without abnormal findings: Secondary | ICD-10-CM | POA: Diagnosis not present

## 2022-09-24 DIAGNOSIS — C61 Malignant neoplasm of prostate: Secondary | ICD-10-CM | POA: Diagnosis not present

## 2022-09-24 DIAGNOSIS — I119 Hypertensive heart disease without heart failure: Secondary | ICD-10-CM

## 2022-09-24 DIAGNOSIS — E1159 Type 2 diabetes mellitus with other circulatory complications: Secondary | ICD-10-CM

## 2022-09-24 DIAGNOSIS — Z79899 Other long term (current) drug therapy: Secondary | ICD-10-CM

## 2022-09-24 DIAGNOSIS — Z23 Encounter for immunization: Secondary | ICD-10-CM | POA: Diagnosis not present

## 2022-09-24 DIAGNOSIS — I25119 Atherosclerotic heart disease of native coronary artery with unspecified angina pectoris: Secondary | ICD-10-CM | POA: Diagnosis not present

## 2022-09-24 NOTE — Progress Notes (Signed)
Subjective:   Ronald Faulkner is a 72 y.o. male who presents for Medicare Annual/Subsequent preventive examination.  Review of Systems     Cardiac Risk Factors include: advanced age (>19men, >22 women);diabetes mellitus;hypertension;male gender     Objective:    Today's Vitals   09/24/22 1143  BP: 110/62  Pulse: 89  Temp: 97.9 F (36.6 C)  TempSrc: Oral  SpO2: 97%  Weight: 153 lb 6.4 oz (69.6 kg)  Height: 5\' 7"  (1.702 m)   Body mass index is 24.03 kg/m.     09/24/2022   11:50 AM 06/04/2022    3:54 PM 09/26/2021    2:00 PM 09/05/2020    2:45 PM 10/28/2019    8:11 AM 07/13/2019    9:12 AM 06/28/2018   10:30 AM  Advanced Directives  Does Patient Have a Medical Advance Directive? No No No No No No No  Would patient like information on creating a medical advance directive?  No - Patient declined   No - Patient declined Yes (MAU/Ambulatory/Procedural Areas - Information given)     Current Medications (verified) Outpatient Encounter Medications as of 09/24/2022  Medication Sig   allopurinol (ZYLOPRIM) 300 MG tablet Take 1 tablet by mouth once daily   amLODipine (NORVASC) 10 MG tablet Take 1 tablet by mouth once daily   aspirin EC 81 MG tablet Take 81 mg by mouth daily.   ezetimibe (ZETIA) 10 MG tablet Take 1 tablet by mouth once daily   famotidine (PEPCID) 20 MG tablet Take 20 mg by mouth daily.   fluticasone (FLONASE) 50 MCG/ACT nasal spray Place 2 sprays into both nostrils daily.   glucose blood test strip 1 each by Other route as needed for other. Use as instructed   Insulin Pen Needle 32G X 6 MM MISC Use with ozempic   isosorbide mononitrate (IMDUR) 60 MG 24 hr tablet TAKE 1 & 1/2 (ONE & ONE-HALF) TABLETS BY MOUTH ONCE DAILY   linaclotide (LINZESS) 72 MCG capsule Take 72 mcg by mouth every other day.   metFORMIN (GLUCOPHAGE) 500 MG tablet Take 1 tablet (500 mg total) by mouth 2 (two) times daily with a meal.   metoprolol succinate (TOPROL-XL) 50 MG 24 hr tablet Take 1 tablet  by mouth once daily   nitroGLYCERIN (NITROSTAT) 0.4 MG SL tablet DISSOLVE ONE TABLET UNDER THE TONGUE EVERY 5 MINUTES AS NEEDED FOR CHEST PAIN.&nbsp;&nbsp;DO NOT EXCEED A TOTAL OF 3 DOSES IN 15 MINUTES   oxymetazoline (AFRIN) 0.05 % nasal spray Place 1 spray into both nostrils 2 (two) times daily as needed for congestion.   pantoprazole (PROTONIX) 40 MG tablet Take 1 tablet (40 mg total) by mouth daily. Take 30-60 min before first meal of the day   rosuvastatin (CRESTOR) 10 MG tablet TAKE 1 TABLET BY MOUTH ON MONDAY, WEDNESDAY AND FRIDAY   Semaglutide, 1 MG/DOSE, (OZEMPIC, 1 MG/DOSE,) 4 MG/3ML SOPN Inject 1 mg into the skin once a week.   triamcinolone ointment (KENALOG) 0.5 % Apply 1 Application topically 2 (two) times daily.   valsartan (DIOVAN) 160 MG tablet Take 1 tablet (160 mg total) by mouth daily.   No facility-administered encounter medications on file as of 09/24/2022.    Allergies (verified) Bee venom, Fish allergy, and Ivp dye [iodinated contrast media]   History: Past Medical History:  Diagnosis Date   Arthritis    CAD (coronary artery disease)    a. 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 Orvan Falconer on 05/08/2021 -Pigmented   Unstable angina 07/2017   Past Surgical History:  Procedure Laterality Date   ABDOMINAL SURGERY     APPENDECTOMY     BREAST SURGERY     benign lump at lt side   COLON SURGERY     COLONOSCOPY  03/14/2011   Procedure: COLONOSCOPY;  Surgeon: Malissa Hippo, MD;  Location: [facility];  Service: Endoscopy;  Laterality: N/A;  1:00   COLONOSCOPY N/A 11/15/2015    Procedure: COLONOSCOPY;  Surgeon: Malissa Hippo, MD;  Location: [facility];  Service: Endoscopy;  Laterality: N/A;  11:15   COLONOSCOPY WITH PROPOFOL N/A 07/15/2019   Procedure: COLONOSCOPY WITH PROPOFOL;  Surgeon: Malissa Hippo, MD;  Location: [facility];  Service: Endoscopy;  Laterality: N/A;  1235   CORONARY STENT INTERVENTION N/A 07/31/2017   Procedure: CORONARY STENT INTERVENTION;  Surgeon: Kathleene Hazel, MD;  Location: [facility];  Service: Cardiovascular;  Laterality: N/A;   LEFT HEART CATH AND CORONARY ANGIOGRAPHY N/A 07/31/2017   Procedure: LEFT HEART CATH AND CORONARY ANGIOGRAPHY;  Surgeon: Kathleene Hazel, MD;  Location: [facility];  Service: Cardiovascular;  Laterality: N/A;   LEFT HEART CATH AND CORONARY ANGIOGRAPHY N/A 10/28/2019   Procedure: LEFT HEART CATH AND CORONARY ANGIOGRAPHY;  Surgeon: Lyn Records, MD;  Location: [facility];  Service: Cardiovascular;  Laterality: N/A;   PENILE PROSTHESIS IMPLANT     2016    prostate cancer     2015. prostectomy   Family History  Problem Relation Age of Onset   Colon cancer Mother    Coronary artery disease Maternal Grandmother    Anesthesia problems Neg Hx    Hypotension Neg Hx    Malignant hyperthermia Neg Hx    Pseudochol deficiency Neg Hx    Social History   Socioeconomic History   Marital status: Married    Spouse name: Not on file   Number of children: 2   Years of education: Not on file   Highest education level: Not on file  Occupational History    Employer: [employer]  Tobacco Use   Smoking status: Former    Packs/day: 1.00    Years: 35.00    Additional pack years: 0.00    Total pack years: 35.00    Types: Cigarettes    Quit date: 06/03/1999    Years since quitting: 23.3   Smokeless tobacco: Never  Vaping Use   Vaping Use: Never used  Substance and Sexual Activity   Alcohol use: Yes    Comment: occ   Drug use: No    Comment: hx of use in his colege  days   Sexual activity: Yes  Other Topics Concern   Not on file  Social History Narrative   Lives at home with wife and daughter and her three children.     Social Determinants of Health   Financial Resource Strain: Low Risk  (09/24/2022)   Overall Financial Resource Strain (CARDIA)    Difficulty of Paying Living Expenses: Not hard  at all  Food Insecurity: No Food Insecurity (09/24/2022)   Hunger Vital Sign    Worried About Running Out of Food in the Last Year: Never true    Ran Out of Food in the Last Year: Never true  Transportation Needs: No Transportation Needs (09/24/2022)   PRAPARE - Administrator, Civil Service (Medical): No    Lack of Transportation (Non-Medical): No  Physical Activity: Inactive (09/24/2022)   Exercise Vital Sign    Days of Exercise per Week: 0 days    Minutes of Exercise per Session: 0 min  Stress: No Stress Concern Present (09/24/2022)   [employer] of Occupational Health - Occupational Stress Questionnaire    Feeling of Stress : Not at all  Social Connections: Not on file    Tobacco Counseling Counseling given: Not Answered   Clinical Intake:  Pre-visit preparation completed: Yes  Pain : No/denies pain     Nutritional Status: BMI of 19-24  Normal Nutritional Risks: None Diabetes: Yes  How often do you need to have someone help you when you read instructions, pamphlets, or other written materials from your doctor or pharmacy?: 1 - Never  Diabetic? Yes Nutrition Risk Assessment:  Has the patient had any N/V/D within the last 2 months?  No  Does the patient have any non-healing wounds?  No  Has the patient had any unintentional weight loss or weight gain?  No   Diabetes:  Is the patient diabetic?  Yes  If diabetic, was a CBG obtained today?  No  Did the patient bring in their glucometer from home?  No  How often do you monitor your CBG's? daily.   Financial Strains and Diabetes Management:  Are you having any  financial strains with the device, your supplies or your medication? No .  Does the patient want to be seen by Chronic Care Management for management of their diabetes?  No  Would the patient like to be referred to a Nutritionist or for Diabetic Management?  No   Diabetic Exams:  Diabetic Eye Exam: Completed 2024 Diabetic Foot Exam: Completed 01/01/2022   Interpreter Needed?: No  Information entered by :: NAllen LPN   Activities of Daily Living    09/24/2022   11:50 AM 09/26/2021    2:01 PM  In your present state of health, do you have any difficulty performing the following activities:  Hearing? 0 0  Vision? 0 0  Difficulty concentrating or making decisions? 1 1  Comment some memory   Walking or climbing stairs? 0 0  Dressing or bathing? 0 0  Doing errands, shopping? 0 0  Preparing Food and eating ? N N  Using the Toilet? N N  In the past six months, have you accidently leaked urine? Y Y  Comment due to prostate surgery had prostate removed  Do you have problems with loss of bowel control? N N  Managing your Medications? N N  Managing your Finances? N N  Housekeeping or managing your Housekeeping? N N    Patient Care Team: [name], FNP as PCP - General (General Practice) Wyline Mood Dorothe Pea, MD as PCP - Cardiology (Cardiology) Harlan Stains, [facility] (Pharmacist)  Indicate any recent Medical Services you may have received from other than [facility] providers in the past year (date may be approximate).     Assessment:   This is a routine wellness examination for [facility].  Hearing/Vision screen Vision Screening - Comments:: Regular eye exams [facility]  Dietary  issues and exercise activities discussed: Current Exercise Habits: The patient does not participate in regular exercise at present   Goals Addressed             This Visit's Progress    Patient Stated       09/24/2022, get off some medications       Depression Screen    09/24/2022   11:50 AM  05/07/2022   10:27 AM 01/01/2022   10:27 AM 09/26/2021    2:01 PM 09/12/2021    3:03 PM 09/05/2020    2:46 PM 06/05/2020    2:18 PM  PHQ 2/9 Scores  PHQ - 2 Score 0 0 0 0 0 0 0  PHQ- 9 Score     0      Fall Risk    09/24/2022   11:50 AM 05/07/2022   10:27 AM 01/01/2022   10:27 AM 09/26/2021    2:01 PM 09/12/2021    3:03 PM  Fall Risk   Falls in the past year? 0 0 0 0 0  Number falls in past yr: 0 0 0 0 0  Injury with Fall? 0 0 0 0 0  Risk for fall due to : Medication side effect No Fall Risks No Fall Risks Mental status change No Fall Risks  Follow up Falls prevention discussed;Education provided;Falls evaluation completed Falls evaluation completed Falls evaluation completed Falls evaluation completed;Education provided;Falls prevention discussed Falls evaluation completed    FALL RISK PREVENTION PERTAINING TO THE HOME:  Any stairs in or around the home? Yes  If so, are there any without handrails? No  Home free of loose throw rugs in walkways, pet beds, electrical cords, etc? Yes  Adequate lighting in your home to reduce risk of falls? Yes   ASSISTIVE DEVICES UTILIZED TO PREVENT FALLS:  Life alert? No  Use of a cane, walker or w/c? No  Grab bars in the bathroom? No  Shower chair or bench in shower? No  Elevated toilet seat or a handicapped toilet? Yes   TIMED UP AND GO:  Was the test performed? Yes .  Length of time to ambulate 10 feet: 5 sec.   Gait steady and fast without use of assistive device  Cognitive Function:        09/24/2022   11:52 AM 09/26/2021    2:02 PM 09/05/2020    2:48 PM  6CIT Screen  What Year? 0 points 0 points 0 points  What month? 0 points 0 points 0 points  What time? 0 points 0 points 0 points  Count back from 20 2 points 0 points 0 points  Months in reverse 0 points 0 points 0 points  Repeat phrase 2 points 2 points 2 points  Total Score 4 points 2 points 2 points    Immunizations Immunization History  Administered Date(s) Administered    Fluad Quad(high Dose 65+) 02/27/2021, 02/28/2022   Influenza, High Dose Seasonal PF 05/27/2017   Influenza-Unspecified 03/14/2020   Moderna Sars-Covid-2 Vaccination 07/28/2019, 08/26/2019, 06/05/2020   PNEUMOCOCCAL CONJUGATE-20 09/05/2020   Tdap 09/05/2020   Zoster Recombinat (Shingrix) 12/20/2020, 02/19/2021    TDAP status: Up to date  Flu Vaccine status: Up to date  Pneumococcal vaccine status: Up to date  Covid-19 vaccine status: Completed vaccines  Qualifies for Shingles Vaccine? Yes   Zostavax completed Yes   Shingrix Completed?: Yes  Screening Tests Health Maintenance  Topic Date Due   OPHTHALMOLOGY EXAM  08/22/2021   COVID-19 Vaccine (4 - 2023-24 season) 01/31/2022  Medicare Annual Wellness (AWV)  09/27/2022   HEMOGLOBIN A1C  11/06/2022   INFLUENZA VACCINE  01/01/2023   Diabetic kidney evaluation - Urine ACR  01/02/2023   FOOT EXAM  01/02/2023   Diabetic kidney evaluation - eGFR measurement  06/05/2023   COLONOSCOPY (Pts 45-17yrs Insurance coverage will need to be confirmed)  07/14/2024   DTaP/Tdap/Td (2 - Td or Tdap) 09/06/2030   Pneumonia Vaccine 10+ Years old  Completed   Hepatitis C Screening  Completed   Zoster Vaccines- Shingrix  Completed   HPV VACCINES  Aged Out    Health Maintenance  Health Maintenance Due  Topic Date Due   OPHTHALMOLOGY EXAM  08/22/2021   COVID-19 Vaccine (4 - 2023-24 season) 01/31/2022   Medicare Annual Wellness (AWV)  09/27/2022    Colorectal cancer screening: Type of screening: Colonoscopy. Completed 07/15/2019. Repeat every 5 years  Lung Cancer Screening: (Low Dose CT Chest recommended if Age 24-80 years, 30 pack-year currently smoking OR have quit w/in 15years.) does not qualify.   Lung Cancer Screening Referral: no  Additional Screening:  Hepatitis C Screening: does qualify; Completed 09/11/2020  Vision Screening: Recommended annual ophthalmology exams for early detection of glaucoma and other disorders of the eye. Is  the patient up to date with their annual eye exam?  Yes  Who is the provider or what is the name of the office in which the patient attends annual eye exams? [facility] If pt is not established with a provider, would they like to be referred to a provider to establish care? No .   Dental Screening: Recommended annual dental exams for proper oral hygiene  Community Resource Referral / Chronic Care Management: CRR required this visit?  No   CCM required this visit?  No      Plan:     I have personally reviewed and noted the following in the patient's chart:   Medical and social history Use of alcohol, tobacco or illicit drugs  Current medications and supplements including opioid prescriptions. Patient is not currently taking opioid prescriptions. Functional ability and status Nutritional status Physical activity Advanced directives List of other physicians Hospitalizations, surgeries, and ER visits in previous 12 months Vitals Screenings to include cognitive, depression, and falls Referrals and appointments  In addition, I have reviewed and discussed with patient certain preventive protocols, quality metrics, and best practice recommendations. A written personalized care plan for preventive services as well as general preventive health recommendations were provided to patient.     Barb Merino, LPN   01/29/5620   Nurse Notes: none

## 2022-09-24 NOTE — Progress Notes (Signed)
I,Sheena H Holbrook,acting as a Neurosurgeon for [name], FNP.,have documented all relevant documentation on the behalf of [name], FNP,as directed by  [name], FNP while in the presence of [name], FNP.    Subjective:     Patient ID: Ronald Faulkner , male    DOB: [birth year] , 72 y.o.   MRN: [id number]   Chief Complaint  Patient presents with   Medical Management of Chronic Issues    HPI  Patient presents today for DM follow up. He also had his AWV with Herbie Saxon  He is receiving his Ozempic with patient assistance   Diabetes He presents for his follow-up diabetic visit. He has type 2 diabetes mellitus. No MedicAlert identification noted. His disease course has been improving. There are no hypoglycemic associated symptoms. Pertinent negatives for hypoglycemia include no dizziness or headaches. There are no diabetic associated symptoms. There are no hypoglycemic complications. Symptoms are stable. There are no diabetic complications. Risk factors for coronary artery disease include diabetes mellitus, male sex, sedentary lifestyle and hypertension. Current diabetic treatment includes oral agent (dual therapy) (Ozempic and metformin). He is following a diabetic diet. When asked about meal planning, he reported none. He has not had a previous visit with a dietitian. He rarely participates in exercise. (Blood sugar ranging 92- 135) An ACE inhibitor/angiotensin II receptor blocker is being taken.  Hypertension This is a chronic problem. The current episode started 1 to 4 weeks ago. The problem has been gradually worsening since onset. The problem is uncontrolled. Pertinent negatives include no headaches, peripheral edema or shortness of breath (when lifting a heavy object and walking). There are no associated agents to hypertension. Risk factors for coronary artery disease include sedentary lifestyle. Past treatments include angiotensin blockers, calcium channel blockers and direct  vasodilators. The current treatment provides moderate improvement. There are no compliance problems.  There is no history of angina. There is no history of chronic renal disease.     Past Medical History:  Diagnosis Date   Arthritis    CAD (coronary artery disease)    a. 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 Orvan Falconer on 05/08/2021 -Pigmented   Unstable angina 07/2017     Family History  Problem Relation Age of Onset   Colon cancer Mother    Coronary artery disease Maternal Grandmother    Anesthesia problems Neg Hx    Hypotension Neg Hx    Malignant hyperthermia Neg Hx    Pseudochol deficiency Neg Hx      Current Outpatient Medications:    famotidine (PEPCID) 20 MG tablet, Take 20 mg by mouth daily., Disp: , Rfl:    allopurinol (ZYLOPRIM) 300 MG tablet, Take 1 tablet by mouth once daily, Disp: 90 tablet, Rfl: 0   amLODipine (NORVASC) 10 MG tablet, Take 1 tablet by mouth once daily, Disp: 90 tablet, Rfl: 3   aspirin EC 81 MG tablet, Take 81 mg by mouth daily., Disp: , Rfl:    ezetimibe (ZETIA) 10 MG tablet, Take 1 tablet by mouth once  daily, Disp: 90 tablet, Rfl: 1   fluticasone (FLONASE) 50 MCG/ACT nasal spray, Place 2 sprays into both nostrils daily., Disp: , Rfl:    glucose blood test strip, 1 each by Other route as needed for other. Use as instructed, Disp: 100 each, Rfl: 6   Insulin Pen Needle 32G X 6 MM MISC, Use with ozempic, Disp: 50 each, Rfl: 3   isosorbide mononitrate (IMDUR) 60 MG 24 hr tablet, TAKE 1 & 1/2 (ONE & ONE-HALF) TABLETS BY MOUTH ONCE DAILY, Disp: 135 tablet, Rfl: 3    linaclotide (LINZESS) 72 MCG capsule, Take 72 mcg by mouth every other day., Disp: , Rfl:    metFORMIN (GLUCOPHAGE) 500 MG tablet, Take 1 tablet (500 mg total) by mouth 2 (two) times daily with a meal., Disp: 180 tablet, Rfl: 3   metoprolol succinate (TOPROL-XL) 50 MG 24 hr tablet, Take 1 tablet by mouth once daily, Disp: 90 tablet, Rfl: 1   nitroGLYCERIN (NITROSTAT) 0.4 MG SL tablet, DISSOLVE ONE TABLET UNDER THE TONGUE EVERY 5 MINUTES AS NEEDED FOR CHEST PAIN.&nbsp;&nbsp;DO NOT EXCEED A TOTAL OF 3 DOSES IN 15 MINUTES, Disp: 25 tablet, Rfl: 0   oxymetazoline (AFRIN) 0.05 % nasal spray, Place 1 spray into both nostrils 2 (two) times daily as needed for congestion., Disp: , Rfl:    pantoprazole (PROTONIX) 40 MG tablet, Take 1 tablet (40 mg total) by mouth daily. Take 30-60 min before first meal of the day, Disp: 30 tablet, Rfl: 2   rosuvastatin (CRESTOR) 10 MG tablet, TAKE 1 TABLET BY MOUTH ON MONDAY, WEDNESDAY AND FRIDAY, Disp: 36 tablet, Rfl: 0   Semaglutide, 1 MG/DOSE, (OZEMPIC, 1 MG/DOSE,) 4 MG/3ML SOPN, Inject 1 mg into the skin once a week., Disp: 9 mL, Rfl: 1   triamcinolone ointment (KENALOG) 0.5 %, Apply 1 Application topically 2 (two) times daily., Disp: 30 g, Rfl: 0   valsartan (DIOVAN) 160 MG tablet, Take 1 tablet (160 mg total) by mouth daily., Disp: 30 tablet, Rfl: 11   Allergies  Allergen Reactions   Bee Venom    Fish Allergy Hives and Swelling   Ivp Dye [Iodinated Contrast Media] Swelling     Review of Systems  Respiratory:  Negative for shortness of breath (when lifting a heavy object and walking).   Neurological:  Negative for dizziness and headaches.     Today's Vitals   09/24/22 1224  BP: 110/62  Pulse: 89  Temp: 97.9 F (36.6 C)  TempSrc: Oral  SpO2: 97%  Weight: 153 lb (69.4 kg)  Height: 5\' 7"  (1.702 m)   Body mass index is 23.96 kg/m.   Objective:  Physical Exam Vitals reviewed.  Constitutional:      General: He is not in acute distress.    Appearance:  Normal appearance.  Cardiovascular:     Rate and Rhythm: Normal rate and regular rhythm.     Pulses: Normal pulses.     Heart sounds: Normal heart sounds. No murmur heard. Pulmonary:     Effort: Pulmonary effort is normal. No respiratory distress.     Breath sounds: Normal breath sounds. No wheezing.  Skin:    General: Skin is warm and dry.     Coloration: Skin is not jaundiced.     Findings: No rash.  Neurological:     General: No focal deficit present.     Mental Status: He is alert and oriented to person, place, and time.     Cranial Nerves: No cranial nerve deficit.  Motor: No weakness.  Psychiatric:        Mood and Affect: Mood normal.        Behavior: Behavior normal.        Thought Content: Thought content normal.        Judgment: Judgment normal.        Assessment And Plan:     1. Type 2 diabetes mellitus with other circulatory complication, without long-term current use of insulin Comments: HgbA1c is better, continue current medications. - Hemoglobin A1c - Lipid panel - CMP14 + Anion Gap  2. Hypertensive heart disease without heart failure Comments: Blood pressue is well controlled continue current medications.  3. Coronary artery disease involving native coronary artery of native heart with angina pectoris Comments: Continue f/u with Cardiology  4. Malignant neoplasm of prostate Comments: F/u with Dr. Gillian Shields. Dr. Lindell Spar has released him from his care - PSA  5. Other long term (current) drug therapy - TSH  6. Need for COVID-19 vaccine Covid 19 vaccine given in office observed for 15 minutes without any adverse reaction - Pfizer Fall 2023 Covid-19 Vaccine 64yrs and older    Patient was given opportunity to ask questions. Patient verbalized understanding of the plan and was able to repeat key elements of the plan. All questions were answered to their satisfaction.  [name], FNP   I, [name], FNP, have reviewed all documentation for this  visit. The documentation on 09/24/22 for the exam, diagnosis, procedures, and orders are all accurate and complete.   IF YOU HAVE BEEN REFERRED TO A SPECIALIST, IT MAY TAKE 1-2 WEEKS TO SCHEDULE/PROCESS THE REFERRAL. IF YOU HAVE NOT HEARD FROM US/SPECIALIST IN TWO WEEKS, PLEASE GIVE [place] A CALL AT [phone] X [phone].   THE PATIENT IS ENCOURAGED TO PRACTICE SOCIAL DISTANCING DUE TO THE COVID-19 PANDEMIC.

## 2022-09-24 NOTE — Patient Instructions (Signed)
Ronald Faulkner , Thank you for taking time to come for your Medicare Wellness Visit. I appreciate your ongoing commitment to your health goals. Please review the following plan we discussed and let me know if I can assist you in the future.   These are the goals we discussed:  Goals      Manage My Medicine     Timeframe:  Long-Range Goal Priority:  High Start Date:                             Expected End Date:                       Follow Up Date 03/04/2022  In Progress:   - call for medicine refill 2 or 3 days before it runs out - call if I am sick and can't take my medicine - keep a list of all the medicines I take; vitamins and herbals too - learn to read medicine labels - use a pillbox to sort medicine - use an alarm clock or phone to remind me to take my medicine    Why is this important?   These steps will help you keep on track with your medicines.        Patient Stated     09/05/2020, wants to lose stomach     Patient Stated     09/26/2021, decrease medication     Patient Stated     09/24/2022, get off some medications        This is a list of the screening recommended for you and due dates:  Health Maintenance  Topic Date Due   Eye exam for diabetics  08/22/2021   COVID-19 Vaccine (4 - 2023-24 season) 01/31/2022   Hemoglobin A1C  11/06/2022   Flu Shot  01/01/2023   Yearly kidney health urinalysis for diabetes  01/02/2023   Complete foot exam   01/02/2023   Yearly kidney function blood test for diabetes  06/05/2023   Medicare Annual Wellness Visit  09/24/2023   Colon Cancer Screening  07/14/2024   DTaP/Tdap/Td vaccine (2 - Td or Tdap) 09/06/2030   Pneumonia Vaccine  Completed   Hepatitis C Screening: USPSTF Recommendation to screen - Ages 37-79 yo.  Completed   Zoster (Shingles) Vaccine  Completed   HPV Vaccine  Aged Out    Advanced directives: Advance directive discussed with you today. Even though you declined this today please call our office should you  change your mind and we can give you the proper paperwork for you to fill out.  Conditions/risks identified: none  Next appointment: Follow up in one year for your annual wellness visit.   Preventive Care 37 Years and Older, Male  Preventive care refers to lifestyle choices and visits with your health care provider that can promote health and wellness. What does preventive care include? A yearly physical exam. This is also called an annual well check. Dental exams once or twice a year. Routine eye exams. Ask your health care provider how often you should have your eyes checked. Personal lifestyle choices, including: Daily care of your teeth and gums. Regular physical activity. Eating a healthy diet. Avoiding tobacco and drug use. Limiting alcohol use. Practicing safe sex. Taking low doses of aspirin every day. Taking vitamin and mineral supplements as recommended by your health care provider. What happens during an annual well check? The services and screenings done by your  health care provider during your annual well check will depend on your age, overall health, lifestyle risk factors, and family history of disease. Counseling  Your health care provider may ask you questions about your: Alcohol use. Tobacco use. Drug use. Emotional well-being. Home and relationship well-being. Sexual activity. Eating habits. History of falls. Memory and ability to understand (cognition). Work and work Astronomer. Screening  You may have the following tests or measurements: Height, weight, and BMI. Blood pressure. Lipid and cholesterol levels. These may be checked every 5 years, or more frequently if you are over 32 years old. Skin check. Lung cancer screening. You may have this screening every year starting at age 34 if you have a 30-pack-year history of smoking and currently smoke or have quit within the past 15 years. Fecal occult blood test (FOBT) of the stool. You may have this test  every year starting at age 26. Flexible sigmoidoscopy or colonoscopy. You may have a sigmoidoscopy every 5 years or a colonoscopy every 10 years starting at age 75. Prostate cancer screening. Recommendations will vary depending on your family history and other risks. Hepatitis C blood test. Hepatitis B blood test. Sexually transmitted disease (STD) testing. Diabetes screening. This is done by checking your blood sugar (glucose) after you have not eaten for a while (fasting). You may have this done every 1-3 years. Abdominal aortic aneurysm (AAA) screening. You may need this if you are a current or former smoker. Osteoporosis. You may be screened starting at age 8 if you are at high risk. Talk with your health care provider about your test results, treatment options, and if necessary, the need for more tests. Vaccines  Your health care provider may recommend certain vaccines, such as: Influenza vaccine. This is recommended every year. Tetanus, diphtheria, and acellular pertussis (Tdap, Td) vaccine. You may need a Td booster every 10 years. Zoster vaccine. You may need this after age 79. Pneumococcal 13-valent conjugate (PCV13) vaccine. One dose is recommended after age 9. Pneumococcal polysaccharide (PPSV23) vaccine. One dose is recommended after age 16. Talk to your health care provider about which screenings and vaccines you need and how often you need them. This information is not intended to replace advice given to you by your health care provider. Make sure you discuss any questions you have with your health care provider. Document Released: 06/15/2015 Document Revised: 02/06/2016 Document Reviewed: 03/20/2015 Elsevier Interactive Patient Education  2017 [employer].  Fall Prevention in the Home Falls can cause injuries. They can happen to people of all ages. There are many things you can do to make your home safe and to help prevent falls. What can I do on the outside of my  home? Regularly fix the edges of walkways and driveways and fix any cracks. Remove anything that might make you trip as you walk through a door, such as a raised step or threshold. Trim any bushes or trees on the path to your home. Use bright outdoor lighting. Clear any walking paths of anything that might make someone trip, such as rocks or tools. Regularly check to see if handrails are loose or broken. Make sure that both sides of any steps have handrails. Any raised decks and porches should have guardrails on the edges. Have any leaves, snow, or ice cleared regularly. Use sand or salt on walking paths during winter. Clean up any spills in your garage right away. This includes oil or grease spills. What can I do in the bathroom? Use night lights.  Install grab bars by the toilet and in the tub and shower. Do not use towel bars as grab bars. Use non-skid mats or decals in the tub or shower. If you need to sit down in the shower, use a plastic, non-slip stool. Keep the floor dry. Clean up any water that spills on the floor as soon as it happens. Remove soap buildup in the tub or shower regularly. Attach bath mats securely with double-sided non-slip rug tape. Do not have throw rugs and other things on the floor that can make you trip. What can I do in the bedroom? Use night lights. Make sure that you have a light by your bed that is easy to reach. Do not use any sheets or blankets that are too big for your bed. They should not hang down onto the floor. Have a firm chair that has side arms. You can use this for support while you get dressed. Do not have throw rugs and other things on the floor that can make you trip. What can I do in the kitchen? Clean up any spills right away. Avoid walking on wet floors. Keep items that you use a lot in easy-to-reach places. If you need to reach something above you, use a strong step stool that has a grab bar. Keep electrical cords out of the way. Do  not use floor polish or wax that makes floors slippery. If you must use wax, use non-skid floor wax. Do not have throw rugs and other things on the floor that can make you trip. What can I do with my stairs? Do not leave any items on the stairs. Make sure that there are handrails on both sides of the stairs and use them. Fix handrails that are broken or loose. Make sure that handrails are as long as the stairways. Check any carpeting to make sure that it is firmly attached to the stairs. Fix any carpet that is loose or worn. Avoid having throw rugs at the top or bottom of the stairs. If you do have throw rugs, attach them to the floor with carpet tape. Make sure that you have a light switch at the top of the stairs and the bottom of the stairs. If you do not have them, ask someone to add them for you. What else can I do to help prevent falls? Wear shoes that: Do not have high heels. Have rubber bottoms. Are comfortable and fit you well. Are closed at the toe. Do not wear sandals. If you use a stepladder: Make sure that it is fully opened. Do not climb a closed stepladder. Make sure that both sides of the stepladder are locked into place. Ask someone to hold it for you, if possible. Clearly mark and make sure that you can see: Any grab bars or handrails. First and last steps. Where the edge of each step is. Use tools that help you move around (mobility aids) if they are needed. These include: Canes. Walkers. Scooters. Crutches. Turn on the lights when you go into a dark area. Replace any light bulbs as soon as they burn out. Set up your furniture so you have a clear path. Avoid moving your furniture around. If any of your floors are uneven, fix them. If there are any pets around you, be aware of where they are. Review your medicines with your doctor. Some medicines can make you feel dizzy. This can increase your chance of falling. Ask your doctor what other things that you can  do to  help prevent falls. This information is not intended to replace advice given to you by your health care provider. Make sure you discuss any questions you have with your health care provider. Document Released: 03/15/2009 Document Revised: 10/25/2015 Document Reviewed: 06/23/2014 Elsevier Interactive Patient Education  2017 [employer].

## 2022-09-25 LAB — PSA: Prostate Specific Ag, Serum: <0.1 ng/mL (ref 0.0–4.0)

## 2022-09-25 LAB — CMP14 + ANION GAP
ALT: 32 IU/L (ref 0–44)
AST: 26 IU/L (ref 0–40)
Albumin/Globulin Ratio: 1.3 (ref 1.2–2.2)
Albumin: 4.3 g/dL (ref 3.8–4.8)
Alkaline Phosphatase: 162 IU/L — ABNORMAL HIGH (ref 44–121)
Anion Gap: 18 mmol/L (ref 10.0–18.0)
BUN/Creatinine Ratio: 15 (ref 10–24)
BUN: 12 mg/dL (ref 8–27)
Bilirubin Total: 0.2 mg/dL (ref 0.0–1.2)
CO2: 19 mmol/L — ABNORMAL LOW (ref 20–29)
Calcium: 9.8 mg/dL (ref 8.6–10.2)
Chloride: 105 mmol/L (ref 96–106)
Creatinine, Ser: 0.81 mg/dL (ref 0.76–1.27)
Globulin, Total: 3.2 g/dL (ref 1.5–4.5)
Glucose: 97 mg/dL (ref 70–99)
Potassium: 3.9 mmol/L (ref 3.5–5.2)
Sodium: 142 mmol/L (ref 134–144)
Total Protein: 7.5 g/dL (ref 6.0–8.5)
eGFR: 94 mL/min/{1.73_m2} (ref >59)

## 2022-09-25 LAB — LIPID PANEL
Chol/HDL Ratio: 2.1 ratio (ref 0.0–5.0)
Cholesterol, Total: 128 mg/dL (ref 100–199)
HDL: 61 mg/dL (ref >39)
LDL Chol Calc (NIH): 51 mg/dL (ref 0–99)
Triglycerides: 81 mg/dL (ref 0–149)
VLDL Cholesterol Cal: 16 mg/dL (ref 5–40)

## 2022-09-25 LAB — HEMOGLOBIN A1C
Est. average glucose Bld gHb Est-mCnc: 140 mg/dL
Hgb A1c MFr Bld: 6.5 % — ABNORMAL HIGH (ref 4.8–5.6)

## 2022-09-25 LAB — TSH: TSH: 1.86 u[IU]/mL (ref 0.450–4.500)

## 2022-10-05 ENCOUNTER — Other Ambulatory Visit: Payer: Self-pay | Admitting: Nurse Practitioner

## 2022-10-08 ENCOUNTER — Other Ambulatory Visit: Payer: Self-pay

## 2022-10-14 ENCOUNTER — Telehealth: Payer: Self-pay

## 2022-10-14 NOTE — Telephone Encounter (Signed)
I left pt a vm letting him know his ozempic has been shipped to the office. YL,RMA

## 2022-11-11 ENCOUNTER — Telehealth: Payer: Self-pay

## 2022-11-11 NOTE — Telephone Encounter (Signed)
Patient notified that his ozempic has arrived here at the office and he can come pick it up. YL,RMA

## 2022-11-25 ENCOUNTER — Other Ambulatory Visit: Payer: Self-pay | Admitting: Cardiology

## 2022-11-28 ENCOUNTER — Encounter: Payer: Self-pay | Admitting: Cardiology

## 2022-11-28 ENCOUNTER — Ambulatory Visit: Payer: Medicare HMO | Attending: Cardiology | Admitting: Cardiology

## 2022-11-28 VITALS — BP 116/80 | HR 80 | Ht 67.0 in | Wt 155.2 lb

## 2022-11-28 DIAGNOSIS — I25119 Atherosclerotic heart disease of native coronary artery with unspecified angina pectoris: Secondary | ICD-10-CM

## 2022-11-28 DIAGNOSIS — I10 Essential (primary) hypertension: Secondary | ICD-10-CM

## 2022-11-28 DIAGNOSIS — E782 Mixed hyperlipidemia: Secondary | ICD-10-CM | POA: Diagnosis not present

## 2022-11-28 MED ORDER — ROSUVASTATIN CALCIUM 5 MG PO TABS: 5.0000 mg | ORAL_TABLET | ORAL | 6 refills | Status: DC

## 2022-11-28 NOTE — Progress Notes (Signed)
Clinical Summary Ronald Faulkner is a 72 y.o.male seen today for follow up of the following medical problems.   1.CAD -history of DES x 2  to LAD in 07/2017 - 10/2019 cath: patent stents LAD, high grade diffuse disease small ramus, normal LCX, OM 50%, RCA 30% distal. LVEDP 20 - 07/2020 echo: LVEF 60-65%, no WMAs, grade I dd, normal RV function.  - 03/2021 nuclear stress: no ischemia - headaches on imdur 90mg  though would be willing to retry.        - no chest pains, no SOB/DOE - compliant with meds     2.Hyperlipidemia - difficultly tolerating statin, on crestor 3 days a week and zetia  - he is on zetia - Jan 2023 TC 167 TG 102 HDL 58 LDL 91. Appears was not taking zetia at that time.    12/2021 TC 119 TG 103 HDL 49 LDL 51 09/2022 TC 128 TG 81 HDL 61 LDL 51 - nonspecific muscle aches, unclear if related to crestor   3. HTN - compliant with meds   4. SOB - ongoing 3-4 months - no coughing, no wheezing - DOE walking 30 to 40 feet. Some associated chest pain. Squeezing like feeling left sided, can be mild to moderate. Pain lasts a few minutes. Symptoms improve with rest. - increasing in frequency.  - occasional LE edema   -ongoing SOB unchanged   12/2021 abnormal PFTs, referred to pulmonary -breathing has improved.        2020 CT no aneurysm      SH: big [employer] fan Past Medical History:  Diagnosis Date   Arthritis    CAD (coronary artery disease)    a. 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 Orvan Falconer on 05/08/2021 -Pigmented   Unstable angina (HCC)  07/2017     Allergies  Allergen Reactions   Bee Venom    Fish Allergy Hives and Swelling   Ivp Dye [Iodinated Contrast Media] Swelling     Current Outpatient Medications  Medication Sig Dispense Refill   allopurinol (ZYLOPRIM) 300 MG tablet Take 1 tablet by mouth once daily 90 tablet 0   amLODipine (NORVASC) 10 MG tablet Take 1 tablet by mouth once daily 90 tablet 3   aspirin EC 81 MG tablet Take 81 mg by mouth daily.     ezetimibe (ZETIA) 10 MG tablet Take 1 tablet by mouth once daily 90 tablet 1   famotidine (PEPCID) 20 MG tablet Take 20 mg by mouth daily.     fluticasone (FLONASE) 50 MCG/ACT nasal spray Place 2 sprays into both nostrils daily.     glucose blood test strip 1 each by Other route as needed for other. Use as instructed 100 each 6   Insulin Pen Needle 32G X 6 MM MISC Use with ozempic 50 each 3   isosorbide mononitrate (IMDUR) 60 MG 24 hr tablet TAKE 1 & 1/2 (ONE & ONE-HALF) TABLETS BY MOUTH ONCE DAILY 135 tablet 3   linaclotide (LINZESS) 72 MCG  capsule Take 72 mcg by mouth every other day.     metFORMIN (GLUCOPHAGE) 500 MG tablet Take 1 tablet (500 mg total) by mouth 2 (two) times daily with a meal. 180 tablet 3   metoprolol succinate (TOPROL-XL) 50 MG 24 hr tablet Take 1 tablet by mouth once daily 90 tablet 1   nitroGLYCERIN (NITROSTAT) 0.4 MG SL tablet DISSOLVE ONE TABLET UNDER THE TONGUE EVERY 5 MINUTES AS NEEDED FOR CHEST PAIN.&nbsp;&nbsp;DO NOT EXCEED A TOTAL OF 3 DOSES IN 15 MINUTES 25 tablet 0   oxymetazoline (AFRIN) 0.05 % nasal spray Place 1 spray into both nostrils 2 (two) times daily as needed for congestion.     pantoprazole (PROTONIX) 40 MG tablet Take 1 tablet (40 mg total) by mouth daily. Take 30-60 min before first meal of the day 30 tablet 2   rosuvastatin (CRESTOR) 10 MG tablet TAKE 1 TABLET BY MOUTH ON MONDAY, WEDNESDAY AND FRIDAY 36 tablet 0   Semaglutide, 1 MG/DOSE, (OZEMPIC, 1 MG/DOSE,) 4 MG/3ML SOPN Inject 1 mg into the skin once a week. 9 mL 1    triamcinolone ointment (KENALOG) 0.5 % Apply 1 Application topically 2 (two) times daily. 30 g 0   valsartan (DIOVAN) 160 MG tablet Take 1 tablet (160 mg total) by mouth daily. 30 tablet 11   No current facility-administered medications for this visit.     Past Surgical History:  Procedure Laterality Date   ABDOMINAL SURGERY     APPENDECTOMY     BREAST SURGERY     benign lump at lt side   COLON SURGERY     COLONOSCOPY  03/14/2011   Procedure: COLONOSCOPY;  Surgeon: Malissa Hippo, MD;  [facility]: [facility];  Service: Endoscopy;  Laterality: N/A;  1:00   COLONOSCOPY N/A 11/15/2015   Procedure: COLONOSCOPY;  Surgeon: Malissa Hippo, MD;  Location: [facility];  Service: Endoscopy;  Laterality: N/A;  11:15   COLONOSCOPY WITH PROPOFOL N/A 07/15/2019   Procedure: COLONOSCOPY WITH PROPOFOL;  Surgeon: Malissa Hippo, MD;  Location: [facility];  Service: Endoscopy;  Laterality: N/A;  1235   CORONARY STENT INTERVENTION N/A 07/31/2017   Procedure: CORONARY STENT INTERVENTION;  Surgeon: Kathleene Hazel, MD;  Location: [facility];  Service: Cardiovascular;  Laterality: N/A;   LEFT HEART CATH AND CORONARY ANGIOGRAPHY N/A 07/31/2017   Procedure: LEFT HEART CATH AND CORONARY ANGIOGRAPHY;  Surgeon: Kathleene Hazel, MD;  Location: [facility];  Service: Cardiovascular;  Laterality: N/A;   LEFT HEART CATH AND CORONARY ANGIOGRAPHY N/A 10/28/2019   Procedure: LEFT HEART CATH AND CORONARY ANGIOGRAPHY;  Surgeon: Lyn Records, MD;  Location: [facility];  Service: Cardiovascular;  Laterality: N/A;   PENILE PROSTHESIS IMPLANT     2016    prostate cancer     2015. prostectomy     Allergies  Allergen Reactions   Bee Venom    Fish Allergy Hives and Swelling   Ivp Dye [Iodinated Contrast Media] Swelling      Family History  Problem Relation Age of Onset   Colon cancer Mother    Coronary artery disease Maternal Grandmother    Anesthesia problems Neg Hx     Hypotension Neg Hx    Malignant hyperthermia Neg Hx    Pseudochol deficiency Neg Hx      Social History Mr. Surface reports that he quit smoking about 23 years ago. His smoking use included cigarettes. He has a 35.00 pack-year smoking history.  He has never used smokeless tobacco. Mr. Sibole reports current alcohol use.   Review of Systems CONSTITUTIONAL: No weight loss, fever, chills, weakness or fatigue.  HEENT: Eyes: No visual loss, blurred vision, double vision or yellow sclerae.No hearing loss, sneezing, congestion, runny nose or sore throat.  SKIN: No rash or itching.  CARDIOVASCULAR: per hpi RESPIRATORY: No shortness of breath, cough or sputum.  GASTROINTESTINAL: No anorexia, nausea, vomiting or diarrhea. No abdominal pain or blood.  GENITOURINARY: No burning on urination, no polyuria NEUROLOGICAL: No headache, dizziness, syncope, paralysis, ataxia, numbness or tingling in the extremities. No change in bowel or bladder control.  MUSCULOSKELETAL: No muscle, back pain, joint pain or stiffness.  LYMPHATICS: No enlarged nodes. No history of splenectomy.  PSYCHIATRIC: No history of depression or anxiety.  ENDOCRINOLOGIC: No reports of sweating, cold or heat intolerance. No polyuria or polydipsia.  Marland Kitchen   Physical Examination Today's Vitals   11/28/22 0923  BP: 116/80  Pulse: 80  SpO2: 98%  Weight: 155 lb 3.2 oz (70.4 kg)  Height: 5\' 7"  (1.702 m)   Body mass index is 24.31 kg/m.  Gen: resting comfortably, no acute distress HEENT: no scleral icterus, pupils equal round and reactive, no palptable cervical adenopathy,  CV: RRR, no m/rg, no jvd Resp: Clear to auscultation bilaterally GI: abdomen is soft, non-tender, non-distended, normal bowel sounds, no hepatosplenomegaly MSK: extremities are warm, no edema.  Skin: warm, no rash Neuro:  no focal deficits Psych: appropriate affect   Diagnostic Studies  07/2017 cath Prox RCA to Mid RCA lesion is 20% stenosed. Dist RCA  lesion is 50% stenosed. Dist Cx lesion is 60% stenosed. Ost Cx to Prox Cx lesion is 20% stenosed. Ost 1st Mrg to 1st Mrg lesion is 70% stenosed. Ost LAD to Prox LAD lesion is 20% stenosed. Mid LAD-1 lesion is 80% stenosed. Mid LAD-2 lesion is 95% stenosed. Dist LAD lesion is 70% stenosed. A drug-eluting stent was successfully placed using a STENT SIERRA 2.25 X 12 MM. Post intervention, there is a 0% residual stenosis. A drug-eluting stent was successfully placed using a STENT SIERRA 2.50 X 18 MM. Post intervention, there is a 0% residual stenosis.   1. Severe stenosis in the mid and distal LAD. Successful PTCA/DES x 1 distal LAD and PTCA/DES x 1 mid LAD.  2. Modeately severe stenosis in small caliber intermediate Floyde Dingley.  3. Moderate stenosis in the distal RCA   Post cath Recommendations: Will continue DAPT with ASA and Plavix for at least one year. Will continue beta blocker and statin. I would recommend medical management of the disease in the small caliber intermediate Rochella Benner.    10/2019 cath Patent left main Patent LAD mid and distal stent. High-grade diffuse obstructive disease in a branching relatively small ramus intermedius.  Slight progression compared to 2019. Widely patent circumflex with first obtuse marginal containing a 50% proximal narrowing Widely patent dominant right coronary with 30 to 40% narrowing distally. Normal LV function with EF 55%.  LVEDP 20 mmHg.   RECOMMENDATIONS:   Continue current therapy Further medication adjustments and management per Dr.,Konesweran     07/2020 echo 1. Left ventricular ejection fraction, by estimation, is 60 to 65%. The  left ventricle has normal function. The left ventricle has no regional  wall motion abnormalities. Left ventricular diastolic parameters are  consistent with Grade I diastolic  dysfunction (impaired relaxation).   2. Right ventricular systolic function is normal. The right ventricular  size is normal. Tricuspid  regurgitation signal is inadequate  for assessing  PA pressure.   3. The mitral valve is grossly normal. Trivial mitral valve  regurgitation.   4. The aortic valve is tricuspid. There is mild calcification of the  aortic valve. Aortic valve regurgitation is not visualized.   5. The inferior vena cava is normal in size with greater than 50%  respiratory variability, suggesting right atrial pressure of 3 mmHg.   Comparison(s): Echocardiogram done 10/13/18 showed an EF of 60-65%     03/2021 nuclear stress:  The study is normal. There are no perfusion defects consistent with prior infarct or current ischemia. The study is low risk.   No ST deviation was noted.   There is a moderate size moderate intensity inferior defect that is most intense in the resting images with normal wall motion. Findings consistent with diaphragmatic attenuation and artifact due to adjacet gut tracer uptake.   Left ventricular function is normal. Nuclear stress EF: 67 %. The left ventricular ejection fraction is hyperdynamic (>65%). End diastolic cavity size is normal.   10/2021 PFTs: minimal obstruction, moderate diffusion defect, +airtrapping         Assessment and Plan  1.CAD with unspecified angina - 2021 cath with stable disease including residual ramus disease, 03/2021 nuclear stress no ischemia - no symptoms, continue current meds   2. Hyperlipidemia - LDL at goal - nonspecific muscle aches unclear if statin related. Try lowering crestor to 5mg  on MWF.   3. HTN - he is at goal, continue current meds      Ronald Faulkner, M.D.

## 2022-11-28 NOTE — Addendum Note (Signed)
Addended by: Lesle Chris on: 11/28/2022 09:57 AM   Modules accepted: Orders

## 2022-11-28 NOTE — Patient Instructions (Signed)
Medication Instructions:   Decrease Crestor to 5mg  on Monday, Wednesday, & Friday Please call the office with update on muscle aches in 3 weeks Continue all other medications.     Labwork:  none  Testing/Procedures:  none  Follow-Up:  6 months   Any Other Special Instructions Will Be Listed Below (If Applicable).   If you need a refill on your cardiac medications before your next appointment, please call your pharmacy.

## 2022-12-17 ENCOUNTER — Telehealth: Payer: Self-pay | Admitting: Nurse Practitioner

## 2022-12-17 NOTE — Telephone Encounter (Signed)
Pt picked up patient assistance 11/19/22 Ozempic 1x19ml Quantity 4

## 2023-01-03 ENCOUNTER — Other Ambulatory Visit: Payer: Self-pay | Admitting: Nurse Practitioner

## 2023-01-08 ENCOUNTER — Encounter: Payer: Medicare HMO | Admitting: Nurse Practitioner

## 2023-01-16 ENCOUNTER — Encounter: Payer: Self-pay | Admitting: Nurse Practitioner

## 2023-01-16 ENCOUNTER — Ambulatory Visit (INDEPENDENT_AMBULATORY_CARE_PROVIDER_SITE_OTHER): Payer: Medicare HMO | Admitting: Nurse Practitioner

## 2023-01-16 VITALS — BP 100/74 | HR 85 | Temp 97.8°F | Ht 67.0 in | Wt 151.2 lb

## 2023-01-16 DIAGNOSIS — Z79899 Other long term (current) drug therapy: Secondary | ICD-10-CM

## 2023-01-16 DIAGNOSIS — C61 Malignant neoplasm of prostate: Secondary | ICD-10-CM

## 2023-01-16 DIAGNOSIS — Z9079 Acquired absence of other genital organ(s): Secondary | ICD-10-CM | POA: Insufficient documentation

## 2023-01-16 DIAGNOSIS — Z0000 Encounter for general adult medical examination without abnormal findings: Secondary | ICD-10-CM | POA: Diagnosis not present

## 2023-01-16 DIAGNOSIS — I25119 Atherosclerotic heart disease of native coronary artery with unspecified angina pectoris: Secondary | ICD-10-CM | POA: Diagnosis not present

## 2023-01-16 DIAGNOSIS — I119 Hypertensive heart disease without heart failure: Secondary | ICD-10-CM

## 2023-01-16 DIAGNOSIS — M722 Plantar fascial fibromatosis: Secondary | ICD-10-CM | POA: Insufficient documentation

## 2023-01-16 DIAGNOSIS — E1159 Type 2 diabetes mellitus with other circulatory complications: Secondary | ICD-10-CM | POA: Diagnosis not present

## 2023-01-16 LAB — POCT URINALYSIS DIP (CLINITEK)
Blood, UA: NEGATIVE
Glucose, UA: NEGATIVE mg/dL
Ketones, POC UA: NEGATIVE mg/dL
Leukocytes, UA: NEGATIVE
Nitrite, UA: NEGATIVE
POC PROTEIN,UA: 30 — AB
Spec Grav, UA: >=1.03 — AB (ref 1.010–1.025)
Urobilinogen, UA: 0.2 E.U./dL
pH, UA: 5.5 (ref 5.0–8.0)

## 2023-01-16 NOTE — Progress Notes (Signed)
I,Ronald Faulkner,acting as a Neurosurgeon for Ronald Felts, FNP.,have documented all relevant documentation on the behalf of Ronald Felts, FNP,as directed by  Ronald Felts, FNP while in the presence of Ronald Felts, FNP.    Subjective:     Patient ID: Ronald Faulkner , male    DOB: 03/28/1951 , 72 y.o.   MRN: [id number]   Chief Complaint  Patient presents with   Annual Exam    HPI  Patient presents today for HM, patient reports complaince with medications. Patient denies any chest pain, SOB, or headaches. Patient has no concerns today. He is getting Ozempic with patient assistance. He had plantar fascitis and was in a boot. He reports his diet is "pretty good" not exercising regularly.   BP Readings from Last 3 Encounters: 01/16/23 : 100/74 11/28/22 : 116/80 09/24/22 : 110/62    Diabetes He presents for his follow-up diabetic visit. He has type 2 diabetes mellitus. No MedicAlert identification noted. His disease course has been improving. There are no hypoglycemic associated symptoms. Pertinent negatives for hypoglycemia include no dizziness or headaches. There are no diabetic associated symptoms. There are no hypoglycemic complications. Symptoms are stable. There are no diabetic complications. Risk factors for coronary artery disease include diabetes mellitus, male sex, sedentary lifestyle and hypertension. Current diabetic treatment includes oral agent (dual therapy) (Ozempic and metformin). He is following a diabetic diet. When asked about meal planning, he reported none. He has not had a previous visit with a dietitian. He rarely participates in exercise. (Blood sugar ranging 92- 126) An ACE inhibitor/angiotensin II receptor blocker is being taken. Eye exam current: 08/22/2020.  Hypertension This is a chronic problem. The current episode started 1 to 4 weeks ago. The problem has been gradually worsening since onset. The problem is uncontrolled. Pertinent negatives include no headaches,  peripheral edema or shortness of breath. There are no associated agents to hypertension. Risk factors for coronary artery disease include sedentary lifestyle. Past treatments include angiotensin blockers, calcium channel blockers and direct vasodilators. The current treatment provides moderate improvement. There are no compliance problems.  Hypertensive end-organ damage includes CAD/MI. There is no history of angina. There is no history of chronic renal disease or sleep apnea.     Past Medical History:  Diagnosis Date   Abnormal EKG 12/08/2011   Arthritis    Atypical chest pain 12/08/2011   CAD (coronary artery disease)    a. 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 Orvan Falconer on 05/08/2021 -Pigmented   Unstable angina (HCC) 07/2017     Family History  Problem Relation Age of Onset   Colon cancer Mother    Coronary artery disease Maternal Grandmother    Anesthesia problems Neg Hx    Hypotension Neg Hx    Malignant hyperthermia Neg Hx    Pseudochol deficiency Neg Hx  Current Outpatient Medications:    allopurinol (ZYLOPRIM) 300 MG tablet, Take 1 tablet by mouth once daily, Disp: 90 tablet, Rfl: 0   amLODipine (NORVASC) 10 MG tablet, Take 1 tablet by mouth once daily, Disp: 90 tablet, Rfl: 3   aspirin EC 81 MG tablet, Take 81 mg by mouth daily., Disp: , Rfl:    ezetimibe (ZETIA) 10 MG tablet, Take 1 tablet  by mouth once daily, Disp: 90 tablet, Rfl: 1   famotidine (PEPCID) 20 MG tablet, Take 20 mg by mouth daily., Disp: , Rfl:    fluticasone (FLONASE) 50 MCG/ACT nasal spray, Place 2 sprays into both nostrils daily., Disp: , Rfl:    gabapentin (NEURONTIN) 100 MG capsule, Take 100 mg by mouth at bedtime., Disp: , Rfl:    glucose blood test strip, 1 each by Other route as needed for other. Use as instructed, Disp: 100 each, Rfl: 6   Insulin Pen Needle 32G X 6 MM MISC, Use with ozempic, Disp: 50 each, Rfl: 3   isosorbide mononitrate (IMDUR) 60 MG 24 hr tablet, TAKE 1 & 1/2 (ONE & ONE-HALF) TABLETS BY MOUTH ONCE DAILY, Disp: 135 tablet, Rfl: 3   linaclotide (LINZESS) 72 MCG capsule, Take 72 mcg by mouth every other day., Disp: , Rfl:    metFORMIN (GLUCOPHAGE) 500 MG tablet, TAKE 1 TABLET BY MOUTH TWICE DAILY WITH A MEAL, Disp: 180 tablet, Rfl: 0   metoprolol succinate (TOPROL-XL) 50 MG 24 hr tablet, Take 1 tablet by mouth once daily, Disp: 90 tablet, Rfl: 1   nitroGLYCERIN (NITROSTAT) 0.4 MG SL tablet, DISSOLVE ONE TABLET UNDER THE TONGUE EVERY 5 MINUTES AS NEEDED FOR CHEST PAIN.&nbsp;&nbsp;DO NOT EXCEED A TOTAL OF 3 DOSES IN 15 MINUTES, Disp: 25 tablet, Rfl: 0   oxymetazoline (AFRIN) 0.05 % nasal spray, Place 1 spray into both nostrils 2 (two) times daily as needed for congestion., Disp: , Rfl:    pantoprazole (PROTONIX) 40 MG tablet, Take 1 tablet (40 mg total) by mouth daily. Take 30-60 min before first meal of the day, Disp: 30 tablet, Rfl: 2   rosuvastatin (CRESTOR) 5 MG tablet, Take 1 tablet (5 mg total) by mouth as directed. (Monday, Wednesday, & Friday), Disp: 12 tablet, Rfl: 6   Semaglutide, 1 MG/DOSE, (OZEMPIC, 1 MG/DOSE,) 4 MG/3ML SOPN, Inject 1 mg into the skin once a week., Disp: 9 mL, Rfl: 1   triamcinolone ointment (KENALOG) 0.5 %, Apply 1 Application topically 2 (two) times daily., Disp: 30 g, Rfl: 0   valsartan (DIOVAN) 160 MG tablet, Take 1 tablet (160 mg total) by mouth daily., Disp: 30  tablet, Rfl: 11   Allergies  Allergen Reactions   Bee Venom    Fish Allergy Hives and Swelling   Ivp Dye [Iodinated Contrast Media] Swelling     Review of Systems  Constitutional: Negative.   HENT: Negative.    Eyes: Negative.   Respiratory: Negative.  Negative for shortness of breath.   Cardiovascular: Negative.   Gastrointestinal: Negative.   Endocrine: Negative.   Genitourinary: Negative.   Musculoskeletal: Negative.   Skin: Negative.   Allergic/Immunologic: Negative.   Neurological: Negative.  Negative for dizziness and headaches.  Hematological: Negative.   Psychiatric/Behavioral: Negative.       Today's Vitals   01/16/23 1026  BP: 100/74  Pulse: 85  Temp: 97.8 F (36.6 C)  TempSrc: Oral  Weight: 151 lb 3.2 oz (68.6 kg)  Height: 5\' 7"  (1.702 m)  PainSc: 0-No pain   Body mass index  is 23.68 kg/m.   Objective:  Physical Exam Vitals reviewed.  Constitutional:      General: He is not in acute distress.    Appearance: Normal appearance.  HENT:     Head: Normocephalic and atraumatic.     Right Ear: Tympanic membrane, ear canal and external ear normal. There is no impacted cerumen.     Left Ear: Tympanic membrane, ear canal and external ear normal. There is no impacted cerumen.     Nose: Nose normal.     Mouth/Throat:     Mouth: Mucous membranes are moist.  Eyes:     Pupils: Pupils are equal, round, and reactive to light.  Cardiovascular:     Rate and Rhythm: Normal rate and regular rhythm.     Pulses: Normal pulses.     Heart sounds: Normal heart sounds. No murmur heard. Pulmonary:     Effort: Pulmonary effort is normal. No respiratory distress.     Breath sounds: Normal breath sounds. No wheezing.  Abdominal:     General: Abdomen is flat. Bowel sounds are normal. There is no distension.     Palpations: Abdomen is soft.  Genitourinary:    Comments: Prostatectomy Musculoskeletal:        General: Normal range of motion.     Cervical back: Normal range  of motion and neck supple.  Skin:    General: Skin is warm and dry.     Capillary Refill: Capillary refill takes less than 2 seconds.     Coloration: Skin is not jaundiced.     Findings: No rash.  Neurological:     General: No focal deficit present.     Mental Status: He is alert and oriented to person, place, and time.     Cranial Nerves: No cranial nerve deficit.     Motor: No weakness.  Psychiatric:        Mood and Affect: Mood normal.        Behavior: Behavior normal.        Thought Content: Thought content normal.        Judgment: Judgment normal.         Assessment And Plan:     Encounter for annual health examination Assessment & Plan: Behavior modifications discussed and diet history reviewed.   Pt will continue to exercise regularly and modify diet with low GI, plant based foods and decrease intake of processed foods.  Recommend intake of daily multivitamin, Vitamin D, and calcium.  Recommend colonoscopy for preventive screenings, as well as recommend immunizations that include influenza, TDAP, and Shingles    Type 2 diabetes mellitus with other circulatory complication, without long-term current use of insulin (HCC) -     POCT URINALYSIS DIP (CLIN[facility]) -     Microalbumin / creatinine urine ratio -     CMP14+EGFR -     Hemoglobin A1c  Hypertensive heart disease without heart failure Assessment & Plan: Blood pressure is well controlled, continue current medications. Will check eGFR   Coronary artery disease involving native coronary artery of native heart with angina pectoris [facility]) Assessment & Plan: Continue f/u with Cardiology  Orders: -     Lipid panel  H/O prostatectomy  Malignant neoplasm of prostate [facility]) Assessment & Plan: Continue f/u with urology   Plantar fasciitis of left foot Assessment & Plan: Continue f/u with Podiatry, I have encouraged him to wear better support tennis shoes and to consider diabetes shoes   Other long term (current)  drug therapy -  CBC with Differential/Platelet      Patient was given opportunity to ask questions. Patient verbalized understanding of the plan and was able to repeat key elements of the plan. All questions were answered to their satisfaction.  Ronald Felts, FNP   I, Ronald Felts, FNP, have reviewed all documentation for this visit. The documentation on 01/16/23 for the exam, diagnosis, procedures, and orders are all accurate and complete.   IF YOU HAVE BEEN REFERRED TO A SPECIALIST, IT MAY TAKE 1-2 WEEKS TO SCHEDULE/PROCESS THE REFERRAL. IF YOU HAVE NOT HEARD FROM US/SPECIALIST IN TWO WEEKS, PLEASE GIVE [place] A CALL AT [phone] X [phone].   THE PATIENT IS ENCOURAGED TO PRACTICE SOCIAL DISTANCING DUE TO THE COVID-19 PANDEMIC.

## 2023-01-16 NOTE — Assessment & Plan Note (Signed)
 Blood pressure is well controlled, continue current medications. Will check eGFR

## 2023-01-16 NOTE — Assessment & Plan Note (Signed)
Behavior modifications discussed and diet history reviewed.   Pt will continue to exercise regularly and modify diet with low GI, plant based foods and decrease intake of processed foods.  Recommend intake of daily multivitamin, Vitamin D, and calcium.  Recommend colonoscopy for preventive screenings, as well as recommend immunizations that include influenza, TDAP, and Shingles  

## 2023-01-16 NOTE — Assessment & Plan Note (Signed)
Continue f/u with urology  

## 2023-01-16 NOTE — Assessment & Plan Note (Signed)
Continue f/u with Podiatry, I have encouraged him to wear better support tennis shoes and to consider diabetes shoes

## 2023-01-16 NOTE — Assessment & Plan Note (Signed)
Continue f/u with Cardiology.

## 2023-01-17 LAB — CMP14+EGFR
ALT: 15 IU/L (ref 0–44)
AST: 16 IU/L (ref 0–40)
Albumin: 4.3 g/dL (ref 3.8–4.8)
Alkaline Phosphatase: 120 IU/L (ref 44–121)
BUN/Creatinine Ratio: 13 (ref 10–24)
BUN: 11 mg/dL (ref 8–27)
Bilirubin Total: 0.3 mg/dL (ref 0.0–1.2)
CO2: 21 mmol/L (ref 20–29)
Calcium: 9.6 mg/dL (ref 8.6–10.2)
Chloride: 105 mmol/L (ref 96–106)
Creatinine, Ser: 0.85 mg/dL (ref 0.76–1.27)
Globulin, Total: 2.7 g/dL (ref 1.5–4.5)
Glucose: 95 mg/dL (ref 70–99)
Potassium: 4.3 mmol/L (ref 3.5–5.2)
Sodium: 141 mmol/L (ref 134–144)
Total Protein: 7 g/dL (ref 6.0–8.5)
eGFR: 92 mL/min/{1.73_m2} (ref >59)

## 2023-01-17 LAB — CBC WITH DIFFERENTIAL/PLATELET
Basophils Absolute: 0 10*3/uL (ref 0.0–0.2)
Basos: 1 %
EOS (ABSOLUTE): 0.1 10*3/uL (ref 0.0–0.4)
Eos: 1 %
Hematocrit: 41.6 % (ref 37.5–51.0)
Hemoglobin: 14 g/dL (ref 13.0–17.7)
Immature Grans (Abs): 0 10*3/uL (ref 0.0–0.1)
Immature Granulocytes: 0 %
Lymphocytes Absolute: 1.7 10*3/uL (ref 0.7–3.1)
Lymphs: 29 %
MCH: 28.2 pg (ref 26.6–33.0)
MCHC: 33.7 g/dL (ref 31.5–35.7)
MCV: 84 fL (ref 79–97)
Monocytes Absolute: 0.4 10*3/uL (ref 0.1–0.9)
Monocytes: 8 %
Neutrophils Absolute: 3.6 10*3/uL (ref 1.4–7.0)
Neutrophils: 61 %
Platelets: 189 10*3/uL (ref 150–450)
RBC: 4.96 x10E6/uL (ref 4.14–5.80)
RDW: 14.6 % (ref 11.6–15.4)
WBC: 5.9 10*3/uL (ref 3.4–10.8)

## 2023-01-17 LAB — HEMOGLOBIN A1C
Est. average glucose Bld gHb Est-mCnc: 137 mg/dL
Hgb A1c MFr Bld: 6.4 % — ABNORMAL HIGH (ref 4.8–5.6)

## 2023-01-17 LAB — MICROALBUMIN / CREATININE URINE RATIO
Creatinine, Urine: 292.9 mg/dL
Microalb/Creat Ratio: 65 mg/g{creat} — ABNORMAL HIGH (ref 0–29)
Microalbumin, Urine: 189.7 ug/mL

## 2023-01-17 LAB — LIPID PANEL
Chol/HDL Ratio: 2.3 ratio (ref 0.0–5.0)
Cholesterol, Total: 137 mg/dL (ref 100–199)
HDL: 59 mg/dL (ref >39)
LDL Chol Calc (NIH): 61 mg/dL (ref 0–99)
Triglycerides: 90 mg/dL (ref 0–149)
VLDL Cholesterol Cal: 17 mg/dL (ref 5–40)

## 2023-01-20 ENCOUNTER — Telehealth: Payer: Self-pay

## 2023-01-20 NOTE — Telephone Encounter (Signed)
Left patient a voicemail. Patient assistance ready for pickup.

## 2023-01-21 ENCOUNTER — Other Ambulatory Visit: Payer: Self-pay

## 2023-01-21 DIAGNOSIS — E1159 Type 2 diabetes mellitus with other circulatory complications: Secondary | ICD-10-CM

## 2023-01-21 DIAGNOSIS — N289 Disorder of kidney and ureter, unspecified: Secondary | ICD-10-CM

## 2023-01-21 NOTE — Progress Notes (Signed)
Please place referral for Nephrology - dx abnormal microalbumin and diabetes code

## 2023-01-28 ENCOUNTER — Other Ambulatory Visit: Payer: Self-pay | Admitting: Nurse Practitioner

## 2023-02-02 ENCOUNTER — Other Ambulatory Visit: Payer: Self-pay | Admitting: Internal Medicine

## 2023-02-02 ENCOUNTER — Other Ambulatory Visit: Payer: Self-pay | Admitting: Cardiology

## 2023-02-04 ENCOUNTER — Other Ambulatory Visit: Payer: Self-pay

## 2023-02-04 MED ORDER — VALSARTAN 160 MG PO TABS: 160.0000 mg | ORAL_TABLET | Freq: Every day | ORAL | 3 refills | Status: DC

## 2023-04-02 ENCOUNTER — Other Ambulatory Visit: Payer: Self-pay | Admitting: Internal Medicine

## 2023-04-02 DIAGNOSIS — R808 Other proteinuria: Secondary | ICD-10-CM

## 2023-04-02 DIAGNOSIS — E559 Vitamin D deficiency, unspecified: Secondary | ICD-10-CM

## 2023-04-07 ENCOUNTER — Other Ambulatory Visit: Payer: Self-pay | Admitting: Nurse Practitioner

## 2023-04-09 LAB — LAB REPORT - SCANNED: Creatinine, POC: 103 mg/dL

## 2023-04-16 ENCOUNTER — Ambulatory Visit
Admission: RE | Admit: 2023-04-16 | Discharge: 2023-04-16 | Disposition: A | Discharge status: Home / Self Care | Payer: Medicare HMO | Source: Ambulatory Visit | Attending: Internal Medicine | Admitting: Internal Medicine

## 2023-04-16 DIAGNOSIS — E559 Vitamin D deficiency, unspecified: Secondary | ICD-10-CM

## 2023-04-16 DIAGNOSIS — R808 Other proteinuria: Secondary | ICD-10-CM

## 2023-05-11 ENCOUNTER — Other Ambulatory Visit: Payer: Self-pay | Admitting: Nurse Practitioner

## 2023-05-11 ENCOUNTER — Other Ambulatory Visit: Payer: Self-pay | Admitting: Cardiology

## 2023-06-04 ENCOUNTER — Ambulatory Visit (INDEPENDENT_AMBULATORY_CARE_PROVIDER_SITE_OTHER): Payer: Medicare HMO | Admitting: Family Medicine

## 2023-06-04 ENCOUNTER — Encounter: Payer: Self-pay | Admitting: Family Medicine

## 2023-06-04 VITALS — BP 120/76 | HR 86 | Temp 98.1°F | Ht 67.0 in | Wt 156.0 lb

## 2023-06-04 DIAGNOSIS — I119 Hypertensive heart disease without heart failure: Secondary | ICD-10-CM

## 2023-06-04 DIAGNOSIS — M791 Myalgia, unspecified site: Secondary | ICD-10-CM

## 2023-06-04 DIAGNOSIS — M79606 Pain in leg, unspecified: Secondary | ICD-10-CM | POA: Diagnosis not present

## 2023-06-04 DIAGNOSIS — M545 Low back pain, unspecified: Secondary | ICD-10-CM | POA: Diagnosis not present

## 2023-06-04 DIAGNOSIS — E1159 Type 2 diabetes mellitus with other circulatory complications: Secondary | ICD-10-CM

## 2023-06-04 DIAGNOSIS — Z23 Encounter for immunization: Secondary | ICD-10-CM

## 2023-06-04 DIAGNOSIS — I25119 Atherosclerotic heart disease of native coronary artery with unspecified angina pectoris: Secondary | ICD-10-CM

## 2023-06-04 DIAGNOSIS — N289 Disorder of kidney and ureter, unspecified: Secondary | ICD-10-CM

## 2023-06-04 LAB — POCT URINALYSIS DIPSTICK
Bilirubin, UA: NEGATIVE
Blood, UA: NEGATIVE
Glucose, UA: NEGATIVE
Ketones, UA: NEGATIVE
Leukocytes, UA: NEGATIVE
Nitrite, UA: NEGATIVE
Protein, UA: POSITIVE — AB
Spec Grav, UA: 1.02 (ref 1.010–1.025)
Urobilinogen, UA: 0.2 U/dL
pH, UA: 5.5 (ref 5.0–8.0)

## 2023-06-04 MED ORDER — LIDOCAINE 5 % EX PTCH: 1.0000 | MEDICATED_PATCH | CUTANEOUS | 0 refills | Status: DC

## 2023-06-04 MED ORDER — DICLOFENAC SODIUM 1 % EX GEL: 2.0000 g | Freq: Four times a day (QID) | CUTANEOUS | 1 refills | Status: AC

## 2023-06-04 NOTE — Progress Notes (Signed)
 I,Jameka J Llittleton, CMA,acting as a neurosurgeon for [employer], NP.,have documented all relevant documentation on the behalf of Bruna Creighton, NP,as directed by  Bruna Creighton, NP while in the presence of Bruna Creighton, NP.  Subjective:  Patient ID: Ronald Faulkner , male    DOB: 21-Mar-1951 , 74 y.o.   MRN: [id number]  Chief Complaint  Patient presents with   Back Pain   Leg Pain    HPI  Patient is a 73 year old male who presents today for lower back pain that radiates to his bilateral thighs.He states that he has tried warm compresses which did not help much but he has not taken any medication for pain.  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 Patient denies any chest pain or shortness of breath at this time.  Patient states that he had renal ultrasound as ordered by his nephrologist for proteinuria on November 2024 but the results came back showing nothing acute.  Will take some labs today at the clinic and ordered back x-ray.  Patient voiced understanding and had no further questions at this time.      Past Medical History:  Diagnosis Date   Abnormal EKG 12/08/2011   Arthritis    Atypical chest pain 12/08/2011   CAD (coronary artery disease)    a. 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 Eda on 05/08/2021 -Pigmented   Unstable angina (HCC) 07/2017     Family History  Problem Relation Age of Onset   Colon cancer Mother    Coronary artery disease Maternal Grandmother    Anesthesia problems Neg Hx    Hypotension Neg Hx    Malignant hyperthermia Neg Hx    Pseudochol deficiency Neg Hx      Current Outpatient Medications:    allopurinol  (ZYLOPRIM ) 300 MG tablet, Take 1 tablet by mouth once daily, Disp: 90 tablet, Rfl: 0   amLODipine  (NORVASC ) 10 MG tablet, Take 1 tablet by mouth once daily, Disp: 90 tablet, Rfl: 2   aspirin  EC 81 MG tablet, Take 81 mg by mouth daily., Disp: , Rfl:    diclofenac  Sodium (VOLTAREN ) 1 % GEL, Apply 2 g topically 4 (four) times daily., Disp: 50 g, Rfl: 1   ezetimibe  (ZETIA ) 10 MG tablet, Take 1 tablet by mouth once daily, Disp: 90 tablet, Rfl: 1   famotidine  (PEPCID ) 20 MG tablet, Take 20 mg by mouth daily. (Patient not taking: Reported on  06/05/2023), Disp: , Rfl:    fluticasone  (FLONASE ) 50 MCG/ACT nasal spray, Place 2 sprays into both nostrils daily., Disp: , Rfl:    gabapentin (NEURONTIN) 100 MG capsule, Take 100 mg by mouth at bedtime. (Patient not taking: Reported on 06/05/2023), Disp: , Rfl:    glucose blood test strip, 1 each by Other route as needed for other. Use as instructed, Disp: 100 each, Rfl: 6   Insulin  Pen Needle 32G X 6 MM MISC, Use with ozempic , Disp: 50 each, Rfl: 3   isosorbide  mononitrate (IMDUR ) 60 MG 24 hr tablet, TAKE 1 & 1/2 (ONE & ONE-HALF) TABLETS BY MOUTH ONCE DAILY, Disp: 135 tablet, Rfl: 3   lidocaine  (LIDODERM ) 5 %, Place 1 patch onto the skin daily. Remove & Discard patch within 12 hours or as directed by MD, Disp: 30 patch, Rfl: 0   linaclotide  (LINZESS ) 72 MCG  capsule, Take 72 mcg by mouth daily as needed., Disp: , Rfl:    metFORMIN  (GLUCOPHAGE ) 500 MG tablet, TAKE 1 TABLET BY MOUTH TWICE DAILY WITH A MEAL, Disp: 180 tablet, Rfl: 0   metoprolol  succinate (TOPROL -XL) 50 MG 24 hr tablet, Take 1 tablet by mouth once daily, Disp: 90 tablet, Rfl: 1   nitroGLYCERIN  (NITROSTAT ) 0.4 MG SL tablet, DISSOLVE ONE TABLET UNDER THE TONGUE EVERY 5 MINUTES AS NEEDED FOR CHEST PAIN.&nbsp;&nbsp;DO NOT EXCEED A TOTAL OF 3 DOSES IN 15 MINUTES, Disp: 25 tablet, Rfl: 0   oxymetazoline  (AFRIN) 0.05 % nasal spray, Place 1 spray into both nostrils 2 (two) times daily as needed for congestion., Disp: , Rfl:    pantoprazole  (PROTONIX ) 40 MG tablet, Take 1 tablet (40 mg total) by mouth daily. Take 30-60 min before first meal of the day (Patient not taking: Reported on 06/05/2023), Disp: 30 tablet, Rfl: 2   rosuvastatin  (CRESTOR ) 5 MG tablet, Take 1 tablet (5 mg total) by mouth as directed. (Monday, Wednesday, & Friday), Disp: 12 tablet, Rfl: 6   Semaglutide , 1 MG/DOSE, (OZEMPIC , 1 MG/DOSE,) 4 MG/3ML SOPN, Inject 1 mg into the skin once a week., Disp: 9 mL, Rfl: 1   triamcinolone  ointment (KENALOG ) 0.5 %, Apply 1 Application topically 2 (two) times daily., Disp: 30 g, Rfl: 0   valsartan  (DIOVAN ) 160 MG tablet, Take 1 tablet (160 mg total) by mouth daily., Disp: 90 tablet, Rfl: 3   Allergies  Allergen Reactions   Bee Venom    Fish Allergy Hives and Swelling   Ivp Dye [Iodinated Contrast Media] Swelling     Review of Systems  Constitutional: Negative.   HENT: Negative.    Eyes: Negative.   Respiratory: Negative.    Cardiovascular:  Negative for chest pain, palpitations and leg swelling.  Genitourinary: Negative.   Musculoskeletal:  Positive for arthralgias and back pain.  Psychiatric/Behavioral: Negative.       Today's Vitals   06/04/23 1624  Weight: 156 lb (70.8 kg)  Height: 5' 7 (1.702 m)   Body mass index is 24.43 kg/m.  Wt Readings from Last 3 Encounters:   06/05/23 156 lb 9.6 oz (71 kg)  06/04/23 156 lb (70.8 kg)  01/16/23 151 lb 3.2 oz (68.6 kg)    The 10-year ASCVD risk score (Arnett DK, et al., 2019) is: 32.2%   Values used to calculate the score:     Age: 79 years     Sex: Male     Is Non-Hispanic African American: Yes     Diabetic: Yes     Tobacco smoker: No  Systolic Blood Pressure: 130 mmHg     Is BP treated: Yes     HDL Cholesterol: 59 mg/dL     Total Cholesterol: 137 mg/dL  Objective:  Physical Exam HENT:     Head: Normocephalic.  Cardiovascular:     Rate and Rhythm: Normal rate and regular rhythm.  Pulmonary:     Effort: Pulmonary effort is normal.     Breath sounds: Normal breath sounds.  Musculoskeletal:        General: Tenderness present.  Neurological:     Mental Status: He is alert and oriented to person, place, and time.  Psychiatric:        Mood and Affect: Mood normal.        Behavior: Behavior normal.         Assessment And Plan:  Lumbar pain with radiation down leg -     DG Lumbar Spine 2-3 Views; Future  Immunization due -     Flu Vaccine Trivalent High Dose (Fluad)  Type 2 diabetes mellitus with other circulatory complication, without long-term current use of insulin  (HCC) -     POCT urinalysis dipstick -     Microalbumin / creatinine urine ratio -     CBC -     CMP14+EGFR -     Hemoglobin A1c  Hypertensive heart disease without heart failure -     POCT urinalysis dipstick -     Microalbumin / creatinine urine ratio  Abnormal kidney function  Muscle ache -     CK  Coronary artery disease involving native coronary artery of native heart with angina pectoris (HCC)  Other orders -     Lidocaine ; Place 1 patch onto the skin daily. Remove & Discard patch within 12 hours or as directed by MD  Dispense: 30 patch; Refill: 0 -     Diclofenac  Sodium; Apply 2 g topically 4 (four) times daily.  Dispense: 50 g; Refill: 1    Return for move feb appt to [facility] and dm check.  Patient was  given opportunity to ask questions. Patient verbalized understanding of the plan and was able to repeat key elements of the plan. All questions were answered to their satisfaction.    I, Bruna Creighton, NP, have reviewed all documentation for this visit. The documentation on 06/08/23 for the exam, diagnosis, procedures, and orders are all accurate and complete.   IF YOU HAVE BEEN REFERRED TO A SPECIALIST, IT MAY TAKE 1-2 WEEKS TO SCHEDULE/PROCESS THE REFERRAL. IF YOU HAVE NOT HEARD FROM US /SPECIALIST IN TWO WEEKS, PLEASE GIVE US  A CALL AT [phone].

## 2023-06-05 ENCOUNTER — Ambulatory Visit: Payer: Medicare HMO | Attending: Cardiology | Admitting: Cardiology

## 2023-06-05 VITALS — BP 130/84 | HR 77 | Ht 67.0 in | Wt 156.6 lb

## 2023-06-05 DIAGNOSIS — I10 Essential (primary) hypertension: Secondary | ICD-10-CM

## 2023-06-05 DIAGNOSIS — E782 Mixed hyperlipidemia: Secondary | ICD-10-CM

## 2023-06-05 DIAGNOSIS — I25119 Atherosclerotic heart disease of native coronary artery with unspecified angina pectoris: Secondary | ICD-10-CM

## 2023-06-05 LAB — CMP14+EGFR
ALT: 21 [IU]/L (ref 0–44)
AST: 24 [IU]/L (ref 0–40)
Albumin: 4.2 g/dL (ref 3.8–4.8)
Alkaline Phosphatase: 123 [IU]/L — ABNORMAL HIGH (ref 44–121)
BUN/Creatinine Ratio: 12 (ref 10–24)
BUN: 11 mg/dL (ref 8–27)
Bilirubin Total: 0.4 mg/dL (ref 0.0–1.2)
CO2: 20 mmol/L (ref 20–29)
Calcium: 9.5 mg/dL (ref 8.6–10.2)
Chloride: 104 mmol/L (ref 96–106)
Creatinine, Ser: 0.92 mg/dL (ref 0.76–1.27)
Globulin, Total: 2.8 g/dL (ref 1.5–4.5)
Glucose: 99 mg/dL (ref 70–99)
Potassium: 3.7 mmol/L (ref 3.5–5.2)
Sodium: 140 mmol/L (ref 134–144)
Total Protein: 7 g/dL (ref 6.0–8.5)
eGFR: 88 mL/min/{1.73_m2} (ref >59)

## 2023-06-05 LAB — CBC
Hematocrit: 41.2 % (ref 37.5–51.0)
Hemoglobin: 13.6 g/dL (ref 13.0–17.7)
MCH: 27.7 pg (ref 26.6–33.0)
MCHC: 33 g/dL (ref 31.5–35.7)
MCV: 84 fL (ref 79–97)
Platelets: 202 10*3/uL (ref 150–450)
RBC: 4.91 x10E6/uL (ref 4.14–5.80)
RDW: 14.3 % (ref 11.6–15.4)
WBC: 6.4 10*3/uL (ref 3.4–10.8)

## 2023-06-05 LAB — HEMOGLOBIN A1C
Est. average glucose Bld gHb Est-mCnc: 140 mg/dL
Hgb A1c MFr Bld: 6.5 % — ABNORMAL HIGH (ref 4.8–5.6)

## 2023-06-05 LAB — MICROALBUMIN / CREATININE URINE RATIO
Creatinine, Urine: 147.8 mg/dL
Microalb/Creat Ratio: 149 mg/g{creat} — ABNORMAL HIGH (ref 0–29)
Microalbumin, Urine: 220.8 ug/mL

## 2023-06-05 LAB — CK: Total CK: 426 U/L — ABNORMAL HIGH (ref 41–331)

## 2023-06-05 NOTE — Progress Notes (Signed)
 Clinical Summary Ronald Faulkner is a 73 y.o.male seen today for follow up of the following medical problems.     1.CAD -history of DES x 2  to LAD in 07/2017 - 10/2019 cath: patent stents LAD, high grade diffuse disease small ramus, normal LCX, OM 50%, RCA 30% distal. LVEDP 20 - 07/2020 echo: LVEF 60-65%, no WMAs, grade I dd, normal RV function.  - 03/2021 nuclear stress: no ischemia - headaches on imdur  90mg  though would be willing to retry.        - some recent chest pains. Come and go, can occur at rest or with activity. Left sided, sharp thumping pain. Can be up to 10/10. No other associated symptoms. Lasts just a few seconds. Occurs about 1-2 times per week - not interested in adjusting antianginals.      2.Hyperlipidemia - difficultly tolerating statin, on crestor  3 days a week and zetia   - he is on zetia  - Jan 2023 TC 167 TG 102 HDL 58 LDL 91. Appears was not taking zetia  at that time.    12/2021 TC 119 TG 103 HDL 49 LDL 51 09/2022 TC 128 TG 81 HDL 61 LDL 51 - 01/2023 TC 862 TG 90 HDL 59 LDL 61 - nonspecific muscle aches, unclear if related to crestor . Last visit we lowered dose, does not appear to have helped much, mainly lower back pains now that sound unrelated.    3. HTN - compliant with meds   4. SOB - ongoing 3-4 months - no coughing, no wheezing - DOE walking 30 to 40 feet. Some associated chest pain. Squeezing like feeling left sided, can be mild to moderate. Pain lasts a few minutes. Symptoms improve with rest. - increasing in frequency.  - occasional LE edema   -ongoing SOB unchanged   12/2021 abnormal PFTs, referred to pulmonary -breathing has improved.        2020 CT no aneurysm      SH: big [employer] fan Past Medical History:  Diagnosis Date   Abnormal EKG 12/08/2011   Arthritis    Atypical chest pain 12/08/2011   CAD (coronary artery disease)    a. 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 Eda on 05/08/2021 -Pigmented   Unstable angina (HCC) 07/2017     Allergies  Allergen Reactions   Bee Venom    Fish Allergy Hives and Swelling   Ivp Dye [Iodinated Contrast Media] Swelling     Current Outpatient Medications  Medication Sig Dispense Refill   allopurinol  (ZYLOPRIM ) 300 MG tablet Take 1 tablet by mouth once daily 90 tablet 0   amLODipine  (NORVASC ) 10 MG tablet Take 1 tablet by mouth once daily 90 tablet 2   aspirin  EC 81 MG tablet Take 81 mg by mouth daily.     diclofenac  Sodium (  VOLTAREN ) 1 % GEL Apply 2 g topically 4 (four) times daily. 50 g 1   ezetimibe  (ZETIA ) 10 MG tablet Take 1 tablet by mouth once daily 90 tablet 1   famotidine  (PEPCID ) 20 MG tablet Take 20 mg by mouth daily.     fluticasone  (FLONASE ) 50 MCG/ACT nasal spray Place 2 sprays into both nostrils daily.     gabapentin (NEURONTIN) 100 MG capsule Take 100 mg by mouth at bedtime.     glucose blood test strip 1 each by Other route as needed for other. Use as instructed 100 each 6   Insulin  Pen Needle 32G X 6 MM MISC Use with ozempic  50 each 3   isosorbide  mononitrate (IMDUR ) 60 MG 24 hr tablet TAKE 1 & 1/2 (ONE & ONE-HALF) TABLETS BY MOUTH ONCE DAILY 135 tablet 3   lidocaine  (LIDODERM ) 5 % Place 1 patch onto the skin daily. Remove & Discard patch within 12 hours or as directed by MD 30 patch 0   linaclotide  (LINZESS ) 72 MCG capsule Take 72 mcg by mouth every  other day.     metFORMIN  (GLUCOPHAGE ) 500 MG tablet TAKE 1 TABLET BY MOUTH TWICE DAILY WITH A MEAL 180 tablet 0   metoprolol  succinate (TOPROL -XL) 50 MG 24 hr tablet Take 1 tablet by mouth once daily 90 tablet 1   nitroGLYCERIN  (NITROSTAT ) 0.4 MG SL tablet DISSOLVE ONE TABLET UNDER THE TONGUE EVERY 5 MINUTES AS NEEDED FOR CHEST PAIN.&nbsp;&nbsp;DO NOT EXCEED A TOTAL OF 3 DOSES IN 15 MINUTES 25 tablet 0   oxymetazoline  (AFRIN) 0.05 % nasal spray Place 1 spray into both nostrils 2 (two) times daily as needed for congestion.     pantoprazole  (PROTONIX ) 40 MG tablet Take 1 tablet (40 mg total) by mouth daily. Take 30-60 min before first meal of the day 30 tablet 2   rosuvastatin  (CRESTOR ) 5 MG tablet Take 1 tablet (5 mg total) by mouth as directed. (Monday, Wednesday, & Friday) 12 tablet 6   Semaglutide , 1 MG/DOSE, (OZEMPIC , 1 MG/DOSE,) 4 MG/3ML SOPN Inject 1 mg into the skin once a week. 9 mL 1   triamcinolone  ointment (KENALOG ) 0.5 % Apply 1 Application topically 2 (two) times daily. 30 g 0   valsartan  (DIOVAN ) 160 MG tablet Take 1 tablet (160 mg total) by mouth daily. 90 tablet 3   No current facility-administered medications for this visit.     Past Surgical History:  Procedure Laterality Date   ABDOMINAL SURGERY     APPENDECTOMY     BREAST SURGERY     benign lump at lt side   COLON SURGERY     COLONOSCOPY  03/14/2011   Procedure: COLONOSCOPY;  Surgeon: Claudis RAYMOND Rivet, MD;  [facility];  Service: Endoscopy;  Laterality: N/A;  1:00   COLONOSCOPY N/A 11/15/2015   Procedure: COLONOSCOPY;  Surgeon: Claudis RAYMOND Rivet, MD;  Location: [facility];  Service: Endoscopy;  Laterality: N/A;  11:15   COLONOSCOPY WITH PROPOFOL  N/A 07/15/2019   Procedure: COLONOSCOPY WITH PROPOFOL ;  Surgeon: Rivet Claudis RAYMOND, MD;  Location: [facility];  Service: Endoscopy;  Laterality: N/A;  1235   CORONARY STENT INTERVENTION N/A 07/31/2017   Procedure: CORONARY STENT INTERVENTION;  Surgeon: Verlin Lonni BIRCH, MD;  Location: [facility];  Service: Cardiovascular;  Laterality: N/A;   LEFT HEART CATH AND CORONARY ANGIOGRAPHY N/A 07/31/2017   Procedure: LEFT HEART CATH AND CORONARY ANGIOGRAPHY;  Surgeon: Verlin Lonni BIRCH, MD;  Location: [facility];  Service: Cardiovascular;  Laterality: N/A;   LEFT HEART CATH AND CORONARY ANGIOGRAPHY N/A 10/28/2019   Procedure: LEFT HEART CATH AND CORONARY ANGIOGRAPHY;  Surgeon: Claudene Victory ORN, MD;  Location: [facility];  Service: Cardiovascular;  Laterality: N/A;   PENILE PROSTHESIS IMPLANT     2016    prostate cancer     2015. prostectomy     Allergies  Allergen Reactions   Bee Venom    Fish Allergy Hives and Swelling   Ivp Dye [Iodinated Contrast Media] Swelling      Family History  Problem Relation Age of Onset   Colon cancer Mother    Coronary artery disease Maternal Grandmother    Anesthesia problems Neg Hx    Hypotension Neg Hx    Malignant hyperthermia Neg Hx    Pseudochol deficiency Neg Hx      Social History Mr. Swindler reports that he quit smoking about 24 years ago. His smoking use included cigarettes. He started smoking about 59 years ago. He has a 35 pack-year smoking history. He has never used smokeless tobacco. Mr. Bohlken reports current alcohol use.   Review of Systems CONSTITUTIONAL: No weight loss, fever, chills, weakness or fatigue.  HEENT: Eyes: No visual loss, blurred vision, double vision or yellow sclerae.No hearing loss, sneezing, congestion, runny nose or sore throat.  SKIN: No rash or itching.  CARDIOVASCULAR: per hpi RESPIRATORY: No shortness of breath, cough or sputum.  GASTROINTESTINAL: No anorexia, nausea, vomiting or diarrhea. No abdominal pain or blood.  GENITOURINARY: No burning on urination, no polyuria NEUROLOGICAL: No headache, dizziness, syncope, paralysis, ataxia, numbness or tingling in the extremities. No change in bowel or bladder control.  MUSCULOSKELETAL: No  muscle, back pain, joint pain or stiffness.  LYMPHATICS: No enlarged nodes. No history of splenectomy.  PSYCHIATRIC: No history of depression or anxiety.  ENDOCRINOLOGIC: No reports of sweating, cold or heat intolerance. No polyuria or polydipsia.  SABRA   Physical Examination Today's Vitals   06/05/23 0909  BP: 130/84  Pulse: 77  SpO2: 98%  Weight: 156 lb 9.6 oz (71 kg)  Height: 5' 7 (1.702 m)   Body mass index is 24.53 kg/m.  Gen: resting comfortably, no acute distress HEENT: no scleral icterus, pupils equal round and reactive, no palptable cervical adenopathy,  CV: RRR, no mrg, no jvd Resp: Clear to auscultation bilaterally GI: abdomen is soft, non-tender, non-distended, normal bowel sounds, no hepatosplenomegaly MSK: extremities are warm, no edema.  Skin: warm, no rash Neuro:  no focal deficits Psych: appropriate affect   Diagnostic Studies  07/2017 cath Prox RCA to Mid RCA lesion is 20% stenosed. Dist RCA lesion is 50% stenosed. Dist Cx lesion is 60% stenosed. Ost Cx to Prox Cx lesion is 20% stenosed. Ost 1st Mrg to 1st Mrg lesion is 70% stenosed. Ost LAD to Prox LAD lesion is 20% stenosed. Mid LAD-1 lesion is 80% stenosed. Mid LAD-2 lesion is 95% stenosed. Dist LAD lesion is 70% stenosed. A drug-eluting stent was successfully placed using a STENT SIERRA 2.25 X 12 MM. Post intervention, there is a 0% residual stenosis. A drug-eluting stent was successfully placed using a STENT SIERRA 2.50 X 18 MM. Post intervention, there is a 0% residual stenosis.   1. Severe stenosis in the mid and distal LAD. Successful PTCA/DES x 1 distal LAD and PTCA/DES x 1 mid LAD.  2. Modeately severe stenosis in small caliber intermediate Emmaleah Meroney.  3. Moderate stenosis in the distal RCA   Post cath Recommendations: Will  continue DAPT with ASA and Plavix  for at least one year. Will continue beta blocker and statin. I would recommend medical management of the disease in the small caliber  intermediate Kentrell Guettler.    10/2019 cath Patent left main Patent LAD mid and distal stent. High-grade diffuse obstructive disease in a branching relatively small ramus intermedius.  Slight progression compared to 2019. Widely patent circumflex with first obtuse marginal containing a 50% proximal narrowing Widely patent dominant right coronary with 30 to 40% narrowing distally. Normal LV function with EF 55%.  LVEDP 20 mmHg.   RECOMMENDATIONS:   Continue current therapy Further medication adjustments and management per Dr.,Konesweran     07/2020 echo 1. Left ventricular ejection fraction, by estimation, is 60 to 65%. The  left ventricle has normal function. The left ventricle has no regional  wall motion abnormalities. Left ventricular diastolic parameters are  consistent with Grade I diastolic  dysfunction (impaired relaxation).   2. Right ventricular systolic function is normal. The right ventricular  size is normal. Tricuspid regurgitation signal is inadequate for assessing  PA pressure.   3. The mitral valve is grossly normal. Trivial mitral valve  regurgitation.   4. The aortic valve is tricuspid. There is mild calcification of the  aortic valve. Aortic valve regurgitation is not visualized.   5. The inferior vena cava is normal in size with greater than 50%  respiratory variability, suggesting right atrial pressure of 3 mmHg.   Comparison(s): Echocardiogram done 10/13/18 showed an EF of 60-65%     03/2021 nuclear stress:  The study is normal. There are no perfusion defects consistent with prior infarct or current ischemia. The study is low risk.   No ST deviation was noted.   There is a moderate size moderate intensity inferior defect that is most intense in the resting images with normal wall motion. Findings consistent with diaphragmatic attenuation and artifact due to adjacet gut tracer uptake.   Left ventricular function is normal. Nuclear stress EF: 67 %. The left  ventricular ejection fraction is hyperdynamic (>65%). End diastolic cavity size is normal.   10/2021 PFTs: minimal obstruction, moderate diffusion defect, +airtrapping     Assessment and Plan   1.CAD with unspecified angina - 2021 cath with stable disease including residual ramus disease, 03/2021 nuclear stress no ischemia - some recent nonspecific chest pains. Discussed trial of titrating antianginals but he is not in favor at this time. Continue to monitor symptoms.  - EKG today SR, no acute ischemic changes   2. Hyperlipidemia - LDL at goal, continue current med - back and leg pains do not seem statin related.    3. HTN - at goal, continue current meds      Dorn PHEBE Ross, M.D.

## 2023-06-05 NOTE — Patient Instructions (Signed)
 Medication Instructions:  Continue all current medications.   Labwork: none  Testing/Procedures: none  Follow-Up: 6 months   Any Other Special Instructions Will Be Listed Below (If Applicable).   If you need a refill on your cardiac medications before your next appointment, please call your pharmacy.

## 2023-06-08 DIAGNOSIS — E119 Type 2 diabetes mellitus without complications: Secondary | ICD-10-CM | POA: Insufficient documentation

## 2023-06-08 DIAGNOSIS — Z23 Encounter for immunization: Secondary | ICD-10-CM | POA: Insufficient documentation

## 2023-06-08 DIAGNOSIS — M545 Low back pain, unspecified: Secondary | ICD-10-CM | POA: Insufficient documentation

## 2023-06-08 DIAGNOSIS — M791 Myalgia, unspecified site: Secondary | ICD-10-CM | POA: Insufficient documentation

## 2023-06-08 NOTE — Assessment & Plan Note (Signed)
 Followed by Cardiology

## 2023-06-08 NOTE — Progress Notes (Signed)
 Just called your phone and left a VM on your phone.  Your A1c 6.5; goal is less than 7, so keep this up. Total CK is elevated, advised to stop CRESTOR  for now but continue with ZETIA  10 mg daily( cholesterol medications) and notify cardiology that CK is elevated, pain in the muscles of your thighs and all over, what should you do about your  cholesterol medications. To continue or discontinue?  Thanks!

## 2023-06-10 ENCOUNTER — Other Ambulatory Visit: Payer: Self-pay | Admitting: Family Medicine

## 2023-06-10 NOTE — Addendum Note (Signed)
 Addended byMoshe Salisbury, Kalon Erhardt E on: 06/10/2023 08:30 AM   Modules accepted: Level of Service

## 2023-06-14 ENCOUNTER — Other Ambulatory Visit: Payer: Self-pay | Admitting: Cardiology

## 2023-07-13 ENCOUNTER — Other Ambulatory Visit: Payer: Self-pay | Admitting: Cardiology

## 2023-07-20 ENCOUNTER — Ambulatory Visit: Payer: Medicare HMO | Admitting: Nurse Practitioner

## 2023-08-11 ENCOUNTER — Other Ambulatory Visit: Payer: Self-pay | Admitting: Nurse Practitioner

## 2023-08-11 ENCOUNTER — Other Ambulatory Visit: Payer: Self-pay | Admitting: Cardiology

## 2023-08-25 ENCOUNTER — Other Ambulatory Visit: Payer: Self-pay

## 2023-08-25 DIAGNOSIS — E1159 Type 2 diabetes mellitus with other circulatory complications: Secondary | ICD-10-CM

## 2023-08-25 MED ORDER — OZEMPIC (1 MG/DOSE) 4 MG/3ML ~~LOC~~ SOPN: 1.0000 mg | PEN_INJECTOR | SUBCUTANEOUS | 1 refills | Status: DC

## 2023-08-26 ENCOUNTER — Other Ambulatory Visit: Payer: Self-pay | Admitting: Cardiology

## 2023-10-06 NOTE — Progress Notes (Signed)
 [place], CMA,acting as a Neurosurgeon for Susanna Epley, FNP.,have documented all relevant documentation on the behalf of Susanna Epley, FNP,as directed by  Susanna Epley, FNP while in the presence of Susanna Epley, FNP.  Subjective:  Patient ID: Ronald Faulkner , male    DOB: Oct 15, 1950 , 73 y.o.   MRN: [id number]  Chief Complaint  Patient presents with   Hypertension   Diabetes    Patient presents today for a bp and dm follow up, Patient reports compliance with medication. Patient denies any chest pain, SOB, or headaches. Patient has no concerns today.     HPI  Here for f/u diabetes. He had his AWV done this morning with [facility]. He had swelling to his left ankle was swelling and he started wearing compression socks. He has seen his heart doctor. Would like to know if he needs to continue ozempic  was on patient assistance. Has appt with ophthalmologist next week, he is to go to the [facility] branch for his eye appt    Past Medical History:  Diagnosis Date   Abnormal EKG 12/08/2011   Arthritis    Atypical chest pain 12/08/2011   CAD (coronary artery disease)    a. 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 Savannah Curlin on 05/08/2021 -Pigmented   Unstable angina (HCC) 07/2017     Family History  Problem Relation Age of Onset   Colon cancer Mother    Coronary artery disease Maternal Grandmother    Anesthesia problems  Neg Hx    Hypotension Neg Hx    Malignant hyperthermia Neg Hx    Pseudochol deficiency Neg Hx      Current Outpatient Medications:    allopurinol  (ZYLOPRIM ) 300 MG tablet, Take 1 tablet by mouth once daily, Disp: 90 tablet, Rfl: 0   amLODipine  (NORVASC ) 10 MG tablet, Take 1 tablet by mouth once daily, Disp: 90 tablet, Rfl: 2   aspirin  EC 81 MG tablet, Take 81 mg by mouth daily., Disp: , Rfl:    diclofenac  Sodium (VOLTAREN ) 1 % GEL, Apply 2 g topically 4 (four) times daily., Disp: 50 g, Rfl: 1   ezetimibe  (ZETIA ) 10 MG tablet, Take 1 tablet by mouth once daily, Disp: 90 tablet, Rfl: 2   fluticasone  (FLONASE ) 50 MCG/ACT nasal spray, Place 2 sprays into both nostrils daily., Disp: , Rfl:    glucose blood test strip, 1 each by Other route as needed for other. Use as instructed, Disp: 100 each, Rfl: 6   Insulin  Pen Needle 32G X 6 MM MISC, Use with ozempic , Disp: 50 each, Rfl: 3   isosorbide  mononitrate (IMDUR ) 60 MG 24 hr tablet, TAKE 1 & 1/2 (ONE & ONE-HALF) TABLETS BY MOUTH ONCE DAILY, Disp: 135 tablet, Rfl: 1   linaclotide  (LINZESS ) 72 MCG capsule, Take 72 mcg by mouth daily as needed., Disp: ,  Rfl:    metFORMIN  (GLUCOPHAGE ) 500 MG tablet, TAKE 1 TABLET BY MOUTH TWICE DAILY WITH A MEAL, Disp: 180 tablet, Rfl: 0   metoprolol  succinate (TOPROL -XL) 50 MG 24 hr tablet, Take 1 tablet by mouth once daily, Disp: 90 tablet, Rfl: 1   nitroGLYCERIN  (NITROSTAT ) 0.4 MG SL tablet, DISSOLVE ONE TABLET UNDER THE TONGUE EVERY 5 MINUTES AS NEEDED FOR CHEST PAIN.&nbsp;&nbsp;DO NOT EXCEED A TOTAL OF 3 DOSES IN 15 MINUTES, Disp: 25 tablet, Rfl: 0   oxymetazoline  (AFRIN) 0.05 % nasal spray, Place 1 spray into both nostrils 2 (two) times daily as needed for congestion., Disp: , Rfl:    rosuvastatin  (CRESTOR ) 5 MG tablet, TAKE 1 TABLET BY MOUTH ON MONDAY, WEDNESDAY AND FRIDAY AS DIRECTED, Disp: 12 tablet, Rfl: 6   triamcinolone  ointment (KENALOG ) 0.5 %, Apply 1 Application topically 2 (two) times daily., Disp: 30 g,  Rfl: 0   valsartan  (DIOVAN ) 160 MG tablet, Take 1 tablet (160 mg total) by mouth daily., Disp: 90 tablet, Rfl: 3   pantoprazole  (PROTONIX ) 40 MG tablet, Take 1 tablet (40 mg total) by mouth daily. Take 30-60 min before first meal of the day (Patient not taking: Reported on 06/05/2023), Disp: 30 tablet, Rfl: 2   Semaglutide , 1 MG/DOSE, (OZEMPIC , 1 MG/DOSE,) 4 MG/3ML SOPN, Inject 1 mg into the skin once a week. (Patient not taking: Reported on 10/07/2023), Disp: 9 mL, Rfl: 1   Allergies  Allergen Reactions   Bee Venom    Fish Allergy Hives and Swelling   Ivp Dye [Iodinated Contrast Media] Swelling     Review of Systems  Constitutional: Negative.   Respiratory: Negative.  Negative for shortness of breath (when lifting a heavy object and walking).   Cardiovascular: Negative.   Gastrointestinal: Negative.   Skin:        Left leg itching  Neurological: Negative.  Negative for dizziness and headaches.  Psychiatric/Behavioral: Negative.       Today's Vitals   10/07/23 1130  BP: 124/70  Pulse: 84  Temp: 98.2 F (36.8 C)  TempSrc: Oral  Weight: 160 lb (72.6 kg)  Height: 5\' 7"  (1.702 m)  PainSc: 0-No pain   Body mass index is 25.06 kg/m.  Wt Readings from Last 3 Encounters:  10/07/23 160 lb (72.6 kg)  10/07/23 160 lb 12.8 oz (72.9 kg)  06/05/23 156 lb 9.6 oz (71 kg)     Objective:  Physical Exam Vitals and nursing note reviewed.  Constitutional:      General: He is not in acute distress.    Appearance: Normal appearance.  Cardiovascular:     Rate and Rhythm: Normal rate and regular rhythm.     Pulses: Normal pulses.     Heart sounds: Normal heart sounds. No murmur heard. Pulmonary:     Effort: Pulmonary effort is normal. No respiratory distress.     Breath sounds: Normal breath sounds. No wheezing.  Skin:    General: Skin is warm and dry.     Coloration: Skin is not jaundiced.     Findings: No rash.  Neurological:     General: No focal deficit present.     Mental Status:  He is alert and oriented to person, place, and time.     Cranial Nerves: No cranial nerve deficit.     Motor: No weakness.  Psychiatric:        Mood and Affect: Mood normal.        Behavior: Behavior normal.  Thought Content: Thought content normal.        Judgment: Judgment normal.         Assessment And Plan:  Type 2 diabetes mellitus with other circulatory complication, without long-term current use of insulin  (HCC) Assessment & Plan: A1c has been stable.   Orders: -     BMP8+eGFR -     Hemoglobin A1c  Hypertensive heart disease with chronic diastolic congestive heart failure (HCC) Assessment & Plan: Blood pressure is well controlled, continue current medications. Will check eGFR  Orders: -     BMP8+eGFR  Malignant neoplasm of prostate (HCC)    Return for keep same next.  Patient was given opportunity to ask questions. Patient verbalized understanding of the plan and was able to repeat key elements of the plan. All questions were answered to their satisfaction.    Inge Mangle, FNP, have reviewed all documentation for this visit. The documentation on 10/07/23 for the exam, diagnosis, procedures, and orders are all accurate and complete.   IF YOU HAVE BEEN REFERRED TO A SPECIALIST, IT MAY TAKE 1-2 WEEKS TO SCHEDULE/PROCESS THE REFERRAL. IF YOU HAVE NOT HEARD FROM US /SPECIALIST IN TWO WEEKS, PLEASE GIVE US  A CALL AT [phone].

## 2023-10-07 ENCOUNTER — Ambulatory Visit (INDEPENDENT_AMBULATORY_CARE_PROVIDER_SITE_OTHER): Payer: Self-pay | Admitting: Nurse Practitioner

## 2023-10-07 ENCOUNTER — Encounter: Payer: Self-pay | Admitting: Nurse Practitioner

## 2023-10-07 ENCOUNTER — Ambulatory Visit: Payer: Medicare HMO

## 2023-10-07 ENCOUNTER — Encounter: Payer: Self-pay | Admitting: Pharmacist

## 2023-10-07 VITALS — BP 124/70 | HR 84 | Temp 98.2°F | Ht 67.0 in | Wt 160.0 lb

## 2023-10-07 VITALS — BP 124/70 | HR 84 | Temp 98.2°F | Ht 67.0 in | Wt 160.8 lb

## 2023-10-07 DIAGNOSIS — E1159 Type 2 diabetes mellitus with other circulatory complications: Secondary | ICD-10-CM

## 2023-10-07 DIAGNOSIS — Z0000 Encounter for general adult medical examination without abnormal findings: Secondary | ICD-10-CM

## 2023-10-07 DIAGNOSIS — I110 Hypertensive heart disease with heart failure: Secondary | ICD-10-CM

## 2023-10-07 DIAGNOSIS — I119 Hypertensive heart disease without heart failure: Secondary | ICD-10-CM

## 2023-10-07 DIAGNOSIS — I5032 Chronic diastolic (congestive) heart failure: Secondary | ICD-10-CM

## 2023-10-07 DIAGNOSIS — C61 Malignant neoplasm of prostate: Secondary | ICD-10-CM

## 2023-10-07 DIAGNOSIS — Z23 Encounter for immunization: Secondary | ICD-10-CM

## 2023-10-07 NOTE — Patient Instructions (Signed)
 [name] , Thank you for taking time to come for your Medicare Wellness Visit. I appreciate your ongoing commitment to your health goals. Please review the following plan we discussed and let me know if I can assist you in the future.   Referrals/Orders/Follow-Ups/Clinician Recommendations: none  This is a list of the screening recommended for you and due dates:  Health Maintenance  Topic Date Due   Complete foot exam   01/02/2023   COVID-19 Vaccine (5 - 2024-25 season) 02/01/2023   Eye exam for diabetics  09/09/2023   Hemoglobin A1C  12/02/2023   Flu Shot  01/01/2024   Yearly kidney function blood test for diabetes  06/03/2024   Yearly kidney health urinalysis for diabetes  06/03/2024   Colon Cancer Screening  07/14/2024   Medicare Annual Wellness Visit  10/06/2024   DTaP/Tdap/Td vaccine (2 - Td or Tdap) 09/06/2030   Pneumonia Vaccine  Completed   Hepatitis C Screening  Completed   Zoster (Shingles) Vaccine  Completed   HPV Vaccine  Aged Out   Meningitis B Vaccine  Aged Out    Advanced directives: (Declined) Advance directive discussed with you today. Even though you declined this today, please call our office should you change your mind, and we can give you the proper paperwork for you to fill out.  Next Medicare Annual Wellness Visit scheduled for next year: Yes  Have you seen your provider in the last 6 months (3 months if uncontrolled diabetes)? Yes, sees PCP next  insert Preventive Care attachment Insert FALL PREVENTION attachment if needed

## 2023-10-07 NOTE — Progress Notes (Signed)
   10/07/2023  Patient ID: Ronald Faulkner, male   DOB: 10/29/1950, 73 y.o.   MRN: [id number]  Patient was in clinic today to see Susanna Epley, NP and reported being out of Ozempic .  Patient was approved for Ozempic  through [employer]'s Patient Assistance Program through 06/02/2023.  He did not have a renewal application done last year. While Patient was in the office, a new application was completed on his behalf and signatures were obtained.    Patient's A1c from January was 6.5%. He had new labs drawn today.  He has been out of Ozempic  for a few weeks.   Plan: Get signatures from the PCP. Fax application to Nettie Barb, CPhT to be sent to the company and scanned into the Patient's chart. Follow up with the Patient in 2 weeks for medication review after getting his new labs and to follow up on medication assistance.   Geronimo Krabbe, PharmD, BCACP Clinical Pharmacist [phone]

## 2023-10-07 NOTE — Progress Notes (Signed)
 Subjective:   Ronald Faulkner is a 73 y.o. who presents for a Medicare Wellness preventive visit.  Visit Complete: In person    Persons Participating in Visit: Patient.  AWV Questionnaire: No: Patient Medicare AWV questionnaire was not completed prior to this visit.  Cardiac Risk Factors include: advanced age (>20men, >67 women);diabetes mellitus;hypertension;male gender     Objective:    Today's Vitals   10/07/23 1103  BP: 124/70  Pulse: 84  Temp: 98.2 F (36.8 C)  TempSrc: Oral  SpO2: 96%  Weight: 160 lb 12.8 oz (72.9 kg)  Height: 5\' 7"  (1.702 m)   Body mass index is 25.18 kg/m.     10/07/2023   11:11 AM 09/24/2022   11:50 AM 06/04/2022    3:54 PM 09/26/2021    2:00 PM 09/05/2020    2:45 PM 10/28/2019    8:11 AM 07/13/2019    9:12 AM  Advanced Directives  Does Patient Have a Medical Advance Directive? No No No No No No No  Would patient like information on creating a medical advance directive? No - Patient declined  No - Patient declined   No - Patient declined Yes (MAU/Ambulatory/Procedural Areas - Information given)    Current Medications (verified) Outpatient Encounter Medications as of 10/07/2023  Medication Sig   allopurinol  (ZYLOPRIM ) 300 MG tablet Take 1 tablet by mouth once daily   amLODipine  (NORVASC ) 10 MG tablet Take 1 tablet by mouth once daily   aspirin  EC 81 MG tablet Take 81 mg by mouth daily.   diclofenac  Sodium (VOLTAREN ) 1 % GEL Apply 2 g topically 4 (four) times daily.   ezetimibe  (ZETIA ) 10 MG tablet Take 1 tablet by mouth once daily   fluticasone  (FLONASE ) 50 MCG/ACT nasal spray Place 2 sprays into both nostrils daily.   glucose blood test strip 1 each by Other route as needed for other. Use as instructed   Insulin  Pen Needle 32G X 6 MM MISC Use with ozempic    isosorbide  mononitrate (IMDUR ) 60 MG 24 hr tablet TAKE 1 & 1/2 (ONE & ONE-HALF) TABLETS BY MOUTH ONCE DAILY   linaclotide  (LINZESS ) 72 MCG capsule Take 72 mcg by mouth daily as needed.    metFORMIN  (GLUCOPHAGE ) 500 MG tablet TAKE 1 TABLET BY MOUTH TWICE DAILY WITH A MEAL   metoprolol  succinate (TOPROL -XL) 50 MG 24 hr tablet Take 1 tablet by mouth once daily   nitroGLYCERIN  (NITROSTAT ) 0.4 MG SL tablet DISSOLVE ONE TABLET UNDER THE TONGUE EVERY 5 MINUTES AS NEEDED FOR CHEST PAIN.&nbsp;&nbsp;DO NOT EXCEED A TOTAL OF 3 DOSES IN 15 MINUTES   oxymetazoline  (AFRIN) 0.05 % nasal spray Place 1 spray into both nostrils 2 (two) times daily as needed for congestion.   rosuvastatin  (CRESTOR ) 5 MG tablet TAKE 1 TABLET BY MOUTH ON MONDAY, WEDNESDAY AND FRIDAY AS DIRECTED   triamcinolone  ointment (KENALOG ) 0.5 % Apply 1 Application topically 2 (two) times daily.   valsartan  (DIOVAN ) 160 MG tablet Take 1 tablet (160 mg total) by mouth daily.   famotidine  (PEPCID ) 20 MG tablet Take 20 mg by mouth daily. (Patient not taking: Reported on 10/07/2023)   gabapentin (NEURONTIN) 100 MG capsule Take 100 mg by mouth at bedtime. (Patient not taking: Reported on 10/07/2023)   lidocaine  (LIDODERM ) 5 % Place 1 patch onto the skin daily. Remove & Discard patch within 12 hours or as directed by MD (Patient not taking: Reported on 10/07/2023)   pantoprazole  (PROTONIX ) 40 MG tablet Take 1 tablet (40 mg total) by mouth  daily. Take 30-60 min before first meal of the day (Patient not taking: Reported on 10/07/2023)   Semaglutide , 1 MG/DOSE, (OZEMPIC , 1 MG/DOSE,) 4 MG/3ML SOPN Inject 1 mg into the skin once a week. (Patient not taking: Reported on 10/07/2023)   No facility-administered encounter medications on file as of 10/07/2023.    Allergies (verified) Bee venom, Fish allergy, and Ivp dye [iodinated contrast media]   History: Past Medical History:  Diagnosis Date   Abnormal EKG 12/08/2011   Arthritis    Atypical chest pain 12/08/2011   CAD (coronary artery disease)    a. 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 Savannah Curlin on 05/08/2021 -Pigmented   Unstable angina (HCC) 07/2017   Past Surgical History:  Procedure Laterality Date   ABDOMINAL SURGERY     APPENDECTOMY     BREAST SURGERY     benign lump at lt side   COLON SURGERY     COLONOSCOPY  03/14/2011   Procedure: COLONOSCOPY;  Surgeon: Ruby Corporal, MD;  Location: [facility];  Service: Endoscopy;  Laterality: N/A;  1:00   COLONOSCOPY N/A 11/15/2015   Procedure: COLONOSCOPY;  Surgeon: Ruby Corporal, MD;  Location: [facility];  Service: Endoscopy;  Laterality: N/A;  11:15   COLONOSCOPY WITH PROPOFOL  N/A 07/15/2019   Procedure: COLONOSCOPY WITH PROPOFOL ;  Surgeon: Ruby Corporal, MD;  Location: [facility];  Service: Endoscopy;  Laterality: N/A;  1235   CORONARY STENT INTERVENTION N/A 07/31/2017   Procedure: CORONARY STENT INTERVENTION;  Surgeon: Odie Benne, MD;  Location: [facility];  Service: Cardiovascular;  Laterality: N/A;   LEFT HEART CATH AND CORONARY ANGIOGRAPHY N/A 07/31/2017   Procedure: LEFT HEART CATH AND CORONARY ANGIOGRAPHY;  Surgeon: Odie Benne, MD;  Location: [facility] INV[facility] CV LAB;  Service: Cardiovascular;  Laterality: N/A;   LEFT HEART CATH AND CORONARY ANGIOGRAPHY N/A 10/28/2019   Procedure: LEFT HEART CATH AND CORONARY ANGIOGRAPHY;  Surgeon: Arty Binning, MD;  Location: [facility];  Service: Cardiovascular;  Laterality: N/A;   PENILE PROSTHESIS IMPLANT     2016    prostate cancer     2015. prostectomy    Family History  Problem Relation Age of Onset   Colon cancer Mother    Coronary artery disease Maternal Grandmother    Anesthesia problems Neg Hx    Hypotension Neg Hx    Malignant hyperthermia Neg Hx    Pseudochol deficiency Neg Hx    Social History   Socioeconomic History   Marital status: Married    Spouse name: Not on file   Number of children: 2   Years of education: Not on file   Highest education level: Not on file  Occupational History    Employer: [employer]  Tobacco Use   Smoking status:  Former    Current packs/day: 0.00    Average packs/day: 1 pack/day for 35.0 years (35.0 ttl pk-yrs)    Types: Cigarettes    Start date: 06/02/1964    Quit date: 06/03/1999    Years since quitting: 24.3   Smokeless tobacco: Never  Vaping Use   Vaping status: Never Used  Substance and Sexual Activity   Alcohol use: Yes    Comment: occ   Drug use: No    Comment: hx of use in his colege days   Sexual activity: Yes  Other Topics Concern   Not on file  Social History Narrative   Lives at home with wife and daughter and her three children.     Social Drivers of Corporate investment banker Strain: Low Risk  (10/07/2023)   Overall Financial Resource Strain (CARDIA)    Difficulty of Paying Living Expenses: Not hard at all  Food Insecurity: No Food Insecurity (10/07/2023)   Hunger Vital Sign    Worried About Running Out of Food in the Last Year: Never true    Ran Out of Food in the Last Year: Never true  Transportation Needs: No Transportation Needs (10/07/2023)   PRAPARE - Administrator, Civil Service (Medical): No    Lack of Transportation (Non-Medical): No  Physical Activity: Inactive (09/24/2022)   Exercise Vital Sign    Days of Exercise per Week: 0 days    Minutes of Exercise per Session: 0 min  Stress: No Stress Concern Present (10/07/2023)   [employer] of Occupational Health - Occupational Stress Questionnaire    Feeling of Stress : Not at all   Social Connections: Socially Integrated (10/07/2023)   Social Connection and Isolation Panel [NHANES]    Frequency of Communication with Friends and Family: More than three times a week    Frequency of Social Gatherings with Friends and Family: More than three times a week    Attends Religious Services: More than 4 times per year    Active Member of [employer] or Organizations: Yes    Attends Engineer, structural: More than 4 times per year    Marital Status: Married    Tobacco Counseling Counseling given: Not Answered    Clinical Intake:  Pre-visit preparation completed: Yes  Pain : No/denies pain     Nutritional Status: BMI 25 -29 Overweight Nutritional Risks: None Diabetes: Yes CBG done?: No Did pt. bring in CBG monitor from home?: No  Lab Results  Component Value Date   HGBA1C 6.5 (H) 06/04/2023   HGBA1C 6.4 (H) 01/16/2023   HGBA1C 6.5 (H) 09/24/2022     How often do you need to have someone help you when you read instructions, pamphlets, or other written materials from your doctor or pharmacy?: 1 - Never  Interpreter Needed?: No  Information entered by :: NAllen LPN   Activities of Daily Living     10/07/2023   11:04 AM  In your present state of health, do you have any difficulty performing the following activities:  Hearing? 0  Vision? 1  Comment has eye appt next week  Difficulty concentrating or making decisions? 0  Walking or climbing stairs? 0  Dressing or bathing? 0  Doing errands, shopping? 0  Preparing Food and eating ? N  Using the Toilet? N  In the past six months, have you accidently leaked urine? Y  Comment no prostate  Do you have problems with loss of bowel control? N  Managing your Medications?  N  Managing your Finances? N  Housekeeping or managing your Housekeeping? N    Patient Care Team: Susanna Epley, FNP as PCP - General (General Practice) Amanda Jungling Joyceann No, MD as PCP - Cardiology (Cardiology) Pearson, Vallie J, [facility]  (Inactive) (Pharmacist)  Indicate any recent Medical Services you may have received from other than [facility] providers in the past year (date may be approximate).     Assessment:   This is a routine wellness examination for [facility].  Hearing/Vision screen Hearing Screening - Comments:: Denies hearing issues Vision Screening - Comments:: Regular eye exams, [facility]   Goals Addressed             This Visit's Progress    Patient Stated       10/07/2023, wants to lose stomach       Depression Screen     10/07/2023   11:12 AM 01/16/2023   10:29 AM 09/24/2022   11:50 AM 05/07/2022   10:27 AM 01/01/2022   10:27 AM 09/26/2021    2:01 PM 09/12/2021    3:03 PM  PHQ 2/9 Scores  PHQ - 2 Score 0 0 0 0 0 0 0  PHQ- 9 Score 2 0     0    Fall Risk     10/07/2023   11:12 AM 01/16/2023   10:28 AM 09/24/2022   11:50 AM 05/07/2022   10:27 AM 01/01/2022   10:27 AM  Fall Risk   Falls in the past year? 0 0 0 0 0  Number falls in past yr: 0 0 0 0 0  Injury with Fall? 0 0 0 0 0  Risk for fall due to : Medication side effect No Fall Risks Medication side effect No Fall Risks No Fall Risks  Follow up Falls prevention discussed;Falls evaluation completed Falls evaluation completed Falls prevention discussed;Education provided;Falls evaluation completed Falls evaluation completed Falls evaluation completed    MEDICARE RISK AT HOME:  Medicare Risk at Home Any stairs in or around the home?: Yes If so, are there any without handrails?: No Home free of loose throw rugs in walkways, pet beds, electrical cords, etc?: Yes Adequate lighting in your home to reduce risk of falls?: Yes Life alert?: No Use of a cane, walker or w/c?: No Grab bars in the bathroom?: No Shower chair or bench in shower?: No Elevated toilet seat or a handicapped toilet?: Yes  TIMED UP AND GO:  Was the test performed?  Yes  Length of time to ambulate 10 feet: 5 sec Gait steady and fast without use of assistive device  Cognitive  Function: 6CIT completed        10/07/2023   11:14 AM 09/24/2022   11:52 AM 09/26/2021    2:02 PM 09/05/2020    2:48 PM  6CIT Screen  What Year? 0 points 0 points 0 points 0 points  What month? 0 points 0 points 0 points 0 points  What time? 0 points 0 points 0 points 0 points  Count back from 20 0 points 2 points 0 points 0 points  Months in reverse 0 points 0 points 0 points 0 points  Repeat phrase 4 points 2 points 2 points 2 points  Total Score 4 points 4 points 2 points 2 points    Immunizations Immunization History  Administered Date(s) Administered   Fluad Quad(high Dose 65+) 02/27/2021, 02/28/2022   Influenza, High Dose Seasonal PF 05/27/2017   Influenza-Unspecified 03/14/2020   Moderna Sars-Covid-2 Vaccination 07/28/2019, 08/26/2019, 06/05/2020   PNEUMOCOCCAL  CONJUGATE-20 09/05/2020   Pfizer(Comirnaty)Fall Seasonal Vaccine 12 years and older 09/24/2022   Tdap 09/05/2020   Zoster Recombinant(Shingrix) 12/20/2020, 02/19/2021    Screening Tests Health Maintenance  Topic Date Due   FOOT EXAM  01/02/2023   COVID-19 Vaccine (5 - 2024-25 season) 02/01/2023   OPHTHALMOLOGY EXAM  09/09/2023   HEMOGLOBIN A1C  12/02/2023   INFLUENZA VACCINE  01/01/2024   Diabetic kidney evaluation - eGFR measurement  06/03/2024   Diabetic kidney evaluation - Urine ACR  06/03/2024   Colonoscopy  07/14/2024   Medicare Annual Wellness (AWV)  10/06/2024   DTaP/Tdap/Td (2 - Td or Tdap) 09/06/2030   Pneumonia Vaccine 42+ Years old  Completed   Hepatitis C Screening  Completed   Zoster Vaccines- Shingrix  Completed   HPV VACCINES  Aged Out   Meningococcal B Vaccine  Aged Out    Health Maintenance  Health Maintenance Due  Topic Date Due   FOOT EXAM  01/02/2023   COVID-19 Vaccine (5 - 2024-25 season) 02/01/2023   OPHTHALMOLOGY EXAM  09/09/2023   Health Maintenance Items Addressed: Has an eye appointment next week. Has appointment with provider today for foot exam.  Additional  Screening:  Vision Screening: Recommended annual ophthalmology exams for early detection of glaucoma and other disorders of the eye.  Dental Screening: Recommended annual dental exams for proper oral hygiene  Community Resource Referral / Chronic Care Management: CRR required this visit?  No   CCM required this visit?  No     Plan:     I have personally reviewed and noted the following in the patient's chart:   Medical and social history Use of alcohol, tobacco or illicit drugs  Current medications and supplements including opioid prescriptions. Patient is not currently taking opioid prescriptions. Functional ability and status Nutritional status Physical activity Advanced directives List of other physicians Hospitalizations, surgeries, and ER visits in previous 12 months Vitals Screenings to include cognitive, depression, and falls Referrals and appointments  In addition, I have reviewed and discussed with patient certain preventive protocols, quality metrics, and best practice recommendations. A written personalized care plan for preventive services as well as general preventive health recommendations were provided to patient.     Areatha Beecham, LPN   06/07/1094   After Visit Summary: (In Person-Printed) AVS printed and given to the patient  Notes: Nothing significant to report at this time.

## 2023-10-08 ENCOUNTER — Telehealth: Payer: Self-pay

## 2023-10-08 LAB — BMP8+EGFR
BUN/Creatinine Ratio: 12 (ref 10–24)
BUN: 10 mg/dL (ref 8–27)
CO2: 18 mmol/L — ABNORMAL LOW (ref 20–29)
Calcium: 9.7 mg/dL (ref 8.6–10.2)
Chloride: 105 mmol/L (ref 96–106)
Creatinine, Ser: 0.82 mg/dL (ref 0.76–1.27)
Glucose: 124 mg/dL — ABNORMAL HIGH (ref 70–99)
Potassium: 4.8 mmol/L (ref 3.5–5.2)
Sodium: 144 mmol/L (ref 134–144)
eGFR: 93 mL/min/{1.73_m2} (ref >59)

## 2023-10-08 LAB — HEMOGLOBIN A1C
Est. average glucose Bld gHb Est-mCnc: 146 mg/dL
Hgb A1c MFr Bld: 6.7 % — ABNORMAL HIGH (ref 4.8–5.6)

## 2023-10-08 NOTE — Telephone Encounter (Signed)
 PAP: Application for Ozempic has been submitted to [employer], via fax

## 2023-10-12 NOTE — Progress Notes (Signed)
 Pharmacy Medication Assistance Program Note    10/12/2023  Patient ID: Ronald Faulkner, male   DOB: 07-18-1950, 73 y.o.   MRN: [id number]     10/12/2023  Outreach Medication One  Initial Outreach Date (Medication One) 10/08/2023  Manufacturer Medication One Novo Nordisk  [employer] [facility] Ozempic   Dose of Ozempic  1 mg  Type of Radiographer, therapeutic Assistance  Date Application Sent to Patient 10/08/2023  Application Items Requested Application  Date Application Sent to Prescriber 10/08/2023  Name of Prescriber Susanna Epley  Date Application Received From Patient 10/08/2023  Application Items Received From Patient Application  Date Application Received From Provider 10/08/2023  Method Application Sent to Manufacturer Fax  Patient Assistance Determination Approved  Approval Start Date 10/12/2023  Approval End Date 06/01/2024  Patient Notification Method Telephone Call  Telephone Call Outcome Left Voicemail

## 2023-10-12 NOTE — Telephone Encounter (Signed)
 PAP: Patient assistance application for Ozempic  has been approved by PAP Companies: [employer] from 10/12/2023 to 06/01/2024. Medication should be delivered to PAP Delivery: Provider's office. For further shipping updates, please contact [employer] at [phone]. Patient ID is: Not provided

## 2023-10-14 ENCOUNTER — Ambulatory Visit: Payer: Self-pay | Admitting: Nurse Practitioner

## 2023-10-14 NOTE — Assessment & Plan Note (Signed)
 Blood pressure is well controlled, continue current medications. Will check eGFR

## 2023-10-14 NOTE — Assessment & Plan Note (Signed)
 A1c has been stable.

## 2023-10-21 ENCOUNTER — Telehealth: Payer: Self-pay | Admitting: Pharmacist

## 2023-10-21 DIAGNOSIS — E1165 Type 2 diabetes mellitus with hyperglycemia: Secondary | ICD-10-CM

## 2023-10-21 DIAGNOSIS — E1169 Type 2 diabetes mellitus with other specified complication: Secondary | ICD-10-CM

## 2023-10-21 DIAGNOSIS — E0800 Diabetes mellitus due to underlying condition with hyperosmolarity without nonketotic hyperglycemic-hyperosmolar coma (NKHHC): Secondary | ICD-10-CM

## 2023-10-21 MED ORDER — LANCET DEVICE MISC: 0 refills | Status: AC

## 2023-10-21 MED ORDER — LANCETS MISC. MISC: 0 refills | Status: AC

## 2023-10-21 MED ORDER — ROSUVASTATIN CALCIUM 5 MG PO TABS: ORAL_TABLET | ORAL | 3 refills | Status: AC

## 2023-10-21 MED ORDER — BLOOD GLUCOSE TEST VI STRP
ORAL_STRIP | 3 refills | Status: AC
Start: 2023-10-21 — End: ?

## 2023-10-21 MED ORDER — BLOOD GLUCOSE MONITORING SUPPL DEVI: 0 refills | Status: AC

## 2023-10-21 NOTE — Progress Notes (Signed)
 10/21/2023 Name: Ronald Faulkner MRN: [id number] DOB: 1950/10/14  Chief Complaint  Patient presents with   Medication Management    Diabetes     Ronald Faulkner is a 73 y.o. year old male who presented for a telephone visit.   They were referred to the pharmacist by their PCP for assistance in managing medication access.    Subjective:  Patient is a 73 year old male with multiple medical conditions including but not limited to:  type 2 diabetes, CAD, unstable angina , allergic rhinitis, GERD, and gout. Purpose of today's call was to complete a medication review and to let the patient know he has been approved to receive Ozempic  through [employer]'s Program through 06/01/24.    Care Team: Primary Care Provider: Susanna Epley, FNP ; Next Scheduled Visit: 01/19/24   Medication Access/Adherence  Current Pharmacy:  [facility] [address]  - [address] [place] [address] Phone: [phone] Fax: [phone]   Patient reports affordability concerns with their medications: Yes  Gets Ozempic  through [employer]'s Patient Assistance Program Patient reports access/transportation concerns to their pharmacy: No  Patient reports adherence concerns with their medications:  Yes  Ran out of Ozempic  3 months ago   Diabetes:  Current medications:  Ozempic  1 mg weekly Metformin  500 mg 1 tablet twice daily with a meal    Current glucose readings:  Patient said he tests his blood sugar three times per week but that his meter has not been working. Using One Touch  meter  Patient denies hypoglycemic s/sx including  dizziness, shakiness, sweating. Patient denies hyperglycemic symptoms including  polyuria, polydipsia, polyphagia, nocturia, neuropathy, blurred vision.  Recently approved to receive Ozempic  through [employer]'s Patient Assistance Program.  Office will call when his shipment arrives.  Hypertension:  Current medications:   Valsartan  160 mg 1  tablet daily Isosorbide  MN 60 mg 1&1/2 tablet daily    Hyperlipidemia/ASCVD Risk Reduction  Current lipid lowering medications:  Rosuvastatin  5 mg 1 tablet on M,W,F  Antiplatelet regimen:  Aspirin  81 mg 1 tablet daily  ASCVD History:  Family History:  Risk Factors:   Clinical ASCVD: Yes  The 10-year ASCVD risk score (Arnett DK, et al., 2019) is: 29.9%   Values used to calculate the score:     Age: 62 years     Sex: Male     Is Non-Hispanic African American: Yes     Diabetic: Yes     Tobacco smoker: No     Systolic Blood Pressure: 124 mmHg     Is BP treated: Yes     HDL Cholesterol: 59 mg/dL     Total Cholesterol: 137 mg/dL   Objective:  Lab Results  Component Value Date   HGBA1C 6.7 (H) 10/07/2023    Lab Results  Component Value Date   CREATININE 0.82 10/07/2023   BUN 10 10/07/2023   NA 144 10/07/2023   K 4.8 10/07/2023   CL 105 10/07/2023   CO2 18 (L) 10/07/2023    Lab Results  Component Value Date   CHOL 137 01/16/2023   HDL 59 01/16/2023   LDLCALC 61 01/16/2023   TRIG 90 01/16/2023   CHOLHDL 2.3 01/16/2023    Medications Reviewed Today     Reviewed by Geronimo Krabbe, RPH (Pharmacist) on 10/21/23 at 1103  Med List Status: <None>   Medication Order Taking? Sig Documenting Provider Last Dose Status Informant  allopurinol  (ZYLOPRIM ) 300 MG tablet [id number] Yes  Take 1 tablet by mouth once daily Susanna Epley, FNP Taking Active   amLODipine  (NORVASC ) 10 MG tablet [id number] Yes Take 1 tablet by mouth once daily Laurann Pollock, MD Taking Active   aspirin  EC 81 MG tablet [id number] Yes Take 81 mg by mouth daily. [provider] Taking Active Self  diclofenac  Sodium (VOLTAREN ) 1 % GEL [id number] Yes Apply 2 g topically 4 (four) times daily. Melodie Spry, NP Taking Active            Med Note Vandy Genera Oct 21, 2023 11:01 AM) As needed  ezetimibe  (ZETIA ) 10 MG tablet [id number] Yes Take 1 tablet by mouth once daily Laurann Pollock, MD  Taking Active   fluticasone  (FLONASE ) 50 MCG/ACT nasal spray [id number] Yes Place 2 sprays into both nostrils daily. [provider] Taking Active            Med Note Geronimo Krabbe   Wed Oct 21, 2023 11:01 AM) As needed   glucose blood test strip [id number] Yes 1 each by Other route as needed for other. Use as instructed Susanna Epley, FNP Taking Active   Insulin  Pen Needle 32G X 6 MM MISC [id number] Yes Use with ozempic  Moore, [name], FNP Taking Active   isosorbide  mononitrate (IMDUR ) 60 MG 24 hr tablet [id number] Yes TAKE 1 & 1/2 (ONE & ONE-HALF) TABLETS BY MOUTH ONCE DAILY Branch, Joyceann No, MD Taking Active   linaclotide  (LINZESS ) 72 MCG capsule [id number] Yes Take 72 mcg by mouth daily as needed. [provider] Taking Active Self           Med Note Vandy Genera Oct 21, 2023 11:02 AM) As needed   metFORMIN  (GLUCOPHAGE ) 500 MG tablet [id number] Yes TAKE 1 TABLET BY MOUTH TWICE DAILY WITH A MEAL Susanna Epley, FNP Taking Active   metoprolol  succinate (TOPROL -XL) 50 MG 24 hr tablet [id number] Yes Take 1 tablet by mouth once daily Laurann Pollock, MD Taking Active   nitroGLYCERIN  (NITROSTAT ) 0.4 MG SL tablet [id number] Yes DISSOLVE ONE TABLET UNDER THE TONGUE EVERY 5 MINUTES AS NEEDED FOR CHEST PAIN.&nbsp;&nbsp;DO NOT EXCEED A TOTAL OF 3 DOSES IN 15 MINUTES Susanna Epley, FNP Taking Active   oxymetazoline  (AFRIN) 0.05 % nasal spray [id number] Yes Place 1 spray into both nostrils 2 (two) times daily as needed for congestion. [provider] Taking Active            Med Note Deetta Farrow, Frazier Jacob Jul 29, 2017  6:19 PM)    rosuvastatin  (CRESTOR ) 5 MG tablet [id number] Yes TAKE 1 TABLET BY MOUTH ON MONDAY, WEDNESDAY AND FRIDAY AS DIRECTED Branch, Joyceann No, MD Taking Active   Semaglutide , 1 MG/DOSE, (OZEMPIC , 1 MG/DOSE,) 4 MG/3ML SOPN [id number] Yes Inject 1 mg into the skin once a week. Susanna Epley, FNP Taking Active   triamcinolone  ointment (KENALOG ) 0.5 % [id number]  Yes Apply 1 Application topically 2 (two) times daily. Susanna Epley, FNP Taking Active            Med Note Vandy Genera Oct 21, 2023 11:03 AM) As needed  valsartan  (DIOVAN ) 160 MG tablet [id number] Yes Take 1 tablet (160 mg total) by mouth daily. Diamond Formica, MD Taking Active               Assessment/Plan:   Diabetes: - Currently controlled A1c 6.7% up from 6.5% ( most likely due to being out of Ozempic ) -  Recommend to Continue current therapy  - Will send new prescription for Meter    Hypertension: - Currently controlled 124/70 at last PCP visit - Recommend to continue current therapy   Hyperlipidemia/ASCVD Risk Reduction: - Currently controlled.  On Rosuvastatin  three times per week. - Recommend to continue current therapy.   -Send in new prescription for an 84 day supply as all of his other chronic medications are filled for 90 days (This will help with adherence and less trips to the Pharmacy for the Patient.)  -  Follow Up Plan:    Follow up with Patient in 2 weeks to be sure he was able to get his new meter. Follow up with Patient in October about re-enrollment for Ozempic    Geronimo Krabbe, PharmD, [facility] Clinical Pharmacist [phone]

## 2023-11-07 ENCOUNTER — Other Ambulatory Visit: Payer: Self-pay | Admitting: Nurse Practitioner

## 2023-11-16 ENCOUNTER — Other Ambulatory Visit: Payer: Self-pay | Admitting: Nurse Practitioner

## 2023-12-22 ENCOUNTER — Other Ambulatory Visit: Payer: Self-pay | Admitting: Cardiology

## 2024-01-19 ENCOUNTER — Encounter: Payer: Self-pay | Admitting: Nurse Practitioner

## 2024-01-27 ENCOUNTER — Encounter: Admitting: Family Medicine

## 2024-02-03 ENCOUNTER — Ambulatory Visit (INDEPENDENT_AMBULATORY_CARE_PROVIDER_SITE_OTHER): Admitting: Family Medicine

## 2024-02-03 ENCOUNTER — Encounter: Payer: Self-pay | Admitting: Family Medicine

## 2024-02-03 VITALS — BP 116/80 | HR 88 | Temp 98.2°F | Ht 67.0 in | Wt 158.0 lb

## 2024-02-03 DIAGNOSIS — E1159 Type 2 diabetes mellitus with other circulatory complications: Secondary | ICD-10-CM

## 2024-02-03 DIAGNOSIS — Z9079 Acquired absence of other genital organ(s): Secondary | ICD-10-CM

## 2024-02-03 DIAGNOSIS — I110 Hypertensive heart disease with heart failure: Secondary | ICD-10-CM | POA: Diagnosis not present

## 2024-02-03 DIAGNOSIS — R110 Nausea: Secondary | ICD-10-CM

## 2024-02-03 DIAGNOSIS — Z8546 Personal history of malignant neoplasm of prostate: Secondary | ICD-10-CM

## 2024-02-03 DIAGNOSIS — Z0000 Encounter for general adult medical examination without abnormal findings: Secondary | ICD-10-CM | POA: Diagnosis not present

## 2024-02-03 DIAGNOSIS — I5032 Chronic diastolic (congestive) heart failure: Secondary | ICD-10-CM

## 2024-02-03 DIAGNOSIS — I25119 Atherosclerotic heart disease of native coronary artery with unspecified angina pectoris: Secondary | ICD-10-CM

## 2024-02-03 NOTE — Patient Instructions (Signed)
 Health Maintenance, Male  Adopting a healthy lifestyle and getting preventive care are important in promoting health and wellness. Ask your health care provider about:  The right schedule for you to have regular tests and exams.  Things you can do on your own to prevent diseases and keep yourself healthy.  What should I know about diet, weight, and exercise?  Eat a healthy diet    Eat a diet that includes plenty of vegetables, fruits, low-fat dairy products, and lean protein.  Do not eat a lot of foods that are high in solid fats, added sugars, or sodium.  Maintain a healthy weight  Body mass index (BMI) is a measurement that can be used to identify possible weight problems. It estimates body fat based on height and weight. Your health care provider can help determine your BMI and help you achieve or maintain a healthy weight.  Get regular exercise  Get regular exercise. This is one of the most important things you can do for your health. Most adults should:  Exercise for at least 150 minutes each week. The exercise should increase your heart rate and make you sweat (moderate-intensity exercise).  Do strengthening exercises at least twice a week. This is in addition to the moderate-intensity exercise.  Spend less time sitting. Even light physical activity can be beneficial.  Watch cholesterol and blood lipids  Have your blood tested for lipids and cholesterol at 74 years of age, then have this test every 5 years.  You may need to have your cholesterol levels checked more often if:  Your lipid or cholesterol levels are high.  You are older than 74 years of age.  You are at high risk for heart disease.  What should I know about cancer screening?  Many types of cancers can be detected early and may often be prevented. Depending on your health history and family history, you may need to have cancer screening at various ages. This may include screening for:  Colorectal cancer.  Prostate cancer.  Skin cancer.  Lung  cancer.  What should I know about heart disease, diabetes, and high blood pressure?  Blood pressure and heart disease  High blood pressure causes heart disease and increases the risk of stroke. This is more likely to develop in people who have high blood pressure readings or are overweight.  Talk with your health care provider about your target blood pressure readings.  Have your blood pressure checked:  Every 3-5 years if you are 24-52 years of age.  Every year if you are 3 years old or older.  If you are between the ages of 60 and 72 and are a current or former smoker, ask your health care provider if you should have a one-time screening for abdominal aortic aneurysm (AAA).  Diabetes  Have regular diabetes screenings. This checks your fasting blood sugar level. Have the screening done:  Once every three years after age 66 if you are at a normal weight and have a low risk for diabetes.  More often and at a younger age if you are overweight or have a high risk for diabetes.  What should I know about preventing infection?  Hepatitis B  If you have a higher risk for hepatitis B, you should be screened for this virus. Talk with your health care provider to find out if you are at risk for hepatitis B infection.  Hepatitis C  Blood testing is recommended for:  Everyone born from [birth year] through 1966.  Anyone  with known risk factors for hepatitis C.  Sexually transmitted infections (STIs)  You should be screened each year for STIs, including gonorrhea and chlamydia, if:  You are sexually active and are younger than 74 years of age.  You are older than 74 years of age and your health care provider tells you that you are at risk for this type of infection.  Your sexual activity has changed since you were last screened, and you are at increased risk for chlamydia or gonorrhea. Ask your health care provider if you are at risk.  Ask your health care provider about whether you are at high risk for HIV. Your health care provider  may recommend a prescription medicine to help prevent HIV infection. If you choose to take medicine to prevent HIV, you should first get tested for HIV. You should then be tested every 3 months for as long as you are taking the medicine.  Follow these instructions at home:  Alcohol use  Do not drink alcohol if your health care provider tells you not to drink.  If you drink alcohol:  Limit how much you have to 0-2 drinks a day.  Know how much alcohol is in your drink. In the [place] one drink equals one 12 oz bottle of beer (355 mL), one 5 oz glass of wine (148 mL), or one 1 oz glass of hard liquor (44 mL).  Lifestyle  Do not use any products that contain nicotine or tobacco. These products include cigarettes, chewing tobacco, and vaping devices, such as e-cigarettes. If you need help quitting, ask your health care provider.  Do not use street drugs.  Do not share needles.  Ask your health care provider for help if you need support or information about quitting drugs.  General instructions  Schedule regular health, dental, and eye exams.  Stay current with your vaccines.  Tell your health care provider if:  You often feel depressed.  You have ever been abused or do not feel safe at home.  Summary  Adopting a healthy lifestyle and getting preventive care are important in promoting health and wellness.  Follow your health care provider's instructions about healthy diet, exercising, and getting tested or screened for diseases.  Follow your health care provider's instructions on monitoring your cholesterol and blood pressure.  This information is not intended to replace advice given to you by your health care provider. Make sure you discuss any questions you have with your health care provider.  Document Revised: 10/08/2020 Document Reviewed: 10/08/2020  Elsevier Patient Education  2024 [employer].

## 2024-02-03 NOTE — Progress Notes (Signed)
 I,Jameka J Llittleton, CMA,acting as a Neurosurgeon for [employer], NP.,have documented all relevant documentation on the behalf of Ronald Creighton, NP,as directed by  Ronald Creighton, NP while in the presence of Ronald Creighton, NP.  Subjective:   Patient ID: Ronald Faulkner , male    DOB: 1951-02-18 , 73 y.o.   MRN: [id number]  Chief Complaint  Patient presents with   Annual Exam    Patient presents today for HM, patient reports complaince with medications. Patient denies any chest pain, SOB, or headaches. Patient reports he is concerned about his eating. He reports he feels sick after eating.     HPI Discussed the use of AI scribe software for clinical note transcription with the patient, who gave verbal consent to proceed.  History of Present Illness        Ronald Faulkner is a 73 year old male who presents for an annual physical exam.  He experiences intermittent nausea after eating, which began approximately one month ago. The nausea is not associated with any specific type of food, and there is no vomiting. He has a history of acid reflux but is unsure of the medications he takes for it.  He has diabetes managed with Ozempic  1 mg weekly on Mondays and metformin . He has been on this dose for over a year. He reports occasional constipation for which he takes Linzess  as needed. No significant issues with Ozempic  are reported.  He has a history of prostate cancer and underwent prostatectomy in 2017. His PSA levels have remained stable at 0.01 since the surgery.  He had his lower teeth extracted and dentures placed over a year ago, but continues to experience discomfort and difficulty eating due to the dentures. He uses adhesive to secure the dentures and is concerned about its impact on his health.  He reports swelling in his ankles and feet. He does not currently see a podiatrist.  He has a history of fish allergy but has been consuming small amounts of fish to build tolerance. He has not had a recent  allergic reaction.  He takes cholesterol medication three times a week (Monday, Wednesday, and Friday) and allopurinol , with about a week's supply remaining. He also takes Amlodipine , Imdur   and Metoprolol  for blood pressure management. He has a medical history of coronary artery disease and diastolic congestive heart failure but he is managed by Cardiology.   He denies any chest pain at this time states compliance with his medications and has no side effects.  He wants to come back for labs later.   Diabetes He presents for his follow-up diabetic visit. He has type 2 diabetes mellitus. No MedicAlert identification noted. His disease course has been improving. There are no hypoglycemic associated symptoms. There are no diabetic associated symptoms. Pertinent negatives for diabetes include no chest pain. There are no hypoglycemic complications. Symptoms are stable. There are no diabetic complications. Risk factors for coronary artery disease include diabetes mellitus, male sex, sedentary lifestyle and hypertension. Current diabetic treatment includes Ronald agent (dual therapy) (Ozempic  and metformin ). He is following a diabetic diet. When asked about meal planning, he reported none. He has not had a previous visit with a dietitian. He rarely participates in exercise. (Blood sugar ranging 92- 126) An ACE inhibitor/angiotensin II receptor blocker is being taken. Eye exam current: 08/22/2020.  Hypertension This is a chronic problem. The current episode started 1 to 4 weeks ago. The problem has been gradually worsening since onset. The problem is uncontrolled. Pertinent  negatives include no chest pain, palpitations or peripheral edema. There are no associated agents to hypertension. Risk factors for coronary artery disease include sedentary lifestyle. Past treatments include angiotensin blockers, calcium  channel blockers and direct vasodilators. The current treatment provides moderate improvement. There are no  compliance problems.  Hypertensive end-organ damage includes CAD/MI. There is no history of angina. There is no history of chronic renal disease or sleep apnea.     Past Medical History:  Diagnosis Date   Abnormal EKG 12/08/2011   Arthritis    Atypical chest pain 12/08/2011   CAD (coronary artery disease)    a. 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 Eda on 05/08/2021 -Pigmented   Unstable angina (HCC) 07/2017     Family History  Problem Relation Age of Onset   Colon cancer Mother    Coronary artery disease Maternal Grandmother    Anesthesia problems Neg Hx    Hypotension Neg Hx    Malignant hyperthermia Neg Hx    Pseudochol deficiency Neg Hx      Current Outpatient Medications:    allopurinol  (ZYLOPRIM ) 300 MG tablet, Take 1 tablet by mouth once daily, Disp: 90 tablet, Rfl: 0   amLODipine  (NORVASC ) 10 MG tablet, Take 1 tablet by mouth once daily, Disp: 90 tablet, Rfl: 2   aspirin  EC 81 MG tablet, Take 81 mg by mouth daily., Disp: , Rfl:    Blood Glucose Monitoring Suppl DEVI, Use to test blood sugar daily --May substitute to any manufacturer covered by patient's insurance., Disp: 1 each, Rfl: 0   diclofenac  Sodium (VOLTAREN ) 1 % GEL, Apply 2 g topically 4 (four) times daily., Disp: 50 g, Rfl: 1   ezetimibe  (ZETIA ) 10 MG tablet, Take 1 tablet by mouth once daily, Disp: 90 tablet, Rfl: 2    fluticasone  (FLONASE ) 50 MCG/ACT nasal spray, Place 2 sprays into both nostrils daily., Disp: , Rfl:    Glucose Blood (BLOOD GLUCOSE TEST STRIPS) STRP, Use to test blood sugar daily --May substitute to any manufacturer covered by patient's insurance., Disp: 100 strip, Rfl: 3   Insulin  Pen Needle 32G X 6 MM MISC, Use with ozempic , Disp: 50 each, Rfl: 3   isosorbide  mononitrate (IMDUR ) 60 MG 24 hr tablet, TAKE 1 & 1/2 (ONE & ONE-HALF) TABLETS BY MOUTH ONCE DAILY, Disp: 135 tablet, Rfl: 1   Lancet Device MISC, Use to test blood sugar daily--May substitute to any manufacturer covered by patient's insurance., Disp: 1 each, Rfl: 0   Lancets Misc. MISC, Use to test blood sugar daily--May substitute to any manufacturer covered by patient's insurance., Disp: 100 each, Rfl: 0   linaclotide  (LINZESS ) 72 MCG capsule, Take 72 mcg by mouth daily as needed., Disp: , Rfl:    metFORMIN  (GLUCOPHAGE ) 500 MG tablet, TAKE 1 TABLET BY MOUTH TWICE DAILY WITH A MEAL, Disp:  180 tablet, Rfl: 0   metoprolol  succinate (TOPROL -XL) 50 MG 24 hr tablet, Take 1 tablet by mouth once daily, Disp: 90 tablet, Rfl: 3   nitroGLYCERIN  (NITROSTAT ) 0.4 MG SL tablet, DISSOLVE ONE TABLET UNDER THE TONGUE EVERY 5 MINUTES AS NEEDED FOR CHEST PAIN.&nbsp;&nbsp;DO NOT EXCEED A TOTAL OF 3 DOSES IN 15 MINUTES, Disp: 25 tablet, Rfl: 0   oxymetazoline  (AFRIN) 0.05 % nasal spray, Place 1 spray into both nostrils 2 (two) times daily as needed for congestion., Disp: , Rfl:    rosuvastatin  (CRESTOR ) 5 MG tablet, Take 1 tablet on Mondays, Wednesdays, and Fridays, Disp: 36 tablet, Rfl: 3   Semaglutide , 1 MG/DOSE, (OZEMPIC , 1 MG/DOSE,) 4 MG/3ML SOPN, Inject 1 mg into the skin once a week., Disp: 9 mL, Rfl: 1   triamcinolone  ointment (KENALOG ) 0.5 %, Apply 1 Application topically 2 (two) times daily., Disp: 30 g, Rfl: 0   valsartan  (DIOVAN ) 160 MG tablet, Take 1 tablet (160 mg total) by mouth daily., Disp: 90 tablet, Rfl: 3   Allergies  Allergen Reactions    Bee Venom    Fish Allergy Hives and Swelling   Ivp Dye [Iodinated Contrast Media] Swelling      Flowsheet Row Office Visit from 02/03/2024 in [facility]  PHQ-2 Total Score 0   Social History   Substance and Sexual Activity  Alcohol Use Yes   Comment: occ   Social History   Tobacco Use  Smoking Status Former   Current packs/day: 0.00   Average packs/day: 1 pack/day for 35.0 years (35.0 ttl pk-yrs)   Types: Cigarettes   Start date: 06/02/1964   Quit date: 06/03/1999   Years since quitting: 24.7  Smokeless Tobacco Never  .   Review of Systems  Constitutional: Negative.   HENT: Negative.    Eyes: Negative.   Respiratory: Negative.    Cardiovascular: Negative.  Negative for chest pain, palpitations and leg swelling.  Gastrointestinal: Negative.   Endocrine: Negative.   Genitourinary: Negative.   Musculoskeletal: Negative.   Skin: Negative.   Neurological: Negative.   Hematological: Negative.   Psychiatric/Behavioral: Negative.       Today's Vitals   02/03/24 1545  BP: 116/80  Pulse: 88  Temp: 98.2 F (36.8 C)  TempSrc: Ronald  Weight: 158 lb (71.7 kg)  Height: 5' 7 (1.702 m)  PainSc: 0-No pain   Body mass index is 24.75 kg/m.  Wt Readings from Last 3 Encounters:  02/10/24 158 lb (71.7 kg)  02/03/24 158 lb (71.7 kg)  10/07/23 160 lb (72.6 kg)    Objective:  Physical Exam Constitutional:      Appearance: Normal appearance.  Cardiovascular:     Rate and Rhythm: Normal rate and regular rhythm.     Pulses: Normal pulses.     Heart sounds: Normal heart sounds.  Pulmonary:     Effort: Pulmonary effort is normal.     Breath sounds: Normal breath sounds.  Abdominal:     General: Bowel sounds are normal.  Musculoskeletal:        General: Normal range of motion.  Skin:    General: Skin is warm and dry.  Neurological:     General: No focal deficit present.     Mental Status: He is alert and oriented to person, place, and  time. Mental status is at baseline.  Psychiatric:        Mood and Affect: Mood normal.         Assessment And Plan:  Encounter for annual health examination  Type 2 diabetes mellitus with other circulatory complication, without long-term current use of insulin  (HCC) -     Hemoglobin A1c; Future  Hypertensive heart disease with chronic diastolic congestive heart failure (HCC) -     CBC; Future -     CMP14+EGFR; Future  Coronary artery disease involving native coronary artery of native heart with angina pectoris (HCC) -     Lipid panel; Future  Nausea without vomiting  H/O prostatectomy  H/O prostate cancer     Assessment & Plan Adult Wellness Visit Annual physical examination conducted with no immediate concerns identified.  Type 2 diabetes mellitus Diabetes management includes Ozempic  and metformin . Reports nausea potentially related to Ozempic . - Prescribe probiotic to address nausea. - Schedule follow-up for diabetes management in 3 to 6 months.  Hypertensive heart disease with heart failure No acute symptoms of heart failure reported.  Atherosclerotic heart disease of native coronary artery with angina No acute angina symptoms reported.  Nausea Intermittent nausea after eating, possibly related to Ozempic . Discussed potential fish allergy related to probiotic use. Agreed to try probiotic samples to assess for allergic reaction. - Provide probiotic samples to assess for allergic reaction.  Constipation Intermittent constipation managed with Linzess  as needed. Bowel movements not regular.    General Health Maintenance Eye exam recently completed. Colonoscopy not currently due. - Order labs for future visit.  Follow-Up Follow-up appointments discussed for diabetes management and annual physical. - Schedule follow-up for diabetes management in 3 to 6 months. - Schedule annual physical in one year. - Arrange for future lab work.   Return for 1 year  physical, 4 months DM. Patient was given opportunity to ask questions. Patient verbalized understanding of the plan and was able to repeat key elements of the plan. All questions were answered to their satisfaction.   I, Ronald Creighton, NP, have reviewed all documentation for this visit. The documentation on 02/15/2024 for the exam, diagnosis, procedures, and orders are all accurate and complete.

## 2024-02-10 ENCOUNTER — Encounter: Payer: Self-pay | Admitting: Cardiology

## 2024-02-10 ENCOUNTER — Ambulatory Visit: Attending: Cardiology | Admitting: Cardiology

## 2024-02-10 VITALS — BP 108/70 | HR 79 | Ht 67.0 in | Wt 158.0 lb

## 2024-02-10 DIAGNOSIS — I25119 Atherosclerotic heart disease of native coronary artery with unspecified angina pectoris: Secondary | ICD-10-CM

## 2024-02-10 DIAGNOSIS — E782 Mixed hyperlipidemia: Secondary | ICD-10-CM

## 2024-02-10 DIAGNOSIS — I10 Essential (primary) hypertension: Secondary | ICD-10-CM | POA: Diagnosis not present

## 2024-02-10 NOTE — Patient Instructions (Signed)

## 2024-02-10 NOTE — Progress Notes (Signed)
 Clinical Summary [name] is a 73 y.o.male seen today for follow up of the following medical problems.       1.CAD -history of DES x 2  to LAD in 07/2017 - 10/2019 cath: patent stents LAD, high grade diffuse disease small ramus, normal LCX, OM 50%, RCA 30% distal. LVEDP 20 - 07/2020 echo: LVEF 60-65%, no WMAs, grade I dd, normal RV function.  - 03/2021 nuclear stress: no ischemia - headaches on imdur  90mg  though would be willing to retry.         - no recent chest pains. No SOB/DOE - compliant withm eds     2.Hyperlipidemia - difficultly tolerating statin, on crestor  3 days a week and zetia   - he is on zetia  - Jan 2023 TC 167 TG 102 HDL 58 LDL 91. Appears was not taking zetia  at that time.    12/2021 TC 119 TG 103 HDL 49 LDL 51 09/2022 TC 128 TG 81 HDL 61 LDL 51 - 01/2023 TC 862 TG 90 HDL 59 LDL 61 - nonspecific muscle aches, unclear if related to crestor . Last visit we lowered dose, does not appear to have helped much, mainly lower back pains now that sound unrelated.   - upcoming labs with pcp   3. HTN - compliant with meds          2020 CT no aneurysm      SH: big [employer] fan Past Medical History:  Diagnosis Date   Abnormal EKG 12/08/2011   Arthritis    Atypical chest pain 12/08/2011   CAD (coronary artery disease)    a. 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  Eda on 05/08/2021 -Pigmented   Unstable angina (HCC) 07/2017     Allergies  Allergen Reactions   Bee Venom    Fish Allergy Hives and Swelling   Ivp Dye [Iodinated Contrast Media] Swelling     Current Outpatient Medications  Medication Sig Dispense Refill   allopurinol  (ZYLOPRIM ) 300 MG tablet Take 1 tablet by mouth once daily 90 tablet 0   amLODipine  (NORVASC ) 10 MG tablet Take 1 tablet by mouth once daily 90 tablet 2   aspirin  EC 81 MG tablet Take 81 mg by mouth daily.     Blood Glucose Monitoring Suppl DEVI Use to test blood sugar daily --May substitute to any manufacturer covered by patient's insurance. 1 each 0   diclofenac  Sodium (VOLTAREN ) 1 % GEL Apply 2 g topically 4 (four) times daily. 50 g 1   ezetimibe  (ZETIA ) 10 MG tablet Take 1 tablet by mouth once daily 90 tablet 2   fluticasone  (FLONASE ) 50 MCG/ACT nasal spray Place 2 sprays into both nostrils daily.     Glucose Blood (BLOOD GLUCOSE TEST STRIPS) STRP Use to test blood sugar daily --May substitute to any  manufacturer covered by [employer]. 100 strip 3   Insulin  Pen Needle 32G X 6 MM MISC Use with ozempic  50 each 3   isosorbide  mononitrate (IMDUR ) 60 MG 24 hr tablet TAKE 1 & 1/2 (ONE & ONE-HALF) TABLETS BY MOUTH ONCE DAILY 135 tablet 1   Lancet Device MISC Use to test blood sugar daily--May substitute to any manufacturer covered by [employer]. 1 each 0   Lancets Misc. MISC Use to test blood sugar daily--May substitute to any manufacturer covered by patient's insurance. 100 each 0   linaclotide  (LINZESS ) 72 MCG capsule Take 72 mcg by mouth daily as needed.     metFORMIN  (GLUCOPHAGE ) 500 MG tablet TAKE 1 TABLET BY MOUTH TWICE DAILY WITH A MEAL 180 tablet 0   metoprolol  succinate (TOPROL -XL) 50 MG 24 hr tablet Take 1 tablet by mouth once daily 90 tablet 3   nitroGLYCERIN  (NITROSTAT ) 0.4 MG SL tablet DISSOLVE ONE TABLET UNDER THE TONGUE EVERY 5 MINUTES AS NEEDED FOR CHEST PAIN.&nbsp;&nbsp;DO NOT EXCEED A  TOTAL OF 3 DOSES IN 15 MINUTES 25 tablet 0   oxymetazoline  (AFRIN) 0.05 % nasal spray Place 1 spray into both nostrils 2 (two) times daily as needed for congestion.     rosuvastatin  (CRESTOR ) 5 MG tablet Take 1 tablet on Mondays, Wednesdays, and Fridays 36 tablet 3   Semaglutide , 1 MG/DOSE, (OZEMPIC , 1 MG/DOSE,) 4 MG/3ML SOPN Inject 1 mg into the skin once a week. 9 mL 1   triamcinolone  ointment (KENALOG ) 0.5 % Apply 1 Application topically 2 (two) times daily. 30 g 0   valsartan  (DIOVAN ) 160 MG tablet Take 1 tablet (160 mg total) by mouth daily. 90 tablet 3   No current facility-administered medications for this visit.     Past Surgical History:  Procedure Laterality Date   ABDOMINAL SURGERY     APPENDECTOMY     BREAST SURGERY     benign lump at lt side   COLON SURGERY     COLONOSCOPY  03/14/2011   Procedure: COLONOSCOPY;  Surgeon: Claudis RAYMOND Rivet, MD;  [facility]: [facility];  Service: Endoscopy;  Laterality: N/A;  1:00   COLONOSCOPY N/A 11/15/2015   Procedure: COLONOSCOPY;  Surgeon: Claudis RAYMOND Rivet, MD;  Location: [facility];  Service: Endoscopy;  Laterality: N/A;  11:15   COLONOSCOPY WITH PROPOFOL  N/A 07/15/2019   Procedure: COLONOSCOPY WITH PROPOFOL ;  Surgeon: Rivet Claudis RAYMOND, MD;  Location: [facility];  Service: Endoscopy;  Laterality: N/A;  1235   CORONARY STENT INTERVENTION N/A 07/31/2017   Procedure: CORONARY STENT INTERVENTION;  Surgeon: Verlin Lonni BIRCH, MD;  Location: [facility];  Service: Cardiovascular;  Laterality: N/A;   LEFT HEART CATH AND CORONARY ANGIOGRAPHY N/A 07/31/2017   Procedure: LEFT HEART CATH AND CORONARY ANGIOGRAPHY;  Surgeon: Verlin Lonni BIRCH, MD;  Location: [facility];  Service: Cardiovascular;  Laterality: N/A;   LEFT HEART CATH AND CORONARY ANGIOGRAPHY N/A 10/28/2019   Procedure: LEFT HEART CATH AND CORONARY ANGIOGRAPHY;  Surgeon: Claudene Victory ORN, MD;  Location: [facility];  Service: Cardiovascular;  Laterality: N/A;    PENILE PROSTHESIS IMPLANT     2016    prostate cancer     2015. prostectomy     Allergies  Allergen Reactions   Bee Venom    Fish Allergy Hives and Swelling   Ivp Dye [Iodinated Contrast Media] Swelling      Family History  Problem Relation Age of Onset   Colon cancer Mother  Coronary artery disease Maternal Grandmother    Anesthesia problems Neg Hx    Hypotension Neg Hx    Malignant hyperthermia Neg Hx    Pseudochol deficiency Neg Hx      Social History Mr. Som reports that he quit smoking about 24 years ago. His smoking use included cigarettes. He started smoking about 59 years ago. He has a 35 pack-year smoking history. He has never used smokeless tobacco. Mr. Wolgamott reports current alcohol use.    Physical Examination Today's Vitals   02/10/24 0927  BP: 108/70  Pulse: 79  SpO2: 98%  Weight: 158 lb (71.7 kg)  Height: 5' 7 (1.702 m)   Body mass index is 24.75 kg/m.  Gen: resting comfortably, no acute distress HEENT: no scleral icterus, pupils equal round and reactive, no palptable cervical adenopathy,  CV: RRR, no mrg, no jvd Resp: Clear to auscultation bilaterally GI: abdomen is soft, non-tender, non-distended, normal bowel sounds, no hepatosplenomegaly MSK: extremities are warm, no edema.  Skin: warm, no rash Neuro:  no focal deficits Psych: appropriate affect   Diagnostic Studies 07/2017 cath Prox RCA to Mid RCA lesion is 20% stenosed. Dist RCA lesion is 50% stenosed. Dist Cx lesion is 60% stenosed. Ost Cx to Prox Cx lesion is 20% stenosed. Ost 1st Mrg to 1st Mrg lesion is 70% stenosed. Ost LAD to Prox LAD lesion is 20% stenosed. Mid LAD-1 lesion is 80% stenosed. Mid LAD-2 lesion is 95% stenosed. Dist LAD lesion is 70% stenosed. A drug-eluting stent was successfully placed using a STENT SIERRA 2.25 X 12 MM. Post intervention, there is a 0% residual stenosis. A drug-eluting stent was successfully placed using a STENT SIERRA 2.50 X 18  MM. Post intervention, there is a 0% residual stenosis.   1. Severe stenosis in the mid and distal LAD. Successful PTCA/DES x 1 distal LAD and PTCA/DES x 1 mid LAD.  2. Modeately severe stenosis in small caliber intermediate Ronald Faulkner.  3. Moderate stenosis in the distal RCA   Post cath Recommendations: Will continue DAPT with ASA and Plavix  for at least one year. Will continue beta blocker and statin. I would recommend medical management of the disease in the small caliber intermediate Ronald Faulkner.    10/2019 cath Patent left main Patent LAD mid and distal stent. High-grade diffuse obstructive disease in a branching relatively small ramus intermedius.  Slight progression compared to 2019. Widely patent circumflex with first obtuse marginal containing a 50% proximal narrowing Widely patent dominant right coronary with 30 to 40% narrowing distally. Normal LV function with EF 55%.  LVEDP 20 mmHg.   RECOMMENDATIONS:   Continue current therapy Further medication adjustments and management per Dr.,Konesweran     07/2020 echo 1. Left ventricular ejection fraction, by estimation, is 60 to 65%. The  left ventricle has normal function. The left ventricle has no regional  wall motion abnormalities. Left ventricular diastolic parameters are  consistent with Grade I diastolic  dysfunction (impaired relaxation).   2. Right ventricular systolic function is normal. The right ventricular  size is normal. Tricuspid regurgitation signal is inadequate for assessing  PA pressure.   3. The mitral valve is grossly normal. Trivial mitral valve  regurgitation.   4. The aortic valve is tricuspid. There is mild calcification of the  aortic valve. Aortic valve regurgitation is not visualized.   5. The inferior vena cava is normal in size with greater than 50%  respiratory variability, suggesting right atrial pressure of 3 mmHg.   Comparison(s): Echocardiogram done  10/13/18 showed an EF of 60-65%     03/2021  nuclear stress:  The study is normal. There are no perfusion defects consistent with prior infarct or current ischemia. The study is low risk.   No ST deviation was noted.   There is a moderate size moderate intensity inferior defect that is most intense in the resting images with normal wall motion. Findings consistent with diaphragmatic attenuation and artifact due to adjacet gut tracer uptake.   Left ventricular function is normal. Nuclear stress EF: 67 %. The left ventricular ejection fraction is hyperdynamic (>65%). End diastolic cavity size is normal.   10/2021 PFTs: minimal obstruction, moderate diffusion defect, +airtrapping        Assessment and Plan  1.CAD with unspecified angina - 2021 cath with stable disease including residual ramus disease, 03/2021 nuclear stress no ischemia -no recent symptoms, continue current meds   2. Hyperlipidemia - follow up upcoming labs with pcp, continue current meds   3. HTN - bp at goal, continue current meds   F/u 6 months   Dorn PHEBE Ross, M.D.

## 2024-02-15 DIAGNOSIS — Z8546 Personal history of malignant neoplasm of prostate: Secondary | ICD-10-CM | POA: Insufficient documentation

## 2024-02-15 DIAGNOSIS — I110 Hypertensive heart disease with heart failure: Secondary | ICD-10-CM | POA: Insufficient documentation

## 2024-02-15 NOTE — Assessment & Plan Note (Signed)
 Intermittent nausea after eating, possibly related to Ozempic .  Agreed to try probiotic samples to assess for allergic reaction. - Provide probiotic samples to assess for allergic reaction.

## 2024-02-15 NOTE — Assessment & Plan Note (Signed)
 Diabetes management includes Ozempic  and metformin . Continue ozempic  1 mg SQ weekly.

## 2024-02-15 NOTE — Assessment & Plan Note (Signed)
 Hypertensive heart disease with heart failure No acute symptoms of heart failure reported.

## 2024-02-15 NOTE — Assessment & Plan Note (Signed)
 Atherosclerotic heart disease of native coronary artery with angina No acute angina symptoms reported.

## 2024-02-15 NOTE — Assessment & Plan Note (Signed)
 Had prostate cancer in 2015 with prostatectomy

## 2024-02-18 ENCOUNTER — Other Ambulatory Visit: Payer: Self-pay | Admitting: Internal Medicine

## 2024-02-18 ENCOUNTER — Other Ambulatory Visit: Payer: Self-pay | Admitting: Nurse Practitioner

## 2024-02-18 ENCOUNTER — Other Ambulatory Visit: Payer: Self-pay | Admitting: Cardiology

## 2024-02-23 ENCOUNTER — Other Ambulatory Visit

## 2024-02-23 DIAGNOSIS — I110 Hypertensive heart disease with heart failure: Secondary | ICD-10-CM

## 2024-02-23 DIAGNOSIS — E1159 Type 2 diabetes mellitus with other circulatory complications: Secondary | ICD-10-CM

## 2024-02-23 DIAGNOSIS — I25119 Atherosclerotic heart disease of native coronary artery with unspecified angina pectoris: Secondary | ICD-10-CM

## 2024-02-24 LAB — LIPID PANEL
Chol/HDL Ratio: 2.4 ratio (ref 0.0–5.0)
Cholesterol, Total: 124 mg/dL (ref 100–199)
HDL: 51 mg/dL (ref >39)
LDL Chol Calc (NIH): 56 mg/dL (ref 0–99)
Triglycerides: 86 mg/dL (ref 0–149)
VLDL Cholesterol Cal: 17 mg/dL (ref 5–40)

## 2024-02-24 LAB — CBC
Hematocrit: 41.5 % (ref 37.5–51.0)
Hemoglobin: 13.2 g/dL (ref 13.0–17.7)
MCH: 27.6 pg (ref 26.6–33.0)
MCHC: 31.8 g/dL (ref 31.5–35.7)
MCV: 87 fL (ref 79–97)
Platelets: 195 x10E3/uL (ref 150–450)
RBC: 4.78 x10E6/uL (ref 4.14–5.80)
RDW: 14.7 % (ref 11.6–15.4)
WBC: 5.4 x10E3/uL (ref 3.4–10.8)

## 2024-02-24 LAB — CMP14+EGFR
ALT: 26 IU/L (ref 0–44)
AST: 36 IU/L (ref 0–40)
Albumin: 4.1 g/dL (ref 3.8–4.8)
Alkaline Phosphatase: 133 IU/L — ABNORMAL HIGH (ref 47–123)
BUN/Creatinine Ratio: 19 (ref 10–24)
BUN: 15 mg/dL (ref 8–27)
Bilirubin Total: 0.3 mg/dL (ref 0.0–1.2)
CO2: 19 mmol/L — ABNORMAL LOW (ref 20–29)
Calcium: 9.6 mg/dL (ref 8.6–10.2)
Chloride: 107 mmol/L — ABNORMAL HIGH (ref 96–106)
Creatinine, Ser: 0.78 mg/dL (ref 0.76–1.27)
Globulin, Total: 3.1 g/dL (ref 1.5–4.5)
Glucose: 106 mg/dL — ABNORMAL HIGH (ref 70–99)
Potassium: 3.5 mmol/L (ref 3.5–5.2)
Sodium: 144 mmol/L (ref 134–144)
Total Protein: 7.2 g/dL (ref 6.0–8.5)
eGFR: 94 mL/min/1.73 (ref >59)

## 2024-02-24 LAB — HEMOGLOBIN A1C
Est. average glucose Bld gHb Est-mCnc: 131 mg/dL
Hgb A1c MFr Bld: 6.2 % — ABNORMAL HIGH (ref 4.8–5.6)

## 2024-03-01 ENCOUNTER — Other Ambulatory Visit: Payer: Self-pay | Admitting: Cardiology

## 2024-03-01 ENCOUNTER — Other Ambulatory Visit: Payer: Self-pay | Admitting: Internal Medicine

## 2024-03-01 NOTE — Telephone Encounter (Signed)
 1. Which medications need to be refilled? (please list name of each medication and dose if known) Valsartan   2. Which pharmacy/location (including street and city if local pharmacy) is medication to be sent to? [facility] [place]  3. Do they need a 30 day or 90 day supply? 90   Pt is out

## 2024-03-01 NOTE — Telephone Encounter (Signed)
 Pt is requesting a refill on medication valsartan . Dr. Alvan did not prescribe this medication. Would Dr. Alvan like to refill this medication? Please address

## 2024-03-07 ENCOUNTER — Telehealth: Payer: Self-pay | Admitting: Cardiology

## 2024-03-07 ENCOUNTER — Ambulatory Visit: Attending: Cardiology | Admitting: *Deleted

## 2024-03-07 VITALS — BP 134/82 | HR 82

## 2024-03-07 DIAGNOSIS — I10 Essential (primary) hypertension: Secondary | ICD-10-CM

## 2024-03-07 NOTE — Telephone Encounter (Signed)
 See nurse visit information.

## 2024-03-07 NOTE — Telephone Encounter (Signed)
 Pt came into office and asked if someone could check his BP. When he checked it this morning it was 154/90.   He said he has been trying to get his blood pressure medicine for over a week and can't. Pharmacy keeps telling him its been denied.

## 2024-03-07 NOTE — Progress Notes (Unsigned)
 Patient walked into office requesting BP check & medication refill on his Valsartan .  BP 134/82  82  97%   Patient states that BP at home around 8:00 am was 154/90 but had not taken his morning medications yet.  Took meds shortly after.    BP log given to patient - suggested he keep log x 10-14 days.  Need to take readings 2 hours after medications with sitting 5-10 minutes prior.    Please advise if okay to refill his Valsartan .  This was initially given by Dr. Darlean.  Did not look like we have ever filled it.

## 2024-03-08 MED ORDER — VALSARTAN 160 MG PO TABS: 160.0000 mg | ORAL_TABLET | Freq: Every day | ORAL | 3 refills | Status: AC

## 2024-03-08 NOTE — Progress Notes (Signed)
 Patient notified and verbalized understanding.  Valsartan  160mg  daily refilled today - sent to [facility].

## 2024-03-16 ENCOUNTER — Encounter (INDEPENDENT_AMBULATORY_CARE_PROVIDER_SITE_OTHER): Payer: Self-pay | Admitting: Gastroenterology

## 2024-03-31 ENCOUNTER — Telehealth: Payer: Self-pay

## 2024-03-31 ENCOUNTER — Telehealth: Payer: Self-pay | Admitting: Pharmacist

## 2024-03-31 ENCOUNTER — Telehealth: Payer: Self-pay | Admitting: Nurse Practitioner

## 2024-03-31 DIAGNOSIS — E1165 Type 2 diabetes mellitus with hyperglycemia: Secondary | ICD-10-CM

## 2024-03-31 NOTE — Telephone Encounter (Signed)
 Copied from CRM [id number]. Topic: Clinical - Medication Question >> Mar 31, 2024  3:18 PM Myrick T wrote: Reason for CRM: patient called stated he opened his last pen of Ozempic  and there were bubbles in the pen. Patient was suppose to take his injection on Monday but did not because he was afraid. Please advise

## 2024-03-31 NOTE — Progress Notes (Signed)
   03/31/2024  Patient ID: Ronald Faulkner, male   DOB: 02/17/1951, 73 y.o.   MRN: [id number]  Received a message that the Patient called to inquire about Ozempic . He said he received a letter from [employer] saying Ozempic  would not be available through their patient assistance program.  Patient was called. HIPAA identifiers were obtained.  Patient said he went to use his last Ozempic  1 mg pen and when he went to inject it, he saw bubbles close to the needle. He said the thumped the side of the pen but did not feel comfortable using it.  [employer] patient assistance program was called. Patient has not had a shipment since their new process started with having to complete a form to get refills on Ozempic .  The representative said we submitted a renewal but it was not on the most recent form and they only received page one of the application versus two.  We were not notified.  The representative said the best thing would be to complete a new form so Ozempic  could be shipped before the end of the year.  We have some Ozempic  samples in the office. A note was sent to the Provider to set some aside for the Patient  Plan: Send notification to Luke Mall, CPhT about completing a new form.   [name], PharmD, BCACP Clinical Pharmacist [phone]

## 2024-03-31 NOTE — Telephone Encounter (Signed)
 Copied from CRM [id number]. Topic: Clinical - Medication Question >> Mar 31, 2024  3:14 PM Yolanda T wrote: Reason for CRM: patient called stated he recvd a letter stated he is no longer able to get Ozempic  through the program. Patient would like the pharmacist he has been working with to call him back to let him know if that is the true

## 2024-04-01 ENCOUNTER — Telehealth: Payer: Self-pay

## 2024-04-01 NOTE — Telephone Encounter (Signed)
 Completed refill form for Ozempic  and faxed to provider's office for review and signature.

## 2024-04-06 ENCOUNTER — Encounter: Payer: Self-pay | Admitting: Pharmacist

## 2024-04-06 NOTE — Progress Notes (Signed)
   04/06/2024  Patient ID: Ronald Faulkner, male   DOB: 11-27-1950, 73 y.o.   MRN: [id number]  Obtained signature for [employer] Refill/reorder form for Ozempic . Patient communicated understanding last week that Ozempic  will no longer be available through Patient Assistance Programming.  Plan: Fax to Luke Mall, CPhT for processing and scanning.  [name], PharmD, BCACP Clinical Pharmacist [phone]

## 2024-04-07 ENCOUNTER — Other Ambulatory Visit (HOSPITAL_COMMUNITY): Payer: Self-pay

## 2024-05-30 ENCOUNTER — Other Ambulatory Visit: Payer: Self-pay | Admitting: Nurse Practitioner

## 2024-06-02 ENCOUNTER — Other Ambulatory Visit: Payer: Self-pay | Admitting: Cardiology

## 2024-06-06 ENCOUNTER — Ambulatory Visit: Payer: Self-pay | Admitting: Nurse Practitioner

## 2024-06-06 ENCOUNTER — Encounter: Payer: Self-pay | Admitting: Nurse Practitioner

## 2024-06-06 VITALS — BP 121/78 | HR 87 | Ht 67.0 in | Wt 158.0 lb

## 2024-06-06 DIAGNOSIS — I25119 Atherosclerotic heart disease of native coronary artery with unspecified angina pectoris: Secondary | ICD-10-CM | POA: Diagnosis not present

## 2024-06-06 DIAGNOSIS — K5909 Other constipation: Secondary | ICD-10-CM

## 2024-06-06 DIAGNOSIS — E1159 Type 2 diabetes mellitus with other circulatory complications: Secondary | ICD-10-CM

## 2024-06-06 DIAGNOSIS — Z79899 Other long term (current) drug therapy: Secondary | ICD-10-CM

## 2024-06-06 DIAGNOSIS — I110 Hypertensive heart disease with heart failure: Secondary | ICD-10-CM | POA: Diagnosis not present

## 2024-06-06 DIAGNOSIS — Z7985 Long-term (current) use of injectable non-insulin antidiabetic drugs: Secondary | ICD-10-CM

## 2024-06-06 DIAGNOSIS — I5032 Chronic diastolic (congestive) heart failure: Secondary | ICD-10-CM | POA: Diagnosis not present

## 2024-06-06 DIAGNOSIS — Z7984 Long term (current) use of oral hypoglycemic drugs: Secondary | ICD-10-CM

## 2024-06-06 MED ORDER — OZEMPIC (1 MG/DOSE) 4 MG/3ML ~~LOC~~ SOPN: 1.0000 mg | PEN_INJECTOR | SUBCUTANEOUS | 1 refills | Status: AC

## 2024-06-06 MED ORDER — LINACLOTIDE 145 MCG PO CAPS: 145.0000 ug | ORAL_CAPSULE | Freq: Every day | ORAL | 1 refills | Status: AC

## 2024-06-06 NOTE — Assessment & Plan Note (Signed)
 Blood pressure is well controlled, continue current medications and follow up with Cardiology No acute symptoms of heart failure reported.

## 2024-06-06 NOTE — Patient Instructions (Signed)
 Hypertension, Adult Hypertension is another name for high blood pressure. High blood pressure forces your heart to work harder to pump blood. This can cause problems over time. There are two numbers in a blood pressure reading. There is a top number (systolic) over a bottom number (diastolic). It is best to have a blood pressure that is below 120/80. What are the causes? The cause of this condition is not known. Some other conditions can lead to high blood pressure. What increases the risk? Some lifestyle factors can make you more likely to develop high blood pressure: Smoking. Not getting enough exercise or physical activity. Being overweight. Having too much fat, sugar, calories, or salt (sodium) in your diet. Drinking too much alcohol. Other risk factors include: Having any of these conditions: Heart disease. Diabetes. High cholesterol. Kidney disease. Obstructive sleep apnea. Having a family history of high blood pressure and high cholesterol. Age. The risk increases with age. Stress. What are the signs or symptoms? High blood pressure may not cause symptoms. Very high blood pressure (hypertensive crisis) may cause: Headache. Fast or uneven heartbeats (palpitations). Shortness of breath. Nosebleed. Vomiting or feeling like you may vomit (nauseous). Changes in how you see. Very bad chest pain. Feeling dizzy. Seizures. How is this treated? This condition is treated by making healthy lifestyle changes, such as: Eating healthy foods. Exercising more. Drinking less alcohol. Your doctor may prescribe medicine if lifestyle changes do not help enough and if: Your top number is above 130. Your bottom number is above 80. Your personal target blood pressure may vary. Follow these instructions at home: Eating and drinking  If told, follow the DASH eating plan. To follow this plan: Fill one half of your plate at each meal with fruits and vegetables. Fill one fourth of your plate  at each meal with whole grains. Whole grains include whole-wheat pasta, brown rice, and whole-grain bread. Eat or drink low-fat dairy products, such as skim milk or low-fat yogurt. Fill one fourth of your plate at each meal with low-fat (lean) proteins. Low-fat proteins include fish, chicken without skin, eggs, beans, and tofu. Avoid fatty meat, cured and processed meat, or chicken with skin. Avoid pre-made or processed food. Limit the amount of salt in your diet to less than 1,500 mg each day. Do not drink alcohol if: Your doctor tells you not to drink. You are pregnant, may be pregnant, or are planning to become pregnant. If you drink alcohol: Limit how much you have to: 0-1 drink a day for women. 0-2 drinks a day for men. Know how much alcohol is in your drink. In the [place] one drink equals one 12 oz bottle of beer (355 mL), one 5 oz glass of wine (148 mL), or one 1 oz glass of hard liquor (44 mL). Lifestyle  Work with your doctor to stay at a healthy weight or to lose weight. Ask your doctor what the best weight is for you. Get at least 30 minutes of exercise that causes your heart to beat faster (aerobic exercise) most days of the week. This may include walking, swimming, or biking. Get at least 30 minutes of exercise that strengthens your muscles (resistance exercise) at least 3 days a week. This may include lifting weights or doing Pilates. Do not smoke or use any products that contain nicotine or tobacco. If you need help quitting, ask your doctor. Check your blood pressure at home as told by your doctor. Keep all follow-up visits. Medicines Take over-the-counter and prescription medicines  only as told by your doctor. Follow directions carefully. Do not skip doses of blood pressure medicine. The medicine does not work as well if you skip doses. Skipping doses also puts you at risk for problems. Ask your doctor about side effects or reactions to medicines that you should watch  for. Contact a doctor if: You think you are having a reaction to the medicine you are taking. You have headaches that keep coming back. You feel dizzy. You have swelling in your ankles. You have trouble with your vision. Get help right away if: You get a very bad headache. You start to feel mixed up (confused). You feel weak or numb. You feel faint. You have very bad pain in your: Chest. Belly (abdomen). You vomit more than once. You have trouble breathing. These symptoms may be an emergency. Get help right away. Call 911. Do not wait to see if the symptoms will go away. Do not drive yourself to the hospital. Summary Hypertension is another name for high blood pressure. High blood pressure forces your heart to work harder to pump blood. For most people, a normal blood pressure is less than 120/80. Making healthy choices can help lower blood pressure. If your blood pressure does not get lower with healthy choices, you may need to take medicine. This information is not intended to replace advice given to you by your health care provider. Make sure you discuss any questions you have with your health care provider. Document Revised: 03/07/2021 Document Reviewed: 03/07/2021 Elsevier Patient Education  2024 [employer].

## 2024-06-06 NOTE — Progress Notes (Signed)
 "  Virtual Visit via Video Note  I,Ronald Faulkner, CMA,acting as a scribe for Faulkner Ada, FNP.,have documented all relevant documentation on the behalf of Faulkner Ada, FNP,as directed by  Faulkner Ada, FNP while in the presence of Faulkner Ada, FNP.  I connected with Ronald Faulkner on 06/06/2024 at  8:20 AM EST by a video enabled telemedicine application and verified that I am speaking with the correct person using two identifiers.  Patient Location: Home Provider Location: Home Office  I discussed the limitations, risks, security, and privacy concerns of performing an evaluation and management service by video and the availability of in person appointments. I also discussed with the patient that there may be a patient responsible charge related to this service. The patient expressed understanding and agreed to proceed.  Subjective: PCP: Ronald Gaines, FNP  Chief Complaint  Patient presents with   Hypertension    Patient presents today for a bpc. Patient reports compliance with his meds. Patient denies having any chest pain, sob or headaches at this time. Patient reports he has not been feeling well, he is tired and sluggish. He also reports he is constipated.    Virtual visit to follow up on his blood pressure and diabetes. Failed virtual   The cut off the patient assistance for Ozempic  and his blood sugar went above 200 once. Ozempic  1mg  currently. Blood sugar is down to 114 after starting back on Ozempic . [facility] in [place] 6 months ago. He has not had the flu shot.   He is drinking 32-48 oz water  per day. He is not taking a fiber supplement but eats foods with fiber such as cereal, greens. He will go 2-3 days without a bowel movement.     Constipation This is a chronic problem. The patient is not on a high fiber diet. He Does not exercise regularly. There has Not been adequate water  intake.     ROS: Per HPI Current Medications[1]  Observations/Objective: Today's Vitals    06/06/24 0822  BP: 121/78  Pulse: 87  Weight: 158 lb (71.7 kg)  Height: 5' 7 (1.702 m)  PainSc: 0-No pain   Physical Exam Vitals reviewed.  Constitutional:      General: He is not in acute distress. Pulmonary:     Effort: Pulmonary effort is normal. No respiratory distress.     Comments: Speaking in complete sentences Neurological:     Mental Status: He is alert.  Psychiatric:        Mood and Affect: Mood normal.        Behavior: Behavior normal.        Thought Content: Thought content normal.        Judgment: Judgment normal.     Assessment and Plan: Hypertensive heart disease with chronic diastolic congestive heart failure (HCC) Assessment & Plan: Blood pressure is well controlled, continue current medications and follow up with Cardiology No acute symptoms of heart failure reported.  Orders: -     CMP14+EGFR; Future  Type 2 diabetes mellitus with other circulatory complication, without long-term current use of insulin  (HCC) Assessment & Plan: Diabetes management includes Ozempic  and metformin . Continue ozempic  1 mg SQ weekly he is no longer on patient assistance and will try to get medication from pharmacy, if too expensive will need to switch to more comparable GLP1 or oral agent (Jardiance).   Orders: -     CMP14+EGFR; Future -     Lipid panel; Future -     Hemoglobin A1c; Future -  Ozempic  (1 MG/DOSE); Inject 1 mg into the skin once a week.  Dispense: 9 mL; Refill: 1  Coronary artery disease involving native coronary artery of native heart with angina pectoris Assessment & Plan: Continue statin and follow up with Cardiology No acute angina symptoms reported.   Other long term (current) drug therapy -     CBC; Future  Chronic constipation Assessment & Plan: He has been taking linzess  off and on but the samples he has are old. I will send a Rx for Linzess  145 mcg daily and advised to take medication daily.   Orders: -     linaCLOtide ; Take 1 capsule  (145 mcg total) by mouth daily before breakfast.  Dispense: 90 capsule; Refill: 1   Offered influenza but unable to come on nurse visit day encouraged to get at local pharmacy as soon as possible with the high number of flu cases.  Follow Up Instructions: Return for controlled DM/BPC check-4 months.   I discussed the assessment and treatment plan with the patient. The patient was provided an opportunity to ask questions, and all were answered. The patient agreed with the plan and demonstrated an understanding of the instructions.   The patient was advised to call back or seek an in-person evaluation if the symptoms worsen or if the condition fails to improve as anticipated.  The above assessment and management plan was discussed with the patient. The patient verbalized understanding of and has agreed to the management plan.   Ronald Faulkner Ada, FNP, have reviewed all documentation for this visit. The documentation on 06/06/2024 for the exam, diagnosis, procedures, and orders are all accurate and complete.      [1]  Current Outpatient Medications:    Accu-Chek Softclix Lancets lancets, USE TO TEST BLOOD SUGAR DAILY, Disp: 100 each, Rfl: 0   allopurinol  (ZYLOPRIM ) 300 MG tablet, Take 1 tablet by mouth once daily, Disp: 90 tablet, Rfl: 0   amLODipine  (NORVASC ) 10 MG tablet, Take 1 tablet by mouth once daily, Disp: 90 tablet, Rfl: 3   aspirin  EC 81 MG tablet, Take 81 mg by mouth daily., Disp: , Rfl:    Blood Glucose Monitoring Suppl DEVI, Use to test blood sugar daily --May substitute to any manufacturer covered by patient's insurance., Disp: 1 each, Rfl: 0   diclofenac  Sodium (VOLTAREN ) 1 % GEL, Apply 2 g topically 4 (four) times daily., Disp: 50 g, Rfl: 1   ezetimibe  (ZETIA ) 10 MG tablet, Take 1 tablet by mouth once daily, Disp: 90 tablet, Rfl: 3   fluticasone  (FLONASE ) 50 MCG/ACT nasal spray, Place 2 sprays into both nostrils daily., Disp: , Rfl:    Glucose Blood (BLOOD GLUCOSE TEST STRIPS)  STRP, Use to test blood sugar daily --May substitute to any manufacturer covered by patient's insurance., Disp: 100 strip, Rfl: 3   Insulin  Pen Needle 32G X 6 MM MISC, Use with ozempic , Disp: 50 each, Rfl: 3   isosorbide  mononitrate (IMDUR ) 60 MG 24 hr tablet, TAKE 1 & 1/2 (ONE & ONE-HALF) TABLETS BY MOUTH ONCE DAILY, Disp: 135 tablet, Rfl: 3   Lancet Device MISC, Use to test blood sugar daily--May substitute to any manufacturer covered by patient's insurance., Disp: 1 each, Rfl: 0   Lancets Misc. MISC, Use to test blood sugar daily--May substitute to any manufacturer covered by patient's insurance., Disp: 100 each, Rfl: 0   metFORMIN  (GLUCOPHAGE ) 500 MG tablet, TAKE 1 TABLET BY MOUTH TWICE DAILY WITH A MEAL, Disp: 180 tablet, Rfl: 0   metoprolol   succinate (TOPROL -XL) 50 MG 24 hr tablet, Take 1 tablet by mouth once daily, Disp: 90 tablet, Rfl: 3   nitroGLYCERIN  (NITROSTAT ) 0.4 MG SL tablet, DISSOLVE ONE TABLET UNDER THE TONGUE EVERY 5 MINUTES AS NEEDED FOR CHEST PAIN.&nbsp;&nbsp;DO NOT EXCEED A TOTAL OF 3 DOSES IN 15 MINUTES, Disp: 25 tablet, Rfl: 0   oxymetazoline  (AFRIN) 0.05 % nasal spray, Place 1 spray into both nostrils 2 (two) times daily as needed for congestion., Disp: , Rfl:    rosuvastatin  (CRESTOR ) 5 MG tablet, Take 1 tablet on Mondays, Wednesdays, and Fridays, Disp: 36 tablet, Rfl: 3   triamcinolone  ointment (KENALOG ) 0.5 %, Apply 1 Application topically 2 (two) times daily., Disp: 30 g, Rfl: 0   valsartan  (DIOVAN ) 160 MG tablet, Take 1 tablet (160 mg total) by mouth daily., Disp: 90 tablet, Rfl: 3   linaclotide  (LINZESS ) 145 MCG CAPS capsule, Take 1 capsule (145 mcg total) by mouth daily before breakfast., Disp: 90 capsule, Rfl: 1   Semaglutide , 1 MG/DOSE, (OZEMPIC , 1 MG/DOSE,) 4 MG/3ML SOPN, Inject 1 mg into the skin once a week., Disp: 9 mL, Rfl: 1  "

## 2024-06-06 NOTE — Assessment & Plan Note (Signed)
 Continue statin and follow up with Cardiology No acute angina symptoms reported.

## 2024-06-06 NOTE — Assessment & Plan Note (Signed)
 He has been taking linzess  off and on but the samples he has are old. I will send a Rx for Linzess  145 mcg daily and advised to take medication daily.

## 2024-06-06 NOTE — Assessment & Plan Note (Signed)
 Diabetes management includes Ozempic  and metformin . Continue ozempic  1 mg SQ weekly he is no longer on patient assistance and will try to get medication from pharmacy, if too expensive will need to switch to more comparable GLP1 or oral agent (Jardiance).

## 2024-06-07 NOTE — Telephone Encounter (Signed)
 Refill form was processed for Ozempic  in October/ November and will close encounter.

## 2024-06-13 ENCOUNTER — Other Ambulatory Visit

## 2024-06-13 DIAGNOSIS — I110 Hypertensive heart disease with heart failure: Secondary | ICD-10-CM

## 2024-06-13 DIAGNOSIS — Z23 Encounter for immunization: Secondary | ICD-10-CM | POA: Diagnosis not present

## 2024-06-13 DIAGNOSIS — E1159 Type 2 diabetes mellitus with other circulatory complications: Secondary | ICD-10-CM

## 2024-06-13 DIAGNOSIS — Z79899 Other long term (current) drug therapy: Secondary | ICD-10-CM

## 2024-06-13 LAB — CBC
Hematocrit: 42.8 % (ref 37.5–51.0)
Hemoglobin: 13.4 g/dL (ref 13.0–17.7)
MCH: 27 pg (ref 26.6–33.0)
MCHC: 31.3 g/dL — ABNORMAL LOW (ref 31.5–35.7)
MCV: 86 fL (ref 79–97)
Platelets: 199 x10E3/uL (ref 150–450)
RBC: 4.96 x10E6/uL (ref 4.14–5.80)
RDW: 14.5 % (ref 11.6–15.4)
WBC: 5.5 x10E3/uL (ref 3.4–10.8)

## 2024-06-13 LAB — LIPID PANEL
Chol/HDL Ratio: 2.7 ratio (ref 0.0–5.0)
Cholesterol, Total: 145 mg/dL (ref 100–199)
HDL: 54 mg/dL
LDL Chol Calc (NIH): 71 mg/dL (ref 0–99)
Triglycerides: 113 mg/dL (ref 0–149)
VLDL Cholesterol Cal: 20 mg/dL (ref 5–40)

## 2024-06-13 LAB — CMP14+EGFR
ALT: 14 IU/L (ref 0–44)
AST: 16 IU/L (ref 0–40)
Albumin: 4.2 g/dL (ref 3.8–4.8)
Alkaline Phosphatase: 128 IU/L — ABNORMAL HIGH (ref 47–123)
BUN/Creatinine Ratio: 14 (ref 10–24)
BUN: 14 mg/dL (ref 8–27)
Bilirubin Total: 0.2 mg/dL (ref 0.0–1.2)
CO2: 21 mmol/L (ref 20–29)
Calcium: 10.1 mg/dL (ref 8.6–10.2)
Chloride: 107 mmol/L — ABNORMAL HIGH (ref 96–106)
Creatinine, Ser: 1.01 mg/dL (ref 0.76–1.27)
Globulin, Total: 2.9 g/dL (ref 1.5–4.5)
Glucose: 118 mg/dL — ABNORMAL HIGH (ref 70–99)
Potassium: 4 mmol/L (ref 3.5–5.2)
Sodium: 143 mmol/L (ref 134–144)
Total Protein: 7.1 g/dL (ref 6.0–8.5)
eGFR: 78 mL/min/1.73

## 2024-06-13 LAB — HEMOGLOBIN A1C
Est. average glucose Bld gHb Est-mCnc: 154 mg/dL
Hgb A1c MFr Bld: 7 % — ABNORMAL HIGH (ref 4.8–5.6)

## 2024-06-23 ENCOUNTER — Encounter (INDEPENDENT_AMBULATORY_CARE_PROVIDER_SITE_OTHER): Payer: Self-pay | Admitting: *Deleted

## 2024-08-04 ENCOUNTER — Ambulatory Visit: Admitting: Cardiology

## 2024-11-16 ENCOUNTER — Ambulatory Visit: Payer: Self-pay

## 2025-02-07 ENCOUNTER — Encounter: Payer: Self-pay | Admitting: Nurse Practitioner
